# Patient Record
Sex: Female | Born: 1950
Health system: Southern US, Community
[De-identification: ages and names within clinical notes are randomized; demographics above are authoritative.]

## PROBLEM LIST (undated history)

## (undated) ENCOUNTER — Emergency Department (HOSPITAL_COMMUNITY): Discharge status: Absent without leave | Payer: Medicare HMO

## (undated) DIAGNOSIS — T8859XA Other complications of anesthesia, initial encounter: Secondary | ICD-10-CM

## (undated) DIAGNOSIS — J439 Emphysema, unspecified: Secondary | ICD-10-CM

## (undated) DIAGNOSIS — K219 Gastro-esophageal reflux disease without esophagitis: Secondary | ICD-10-CM

## (undated) DIAGNOSIS — C159 Malignant neoplasm of esophagus, unspecified: Secondary | ICD-10-CM

## (undated) DIAGNOSIS — R112 Nausea with vomiting, unspecified: Secondary | ICD-10-CM

## (undated) DIAGNOSIS — I2510 Atherosclerotic heart disease of native coronary artery without angina pectoris: Secondary | ICD-10-CM

## (undated) DIAGNOSIS — H269 Unspecified cataract: Secondary | ICD-10-CM

## (undated) DIAGNOSIS — K5790 Diverticulosis of intestine, part unspecified, without perforation or abscess without bleeding: Secondary | ICD-10-CM

## (undated) DIAGNOSIS — E785 Hyperlipidemia, unspecified: Secondary | ICD-10-CM

## (undated) DIAGNOSIS — J189 Pneumonia, unspecified organism: Secondary | ICD-10-CM

## (undated) DIAGNOSIS — G4730 Sleep apnea, unspecified: Secondary | ICD-10-CM

## (undated) DIAGNOSIS — K220 Achalasia of cardia: Secondary | ICD-10-CM

## (undated) DIAGNOSIS — J449 Chronic obstructive pulmonary disease, unspecified: Secondary | ICD-10-CM

## (undated) DIAGNOSIS — K648 Other hemorrhoids: Secondary | ICD-10-CM

## (undated) DIAGNOSIS — H409 Unspecified glaucoma: Secondary | ICD-10-CM

## (undated) DIAGNOSIS — T4145XA Adverse effect of unspecified anesthetic, initial encounter: Secondary | ICD-10-CM

## (undated) DIAGNOSIS — I10 Essential (primary) hypertension: Secondary | ICD-10-CM

## (undated) DIAGNOSIS — Z9889 Other specified postprocedural states: Secondary | ICD-10-CM

## (undated) DIAGNOSIS — E119 Type 2 diabetes mellitus without complications: Secondary | ICD-10-CM

## (undated) HISTORY — DX: Unspecified glaucoma: H40.9

## (undated) HISTORY — DX: Diverticulosis of intestine, part unspecified, without perforation or abscess without bleeding: K57.90

## (undated) HISTORY — DX: Malignant neoplasm of esophagus, unspecified: C15.9

## (undated) HISTORY — PX: BREAST BIOPSY: SHX20

## (undated) HISTORY — DX: Hyperlipidemia, unspecified: E78.5

## (undated) HISTORY — PX: ABDOMINAL HYSTERECTOMY: SHX81

## (undated) HISTORY — DX: Gastro-esophageal reflux disease without esophagitis: K21.9

## (undated) HISTORY — PX: BREAST LUMPECTOMY: SHX2

## (undated) HISTORY — DX: Achalasia of cardia: K22.0

## (undated) HISTORY — DX: Sleep apnea, unspecified: G47.30

## (undated) HISTORY — DX: Essential (primary) hypertension: I10

## (undated) HISTORY — DX: Other hemorrhoids: K64.8

## (undated) HISTORY — DX: Unspecified cataract: H26.9

## (undated) HISTORY — DX: Type 2 diabetes mellitus without complications: E11.9

## (undated) HISTORY — DX: Chronic obstructive pulmonary disease, unspecified: J44.9

## (undated) HISTORY — DX: Emphysema, unspecified: J43.9

---

## 2014-07-06 LAB — HM MAMMOGRAPHY: HM Mammogram: NORMAL (ref 0–4)

## 2014-12-28 LAB — FECAL OCCULT BLOOD, GUAIAC: FECAL OCCULT BLD: NEGATIVE

## 2015-03-02 LAB — PULMONARY FUNCTION TEST

## 2015-03-22 LAB — HM COLONOSCOPY

## 2016-08-19 LAB — HM DIABETES EYE EXAM

## 2016-11-21 ENCOUNTER — Encounter: Payer: Self-pay | Admitting: Family Medicine

## 2016-11-21 ENCOUNTER — Other Ambulatory Visit: Payer: Self-pay

## 2016-11-21 ENCOUNTER — Ambulatory Visit: Payer: Medicare HMO | Admitting: Family Medicine

## 2016-11-21 VITALS — BP 138/80 | HR 100 | Temp 97.4°F | Resp 16 | Ht 61.5 in | Wt 154.0 lb

## 2016-11-21 DIAGNOSIS — Z8719 Personal history of other diseases of the digestive system: Secondary | ICD-10-CM | POA: Insufficient documentation

## 2016-11-21 DIAGNOSIS — J449 Chronic obstructive pulmonary disease, unspecified: Secondary | ICD-10-CM | POA: Insufficient documentation

## 2016-11-21 DIAGNOSIS — E782 Mixed hyperlipidemia: Secondary | ICD-10-CM | POA: Insufficient documentation

## 2016-11-21 DIAGNOSIS — I10 Essential (primary) hypertension: Secondary | ICD-10-CM | POA: Diagnosis not present

## 2016-11-21 DIAGNOSIS — E1169 Type 2 diabetes mellitus with other specified complication: Secondary | ICD-10-CM | POA: Insufficient documentation

## 2016-11-21 DIAGNOSIS — E119 Type 2 diabetes mellitus without complications: Secondary | ICD-10-CM | POA: Diagnosis not present

## 2016-11-21 DIAGNOSIS — K2100 Gastro-esophageal reflux disease with esophagitis, without bleeding: Secondary | ICD-10-CM | POA: Insufficient documentation

## 2016-11-21 DIAGNOSIS — Z1239 Encounter for other screening for malignant neoplasm of breast: Secondary | ICD-10-CM

## 2016-11-21 DIAGNOSIS — E1159 Type 2 diabetes mellitus with other circulatory complications: Secondary | ICD-10-CM | POA: Insufficient documentation

## 2016-11-21 DIAGNOSIS — Z23 Encounter for immunization: Secondary | ICD-10-CM

## 2016-11-21 DIAGNOSIS — Z1231 Encounter for screening mammogram for malignant neoplasm of breast: Secondary | ICD-10-CM

## 2016-11-21 DIAGNOSIS — E785 Hyperlipidemia, unspecified: Secondary | ICD-10-CM | POA: Diagnosis not present

## 2016-11-21 DIAGNOSIS — H269 Unspecified cataract: Secondary | ICD-10-CM

## 2016-11-21 DIAGNOSIS — J431 Panlobular emphysema: Secondary | ICD-10-CM

## 2016-11-21 DIAGNOSIS — R69 Illness, unspecified: Secondary | ICD-10-CM | POA: Diagnosis not present

## 2016-11-21 DIAGNOSIS — J439 Emphysema, unspecified: Secondary | ICD-10-CM | POA: Insufficient documentation

## 2016-11-21 DIAGNOSIS — I152 Hypertension secondary to endocrine disorders: Secondary | ICD-10-CM | POA: Insufficient documentation

## 2016-11-21 HISTORY — DX: Unspecified cataract: H26.9

## 2016-11-21 MED ORDER — ALBUTEROL SULFATE (2.5 MG/3ML) 0.083% IN NEBU: 2.5000 mg | INHALATION_SOLUTION | Freq: Four times a day (QID) | RESPIRATORY_TRACT | 3 refills | Status: DC | PRN

## 2016-11-21 MED ORDER — METFORMIN HCL 1000 MG PO TABS: 1000.0000 mg | ORAL_TABLET | Freq: Two times a day (BID) | ORAL | 3 refills | Status: DC

## 2016-11-21 MED ORDER — LOSARTAN POTASSIUM 25 MG PO TABS: 25.0000 mg | ORAL_TABLET | Freq: Every day | ORAL | 3 refills | Status: DC

## 2016-11-21 MED ORDER — FLUTICASONE FUROATE-VILANTEROL 100-25 MCG/INH IN AEPB: 1.0000 | INHALATION_SPRAY | Freq: Every day | RESPIRATORY_TRACT | 3 refills | Status: DC

## 2016-11-21 MED ORDER — OMEPRAZOLE 20 MG PO CPDR: 20.0000 mg | DELAYED_RELEASE_CAPSULE | Freq: Two times a day (BID) | ORAL | 3 refills | Status: DC

## 2016-11-21 MED ORDER — DOXYCYCLINE HYCLATE 100 MG PO TABS: 100.0000 mg | ORAL_TABLET | Freq: Two times a day (BID) | ORAL | 1 refills | Status: DC

## 2016-11-21 MED ORDER — PREDNISONE 20 MG PO TABS: 20.0000 mg | ORAL_TABLET | Freq: Two times a day (BID) | ORAL | 0 refills | Status: DC

## 2016-11-21 MED ORDER — GLIMEPIRIDE 4 MG PO TABS: 4.0000 mg | ORAL_TABLET | Freq: Every day | ORAL | 3 refills | Status: DC

## 2016-11-21 NOTE — Patient Instructions (Signed)
Push fluids Take the 5 d of antibiotics and prednisone Medicines will be refilled to pharmacy Mammogram ordered Referred to Eye doctor and the GI doctor  Need old records  Need labs and follow up in a month Call sooner for problems

## 2016-11-21 NOTE — Progress Notes (Signed)
Chief Complaint  Patient presents with  . Hypertension   Erica Braun is a retired 66 year old woman here for her first visit.  She and her husband just moved to the area from [place].  She has multiple medical problems, and no medical records.  She is a fairly good historian. She is a type II diabetic.  On oral medications.  She states her diabetes control has been good.  She denies any diabetic eye disease, kidney disease, or neuropathy.  She is up-to-date with with eye exam.  She states her last blood work was in June. She has hyperlipidemia.  She is not on a statin.  She states her doctor tried one, but it made her have body aches.  She was in the process of getting a second prescription, but moved.  She is willing to try another statin medication.  We will get lab work and evaluate. She has hypertension.  She is on an ARB medication.  Her blood pressure is well controlled. She has COPD.  Notes from many years of smoking.  She quit smoking a while ago.  She is compliant with her inhalers.  She tries to walk every day.  She states her lung function has improved since quitting smoking. She currently has an upper respiratory infection.  Is been going on for about 2 weeks.  She has coughing and chest congestion.  She is coughing up some scant yellow mucus.  No runny or stuffy nose, sore throat, or head cold symptoms.  She has not had any fever.  She is feeling more tired. She does have a history of GERD.  She states that the "acid has scarred my esophagus".  She has had to have strictures dilated on more than one occasion.  She states she is not having any swallowing difficulties right now. Her mother died of breast cancer.  She has 1 sister with breast cancer.  She is usually compliant with yearly mammograms, this year her mammogram was due in October.  She missed it in the move.  I will order a mammogram for her today.  She had a DEXA scan last year that was normal.  She had a colonoscopy 2 years ago.  It  seems she is up-to-date with her health care.  We will update her flu shot today.  She states she has had more than one pneumonia vaccination.  Patient Active Problem List   Diagnosis Date Noted  . Type 2 diabetes mellitus without complication, without long-term current use of insulin ([place]) 11/21/2016  . GERD with esophagitis 11/21/2016  . History of esophageal stricture 11/21/2016  . Essential hypertension 11/21/2016  . Hyperlipidemia LDL goal <70 11/21/2016  . COPD with emphysema ([place]) 11/21/2016  . Early cataracts, bilateral 11/21/2016    Outpatient Encounter Medications as of 11/21/2016  Medication Sig  . albuterol (PROVENTIL) (2.5 MG/3ML) 0.083% nebulizer solution Take 2.5 mg every 6 (six) hours as needed by nebulization for wheezing or shortness of breath.  . fluticasone furoate-vilanterol (BREO ELLIPTA) 100-25 MCG/INH AEPB Inhale 1 puff daily into the lungs.  Marland Kitchen glimepiride (AMARYL) 4 MG tablet Take 4 mg daily with breakfast by mouth.  . losartan (COZAAR) 25 MG tablet Take 25 mg daily by mouth.  . metFORMIN (GLUCOPHAGE) 1000 MG tablet Take 1,000 mg 2 (two) times daily with a meal by mouth.  Marland Kitchen omeprazole (PRILOSEC) 20 MG capsule Take 20 mg 2 (two) times daily before a meal by mouth.  . doxycycline (VIBRA-TABS) 100 MG tablet Take 1  tablet (100 mg total) 2 (two) times daily by mouth.  . predniSONE (DELTASONE) 20 MG tablet Take 1 tablet (20 mg total) 2 (two) times daily with a meal by mouth.   No facility-administered encounter medications on file as of 11/21/2016.     Past Medical History:  Diagnosis Date  . COPD (chronic obstructive pulmonary disease) ([place])   . Diabetes mellitus without complication ([place])   . Early cataracts, bilateral 11/21/2016  . Emphysema of lung ([place])   . GERD (gastroesophageal reflux disease)   . Glaucoma   . Hyperlipidemia   . Hypertension   . Sleep apnea     Past Surgical History:  Procedure Laterality Date  . ABDOMINAL HYSTERECTOMY     cervical  dysplasia    Social History   Socioeconomic History  . Marital status: Married    Spouse name: Jenny Reichmann  . Number of children: 3  . Years of education: 16  . Highest education level: Not on file  Social Needs  . Financial resource strain: Not hard at all  . Food insecurity - worry: Never true  . Food insecurity - inability: Never true  . Transportation needs - medical: No  . Transportation needs - non-medical: No  Occupational History  . Occupation: retired    Comment: Advertising account planner  Tobacco Use  . Smoking status: Former Smoker    Packs/day: 1.50    Types: Cigarettes    Start date: 1969    Last attempt to quit: 2007    Years since quitting: 11.8  . Smokeless tobacco: Never Used  Substance and Sexual Activity  . Alcohol use: No    Frequency: Never  . Drug use: No  . Sexual activity: Yes    Birth control/protection: Post-menopausal  Other Topics Concern  . Not on file  Social History Narrative   Recent move from [place] to walk   Married to [employer] dog at home    Family History  Problem Relation Age of Onset  . Cancer Mother        breast  . Arthritis Mother   . COPD Father 64       emphysema  . Alcohol abuse Father   . Diabetes Father   . Cancer Sister        breast  . Early death Brother        overdose  . Drug abuse Brother   . Alcohol abuse Paternal Aunt   . COPD Paternal 34   . Cancer Maternal Grandmother   . Diabetes Maternal Grandmother   . Alcohol abuse Maternal Grandfather   . Early death Maternal Grandfather   . Stroke Paternal Grandmother   . Heart disease Paternal Grandfather     Review of Systems  Constitutional: Positive for malaise/fatigue. Negative for chills, fever and weight loss.  HENT: Negative for congestion and hearing loss.   Eyes: Negative for blurred vision and pain.  Respiratory: Positive for cough, sputum production and shortness of breath.   Cardiovascular: Negative for chest pain and leg swelling.    Gastrointestinal: Negative for abdominal pain, constipation, diarrhea and heartburn.  Genitourinary: Negative for dysuria and frequency.  Musculoskeletal: Negative for falls, joint pain and myalgias.  Neurological: Negative for dizziness, seizures and headaches.  Psychiatric/Behavioral: Negative for depression. The patient is not nervous/anxious and does not have insomnia.     BP 138/80 (BP Location: Right Arm, Patient Position: Sitting, Cuff Size: Normal)   Pulse 100   Temp Marland Kitchen)  97.4 F (36.3 C) (Temporal)   Resp 16   Ht 5' 1.5" (1.562 m)   Wt 154 lb 0.6 oz (69.9 kg)   SpO2 94%   BMI 28.63 kg/m   Physical Exam  Constitutional: She is oriented to person, place, and time. She appears well-developed and well-nourished.  HENT:  Head: Normocephalic and atraumatic.  Right Ear: External ear normal.  Left Ear: External ear normal.  Mouth/Throat: Oropharynx is clear and moist.  Eyes: Conjunctivae are normal. Pupils are equal, round, and reactive to light.  Neck: Normal range of motion. Neck supple. No thyromegaly present.  Cardiovascular: Normal rate, regular rhythm and normal heart sounds.  Pulmonary/Chest: Effort normal. No respiratory distress.  rhonchi  Abdominal: Soft. Bowel sounds are normal.  Musculoskeletal: Normal range of motion. She exhibits no edema.  Lymphadenopathy:    She has no cervical adenopathy.  Neurological: She is alert and oriented to person, place, and time.  Gait normal  Skin: Skin is warm and dry.  Psychiatric: She has a normal mood and affect. Her behavior is normal. Thought content normal.  Nursing note and vitals reviewed. ASSESSMENT/PLAN:   1. Type 2 diabetes mellitus without complication, without long-term current use of insulin (HCC) - CBC - COMPLETE METABOLIC PANEL WITH GFR - Hemoglobin A1c - Lipid panel - VITAMIN D 25 Hydroxy (Vit-D Deficiency, Fractures) - Microalbumin / creatinine urine ratio - Urinalysis, Routine w reflex microscopic -  Ambulatory referral to Ophthalmology  2. GERD with esophagitis  - Ambulatory referral to Gastroenterology  3. History of esophageal stricture  - Ambulatory referral to Gastroenterology  4. Essential hypertension   5. Hyperlipidemia LDL goal <70   6. Panlobular emphysema ([place]) With exacerbation  7. Screening for breast cancer  - MM Digital Screening; Future  8. Early cataracts, bilateral  - Ambulatory referral to Ophthalmology  9. Needs flu shot  - Flu Vaccine QUAD 36+ mos IM   Patient Instructions  Push fluids Take the 5 d of antibiotics and prednisone Medicines will be refilled to pharmacy Mammogram ordered Referred to Eye doctor and the GI doctor  Need old records  Need labs and follow up in a month Call sooner for problems   Raylene Everts, MD

## 2016-11-22 LAB — COMPLETE METABOLIC PANEL WITH GFR
AG Ratio: 1.8 (calc) (ref 1.0–2.5)
ALBUMIN MSPROF: 4.2 g/dL (ref 3.6–5.1)
ALKALINE PHOSPHATASE (APISO): 102 U/L (ref 33–130)
ALT: 12 U/L (ref 6–29)
AST: 11 U/L (ref 10–35)
BILIRUBIN TOTAL: 0.4 mg/dL (ref 0.2–1.2)
BUN: 12 mg/dL (ref 7–25)
CHLORIDE: 100 mmol/L (ref 98–110)
CO2: 31 mmol/L (ref 20–32)
CREATININE: 0.56 mg/dL (ref 0.50–0.99)
Calcium: 9.3 mg/dL (ref 8.6–10.4)
GFR, Est African American: 112 mL/min/{1.73_m2} (ref >=60)
GFR, Est Non African American: 96 mL/min/{1.73_m2} (ref >=60)
GLUCOSE: 129 mg/dL (ref 65–139)
Globulin: 2.4 g/dL (calc) (ref 1.9–3.7)
Potassium: 4.1 mmol/L (ref 3.5–5.3)
Sodium: 138 mmol/L (ref 135–146)
TOTAL PROTEIN: 6.6 g/dL (ref 6.1–8.1)

## 2016-11-22 LAB — MICROALBUMIN / CREATININE URINE RATIO
Creatinine, Urine: 34 mg/dL (ref 20–275)
MICROALB UR: 0.2 mg/dL
MICROALB/CREAT RATIO: 6 ug/mg{creat} (ref <30)

## 2016-11-22 LAB — LIPID PANEL
CHOL/HDL RATIO: 3.6 (calc) (ref <5.0)
CHOLESTEROL: 195 mg/dL (ref <200)
HDL: 54 mg/dL (ref >50)
LDL CHOLESTEROL (CALC): 112 mg/dL — AB
Non-HDL Cholesterol (Calc): 141 mg/dL (calc) — ABNORMAL HIGH (ref <130)
TRIGLYCERIDES: 163 mg/dL — AB (ref <150)

## 2016-11-22 LAB — URINALYSIS, ROUTINE W REFLEX MICROSCOPIC
BILIRUBIN URINE: NEGATIVE
GLUCOSE, UA: NEGATIVE
HGB URINE DIPSTICK: NEGATIVE
KETONES UR: NEGATIVE
Leukocytes, UA: NEGATIVE
Nitrite: NEGATIVE
PROTEIN: NEGATIVE
Specific Gravity, Urine: 1.007 (ref 1.001–1.03)
pH: 7 (ref 5.0–8.0)

## 2016-11-22 LAB — HEMOGLOBIN A1C
HEMOGLOBIN A1C: 6 %{Hb} — AB (ref <5.7)
MEAN PLASMA GLUCOSE: 126 (calc)
eAG (mmol/L): 7 (calc)

## 2016-11-22 LAB — CBC
HCT: 33.6 % — ABNORMAL LOW (ref 35.0–45.0)
HEMOGLOBIN: 10.9 g/dL — AB (ref 11.7–15.5)
MCH: 28.3 pg (ref 27.0–33.0)
MCHC: 32.4 g/dL (ref 32.0–36.0)
MCV: 87.3 fL (ref 80.0–100.0)
MPV: 11 fL (ref 7.5–12.5)
PLATELETS: 177 10*3/uL (ref 140–400)
RBC: 3.85 10*6/uL (ref 3.80–5.10)
RDW: 13.9 % (ref 11.0–15.0)
WBC: 4.5 10*3/uL (ref 3.8–10.8)

## 2016-11-22 LAB — VITAMIN D 25 HYDROXY (VIT D DEFICIENCY, FRACTURES): VIT D 25 HYDROXY: 16 ng/mL — AB (ref 30–100)

## 2016-11-24 ENCOUNTER — Other Ambulatory Visit: Payer: Self-pay | Admitting: Family Medicine

## 2016-11-24 ENCOUNTER — Encounter: Payer: Self-pay | Admitting: Family Medicine

## 2016-11-24 DIAGNOSIS — D649 Anemia, unspecified: Secondary | ICD-10-CM

## 2016-12-19 ENCOUNTER — Encounter: Payer: Self-pay | Admitting: Family Medicine

## 2016-12-19 ENCOUNTER — Ambulatory Visit: Payer: Medicare HMO | Admitting: Family Medicine

## 2016-12-19 ENCOUNTER — Other Ambulatory Visit: Payer: Self-pay

## 2016-12-19 VITALS — BP 144/80 | HR 96 | Temp 97.4°F | Resp 16 | Ht 62.0 in | Wt 153.1 lb

## 2016-12-19 DIAGNOSIS — D649 Anemia, unspecified: Secondary | ICD-10-CM

## 2016-12-19 DIAGNOSIS — E119 Type 2 diabetes mellitus without complications: Secondary | ICD-10-CM

## 2016-12-19 DIAGNOSIS — E785 Hyperlipidemia, unspecified: Secondary | ICD-10-CM | POA: Diagnosis not present

## 2016-12-19 DIAGNOSIS — Z9989 Dependence on other enabling machines and devices: Secondary | ICD-10-CM

## 2016-12-19 DIAGNOSIS — D509 Iron deficiency anemia, unspecified: Secondary | ICD-10-CM | POA: Diagnosis not present

## 2016-12-19 DIAGNOSIS — G4733 Obstructive sleep apnea (adult) (pediatric): Secondary | ICD-10-CM

## 2016-12-19 DIAGNOSIS — I10 Essential (primary) hypertension: Secondary | ICD-10-CM | POA: Diagnosis not present

## 2016-12-19 LAB — CBC WITH DIFFERENTIAL/PLATELET
BASOS ABS: 18 {cells}/uL (ref 0–200)
BASOS PCT: 0.4 %
EOS ABS: 69 {cells}/uL (ref 15–500)
EOS PCT: 1.5 %
HEMATOCRIT: 34 % — AB (ref 35.0–45.0)
HEMOGLOBIN: 11.3 g/dL — AB (ref 11.7–15.5)
LYMPHS ABS: 1049 {cells}/uL (ref 850–3900)
MCH: 28.6 pg (ref 27.0–33.0)
MCHC: 33.2 g/dL (ref 32.0–36.0)
MCV: 86.1 fL (ref 80.0–100.0)
MONOS PCT: 7.6 %
MPV: 11.6 fL (ref 7.5–12.5)
NEUTROS ABS: 3114 {cells}/uL (ref 1500–7800)
Neutrophils Relative %: 67.7 %
Platelets: 162 10*3/uL (ref 140–400)
RBC: 3.95 10*6/uL (ref 3.80–5.10)
RDW: 13.6 % (ref 11.0–15.0)
Total Lymphocyte: 22.8 %
WBC mixed population: 350 cells/uL (ref 200–950)
WBC: 4.6 10*3/uL (ref 3.8–10.8)

## 2016-12-19 MED ORDER — ATORVASTATIN CALCIUM 20 MG PO TABS: 20.0000 mg | ORAL_TABLET | Freq: Every day | ORAL | 3 refills | Status: DC

## 2016-12-19 NOTE — Progress Notes (Signed)
Chief Complaint  Patient presents with  . Follow-up    1 month  Patient is here for one-month follow-up. She tells me that she forgot to tell me at her last visit that she had obstructive sleep apnea and uses CPAP.  When she moved, her insurance changed, and her old insurance company took back her CPAP machine.  I need a copy of her sleep study so that I can reorder her CPAP. We discussed her recent laboratory test.  Her diabetes is well controlled.  Her blood pressure today is a little elevated.  She states that  she did not take her blood pressure medicine this morning.    She is not on a statin.  Her LDL is 112.  I let her know today that I believe she should be taking a cholesterol medication for risk reduction.  Liver and kidney functions were good. Her blood work did reveal anemia.  Her hemoglobin was just over 10.  She has not been anemic in the past.  She had a colonoscopy 2 or 3 years ago.  She had an endoscopy at the same time.  She does have a history of GERD and esophagitis.  Going to send her for additional studies to check her iron, check for blood in stool, recheck a CBC.  This needs additional workup.   Patient Active Problem List   Diagnosis Date Noted  . OSA on CPAP 12/19/2016  . Type 2 diabetes mellitus without complication, without long-term current use of insulin ([place]) 11/21/2016  . GERD with esophagitis 11/21/2016  . History of esophageal stricture 11/21/2016  . Essential hypertension 11/21/2016  . Hyperlipidemia LDL goal <70 11/21/2016  . COPD with emphysema ([place]) 11/21/2016  . Early cataracts, bilateral 11/21/2016    Outpatient Encounter Medications as of 12/19/2016  Medication Sig  . albuterol (PROVENTIL) (2.5 MG/3ML) 0.083% nebulizer solution Take 3 mLs (2.5 mg total) every 6 (six) hours as needed by nebulization for wheezing or shortness of breath.  . fluticasone furoate-vilanterol (BREO ELLIPTA) 100-25 MCG/INH AEPB Inhale 1 puff daily into the lungs.  Marland Kitchen  glimepiride (AMARYL) 4 MG tablet Take 1 tablet (4 mg total) daily with breakfast by mouth.  . losartan (COZAAR) 25 MG tablet Take 1 tablet (25 mg total) daily by mouth.  . metFORMIN (GLUCOPHAGE) 1000 MG tablet Take 1 tablet (1,000 mg total) 2 (two) times daily with a meal by mouth.  Marland Kitchen omeprazole (PRILOSEC) 20 MG capsule Take 1 capsule (20 mg total) 2 (two) times daily before a meal by mouth.   No facility-administered encounter medications on file as of 12/19/2016.     No Known Allergies  Review of Systems  Constitutional: Negative for activity change, appetite change and unexpected weight change.  HENT: Negative for congestion, dental problem, postnasal drip and rhinorrhea.   Eyes: Negative for redness and visual disturbance.  Respiratory: Negative for cough and shortness of breath.        Mild dyspnea is normal for her  Cardiovascular: Negative for chest pain, palpitations and leg swelling.  Gastrointestinal: Negative for abdominal pain, constipation and diarrhea.  Genitourinary: Negative for difficulty urinating and frequency.  Musculoskeletal: Negative for arthralgias and back pain.  Neurological: Negative for dizziness and headaches.  Psychiatric/Behavioral: Negative for dysphoric mood and sleep disturbance. The patient is not nervous/anxious.     BP (!) 144/80 (BP Location: Left Arm, Patient Position: Sitting, Cuff Size: Normal)   Pulse 96   Temp (!) 97.4 F (36.3 C) (Temporal)  Resp 16   Ht 5\' 2"  (1.575 m)   Wt 153 lb 1.9 oz (69.5 kg)   SpO2 95%   BMI 28.01 kg/m   Physical Exam  Constitutional: She is oriented to person, place, and time. She appears well-developed and well-nourished.  HENT:  Head: Normocephalic and atraumatic.  Mouth/Throat: Oropharynx is clear and moist.  Eyes: Conjunctivae are normal. Pupils are equal, round, and reactive to light.  Neck: Normal range of motion. Neck supple. No thyromegaly present.  Cardiovascular: Normal rate, regular rhythm and  normal heart sounds.  Pulmonary/Chest: Effort normal. No respiratory distress. She has no wheezes.  Abdominal: Soft. Bowel sounds are normal.  Musculoskeletal: Normal range of motion. She exhibits no edema.  Lymphadenopathy:    She has no cervical adenopathy.  Neurological: She is alert and oriented to person, place, and time.  Gait normal  Skin: Skin is warm and dry.  Psychiatric: She has a normal mood and affect. Her behavior is normal. Thought content normal.  Nursing note and vitals reviewed.   ASSESSMENT/PLAN:  1. Microcytic anemia Iron panel - CBC with Differential/Platelet - [place] Stool Card-TAKE HOME  2. Type 2 diabetes mellitus without complication, without long-term current use of insulin ([place]) Well-controlled  3. OSA on CPAP Need old records  4. Essential hypertension Patient did not take her blood pressure medicine today.  It is mildly elevated.  She is reminded to take it every morning. 5.  Hyperlipidemia. Prescription for Lipitor 20 sent to her pharmacy  Patient Instructions  Need copy of your old sleep test Still need old PCP records Mammogram ordered Continue the vitamin D Need additional labs today for the anemia follow up See me in three months I will let you know the lab tests sooner and any additional tests needed We will check for blood in the stool    Raylene Everts, MD

## 2016-12-19 NOTE — Patient Instructions (Addendum)
Need copy of your old sleep test Still need old PCP records Mammogram ordered Continue the vitamin D Need additional labs today for the anemia follow up See me in three months I will let you know the lab tests sooner and any additional tests needed We will check for blood in the stool

## 2016-12-23 ENCOUNTER — Telehealth: Payer: Self-pay

## 2016-12-23 NOTE — Progress Notes (Signed)
Called and left message for gage, she will need to return to the lab for additional lab work.

## 2016-12-23 NOTE — Telephone Encounter (Signed)
-----   Message from Raylene Everts, MD sent at 12/23/2016 11:11 AM EST ----- The lab did it again, only the tests ordered that visit and not the ones I signed a few days earlier.  I NEED both the iron studies, retic count AND the CBC.  Please call and see if lab can add.

## 2016-12-24 DIAGNOSIS — D649 Anemia, unspecified: Secondary | ICD-10-CM | POA: Diagnosis not present

## 2016-12-24 NOTE — Addendum Note (Signed)
Addended by: Ova Freshwater on: 12/24/2016 11:56 AM   Modules accepted: Orders

## 2016-12-24 NOTE — Telephone Encounter (Signed)
Pt is in the waiting room

## 2016-12-25 LAB — IRON,TIBC AND FERRITIN PANEL
%SAT: 27 % (ref 11–50)
Ferritin: 36 ng/mL (ref 20–288)
Iron: 85 ug/dL (ref 45–160)
TIBC: 313 mcg/dL (calc) (ref 250–450)

## 2016-12-25 LAB — RETICULOCYTES
ABS RETIC: 88660 {cells}/uL — AB (ref 20000–8000)
RETIC CT PCT: 2.2 %

## 2016-12-25 NOTE — Progress Notes (Signed)
Labs drawn 12 19 18

## 2016-12-29 DIAGNOSIS — R69 Illness, unspecified: Secondary | ICD-10-CM | POA: Diagnosis not present

## 2017-01-02 ENCOUNTER — Encounter: Payer: Self-pay | Admitting: Family Medicine

## 2017-01-07 ENCOUNTER — Other Ambulatory Visit: Payer: Self-pay

## 2017-03-06 ENCOUNTER — Encounter: Payer: Self-pay | Admitting: Family Medicine

## 2017-03-09 ENCOUNTER — Other Ambulatory Visit: Payer: Self-pay

## 2017-03-09 DIAGNOSIS — Z1239 Encounter for other screening for malignant neoplasm of breast: Secondary | ICD-10-CM

## 2017-03-17 DIAGNOSIS — H5203 Hypermetropia, bilateral: Secondary | ICD-10-CM | POA: Diagnosis not present

## 2017-03-17 DIAGNOSIS — H52223 Regular astigmatism, bilateral: Secondary | ICD-10-CM | POA: Diagnosis not present

## 2017-03-17 DIAGNOSIS — H2513 Age-related nuclear cataract, bilateral: Secondary | ICD-10-CM | POA: Diagnosis not present

## 2017-03-17 DIAGNOSIS — H524 Presbyopia: Secondary | ICD-10-CM | POA: Diagnosis not present

## 2017-03-17 DIAGNOSIS — E119 Type 2 diabetes mellitus without complications: Secondary | ICD-10-CM | POA: Diagnosis not present

## 2017-03-17 DIAGNOSIS — H40013 Open angle with borderline findings, low risk, bilateral: Secondary | ICD-10-CM | POA: Diagnosis not present

## 2017-03-17 DIAGNOSIS — H25013 Cortical age-related cataract, bilateral: Secondary | ICD-10-CM | POA: Diagnosis not present

## 2017-03-17 DIAGNOSIS — Z7984 Long term (current) use of oral hypoglycemic drugs: Secondary | ICD-10-CM | POA: Diagnosis not present

## 2017-03-18 ENCOUNTER — Encounter: Payer: Self-pay | Admitting: Family Medicine

## 2017-03-18 ENCOUNTER — Other Ambulatory Visit: Payer: Self-pay

## 2017-03-18 ENCOUNTER — Ambulatory Visit (INDEPENDENT_AMBULATORY_CARE_PROVIDER_SITE_OTHER): Payer: Medicare HMO | Admitting: Family Medicine

## 2017-03-18 VITALS — BP 132/82 | HR 106 | Temp 97.9°F | Ht 62.0 in | Wt 156.2 lb

## 2017-03-18 DIAGNOSIS — J431 Panlobular emphysema: Secondary | ICD-10-CM | POA: Diagnosis not present

## 2017-03-18 DIAGNOSIS — Z23 Encounter for immunization: Secondary | ICD-10-CM

## 2017-03-18 DIAGNOSIS — Z9989 Dependence on other enabling machines and devices: Secondary | ICD-10-CM

## 2017-03-18 DIAGNOSIS — I10 Essential (primary) hypertension: Secondary | ICD-10-CM

## 2017-03-18 DIAGNOSIS — E785 Hyperlipidemia, unspecified: Secondary | ICD-10-CM | POA: Diagnosis not present

## 2017-03-18 DIAGNOSIS — E119 Type 2 diabetes mellitus without complications: Secondary | ICD-10-CM | POA: Diagnosis not present

## 2017-03-18 DIAGNOSIS — K2100 Gastro-esophageal reflux disease with esophagitis, without bleeding: Secondary | ICD-10-CM

## 2017-03-18 DIAGNOSIS — G4733 Obstructive sleep apnea (adult) (pediatric): Secondary | ICD-10-CM

## 2017-03-18 MED ORDER — PNEUMOCOCCAL 13-VAL CONJ VACC IM SUSP: 0.5000 mL | Freq: Once | INTRAMUSCULAR | Status: DC

## 2017-03-18 NOTE — Progress Notes (Signed)
Chief Complaint  Patient presents with  . Follow-up    cpap questions, GI appointment next week, burning in chest  Patient questions the status of her CPAP supplies.  I told her I still do not have the necessary sleep study information in order to submit this to insurance.  She states she will call again to see if she can get a copy of this. She does have a GI appointment next week.  She has a history of esophagitis and esophageal stricture.  She was found on her recent blood work to have anemia.  I feel she needs another endoscopy.  Her colonoscopy is up-to-date.  She has not noticed any change in her appetite, GI distress, blood in her bowels, constipation or diarrhea.  She states she still has moderate difficulty swallowing at times.  She feels this is normal for her. Patient has COPD.  She has GERD.  She has some "burning" in her chest.  She states she feels this when she "runs up the stairs fast".  She does not think it is cardiac.  She does not have any associated symptoms, radiation, nausea, diaphoresis.  She states it feels "like I did when I used to run when I was younger.  She is going to have a GI workup soon.  I did an EKG today that is negative.  I did explain to her that if it persists we might need to do additional lung or GI testing. She is compliant with her Brio.  She does not have shortness of breath, cough or sputum that is worrisome. Her diabetes is well controlled. Blood pressures well controlled. She is compliant with her statin. She is overdue for her mammogram. She states she is certain her shots are up-to-date including Prevnar/Pneumovax She has not had shingles shot.  She is not interested in it.  Discussed the risks benefits pros and cons.  Patient Active Problem List   Diagnosis Date Noted  . OSA on CPAP 12/19/2016  . Type 2 diabetes mellitus without complication, without long-term current use of insulin ([place]) 11/21/2016  . GERD with esophagitis 11/21/2016  .  History of esophageal stricture 11/21/2016  . Essential hypertension 11/21/2016  . Hyperlipidemia LDL goal <70 11/21/2016  . COPD with emphysema ([place]) 11/21/2016  . Early cataracts, bilateral 11/21/2016    Outpatient Encounter Medications as of 03/18/2017  Medication Sig  . albuterol (PROVENTIL) (2.5 MG/3ML) 0.083% nebulizer solution Take 3 mLs (2.5 mg total) every 6 (six) hours as needed by nebulization for wheezing or shortness of breath.  Marland Kitchen atorvastatin (LIPITOR) 20 MG tablet Take 1 tablet (20 mg total) by mouth daily.  . fluticasone furoate-vilanterol (BREO ELLIPTA) 100-25 MCG/INH AEPB Inhale 1 puff daily into the lungs.  Marland Kitchen glimepiride (AMARYL) 4 MG tablet Take 1 tablet (4 mg total) daily with breakfast by mouth.  . metFORMIN (GLUCOPHAGE) 1000 MG tablet Take 1 tablet (1,000 mg total) 2 (two) times daily with a meal by mouth.  Marland Kitchen omeprazole (PRILOSEC) 20 MG capsule Take 1 capsule (20 mg total) 2 (two) times daily before a meal by mouth.  . losartan (COZAAR) 25 MG tablet Take 1 tablet (25 mg total) daily by mouth. (Patient not taking: Reported on 03/18/2017)   No facility-administered encounter medications on file as of 03/18/2017.     No Known Allergies  Review of Systems  Constitutional: Negative for activity change, appetite change and unexpected weight change.  HENT: Negative for congestion, dental problem, postnasal drip and rhinorrhea.  Eyes: Negative for redness and visual disturbance.  Respiratory: Negative for cough and shortness of breath.        Mild dyspnea is normal for her.  No change in symptoms.  Chest burning with increased exertion.  Cardiovascular: Negative for chest pain, palpitations and leg swelling.  Gastrointestinal: Negative for abdominal pain, constipation and diarrhea.  Genitourinary: Negative for difficulty urinating and frequency.  Musculoskeletal: Negative for arthralgias and back pain.  Neurological: Negative for dizziness and headaches.    Psychiatric/Behavioral: Positive for sleep disturbance. Negative for dysphoric mood. The patient is not nervous/anxious.     BP 132/82 (BP Location: Right Arm, Patient Position: Sitting, Cuff Size: Normal)   Pulse (!) 106   Temp 97.9 F (36.6 C) (Temporal)   Ht 5\' 2"  (1.575 m)   Wt 156 lb 4 oz (70.9 kg)   SpO2 96%   BMI 28.58 kg/m   Physical Exam  Constitutional: She is oriented to person, place, and time. She appears well-developed and well-nourished.  HENT:  Head: Normocephalic and atraumatic.  Mouth/Throat: Oropharynx is clear and moist.  Dentures  Eyes: Conjunctivae are normal. Pupils are equal, round, and reactive to light.  Neck: Normal range of motion. Neck supple. No thyromegaly present.  Cardiovascular: Normal rate, regular rhythm and normal heart sounds.  Pulmonary/Chest: Effort normal. No respiratory distress. She has no wheezes.  Clear  Abdominal: Soft. Bowel sounds are normal.  Musculoskeletal: Normal range of motion. She exhibits no edema.  Lymphadenopathy:    She has no cervical adenopathy.  Neurological: She is alert and oriented to person, place, and time.  Gait normal  Skin: Skin is warm and dry.  Psychiatric: She has a normal mood and affect. Her behavior is normal. Thought content normal.  Nursing note and vitals reviewed.   ASSESSMENT/PLAN:   1. OSA on CPAP Currently untreated.  Awaiting sleep study results.  If these do not arrive within a reasonable period of time we will get another sleep study.  2. Essential hypertension Controlled  3. Panlobular emphysema ([facility]) Stable.  Quit smoking 12 years ago  4. GERD with esophagitis Still with difficulty swallowing.  No anemia.  Due for GI workup.  5. Type 2 diabetes mellitus without complication, without long-term current use of insulin ([facility]) Controlled  6. Hyperlipidemia LDL goal <70 Compliant   Patient Instructions  Need the sleep study  from prior  I will follow the GI work up  Let me  know if the chest pressure worsens  See me in 4-6 weeks   Raylene Everts, MD

## 2017-03-18 NOTE — Patient Instructions (Addendum)
Need the sleep study  from prior  I will follow the GI work up  Let me know if the chest pressure worsens  See me in 4-6 weeks

## 2017-03-19 ENCOUNTER — Ambulatory Visit: Payer: Medicare HMO | Admitting: Family Medicine

## 2017-03-23 ENCOUNTER — Encounter (HOSPITAL_COMMUNITY): Payer: Self-pay | Admitting: Radiology

## 2017-03-23 ENCOUNTER — Ambulatory Visit (HOSPITAL_COMMUNITY)
Admission: RE | Admit: 2017-03-23 | Discharge: 2017-03-23 | Disposition: A | Discharge status: Home / Self Care | Payer: Medicare HMO | Source: Ambulatory Visit | Attending: Family Medicine | Admitting: Family Medicine

## 2017-03-23 DIAGNOSIS — Z1239 Encounter for other screening for malignant neoplasm of breast: Secondary | ICD-10-CM

## 2017-03-23 DIAGNOSIS — Z1231 Encounter for screening mammogram for malignant neoplasm of breast: Secondary | ICD-10-CM | POA: Insufficient documentation

## 2017-03-29 DIAGNOSIS — R69 Illness, unspecified: Secondary | ICD-10-CM | POA: Diagnosis not present

## 2017-03-31 ENCOUNTER — Ambulatory Visit (INDEPENDENT_AMBULATORY_CARE_PROVIDER_SITE_OTHER): Payer: Medicare HMO | Admitting: Internal Medicine

## 2017-03-31 ENCOUNTER — Encounter (INDEPENDENT_AMBULATORY_CARE_PROVIDER_SITE_OTHER): Payer: Self-pay | Admitting: *Deleted

## 2017-03-31 ENCOUNTER — Encounter (INDEPENDENT_AMBULATORY_CARE_PROVIDER_SITE_OTHER): Payer: Self-pay | Admitting: Internal Medicine

## 2017-03-31 VITALS — BP 136/76 | HR 88 | Ht 62.0 in | Wt 155.6 lb

## 2017-03-31 DIAGNOSIS — R1310 Dysphagia, unspecified: Secondary | ICD-10-CM | POA: Diagnosis not present

## 2017-03-31 DIAGNOSIS — D508 Other iron deficiency anemias: Secondary | ICD-10-CM | POA: Diagnosis not present

## 2017-03-31 DIAGNOSIS — R1319 Other dysphagia: Secondary | ICD-10-CM

## 2017-03-31 LAB — CBC WITH DIFFERENTIAL/PLATELET
BASOS PCT: 0.7 %
Basophils Absolute: 21 cells/uL (ref 0–200)
EOS ABS: 81 {cells}/uL (ref 15–500)
Eosinophils Relative: 2.7 %
HEMATOCRIT: 34.7 % — AB (ref 35.0–45.0)
HEMOGLOBIN: 11.4 g/dL — AB (ref 11.7–15.5)
LYMPHS ABS: 798 {cells}/uL — AB (ref 850–3900)
MCH: 28.6 pg (ref 27.0–33.0)
MCHC: 32.9 g/dL (ref 32.0–36.0)
MCV: 87 fL (ref 80.0–100.0)
MPV: 11.2 fL (ref 7.5–12.5)
Monocytes Relative: 7.7 %
NEUTROS ABS: 1869 {cells}/uL (ref 1500–7800)
Neutrophils Relative %: 62.3 %
PLATELETS: 132 10*3/uL — AB (ref 140–400)
RBC: 3.99 10*6/uL (ref 3.80–5.10)
RDW: 13.2 % (ref 11.0–15.0)
Total Lymphocyte: 26.6 %
WBC: 3 10*3/uL — AB (ref 3.8–10.8)
WBCMIX: 231 {cells}/uL (ref 200–950)

## 2017-03-31 NOTE — Patient Instructions (Signed)
DG esophagram.   

## 2017-03-31 NOTE — Progress Notes (Signed)
Subjective:    Patient ID: Erica Braun, female    DOB: [birth year], 67 y.o.   MRN: [id number]  HPI Referred by Dr. Meda Coffee for GERD/hx of esophageal stricture. She tells me she has been choking on foods. She says 3 yrs ago she underwent an EGD/ED for same. She says she had an esophageal stricture. She says liquids give her a problem. All foods can cause symptoms of dysphagia. Usually occurs when she is stressed more. Her appetite is good. No weight loss.  BMs are normal. No melena or BRRB.  No NSAIDs.  Takes one baby ASA at night.   Her last colonoscopy was in 2017 in [place] (screening) which revealed internal hemorrhoids, diverticulosis in sigmoid colon, rest of colon was normal. (scannned in epic).   CBC    Component Value Date/Time   WBC 4.6 12/19/2016 1307   RBC 3.95 12/19/2016 1307   HGB 11.3 (L) 12/19/2016 1307   HCT 34.0 (L) 12/19/2016 1307   PLT 162 12/19/2016 1307   MCV 86.1 12/19/2016 1307   MCH 28.6 12/19/2016 1307   MCHC 33.2 12/19/2016 1307   RDW 13.6 12/19/2016 1307   LYMPHSABS 1,049 12/19/2016 1307   EOSABS 69 12/19/2016 1307   BASOSABS 18 12/19/2016 1307   Iron/TIBC/Ferritin/ %Sat    Component Value Date/Time   IRON 85 12/24/2016 1152   TIBC 313 12/24/2016 1152   FERRITIN 36 12/24/2016 1152   IRONPCTSAT 27 12/24/2016 1152       Review of Systems Past Medical History:  Diagnosis Date  . COPD (chronic obstructive pulmonary disease) ([place])   . Diabetes mellitus without complication ([place])   . Early cataracts, bilateral 11/21/2016  . Emphysema of lung ([place])   . GERD (gastroesophageal reflux disease)   . Glaucoma   . Hyperlipidemia   . Hypertension   . Sleep apnea     Past Surgical History:  Procedure Laterality Date  . ABDOMINAL HYSTERECTOMY     cervical dysplasia  . BREAST BIOPSY Left     No Known Allergies  Current Outpatient Medications on File Prior to Visit  Medication Sig Dispense Refill  . albuterol (PROVENTIL) (2.5 MG/3ML)  0.083% nebulizer solution Take 3 mLs (2.5 mg total) every 6 (six) hours as needed by nebulization for wheezing or shortness of breath. 75 mL 3  . atorvastatin (LIPITOR) 20 MG tablet Take 1 tablet (20 mg total) by mouth daily. 90 tablet 3  . fluticasone furoate-vilanterol (BREO ELLIPTA) 100-25 MCG/INH AEPB Inhale 1 puff daily into the lungs. 3 each 3  . glimepiride (AMARYL) 4 MG tablet Take 1 tablet (4 mg total) daily with breakfast by mouth. 90 tablet 3  . losartan (COZAAR) 25 MG tablet Take 1 tablet (25 mg total) daily by mouth. 90 tablet 3  . metFORMIN (GLUCOPHAGE) 1000 MG tablet Take 1 tablet (1,000 mg total) 2 (two) times daily with a meal by mouth. 180 tablet 3  . omeprazole (PRILOSEC) 20 MG capsule Take 1 capsule (20 mg total) 2 (two) times daily before a meal by mouth. 180 capsule 3   No current facility-administered medications on file prior to visit.         Objective:   Physical Exam Blood pressure 136/76, pulse 88, height 5\' 2"  (1.575 m), weight 155 lb 9.6 oz (70.6 kg). Alert and oriented. Skin warm and dry. Oral mucosa is moist.   . Sclera anicteric, conjunctivae is pink. Thyroid not enlarged. No cervical lymphadenopathy. Lungs clear. Heart regular rate and rhythm.  Abdomen  is soft. Bowel sounds are positive. No hepatomegaly. No abdominal masses felt. No tenderness.  No edema to lower extremities.           Assessment & Plan:  Esophageal dysphagia. Am going to get a DG esophagram. Continue the Omeprazole BID

## 2017-03-31 NOTE — Progress Notes (Signed)
Past Medical History:  Diagnosis Date  . COPD (chronic obstructive pulmonary disease) ([place])   . Diabetes mellitus without complication ([place])   . Early cataracts, bilateral 11/21/2016  . Emphysema of lung ([place])   . GERD (gastroesophageal reflux disease)   . Glaucoma   . Hyperlipidemia   . Hypertension   . Sleep apnea     Past Surgical History:  Procedure Laterality Date  . ABDOMINAL HYSTERECTOMY     cervical dysplasia  . BREAST BIOPSY Left     No Known Allergies  Current Outpatient Medications on File Prior to Visit  Medication Sig Dispense Refill  . albuterol (PROVENTIL) (2.5 MG/3ML) 0.083% nebulizer solution Take 3 mLs (2.5 mg total) every 6 (six) hours as needed by nebulization for wheezing or shortness of breath. 75 mL 3  . aspirin EC 81 MG tablet Take 81 mg by mouth daily.    Marland Kitchen atorvastatin (LIPITOR) 20 MG tablet Take 1 tablet (20 mg total) by mouth daily. 90 tablet 3  . fluticasone furoate-vilanterol (BREO ELLIPTA) 100-25 MCG/INH AEPB Inhale 1 puff daily into the lungs. 3 each 3  . glimepiride (AMARYL) 4 MG tablet Take 1 tablet (4 mg total) daily with breakfast by mouth. 90 tablet 3  . losartan (COZAAR) 25 MG tablet Take 1 tablet (25 mg total) daily by mouth. 90 tablet 3  . metFORMIN (GLUCOPHAGE) 1000 MG tablet Take 1 tablet (1,000 mg total) 2 (two) times daily with a meal by mouth. 180 tablet 3  . omeprazole (PRILOSEC) 20 MG capsule Take 1 capsule (20 mg total) 2 (two) times daily before a meal by mouth. 180 capsule 3   No current facility-administered medications on file prior to visit.

## 2017-04-02 ENCOUNTER — Encounter (INDEPENDENT_AMBULATORY_CARE_PROVIDER_SITE_OTHER): Payer: Self-pay | Admitting: *Deleted

## 2017-04-02 ENCOUNTER — Other Ambulatory Visit (INDEPENDENT_AMBULATORY_CARE_PROVIDER_SITE_OTHER): Payer: Self-pay | Admitting: *Deleted

## 2017-04-02 DIAGNOSIS — D508 Other iron deficiency anemias: Secondary | ICD-10-CM

## 2017-04-03 ENCOUNTER — Ambulatory Visit (HOSPITAL_COMMUNITY)
Admission: RE | Admit: 2017-04-03 | Discharge: 2017-04-03 | Disposition: A | Discharge status: Home / Self Care | Payer: Medicare HMO | Source: Ambulatory Visit | Attending: Internal Medicine | Admitting: Internal Medicine

## 2017-04-03 DIAGNOSIS — R1319 Other dysphagia: Secondary | ICD-10-CM

## 2017-04-03 DIAGNOSIS — R1310 Dysphagia, unspecified: Secondary | ICD-10-CM | POA: Insufficient documentation

## 2017-04-06 ENCOUNTER — Other Ambulatory Visit: Payer: Self-pay

## 2017-04-06 ENCOUNTER — Ambulatory Visit (INDEPENDENT_AMBULATORY_CARE_PROVIDER_SITE_OTHER): Payer: Medicare HMO | Admitting: Family Medicine

## 2017-04-06 ENCOUNTER — Encounter: Payer: Self-pay | Admitting: Family Medicine

## 2017-04-06 VITALS — BP 124/64 | HR 79 | Temp 98.7°F | Resp 12 | Ht 62.0 in | Wt 158.1 lb

## 2017-04-06 DIAGNOSIS — J431 Panlobular emphysema: Secondary | ICD-10-CM

## 2017-04-06 DIAGNOSIS — Z9989 Dependence on other enabling machines and devices: Secondary | ICD-10-CM

## 2017-04-06 DIAGNOSIS — E119 Type 2 diabetes mellitus without complications: Secondary | ICD-10-CM | POA: Diagnosis not present

## 2017-04-06 DIAGNOSIS — R079 Chest pain, unspecified: Secondary | ICD-10-CM | POA: Diagnosis not present

## 2017-04-06 DIAGNOSIS — I10 Essential (primary) hypertension: Secondary | ICD-10-CM

## 2017-04-06 DIAGNOSIS — G4733 Obstructive sleep apnea (adult) (pediatric): Secondary | ICD-10-CM

## 2017-04-06 NOTE — Patient Instructions (Signed)
I am referring you to the sleep center Dr Isidoro Donning  I am referring you to cardiology for stress testing  No change in medicine  Follow up in a couple of months  You will see Dr Mannie Stabile next time

## 2017-04-06 NOTE — Progress Notes (Signed)
Chief Complaint  Patient presents with  . Follow-up    testing?   Patient is here for follow-up. She went to her gastroenterology appointment and had an esophagram.  These are normal. She continues to have substernal chest pain that is exertional.  Sometimes she will break into a sweat.  It is in the upper sternum, just below her sternal notch and not typically in the middle of her chest like cardiac.  In spite of this, with a negative GI workup and no other logical answer I think a cardiac workup is reasonable. Patient still does not have her CPAP.  She wakes up with a headache.  She knows she has obstructive sleep apnea.  Were having difficulty getting her old sleep study.  Her husband saw a sleep specialist on [address].  She would like a referral to the same doctor.  She identifies Dr. Elenore Rota, and a referral is placed. She states she is eating well and sleeping well.  She is trying to stay active. She is well controlled diabetes.  Her hemoglobin A1c in November was 6 She has well-controlled hypertension.  She is compliant with medication  Patient Active Problem List   Diagnosis Date Noted  . OSA on CPAP 12/19/2016  . Type 2 diabetes mellitus without complication, without long-term current use of insulin ([place]) 11/21/2016  . GERD with esophagitis 11/21/2016  . History of esophageal stricture 11/21/2016  . Essential hypertension 11/21/2016  . Hyperlipidemia LDL goal <70 11/21/2016  . COPD with emphysema ([place]) 11/21/2016  . Early cataracts, bilateral 11/21/2016    Outpatient Encounter Medications as of 04/06/2017  Medication Sig  . albuterol (PROVENTIL) (2.5 MG/3ML) 0.083% nebulizer solution Take 3 mLs (2.5 mg total) every 6 (six) hours as needed by nebulization for wheezing or shortness of breath.  Marland Kitchen aspirin EC 81 MG tablet Take 81 mg by mouth daily.  Marland Kitchen atorvastatin (LIPITOR) 20 MG tablet Take 1 tablet (20 mg total) by mouth daily.  . fluticasone furoate-vilanterol (BREO ELLIPTA)  100-25 MCG/INH AEPB Inhale 1 puff daily into the lungs.  Marland Kitchen glimepiride (AMARYL) 4 MG tablet Take 1 tablet (4 mg total) daily with breakfast by mouth.  . losartan (COZAAR) 25 MG tablet Take 1 tablet (25 mg total) daily by mouth.  . metFORMIN (GLUCOPHAGE) 1000 MG tablet Take 1 tablet (1,000 mg total) 2 (two) times daily with a meal by mouth.  Marland Kitchen omeprazole (PRILOSEC) 20 MG capsule Take 1 capsule (20 mg total) 2 (two) times daily before a meal by mouth.   No facility-administered encounter medications on file as of 04/06/2017.     No Known Allergies  Review of Systems  Constitutional: Negative for activity change, appetite change and unexpected weight change.  HENT: Negative for congestion, dental problem, postnasal drip and rhinorrhea.   Eyes: Negative for redness and visual disturbance.  Respiratory: Negative for cough and shortness of breath.        Mild dyspnea is normal for her.  No change in symptoms.  Chest burning with increased exertion.  Cardiovascular: Positive for chest pain. Negative for palpitations and leg swelling.       Burning sensation upper sternal with exercise  Gastrointestinal: Negative for abdominal pain, constipation and diarrhea.  Genitourinary: Negative for difficulty urinating and frequency.  Musculoskeletal: Negative for arthralgias and back pain.  Neurological: Negative for dizziness and headaches.  Psychiatric/Behavioral: Positive for sleep disturbance. Negative for dysphoric mood. The patient is not nervous/anxious.     BP 124/64   Pulse  79   Temp 98.7 F (37.1 C)   Resp 12   Ht 5\' 2"  (1.575 m)   Wt 158 lb 1.9 oz (71.7 kg)   SpO2 97%   BMI 28.92 kg/m   Physical Exam  Constitutional: She is oriented to person, place, and time. She appears well-developed and well-nourished.  HENT:  Head: Normocephalic and atraumatic.  Mouth/Throat: Oropharynx is clear and moist.  Dentures  Eyes: Pupils are equal, round, and reactive to light. Conjunctivae are normal.   Neck: Normal range of motion. Neck supple. No thyromegaly present.  Cardiovascular: Normal rate, regular rhythm and normal heart sounds.  No murmur heard. Pulmonary/Chest: Effort normal. No respiratory distress. She has no wheezes.  Clear  Abdominal: Soft. Bowel sounds are normal.  Musculoskeletal: Normal range of motion. She exhibits no edema.  Lymphadenopathy:    She has no cervical adenopathy.  Neurological: She is alert and oriented to person, place, and time.  Gait normal  Skin: Skin is warm and dry.  Psychiatric: She has a normal mood and affect. Her behavior is normal. Thought content normal.  Nursing note and vitals reviewed.   ASSESSMENT/PLAN:  1. Exertional chest pain Atypical - Ambulatory referral to Cardiology  2. OSA on CPAP Untreated - Ambulatory referral to Sleep Studies  3. Panlobular emphysema (HCC) Stable  4. Essential hypertension Well-controlled  5. Type 2 diabetes mellitus without complication, without long-term current use of insulin ([place]) Well-controlled   Patient Instructions  I am referring you to the sleep center Dr Isidoro Donning  I am referring you to cardiology for stress testing  No change in medicine  Follow up in a couple of months  You will see Dr Mannie Stabile next time     Raylene Everts, MD

## 2017-04-27 ENCOUNTER — Ambulatory Visit: Payer: Medicare HMO | Admitting: Cardiology

## 2017-04-27 NOTE — Progress Notes (Deleted)
Clinical Summary [name] is a 67 y.o.female seen as new consult  1. Chest pain   - negative GI evaluation Past Medical History:  Diagnosis Date  . COPD (chronic obstructive pulmonary disease) ([place])   . Diabetes mellitus without complication ([place])   . Early cataracts, bilateral 11/21/2016  . Emphysema of lung ([place])   . GERD (gastroesophageal reflux disease)   . Glaucoma   . Hyperlipidemia   . Hypertension   . Sleep apnea      No Known Allergies   Current Outpatient Medications  Medication Sig Dispense Refill  . albuterol (PROVENTIL) (2.5 MG/3ML) 0.083% nebulizer solution Take 3 mLs (2.5 mg total) every 6 (six) hours as needed by nebulization for wheezing or shortness of breath. 75 mL 3  . aspirin EC 81 MG tablet Take 81 mg by mouth daily.    Marland Kitchen atorvastatin (LIPITOR) 20 MG tablet Take 1 tablet (20 mg total) by mouth daily. 90 tablet 3  . fluticasone furoate-vilanterol (BREO ELLIPTA) 100-25 MCG/INH AEPB Inhale 1 puff daily into the lungs. 3 each 3  . glimepiride (AMARYL) 4 MG tablet Take 1 tablet (4 mg total) daily with breakfast by mouth. 90 tablet 3  . losartan (COZAAR) 25 MG tablet Take 1 tablet (25 mg total) daily by mouth. 90 tablet 3  . metFORMIN (GLUCOPHAGE) 1000 MG tablet Take 1 tablet (1,000 mg total) 2 (two) times daily with a meal by mouth. 180 tablet 3  . omeprazole (PRILOSEC) 20 MG capsule Take 1 capsule (20 mg total) 2 (two) times daily before a meal by mouth. 180 capsule 3   No current facility-administered medications for this visit.      Past Surgical History:  Procedure Laterality Date  . ABDOMINAL HYSTERECTOMY     cervical dysplasia  . BREAST BIOPSY Left      No Known Allergies    Family History  Problem Relation Age of Onset  . Cancer Mother        breast  . Arthritis Mother   . COPD Father 79       emphysema  . Alcohol abuse Father   . Diabetes Father   . Cancer Sister        breast  . Early death Brother        overdose    . Drug abuse Brother   . Alcohol abuse Paternal Aunt   . COPD Paternal 25   . Cancer Maternal Grandmother   . Diabetes Maternal Grandmother   . Alcohol abuse Maternal Grandfather   . Early death Maternal Grandfather   . Stroke Paternal Grandmother   . Heart disease Paternal Grandfather      Social History [name] reports that she quit smoking about 12 years ago. Her smoking use included cigarettes. She started smoking about 50 years ago. She smoked 1.50 packs per day. She has never used smokeless tobacco. [name] reports that she does not drink alcohol.   Review of Systems CONSTITUTIONAL: No weight loss, fever, chills, weakness or fatigue.  HEENT: Eyes: No visual loss, blurred vision, double vision or yellow sclerae.No hearing loss, sneezing, congestion, runny nose or sore throat.  SKIN: No rash or itching.  CARDIOVASCULAR:  RESPIRATORY: No shortness of breath, cough or sputum.  GASTROINTESTINAL: No anorexia, nausea, vomiting or diarrhea. No abdominal pain or blood.  GENITOURINARY: No burning on urination, no polyuria NEUROLOGICAL: No headache, dizziness, syncope, paralysis, ataxia, numbness or tingling in the extremities. No change in bowel or bladder  control.  MUSCULOSKELETAL: No muscle, back pain, joint pain or stiffness.  LYMPHATICS: No enlarged nodes. No history of splenectomy.  PSYCHIATRIC: No history of depression or anxiety.  ENDOCRINOLOGIC: No reports of sweating, cold or heat intolerance. No polyuria or polydipsia.  Marland Kitchen   Physical Examination There were no vitals filed for this visit. There were no vitals filed for this visit.  Gen: resting comfortably, no acute distress HEENT: no scleral icterus, pupils equal round and reactive, no palptable cervical adenopathy,  CV Resp: Clear to auscultation bilaterally GI: abdomen is soft, non-tender, non-distended, normal bowel sounds, no hepatosplenomegaly MSK: extremities are warm, no edema.  Skin: warm, no  rash Neuro:  no focal deficits Psych: appropriate affect   Diagnostic Studies     Assessment and Plan        Arnoldo Lenis, M.D., F.A.C.C.

## 2017-05-21 ENCOUNTER — Telehealth: Payer: Self-pay | Admitting: Cardiovascular Disease

## 2017-05-21 ENCOUNTER — Encounter: Payer: Self-pay | Admitting: Cardiovascular Disease

## 2017-05-21 ENCOUNTER — Ambulatory Visit: Payer: Medicare HMO | Admitting: Cardiovascular Disease

## 2017-05-21 VITALS — BP 118/78 | HR 87 | Ht 62.0 in | Wt 157.0 lb

## 2017-05-21 DIAGNOSIS — E119 Type 2 diabetes mellitus without complications: Secondary | ICD-10-CM

## 2017-05-21 DIAGNOSIS — I10 Essential (primary) hypertension: Secondary | ICD-10-CM | POA: Diagnosis not present

## 2017-05-21 DIAGNOSIS — R079 Chest pain, unspecified: Secondary | ICD-10-CM | POA: Diagnosis not present

## 2017-05-21 DIAGNOSIS — E785 Hyperlipidemia, unspecified: Secondary | ICD-10-CM

## 2017-05-21 NOTE — Telephone Encounter (Signed)
Pre-cert Verification for the following procedure   Exercise myoview scheduled for 05/29/2017 at [facility]

## 2017-05-21 NOTE — Patient Instructions (Signed)
Medication Instructions:  Your physician recommends that you continue on your current medications as directed. Please refer to the Current Medication list given to you today.  Labwork: none  Testing/Procedures: Your physician has requested that you have a lexiscan myoview. For further information please visit [email or web address]. Please follow instruction sheet, as given.  Follow-Up: Your physician recommends that you schedule a follow-up appointment in: 6-8 WEEKS WITH DR. Bronson Ing   Any Other Special Instructions Will Be Listed Below (If Applicable).  If you need a refill on your cardiac medications before your next appointment, please call your pharmacy.

## 2017-05-21 NOTE — Progress Notes (Signed)
CARDIOLOGY CONSULT NOTE  Patient ID: Erica Braun MRN: [id number] DOB/AGE: 68/14/1952 68 y.o.  Admit date: (Not on file) Primary Physician: Caren Macadam, MD Referring Physician: Dr. Meda Coffee  Reason for Consultation: Chest pain  HPI: Erica Braun is a 67 y.o. female who is being seen today for the evaluation of chest pain at the request of Raylene Everts, MD.   Past medical history includes type 2 diabetes mellitus, obstructive sleep apnea, hyperlipidemia, emphysema, hypertension, GERD, and a prior history of tobacco use.  I reviewed notes from her PCP.  She had been complaining of exertional chest pain on 04/06/2017.  It is associate with diaphoresis.  GI work-up was unremarkable.  I personally reviewed the ECG performed on 03/18/2017 which demonstrated normal sinus rhythm with no ischemic ST segment or T wave abnormalities, nor any arrhythmias.  She has noticed retrosternal chest discomfort with exertion such as activities around the house since about December or January.  She denies associated shortness of breath.  The pain is located in the retrosternal region and radiates beneath the left breast.  She has not been exercising.  She moved to their new house about 6 or 7 months ago.  She is originally from [place].  She denies orthopnea, leg swelling, palpitations, and paroxysmal nocturnal dyspnea.  Family history: Mother died of breast cancer at age 3.  She had scleroderma.  Her sister has breast cancer.  Father died of COPD and was a heavy smoker.    No Known Allergies  Current Outpatient Medications  Medication Sig Dispense Refill  . albuterol (PROVENTIL) (2.5 MG/3ML) 0.083% nebulizer solution Take 3 mLs (2.5 mg total) every 6 (six) hours as needed by nebulization for wheezing or shortness of breath. 75 mL 3  . aspirin EC 81 MG tablet Take 81 mg by mouth daily.    Marland Kitchen atorvastatin (LIPITOR) 20 MG tablet Take 1 tablet (20 mg total) by mouth daily. 90  tablet 3  . fluticasone furoate-vilanterol (BREO ELLIPTA) 100-25 MCG/INH AEPB Inhale 1 puff daily into the lungs. 3 each 3  . glimepiride (AMARYL) 4 MG tablet Take 1 tablet (4 mg total) daily with breakfast by mouth. 90 tablet 3  . losartan (COZAAR) 25 MG tablet Take 1 tablet (25 mg total) daily by mouth. 90 tablet 3  . metFORMIN (GLUCOPHAGE) 1000 MG tablet Take 1 tablet (1,000 mg total) 2 (two) times daily with a meal by mouth. 180 tablet 3  . omeprazole (PRILOSEC) 20 MG capsule Take 1 capsule (20 mg total) 2 (two) times daily before a meal by mouth. 180 capsule 3   No current facility-administered medications for this visit.     Past Medical History:  Diagnosis Date  . COPD (chronic obstructive pulmonary disease) ([place])   . Diabetes mellitus without complication ([place])   . Early cataracts, bilateral 11/21/2016  . Emphysema of lung ([place])   . GERD (gastroesophageal reflux disease)   . Glaucoma   . Hyperlipidemia   . Hypertension   . Sleep apnea     Past Surgical History:  Procedure Laterality Date  . ABDOMINAL HYSTERECTOMY     cervical dysplasia  . BREAST BIOPSY Left     Social History   Socioeconomic History  . Marital status: Married    Spouse name: Jenny Reichmann  . Number of children: 3  . Years of education: 16  . Highest education level: Not on file  Occupational History  . Occupation: retired    Comment: Advertising account planner  Social Needs  . Financial resource strain: Not hard at all  . Food insecurity:    Worry: Never true    Inability: Never true  . Transportation needs:    Medical: No    Non-medical: No  Tobacco Use  . Smoking status: Former Smoker    Packs/day: 1.50    Types: Cigarettes    Start date: 1969    Last attempt to quit: 2007    Years since quitting: 12.3  . Smokeless tobacco: Never Used  Substance and Sexual Activity  . Alcohol use: No    Frequency: Never  . Drug use: No  . Sexual activity: Yes    Birth control/protection: Post-menopausal  Lifestyle  .  Physical activity:    Days per week: 4 days    Minutes per session: 30 min  . Stress: Only a little  Relationships  . Social connections:    Talks on phone: Not on file    Gets together: Not on file    Attends religious service: Not on file    Active member of club or organization: Not on file    Attends meetings of clubs or organizations: Not on file    Relationship status: Not on file  . Intimate partner violence:    Fear of current or ex partner: No    Emotionally abused: No    Physically abused: No    Forced sexual activity: No  Other Topics Concern  . Not on file  Social History Narrative   Recent move from [place] to [place]   Married to [employer]  at home     No family history of premature CAD in 1st degree relatives.  Current Meds  Medication Sig  . albuterol (PROVENTIL) (2.5 MG/3ML) 0.083% nebulizer solution Take 3 mLs (2.5 mg total) every 6 (six) hours as needed by nebulization for wheezing or shortness of breath.  Marland Kitchen aspirin EC 81 MG tablet Take 81 mg by mouth daily.  Marland Kitchen atorvastatin (LIPITOR) 20 MG tablet Take 1 tablet (20 mg total) by mouth daily.  . fluticasone furoate-vilanterol (BREO ELLIPTA) 100-25 MCG/INH AEPB Inhale 1 puff daily into the lungs.  Marland Kitchen glimepiride (AMARYL) 4 MG tablet Take 1 tablet (4 mg total) daily with breakfast by mouth.  . losartan (COZAAR) 25 MG tablet Take 1 tablet (25 mg total) daily by mouth.  . metFORMIN (GLUCOPHAGE) 1000 MG tablet Take 1 tablet (1,000 mg total) 2 (two) times daily with a meal by mouth.  Marland Kitchen omeprazole (PRILOSEC) 20 MG capsule Take 1 capsule (20 mg total) 2 (two) times daily before a meal by mouth.      Review of systems complete and found to be negative unless listed above in HPI    Physical exam Blood pressure 118/78, pulse 87, height 5\' 2"  (1.575 m), weight 157 lb (71.2 kg), SpO2 98 %. General: NAD Neck: No JVD, no thyromegaly or thyroid nodule.  Lungs: Clear to auscultation bilaterally with normal  respiratory effort. CV: Nondisplaced PMI. Regular rate and rhythm, normal S1/S2, no S3/S4, no murmur.  No peripheral edema.  No carotid bruit.   Abdomen: Soft, nontender, no distention.  Skin: Intact without lesions or rashes.  Neurologic: Alert and oriented x 3.  Psych: Normal affect. Extremities: No clubbing or cyanosis.  HEENT: Normal.   ECG: Most recent ECG reviewed.   Labs: Lab Results  Component Value Date/Time   K 4.1 11/21/2016 10:12 AM   BUN 12 11/21/2016 10:12 AM  CREATININE 0.56 11/21/2016 10:12 AM   ALT 12 11/21/2016 10:12 AM   HGB 11.4 (L) 03/31/2017 08:58 AM     Lipids: Lab Results  Component Value Date/Time   LDLCALC 112 (H) 11/21/2016 10:12 AM   CHOL 195 11/21/2016 10:12 AM   TRIG 163 (H) 11/21/2016 10:12 AM   HDL 54 11/21/2016 10:12 AM        ASSESSMENT AND PLAN:  1.  Chest pain with exertion: Symptoms are suspicious for angina pectoris.  She has several cardiovascular risk factors as noted above.  She is already on aspirin and statin therapy. I will proceed with a nuclear myocardial perfusion imaging study to evaluate for ischemic heart disease (exercise Myoview).  2.  Hypertension: Controlled on losartan.  No changes.  3.  Type 2 diabetes mellitus: Currently on glimepiride and metformin.  4.  Hyperlipidemia: LDL 112 on 11/21/2016.  I would consider increasing Lipitor to 40 mg.    Disposition: Follow up in 6-8 weeks  Signed: Kate Sable, M.D., F.A.C.C.  05/21/2017, 1:22 PM

## 2017-05-29 ENCOUNTER — Encounter (HOSPITAL_COMMUNITY): Payer: Medicare HMO

## 2017-06-08 ENCOUNTER — Encounter (HOSPITAL_COMMUNITY): Payer: Self-pay

## 2017-06-08 ENCOUNTER — Telehealth: Payer: Self-pay

## 2017-06-08 ENCOUNTER — Encounter (HOSPITAL_COMMUNITY)
Admission: RE | Admit: 2017-06-08 | Discharge: 2017-06-08 | Disposition: A | Discharge status: Home / Self Care | Payer: Medicare HMO | Source: Ambulatory Visit | Attending: Cardiovascular Disease | Admitting: Cardiovascular Disease

## 2017-06-08 ENCOUNTER — Encounter (HOSPITAL_BASED_OUTPATIENT_CLINIC_OR_DEPARTMENT_OTHER)
Admission: RE | Admit: 2017-06-08 | Discharge: 2017-06-08 | Disposition: A | Discharge status: Home / Self Care | Payer: Medicare HMO | Source: Ambulatory Visit | Attending: Cardiovascular Disease | Admitting: Cardiovascular Disease

## 2017-06-08 DIAGNOSIS — R079 Chest pain, unspecified: Secondary | ICD-10-CM | POA: Insufficient documentation

## 2017-06-08 LAB — NM MYOCAR MULTI W/SPECT W/WALL MOTION / EF
CHL CUP MPHR: 153 {beats}/min
CHL CUP NUCLEAR SSS: 12
CHL RATE OF PERCEIVED EXERTION: 13
CSEPED: 3 min
Estimated workload: 6.2 METS
Exercise duration (sec): 42 s
LV sys vol: 29 mL
LVDIAVOL: 60 mL (ref 46–106)
NUC STRESS TID: 0.83
Peak HR: 151 {beats}/min
Percent HR: 98 %
RATE: 0.28
Rest HR: 78 {beats}/min
SDS: 4
SRS: 8

## 2017-06-08 MED ORDER — REGADENOSON 0.4 MG/5ML IV SOLN
INTRAVENOUS | Status: AC
  Filled 2017-06-08: qty 5

## 2017-06-08 MED ORDER — TECHNETIUM TC 99M TETROFOSMIN IV KIT
30.0000 | PACK | Freq: Once | INTRAVENOUS | Status: AC | PRN
  Administered 2017-06-08: 32 via INTRAVENOUS

## 2017-06-08 MED ORDER — SODIUM CHLORIDE 0.9% FLUSH
INTRAVENOUS | Status: AC
  Administered 2017-06-08: 10 mL via INTRAVENOUS
  Filled 2017-06-08: qty 10

## 2017-06-08 MED ORDER — METOPROLOL TARTRATE 25 MG PO TABS: 25.0000 mg | ORAL_TABLET | Freq: Two times a day (BID) | ORAL | 3 refills | Status: DC

## 2017-06-08 MED ORDER — TECHNETIUM TC 99M TETROFOSMIN IV KIT
10.0000 | PACK | Freq: Once | INTRAVENOUS | Status: AC | PRN
  Administered 2017-06-08: 11 via INTRAVENOUS

## 2017-06-08 NOTE — Telephone Encounter (Signed)
Pt notified, escribed lopressor, 30 day per pt, will take only after she sees md

## 2017-06-08 NOTE — Telephone Encounter (Signed)
-----   Message from Herminio Commons, MD sent at 06/08/2017 11:55 AM EDT ----- Suspicious for blockages.  Have her follow-up with me tomorrow at 2 PM to discuss next steps.  Start metoprolol tartrate 25 mg twice daily.

## 2017-06-09 ENCOUNTER — Other Ambulatory Visit (HOSPITAL_COMMUNITY)
Admission: RE | Admit: 2017-06-09 | Discharge: 2017-06-09 | Disposition: A | Discharge status: Home / Self Care | Payer: Medicare HMO | Source: Ambulatory Visit | Attending: Cardiovascular Disease | Admitting: Cardiovascular Disease

## 2017-06-09 ENCOUNTER — Other Ambulatory Visit: Payer: Self-pay | Admitting: Cardiovascular Disease

## 2017-06-09 ENCOUNTER — Encounter: Payer: Self-pay | Admitting: Cardiovascular Disease

## 2017-06-09 ENCOUNTER — Ambulatory Visit: Payer: Medicare HMO | Admitting: Cardiovascular Disease

## 2017-06-09 VITALS — BP 118/78 | HR 99 | Ht 62.0 in | Wt 155.0 lb

## 2017-06-09 DIAGNOSIS — I209 Angina pectoris, unspecified: Secondary | ICD-10-CM | POA: Diagnosis not present

## 2017-06-09 DIAGNOSIS — E119 Type 2 diabetes mellitus without complications: Secondary | ICD-10-CM | POA: Diagnosis not present

## 2017-06-09 DIAGNOSIS — E785 Hyperlipidemia, unspecified: Secondary | ICD-10-CM | POA: Diagnosis not present

## 2017-06-09 DIAGNOSIS — R9439 Abnormal result of other cardiovascular function study: Secondary | ICD-10-CM | POA: Insufficient documentation

## 2017-06-09 DIAGNOSIS — R079 Chest pain, unspecified: Secondary | ICD-10-CM

## 2017-06-09 DIAGNOSIS — Z01818 Encounter for other preprocedural examination: Secondary | ICD-10-CM

## 2017-06-09 LAB — BASIC METABOLIC PANEL
ANION GAP: 10 (ref 5–15)
BUN: 20 mg/dL (ref 6–20)
CHLORIDE: 99 mmol/L — AB (ref 101–111)
CO2: 27 mmol/L (ref 22–32)
CREATININE: 0.58 mg/dL (ref 0.44–1.00)
Calcium: 9.8 mg/dL (ref 8.9–10.3)
GFR calc non Af Amer: >60 mL/min (ref >60)
Glucose, Bld: 129 mg/dL — ABNORMAL HIGH (ref 65–99)
POTASSIUM: 4.1 mmol/L (ref 3.5–5.1)
SODIUM: 136 mmol/L (ref 135–145)

## 2017-06-09 LAB — CBC WITH DIFFERENTIAL/PLATELET
Basophils Absolute: 0 10*3/uL (ref 0.0–0.1)
Basophils Relative: 0 %
EOS ABS: 0.1 10*3/uL (ref 0.0–0.7)
Eosinophils Relative: 2 %
HCT: 36.5 % (ref 36.0–46.0)
HEMOGLOBIN: 11.5 g/dL — AB (ref 12.0–15.0)
LYMPHS ABS: 1.1 10*3/uL (ref 0.7–4.0)
Lymphocytes Relative: 23 %
MCH: 28 pg (ref 26.0–34.0)
MCHC: 31.5 g/dL (ref 30.0–36.0)
MCV: 89 fL (ref 78.0–100.0)
Monocytes Absolute: 0.4 10*3/uL (ref 0.1–1.0)
Monocytes Relative: 8 %
NEUTROS PCT: 67 %
Neutro Abs: 3.4 10*3/uL (ref 1.7–7.7)
Platelets: 143 10*3/uL — ABNORMAL LOW (ref 150–400)
RBC: 4.1 MIL/uL (ref 3.87–5.11)
RDW: 14.3 % (ref 11.5–15.5)
WBC: 5 10*3/uL (ref 4.0–10.5)

## 2017-06-09 MED ORDER — ATORVASTATIN CALCIUM 40 MG PO TABS: 40.0000 mg | ORAL_TABLET | Freq: Every day | ORAL | 3 refills | Status: DC

## 2017-06-09 NOTE — H&P (View-Only) (Signed)
SUBJECTIVE: The patient returns for follow-up after undergoing cardiovascular testing performed for the evaluation of chest pain. Nuclear stress test demonstrated an intermediate risk Duke treadmill score of 3.5.  There was a moderate sized, moderate intensity, apical to basal inferolateral defect, reversible at the apex, partially reversible in the mid level, and fixed at the base. It was suggestive of scar with moderate peri-infarct ischemia.  Upon review of the results yesterday, I prescribed metoprolol tartrate 25 mg twice daily.  She is already on aspirin and statin therapy.  She has had 1 or 2 more episodes of chest pain since her last visit with me.  It radiates from the retrosternal region underneath the left breast.  She has some right arm pain but thinks that is due to arthritis.  She has not picked up her metoprolol yet.  She is also being scheduled to see a sleep specialist.    Review of Systems: As per "subjective", otherwise negative.  No Known Allergies  Current Outpatient Medications  Medication Sig Dispense Refill  . albuterol (PROVENTIL) (2.5 MG/3ML) 0.083% nebulizer solution Take 3 mLs (2.5 mg total) every 6 (six) hours as needed by nebulization for wheezing or shortness of breath. 75 mL 3  . aspirin EC 81 MG tablet Take 81 mg by mouth daily.    Marland Kitchen atorvastatin (LIPITOR) 20 MG tablet Take 1 tablet (20 mg total) by mouth daily. 90 tablet 3  . fluticasone furoate-vilanterol (BREO ELLIPTA) 100-25 MCG/INH AEPB Inhale 1 puff daily into the lungs. 3 each 3  . glimepiride (AMARYL) 4 MG tablet Take 1 tablet (4 mg total) daily with breakfast by mouth. 90 tablet 3  . losartan (COZAAR) 25 MG tablet Take 1 tablet (25 mg total) daily by mouth. 90 tablet 3  . metFORMIN (GLUCOPHAGE) 1000 MG tablet Take 1 tablet (1,000 mg total) 2 (two) times daily with a meal by mouth. 180 tablet 3  . metoprolol tartrate (LOPRESSOR) 25 MG tablet Take 1 tablet (25 mg total) by mouth 2 (two) times  daily. 60 tablet 3  . omeprazole (PRILOSEC) 20 MG capsule Take 1 capsule (20 mg total) 2 (two) times daily before a meal by mouth. 180 capsule 3   No current facility-administered medications for this visit.     Past Medical History:  Diagnosis Date  . COPD (chronic obstructive pulmonary disease) ([place])   . Diabetes mellitus without complication ([place])   . Early cataracts, bilateral 11/21/2016  . Emphysema of lung ([place])   . GERD (gastroesophageal reflux disease)   . Glaucoma   . Hyperlipidemia   . Hypertension   . Sleep apnea     Past Surgical History:  Procedure Laterality Date  . ABDOMINAL HYSTERECTOMY     cervical dysplasia  . BREAST BIOPSY Left     Social History   Socioeconomic History  . Marital status: Married    Spouse name: Jenny Reichmann  . Number of children: 3  . Years of education: 66  . Highest education level: Not on file  Occupational History  . Occupation: retired    Comment: Advertising account planner  Social Needs  . Financial resource strain: Not hard at all  . Food insecurity:    Worry: Never true    Inability: Never true  . Transportation needs:    Medical: No    Non-medical: No  Tobacco Use  . Smoking status: Former Smoker    Packs/day: 1.50    Types: Cigarettes    Start date: 1969  Last attempt to quit: 2007    Years since quitting: 12.4  . Smokeless tobacco: Never Used  Substance and Sexual Activity  . Alcohol use: No    Frequency: Never  . Drug use: No  . Sexual activity: Yes    Birth control/protection: Post-menopausal  Lifestyle  . Physical activity:    Days per week: 4 days    Minutes per session: 30 min  . Stress: Only a little  Relationships  . Social connections:    Talks on phone: Not on file    Gets together: Not on file    Attends religious service: Not on file    Active member of club or organization: Not on file    Attends meetings of clubs or organizations: Not on file    Relationship status: Not on file  . Intimate partner  violence:    Fear of current or ex partner: No    Emotionally abused: No    Physically abused: No    Forced sexual activity: No  Other Topics Concern  . Not on file  Social History Narrative   Recent move from [place] to walk   Married to [employer] dog at home     Vitals:   06/09/17 1411  BP: 118/78  Pulse: 99  SpO2: 96%  Weight: 155 lb (70.3 kg)  Height: 5\' 2"  (1.575 m)    Wt Readings from Last 3 Encounters:  06/09/17 155 lb (70.3 kg)  05/21/17 157 lb (71.2 kg)  04/06/17 158 lb 1.9 oz (71.7 kg)     PHYSICAL EXAM General: NAD HEENT: Normal. Neck: No JVD, no thyromegaly. Lungs: Clear to auscultation bilaterally with normal respiratory effort. CV: Regular rate and rhythm, normal S1/S2, no S3/S4, no murmur. No pretibial or periankle edema.     Abdomen: Soft, nontender, no distention.  Neurologic: Alert and oriented.  Psych: Normal affect. Skin: Normal. Musculoskeletal: No gross deformities.    ECG: Most recent ECG reviewed.   Labs: Lab Results  Component Value Date/Time   K 4.1 11/21/2016 10:12 AM   BUN 12 11/21/2016 10:12 AM   CREATININE 0.56 11/21/2016 10:12 AM   ALT 12 11/21/2016 10:12 AM   HGB 11.4 (L) 03/31/2017 08:58 AM     Lipids: Lab Results  Component Value Date/Time   LDLCALC 112 (H) 11/21/2016 10:12 AM   CHOL 195 11/21/2016 10:12 AM   TRIG 163 (H) 11/21/2016 10:12 AM   HDL 54 11/21/2016 10:12 AM       ASSESSMENT AND PLAN:  1.  Chest pain with exertion (angina pectoris) and abnormal nuclear stress test: Symptoms and nuclear stress test findings detailed above are suggestive of ischemic heart disease.  She has several cardiovascular risk factors as noted above.  She is already on aspirin and statin therapy and I recently added metoprolol tartrate 25 mg twice daily.  I will increase Lipitor to 40 mg. I will arrange for coronary angiography. Risks and benefits of cardiac catheterization have been discussed with the patient.  These  include bleeding, infection, kidney damage, stroke, heart attack, death.  The patient understands these risks and is willing to proceed.  2.  Hypertension: Controlled on losartan.    I will monitor given addition of metoprolol tartrate.  3.  Type 2 diabetes mellitus: Currently on glimepiride and metformin.  She is also on Lipitor.  I will increase to 40 mg.  4.  Hyperlipidemia: LDL 112 on 11/21/2016.  I will increase Lipitor  to 40 mg.     Disposition: Follow up 6 weeks  Time spent: 40 minutes, of which greater than 50% was spent reviewing symptoms, relevant blood tests and studies, and discussing management plan with the patient.    Kate Sable, M.D., F.A.C.C.

## 2017-06-09 NOTE — Progress Notes (Signed)
SUBJECTIVE: The patient returns for follow-up after undergoing cardiovascular testing performed for the evaluation of chest pain. Nuclear stress test demonstrated an intermediate risk Duke treadmill score of 3.5.  There was a moderate sized, moderate intensity, apical to basal inferolateral defect, reversible at the apex, partially reversible in the mid level, and fixed at the base. It was suggestive of scar with moderate peri-infarct ischemia.  Upon review of the results yesterday, I prescribed metoprolol tartrate 25 mg twice daily.  She is already on aspirin and statin therapy.  She has had 1 or 2 more episodes of chest pain since her last visit with me.  It radiates from the retrosternal region underneath the left breast.  She has some right arm pain but thinks that is due to arthritis.  She has not picked up her metoprolol yet.  She is also being scheduled to see a sleep specialist.    Review of Systems: As per "subjective", otherwise negative.  No Known Allergies  Current Outpatient Medications  Medication Sig Dispense Refill  . albuterol (PROVENTIL) (2.5 MG/3ML) 0.083% nebulizer solution Take 3 mLs (2.5 mg total) every 6 (six) hours as needed by nebulization for wheezing or shortness of breath. 75 mL 3  . aspirin EC 81 MG tablet Take 81 mg by mouth daily.    Marland Kitchen atorvastatin (LIPITOR) 20 MG tablet Take 1 tablet (20 mg total) by mouth daily. 90 tablet 3  . fluticasone furoate-vilanterol (BREO ELLIPTA) 100-25 MCG/INH AEPB Inhale 1 puff daily into the lungs. 3 each 3  . glimepiride (AMARYL) 4 MG tablet Take 1 tablet (4 mg total) daily with breakfast by mouth. 90 tablet 3  . losartan (COZAAR) 25 MG tablet Take 1 tablet (25 mg total) daily by mouth. 90 tablet 3  . metFORMIN (GLUCOPHAGE) 1000 MG tablet Take 1 tablet (1,000 mg total) 2 (two) times daily with a meal by mouth. 180 tablet 3  . metoprolol tartrate (LOPRESSOR) 25 MG tablet Take 1 tablet (25 mg total) by mouth 2 (two) times  daily. 60 tablet 3  . omeprazole (PRILOSEC) 20 MG capsule Take 1 capsule (20 mg total) 2 (two) times daily before a meal by mouth. 180 capsule 3   No current facility-administered medications for this visit.     Past Medical History:  Diagnosis Date  . COPD (chronic obstructive pulmonary disease) ([place])   . Diabetes mellitus without complication ([place])   . Early cataracts, bilateral 11/21/2016  . Emphysema of lung ([place])   . GERD (gastroesophageal reflux disease)   . Glaucoma   . Hyperlipidemia   . Hypertension   . Sleep apnea     Past Surgical History:  Procedure Laterality Date  . ABDOMINAL HYSTERECTOMY     cervical dysplasia  . BREAST BIOPSY Left     Social History   Socioeconomic History  . Marital status: Married    Spouse name: Jenny Reichmann  . Number of children: 3  . Years of education: 98  . Highest education level: Not on file  Occupational History  . Occupation: retired    Comment: Advertising account planner  Social Needs  . Financial resource strain: Not hard at all  . Food insecurity:    Worry: Never true    Inability: Never true  . Transportation needs:    Medical: No    Non-medical: No  Tobacco Use  . Smoking status: Former Smoker    Packs/day: 1.50    Types: Cigarettes    Start date: 1969  Last attempt to quit: 2007    Years since quitting: 12.4  . Smokeless tobacco: Never Used  Substance and Sexual Activity  . Alcohol use: No    Frequency: Never  . Drug use: No  . Sexual activity: Yes    Birth control/protection: Post-menopausal  Lifestyle  . Physical activity:    Days per week: 4 days    Minutes per session: 30 min  . Stress: Only a little  Relationships  . Social connections:    Talks on phone: Not on file    Gets together: Not on file    Attends religious service: Not on file    Active member of club or organization: Not on file    Attends meetings of clubs or organizations: Not on file    Relationship status: Not on file  . Intimate partner  violence:    Fear of current or ex partner: No    Emotionally abused: No    Physically abused: No    Forced sexual activity: No  Other Topics Concern  . Not on file  Social History Narrative   Recent move from [place] to walk   Married to [employer] dog at home     Vitals:   06/09/17 1411  BP: 118/78  Pulse: 99  SpO2: 96%  Weight: 155 lb (70.3 kg)  Height: 5\' 2"  (1.575 m)    Wt Readings from Last 3 Encounters:  06/09/17 155 lb (70.3 kg)  05/21/17 157 lb (71.2 kg)  04/06/17 158 lb 1.9 oz (71.7 kg)     PHYSICAL EXAM General: NAD HEENT: Normal. Neck: No JVD, no thyromegaly. Lungs: Clear to auscultation bilaterally with normal respiratory effort. CV: Regular rate and rhythm, normal S1/S2, no S3/S4, no murmur. No pretibial or periankle edema.     Abdomen: Soft, nontender, no distention.  Neurologic: Alert and oriented.  Psych: Normal affect. Skin: Normal. Musculoskeletal: No gross deformities.    ECG: Most recent ECG reviewed.   Labs: Lab Results  Component Value Date/Time   K 4.1 11/21/2016 10:12 AM   BUN 12 11/21/2016 10:12 AM   CREATININE 0.56 11/21/2016 10:12 AM   ALT 12 11/21/2016 10:12 AM   HGB 11.4 (L) 03/31/2017 08:58 AM     Lipids: Lab Results  Component Value Date/Time   LDLCALC 112 (H) 11/21/2016 10:12 AM   CHOL 195 11/21/2016 10:12 AM   TRIG 163 (H) 11/21/2016 10:12 AM   HDL 54 11/21/2016 10:12 AM       ASSESSMENT AND PLAN:  1.  Chest pain with exertion (angina pectoris) and abnormal nuclear stress test: Symptoms and nuclear stress test findings detailed above are suggestive of ischemic heart disease.  She has several cardiovascular risk factors as noted above.  She is already on aspirin and statin therapy and I recently added metoprolol tartrate 25 mg twice daily.  I will increase Lipitor to 40 mg. I will arrange for coronary angiography. Risks and benefits of cardiac catheterization have been discussed with the patient.  These  include bleeding, infection, kidney damage, stroke, heart attack, death.  The patient understands these risks and is willing to proceed.  2.  Hypertension: Controlled on losartan.    I will monitor given addition of metoprolol tartrate.  3.  Type 2 diabetes mellitus: Currently on glimepiride and metformin.  She is also on Lipitor.  I will increase to 40 mg.  4.  Hyperlipidemia: LDL 112 on 11/21/2016.  I will increase Lipitor  to 40 mg.     Disposition: Follow up 6 weeks  Time spent: 40 minutes, of which greater than 50% was spent reviewing symptoms, relevant blood tests and studies, and discussing management plan with the patient.    Kate Sable, M.D., F.A.C.C.

## 2017-06-09 NOTE — Patient Instructions (Addendum)
Your physician recommends that you schedule a follow-up appointment in:  In 6 weeks with Dr.Koneswaran    INCREASE Lipitor to 40 mg daily at dinner      [place] [address] Dept: [phone] Loc: [place]  06/09/2017  You are scheduled for a Cardiac Catheterization on Thursday, June 6 with Dr. Daneen Schick.  1. Please arrive at the [facility] (Main Entrance A) at [facility]: [place], [place] [address] at 10:00 AM (two hours before your procedure to ensure your preparation). Free valet parking service is available.   Special note: Every effort is made to have your procedure done on time. Please understand that emergencies sometimes delay scheduled procedures.  2. Diet: Do not eat or drink anything after midnight prior to your procedure except sips of water to take medications.  3. Labs:get lab work now  4. Medication instructions in preparation for your procedure:   STOP taking Metformin the Day of test (Thursday 06/11/17) and for 2 days after  HOLD Glimepiride the Day Of test  (Thursday)  On the morning of your procedure, take your Aspirin and any morning medicines NOT listed above.  You may use sips of water.  5. Plan for one night stay--bring personal belongings. 6. Bring a current list of your medications and current insurance cards. 7. You MUST have a responsible person to drive you home. 8. Someone MUST be with you the first 24 hours after you arrive home or your discharge will be delayed. 9. Please wear clothes that are easy to get on and off and wear slip-on shoes.  Thank you for allowing [place] to care for you!   -- [facility]

## 2017-06-10 ENCOUNTER — Telehealth: Payer: Self-pay | Admitting: *Deleted

## 2017-06-10 NOTE — Telephone Encounter (Signed)
Pt contacted pre-catheterization scheduled at [facility] for: Thursday June 11, 2017 12 noon Verified arrival time and place: [place] Entrance A at: 10 AM  No solid food after midnight prior to cath, clear liquids until 5 AM day of procedure.  Hold: Metformin 06/10/17 and 48 hours post procedure Glimepiride AM of procedure  AM meds can be  taken pre-cath with sip of water including: ASA 81 mg   Confirm patient has responsible person to drive home post procedure and observe patient for 24 hours  Left detailed message at [phone] [facility]) with instructions.

## 2017-06-11 ENCOUNTER — Encounter (HOSPITAL_COMMUNITY)
Admission: RE | Disposition: A | Discharge status: Home / Self Care | Payer: Self-pay | Source: Ambulatory Visit | Attending: Interventional Cardiology

## 2017-06-11 ENCOUNTER — Ambulatory Visit (HOSPITAL_COMMUNITY)
Admission: RE | Admit: 2017-06-11 | Discharge: 2017-06-11 | Disposition: A | Discharge status: Home / Self Care | Payer: Medicare HMO | Source: Ambulatory Visit | Attending: Interventional Cardiology | Admitting: Interventional Cardiology

## 2017-06-11 ENCOUNTER — Other Ambulatory Visit: Payer: Self-pay

## 2017-06-11 ENCOUNTER — Encounter: Payer: Self-pay | Admitting: Family Medicine

## 2017-06-11 DIAGNOSIS — Z87891 Personal history of nicotine dependence: Secondary | ICD-10-CM | POA: Insufficient documentation

## 2017-06-11 DIAGNOSIS — I25758 Atherosclerosis of native coronary artery of transplanted heart with other forms of angina pectoris: Secondary | ICD-10-CM | POA: Diagnosis not present

## 2017-06-11 DIAGNOSIS — H269 Unspecified cataract: Secondary | ICD-10-CM | POA: Insufficient documentation

## 2017-06-11 DIAGNOSIS — E119 Type 2 diabetes mellitus without complications: Secondary | ICD-10-CM | POA: Diagnosis not present

## 2017-06-11 DIAGNOSIS — E785 Hyperlipidemia, unspecified: Secondary | ICD-10-CM | POA: Insufficient documentation

## 2017-06-11 DIAGNOSIS — J449 Chronic obstructive pulmonary disease, unspecified: Secondary | ICD-10-CM | POA: Insufficient documentation

## 2017-06-11 DIAGNOSIS — H409 Unspecified glaucoma: Secondary | ICD-10-CM | POA: Diagnosis not present

## 2017-06-11 DIAGNOSIS — E1159 Type 2 diabetes mellitus with other circulatory complications: Secondary | ICD-10-CM | POA: Diagnosis present

## 2017-06-11 DIAGNOSIS — Z9889 Other specified postprocedural states: Secondary | ICD-10-CM | POA: Diagnosis not present

## 2017-06-11 DIAGNOSIS — Z9989 Dependence on other enabling machines and devices: Secondary | ICD-10-CM

## 2017-06-11 DIAGNOSIS — I209 Angina pectoris, unspecified: Secondary | ICD-10-CM | POA: Diagnosis present

## 2017-06-11 DIAGNOSIS — Z7982 Long term (current) use of aspirin: Secondary | ICD-10-CM | POA: Insufficient documentation

## 2017-06-11 DIAGNOSIS — I2510 Atherosclerotic heart disease of native coronary artery without angina pectoris: Secondary | ICD-10-CM

## 2017-06-11 DIAGNOSIS — Z79899 Other long term (current) drug therapy: Secondary | ICD-10-CM | POA: Diagnosis not present

## 2017-06-11 DIAGNOSIS — Z90710 Acquired absence of both cervix and uterus: Secondary | ICD-10-CM | POA: Diagnosis not present

## 2017-06-11 DIAGNOSIS — G4730 Sleep apnea, unspecified: Secondary | ICD-10-CM | POA: Diagnosis not present

## 2017-06-11 DIAGNOSIS — I10 Essential (primary) hypertension: Secondary | ICD-10-CM | POA: Diagnosis not present

## 2017-06-11 DIAGNOSIS — I152 Hypertension secondary to endocrine disorders: Secondary | ICD-10-CM | POA: Diagnosis present

## 2017-06-11 DIAGNOSIS — I25119 Atherosclerotic heart disease of native coronary artery with unspecified angina pectoris: Secondary | ICD-10-CM | POA: Diagnosis not present

## 2017-06-11 DIAGNOSIS — I2582 Chronic total occlusion of coronary artery: Secondary | ICD-10-CM | POA: Insufficient documentation

## 2017-06-11 DIAGNOSIS — G4733 Obstructive sleep apnea (adult) (pediatric): Secondary | ICD-10-CM

## 2017-06-11 DIAGNOSIS — Z7984 Long term (current) use of oral hypoglycemic drugs: Secondary | ICD-10-CM | POA: Diagnosis not present

## 2017-06-11 DIAGNOSIS — R931 Abnormal findings on diagnostic imaging of heart and coronary circulation: Secondary | ICD-10-CM

## 2017-06-11 DIAGNOSIS — E782 Mixed hyperlipidemia: Secondary | ICD-10-CM | POA: Diagnosis present

## 2017-06-11 DIAGNOSIS — K219 Gastro-esophageal reflux disease without esophagitis: Secondary | ICD-10-CM | POA: Insufficient documentation

## 2017-06-11 DIAGNOSIS — E1169 Type 2 diabetes mellitus with other specified complication: Secondary | ICD-10-CM | POA: Diagnosis present

## 2017-06-11 HISTORY — PX: LEFT HEART CATH AND CORONARY ANGIOGRAPHY: CATH118249

## 2017-06-11 LAB — GLUCOSE, CAPILLARY: GLUCOSE-CAPILLARY: 161 mg/dL — AB (ref 65–99)

## 2017-06-11 SURGERY — LEFT HEART CATH AND CORONARY ANGIOGRAPHY
Anesthesia: LOCAL

## 2017-06-11 MED ORDER — IOPAMIDOL (ISOVUE-370) INJECTION 76%
INTRAVENOUS | Status: DC | PRN
  Administered 2017-06-11: 70 mL via INTRAVENOUS

## 2017-06-11 MED ORDER — SODIUM CHLORIDE 0.9% FLUSH: 3.0000 mL | INTRAVENOUS | Status: DC | PRN

## 2017-06-11 MED ORDER — ACETAMINOPHEN 325 MG PO TABS: 650.0000 mg | ORAL_TABLET | ORAL | Status: DC | PRN

## 2017-06-11 MED ORDER — ASPIRIN 81 MG PO CHEW: 81.0000 mg | CHEWABLE_TABLET | Freq: Every day | ORAL | Status: DC

## 2017-06-11 MED ORDER — ISOSORBIDE MONONITRATE ER 60 MG PO TB24: 60.0000 mg | ORAL_TABLET | Freq: Every day | ORAL | Status: DC

## 2017-06-11 MED ORDER — NITROGLYCERIN 0.4 MG SL SUBL
0.4000 mg | SUBLINGUAL_TABLET | SUBLINGUAL | 3 refills | Status: DC | PRN
Start: 2017-06-11 — End: 2019-01-14

## 2017-06-11 MED ORDER — ISOSORBIDE MONONITRATE ER 30 MG PO TB24: 30.0000 mg | ORAL_TABLET | Freq: Every day | ORAL | 11 refills | Status: DC

## 2017-06-11 MED ORDER — SODIUM CHLORIDE 0.9 % IV SOLN: INTRAVENOUS | Status: DC

## 2017-06-11 MED ORDER — SODIUM CHLORIDE 0.9 % WEIGHT BASED INFUSION: 1.0000 mL/kg/h | INTRAVENOUS | Status: DC

## 2017-06-11 MED ORDER — LIDOCAINE HCL (PF) 1 % IJ SOLN
INTRAMUSCULAR | Status: AC
  Filled 2017-06-11: qty 30

## 2017-06-11 MED ORDER — HEPARIN (PORCINE) IN NACL 1000-0.9 UT/500ML-% IV SOLN
INTRAVENOUS | Status: AC
  Filled 2017-06-11: qty 1000

## 2017-06-11 MED ORDER — HEPARIN (PORCINE) IN NACL 2-0.9 UNITS/ML
INTRAMUSCULAR | Status: DC | PRN
  Administered 2017-06-11: 10 mL via INTRA_ARTERIAL

## 2017-06-11 MED ORDER — SODIUM CHLORIDE 0.9% FLUSH: 3.0000 mL | Freq: Two times a day (BID) | INTRAVENOUS | Status: DC

## 2017-06-11 MED ORDER — SODIUM CHLORIDE 0.9 % IV SOLN: 250.0000 mL | INTRAVENOUS | Status: DC | PRN

## 2017-06-11 MED ORDER — HEPARIN (PORCINE) IN NACL 2-0.9 UNITS/ML
INTRAMUSCULAR | Status: AC | PRN
  Administered 2017-06-11 (×2): 500 mL via INTRA_ARTERIAL

## 2017-06-11 MED ORDER — LIDOCAINE HCL (PF) 1 % IJ SOLN
INTRAMUSCULAR | Status: DC | PRN
  Administered 2017-06-11: 2 mL

## 2017-06-11 MED ORDER — ONDANSETRON HCL 4 MG/2ML IJ SOLN: 4.0000 mg | Freq: Four times a day (QID) | INTRAMUSCULAR | Status: DC | PRN

## 2017-06-11 MED ORDER — HEPARIN SODIUM (PORCINE) 1000 UNIT/ML IJ SOLN
INTRAMUSCULAR | Status: AC
  Filled 2017-06-11: qty 1

## 2017-06-11 MED ORDER — ASPIRIN 81 MG PO CHEW: 81.0000 mg | CHEWABLE_TABLET | ORAL | Status: DC

## 2017-06-11 MED ORDER — SODIUM CHLORIDE 0.9 % WEIGHT BASED INFUSION
3.0000 mL/kg/h | INTRAVENOUS | Status: DC
  Administered 2017-06-11: 3 mL/kg/h via INTRAVENOUS

## 2017-06-11 MED ORDER — HEPARIN SODIUM (PORCINE) 1000 UNIT/ML IJ SOLN
INTRAMUSCULAR | Status: DC | PRN
  Administered 2017-06-11: 4000 [IU] via INTRAVENOUS

## 2017-06-11 MED ORDER — OXYCODONE HCL 5 MG PO TABS: 5.0000 mg | ORAL_TABLET | ORAL | Status: DC | PRN

## 2017-06-11 MED ORDER — MIDAZOLAM HCL 2 MG/2ML IJ SOLN
INTRAMUSCULAR | Status: DC | PRN
  Administered 2017-06-11: 1 mg via INTRAVENOUS

## 2017-06-11 MED ORDER — FENTANYL CITRATE (PF) 100 MCG/2ML IJ SOLN
INTRAMUSCULAR | Status: DC | PRN
  Administered 2017-06-11: 50 ug via INTRAVENOUS

## 2017-06-11 MED ORDER — VERAPAMIL HCL 2.5 MG/ML IV SOLN
INTRAVENOUS | Status: AC
  Filled 2017-06-11: qty 2

## 2017-06-11 MED ORDER — IOPAMIDOL (ISOVUE-370) INJECTION 76%
INTRAVENOUS | Status: AC
  Filled 2017-06-11: qty 100

## 2017-06-11 MED ORDER — FENTANYL CITRATE (PF) 100 MCG/2ML IJ SOLN
INTRAMUSCULAR | Status: AC
  Filled 2017-06-11: qty 2

## 2017-06-11 MED ORDER — MIDAZOLAM HCL 2 MG/2ML IJ SOLN
INTRAMUSCULAR | Status: AC
  Filled 2017-06-11: qty 2

## 2017-06-11 SURGICAL SUPPLY — 10 items
CATH INFINITI 5 FR JL3.5 (CATHETERS) ×2 IMPLANT
CATH INFINITI JR4 5F (CATHETERS) ×2 IMPLANT
DEVICE RAD COMP TR BAND LRG (VASCULAR PRODUCTS) ×2 IMPLANT
GLIDESHEATH SLEND A-KIT 6F 22G (SHEATH) ×2 IMPLANT
GUIDEWIRE INQWIRE 1.5J.035X260 (WIRE) ×1 IMPLANT
INQWIRE 1.5J .035X260CM (WIRE) ×2
KIT HEART LEFT (KITS) ×2 IMPLANT
PACK CARDIAC CATHETERIZATION (CUSTOM PROCEDURE TRAY) ×2 IMPLANT
TRANSDUCER W/STOPCOCK (MISCELLANEOUS) ×2 IMPLANT
TUBING CIL FLEX 10 FLL-RA (TUBING) ×2 IMPLANT

## 2017-06-11 NOTE — Interval H&P Note (Signed)
Cath Lab Visit (complete for each Cath Lab visit)  Clinical Evaluation Leading to the Procedure:   ACS: No.  Non-ACS:    Anginal Classification: CCS III  Anti-ischemic medical therapy: Minimal Therapy (1 class of medications)  Non-Invasive Test Results: Intermediate-risk stress test findings: cardiac mortality 1-3%/year  Prior CABG: No previous CABG      History and Physical Interval Note:  06/11/2017 12:25 PM  Erica Braun  has presented today for surgery, with the diagnosis of abnormal nuc  The various methods of treatment have been discussed with the patient and family. After consideration of risks, benefits and other options for treatment, the patient has consented to  Procedure(s): LEFT HEART CATH AND CORONARY ANGIOGRAPHY (N/A) as a surgical intervention .  The patient's history has been reviewed, patient examined, no change in status, stable for surgery.  I have reviewed the patient's chart and labs.  Questions were answered to the patient's satisfaction.     [name]

## 2017-06-11 NOTE — Research (Signed)
CADFEM Informed Consent   Subject Name: Erica Braun  Subject met inclusion and exclusion criteria.  The informed consent form, study requirements and expectations were reviewed with the subject and questions and concerns were addressed prior to the signing of the consent form.  The subject verbalized understanding of the trail requirements.  The subject agreed to participate in the CADFEM trial and signed the informed consent.  The informed consent was obtained prior to performance of any protocol-specific procedures for the subject.  A copy of the signed informed consent was given to the subject and a copy was placed in the subject's medical record.  [name] 06/11/2017, 11:00 AM

## 2017-06-11 NOTE — Discharge Instructions (Signed)
**Note Fianna Snowball-identified via Obfuscation** Radial Site Care °Refer to this sheet in the next few weeks. These instructions provide you with information about caring for yourself after your procedure. Your health care provider may also give you more specific instructions. Your treatment has been planned according to current medical practices, but problems sometimes occur. Call your health care provider if you have any problems or questions after your procedure. °What can I expect after the procedure? °After your procedure, it is typical to have the following: °· Bruising at the radial site that usually fades within 1-2 weeks. °· Blood collecting in the tissue (hematoma) that may be painful to the touch. It should usually decrease in size and tenderness within 1-2 weeks. ° °Follow these instructions at home: °· Take medicines only as directed by your health care provider. °· You may shower 24-48 hours after the procedure or as directed by your health care provider. Remove the bandage (dressing) and gently wash the site with plain soap and water. Pat the area dry with a clean towel. Do not rub the site, because this may cause bleeding. °· Do not take baths, swim, or use a hot tub until your health care provider approves. °· Check your insertion site every day for redness, swelling, or drainage. °· Do not apply powder or lotion to the site. °· Do not flex or bend the affected arm for 24 hours or as directed by your health care provider. °· Do not push or pull heavy objects with the affected arm for 24 hours or as directed by your health care provider. °· Do not lift over 10 lb (4.5 kg) for 5 days after your procedure or as directed by your health care provider. °· Ask your health care provider when it is okay to: °? Return to work or school. °? Resume usual physical activities or sports. °? Resume sexual activity. °· Do not drive home if you are discharged the same day as the procedure. Have someone else drive you. °· You may drive 24 hours after the procedure  unless otherwise instructed by your health care provider. °· Do not operate machinery or power tools for 24 hours after the procedure. °· If your procedure was done as an outpatient procedure, which means that you went home the same day as your procedure, a responsible adult should be with you for the first 24 hours after you arrive home. °· Keep all follow-up visits as directed by your health care provider. This is important. °Contact a health care provider if: °· You have a fever. °· You have chills. °· You have increased bleeding from the radial site. Hold pressure on the site. °Get help right away if: °· You have unusual pain at the radial site. °· You have redness, warmth, or swelling at the radial site. °· You have drainage (other than a small amount of blood on the dressing) from the radial site. °· The radial site is bleeding, and the bleeding does not stop after 30 minutes of holding steady pressure on the site. °· Your arm or hand becomes pale, cool, tingly, or numb. °This information is not intended to replace advice given to you by your health care provider. Make sure you discuss any questions you have with your health care provider. °Document Released: 01/25/2010 Document Revised: 05/31/2015 Document Reviewed: 07/11/2013 °Elsevier Interactive Patient Education © 2018 [employer] ° °

## 2017-06-12 ENCOUNTER — Encounter (HOSPITAL_COMMUNITY): Payer: Self-pay | Admitting: Interventional Cardiology

## 2017-06-12 ENCOUNTER — Encounter: Payer: Self-pay | Admitting: Family Medicine

## 2017-06-15 MED FILL — Heparin Sod (Porcine)-NaCl IV Soln 1000 Unit/500ML-0.9%: INTRAVENOUS | Qty: 1000 | Status: AC

## 2017-07-07 ENCOUNTER — Encounter (INDEPENDENT_AMBULATORY_CARE_PROVIDER_SITE_OTHER): Payer: Self-pay | Admitting: Internal Medicine

## 2017-07-07 ENCOUNTER — Ambulatory Visit (INDEPENDENT_AMBULATORY_CARE_PROVIDER_SITE_OTHER): Payer: Medicare HMO | Admitting: Internal Medicine

## 2017-07-07 VITALS — BP 148/80 | HR 64 | Temp 97.7°F | Ht 62.0 in | Wt 158.1 lb

## 2017-07-07 DIAGNOSIS — R1310 Dysphagia, unspecified: Secondary | ICD-10-CM | POA: Diagnosis not present

## 2017-07-07 DIAGNOSIS — K219 Gastro-esophageal reflux disease without esophagitis: Secondary | ICD-10-CM

## 2017-07-07 DIAGNOSIS — R1319 Other dysphagia: Secondary | ICD-10-CM

## 2017-07-07 NOTE — Progress Notes (Signed)
Subjective:    Patient ID: Erica Braun, female    DOB: 04-13-1950, 67 y.o.   MRN: [id number]  HPI Here today for f/u. Last seen in March of this for dysphagia. Underwent a DG Esophagram which revealed no significant abnormalities. Prominent cricopharyngeus muscle noted. She tells me she is doing okay. Sometimes she hurt in her mid-esophagus. If she eats anything acidic, she will have pain. Carbonated drinks and tomatoes burns her esophagus. Her appetite is okay. No weight loss.  She has a BM daily. No melena or BRRB.  Her last colonoscopy was in 2017 in [place] (screening) which revealed internal hemorrhoids, diverticulosis in sigmoid colon, rest of colon was normal. (scannned in epic).   Review of Systems Past Medical History:  Diagnosis Date  . COPD (chronic obstructive pulmonary disease) ([place])   . Diabetes mellitus without complication ([place])   . Early cataracts, bilateral 11/21/2016  . Emphysema of lung ([place])   . GERD (gastroesophageal reflux disease)   . Glaucoma   . Hyperlipidemia   . Hypertension   . Sleep apnea     Past Surgical History:  Procedure Laterality Date  . ABDOMINAL HYSTERECTOMY     cervical dysplasia  . BREAST BIOPSY Left   . LEFT HEART CATH AND CORONARY ANGIOGRAPHY N/A 06/11/2017   Procedure: LEFT HEART CATH AND CORONARY ANGIOGRAPHY;  Surgeon: Belva Crome, MD;  Location: [facility];  Service: Cardiovascular;  Laterality: N/A;    No Known Allergies  Current Outpatient Medications on File Prior to Visit  Medication Sig Dispense Refill  . albuterol (PROVENTIL) (2.5 MG/3ML) 0.083% nebulizer solution Take 3 mLs (2.5 mg total) every 6 (six) hours as needed by nebulization for wheezing or shortness of breath. 75 mL 3  . aspirin EC 81 MG tablet Take 81 mg by mouth daily.    Marland Kitchen atorvastatin (LIPITOR) 40 MG tablet Take 1 tablet (40 mg total) by mouth daily. (Patient taking differently: Take 40 mg by mouth every evening. ) 90 tablet 3  . CALCIUM PO  Take 1 tablet by mouth daily.    . Cholecalciferol (VITAMIN D3) 2000 units TABS Take 4,000 Units by mouth daily.    . fluticasone furoate-vilanterol (BREO ELLIPTA) 100-25 MCG/INH AEPB Inhale 1 puff daily into the lungs. 3 each 3  . glimepiride (AMARYL) 4 MG tablet Take 1 tablet (4 mg total) daily with breakfast by mouth. (Patient taking differently: Take 2 mg by mouth 2 (two) times daily. ) 90 tablet 3  . isosorbide mononitrate (IMDUR) 30 MG 24 hr tablet Take 1 tablet (30 mg total) by mouth daily. 30 tablet 11  . losartan (COZAAR) 25 MG tablet Take 1 tablet (25 mg total) daily by mouth. 90 tablet 3  . metFORMIN (GLUCOPHAGE) 1000 MG tablet Take 1 tablet (1,000 mg total) 2 (two) times daily with a meal by mouth. 180 tablet 3  . metoprolol tartrate (LOPRESSOR) 25 MG tablet Take 1 tablet (25 mg total) by mouth 2 (two) times daily. 60 tablet 3  . Multiple Vitamins-Minerals (MULTIVITAMIN PO) Take 1 tablet by mouth daily.    . nitroGLYCERIN (NITROSTAT) 0.4 MG SL tablet Place 1 tablet (0.4 mg total) under the tongue every 5 (five) minutes as needed for chest pain. 25 tablet 3  . omeprazole (PRILOSEC) 20 MG capsule Take 1 capsule (20 mg total) 2 (two) times daily before a meal by mouth. 180 capsule 3   No current facility-administered medications on file prior to visit.  Objective:   Physical Exam Blood pressure (!) 148/80, pulse 64, temperature 97.7 F (36.5 C), height 5\' 2"  (1.575 m), weight 158 lb 1.6 oz (71.7 kg). Alert and oriented. Skin warm and dry. Oral mucosa is moist.   . Sclera anicteric, conjunctivae is pink. Thyroid not enlarged. No cervical lymphadenopathy. Lungs clear. Heart regular rate and rhythm.  Abdomen is soft. Bowel sounds are positive. No hepatomegaly. No abdominal masses felt. No tenderness.  No edema to lower extremities.          Assessment & Plan:  Dysphagia.  She says she really is not having any trouble swallowing now. She needs to watch what she eat as far as  acidic foods. Samples of Nexium given to patient x 7 boxes.

## 2017-07-07 NOTE — Patient Instructions (Signed)
Continue the Omeprazole. May take a Zantac as needed for breakthru GERD

## 2017-07-14 ENCOUNTER — Ambulatory Visit: Payer: Medicare HMO | Admitting: Student

## 2017-07-22 ENCOUNTER — Ambulatory Visit: Payer: Medicare HMO | Admitting: Cardiovascular Disease

## 2017-07-22 ENCOUNTER — Encounter: Payer: Self-pay | Admitting: Cardiovascular Disease

## 2017-07-22 VITALS — BP 117/75 | HR 110 | Ht 62.0 in | Wt 159.8 lb

## 2017-07-22 DIAGNOSIS — I25118 Atherosclerotic heart disease of native coronary artery with other forms of angina pectoris: Secondary | ICD-10-CM | POA: Diagnosis not present

## 2017-07-22 DIAGNOSIS — R079 Chest pain, unspecified: Secondary | ICD-10-CM | POA: Diagnosis not present

## 2017-07-22 DIAGNOSIS — R0609 Other forms of dyspnea: Secondary | ICD-10-CM

## 2017-07-22 DIAGNOSIS — I10 Essential (primary) hypertension: Secondary | ICD-10-CM

## 2017-07-22 DIAGNOSIS — E119 Type 2 diabetes mellitus without complications: Secondary | ICD-10-CM

## 2017-07-22 DIAGNOSIS — R0600 Dyspnea, unspecified: Secondary | ICD-10-CM

## 2017-07-22 DIAGNOSIS — E785 Hyperlipidemia, unspecified: Secondary | ICD-10-CM

## 2017-07-22 MED ORDER — ISOSORBIDE MONONITRATE ER 30 MG PO TB24: 30.0000 mg | ORAL_TABLET | Freq: Two times a day (BID) | ORAL | 1 refills | Status: DC

## 2017-07-22 MED ORDER — METOPROLOL TARTRATE 50 MG PO TABS: 50.0000 mg | ORAL_TABLET | Freq: Two times a day (BID) | ORAL | 1 refills | Status: DC

## 2017-07-22 NOTE — Patient Instructions (Addendum)
Your physician recommends that you schedule a follow-up appointment in: [place] physician has recommended you make the following change in your medication:   INCREASE METOPROLOL 50 MG TWICE DAILY  .INCREASE ISOSORBIDE 30 MG TWICE DAILY  You have been referred to [facility] physician recommends that you return for lab work [place] FAST 6-8 HOURS PRIOR TO LAB WORK   .

## 2017-07-22 NOTE — Progress Notes (Signed)
SUBJECTIVE: The patient presents for follow-up after undergoing cardiac catheterization on 06/11/2017.  This demonstrated chronic total occlusion of the mid RCA collateralized from the LAD and circumflex.  The left main was widely patent.  There is a 30% irregularity in the proximal to mid LAD.  There was a large first diagonal branch with ostial 60% narrowing and mid vessel diffuse 70% stenosis.  Left ventriculography demonstrated inferobasal moderate hypokinesis, LVEF 55%.  LVEDP was normal.  Imdur 30 mg was prescribed after coronary angiography.  It was mentioned that if medical therapy could not control symptoms, she should be referred to our CTO team.  Symptoms have not improved since starting Imdur.  She is able to do simple activities such as walking but she tried dancing last night and had exertional chest pain and dyspnea.  She is also been experiencing chest pain and shortness of breath after eating.  She avoids greasy foods and tries to eat light.  She has also been experiencing palpitations recently.  She would like to walk more and do some exercise but is uncertain about what she is able to do.  She is tried nitroglycerin sublingually on 3 separate occasions and it alleviated her symptoms.      Review of Systems: As per "subjective", otherwise negative.  No Known Allergies  Current Outpatient Medications  Medication Sig Dispense Refill  . albuterol (PROVENTIL) (2.5 MG/3ML) 0.083% nebulizer solution Take 3 mLs (2.5 mg total) every 6 (six) hours as needed by nebulization for wheezing or shortness of breath. 75 mL 3  . aspirin EC 81 MG tablet Take 81 mg by mouth daily.    Marland Kitchen atorvastatin (LIPITOR) 40 MG tablet Take 1 tablet (40 mg total) by mouth daily. (Patient taking differently: Take 40 mg by mouth every evening. ) 90 tablet 3  . CALCIUM PO Take 1 tablet by mouth daily.    . Cholecalciferol (VITAMIN D3) 2000 units TABS Take 4,000 Units by mouth daily.    . fluticasone  furoate-vilanterol (BREO ELLIPTA) 100-25 MCG/INH AEPB Inhale 1 puff daily into the lungs. 3 each 3  . glimepiride (AMARYL) 4 MG tablet Take 1 tablet (4 mg total) daily with breakfast by mouth. (Patient taking differently: Take 2 mg by mouth 2 (two) times daily. ) 90 tablet 3  . isosorbide mononitrate (IMDUR) 30 MG 24 hr tablet Take 1 tablet (30 mg total) by mouth daily. 30 tablet 11  . losartan (COZAAR) 25 MG tablet Take 1 tablet (25 mg total) daily by mouth. 90 tablet 3  . metFORMIN (GLUCOPHAGE) 1000 MG tablet Take 1 tablet (1,000 mg total) 2 (two) times daily with a meal by mouth. 180 tablet 3  . metoprolol tartrate (LOPRESSOR) 25 MG tablet Take 1 tablet (25 mg total) by mouth 2 (two) times daily. 60 tablet 3  . Multiple Vitamins-Minerals (MULTIVITAMIN PO) Take 1 tablet by mouth daily.    . nitroGLYCERIN (NITROSTAT) 0.4 MG SL tablet Place 1 tablet (0.4 mg total) under the tongue every 5 (five) minutes as needed for chest pain. 25 tablet 3  . omeprazole (PRILOSEC) 20 MG capsule Take 1 capsule (20 mg total) 2 (two) times daily before a meal by mouth. 180 capsule 3   No current facility-administered medications for this visit.     Past Medical History:  Diagnosis Date  . COPD (chronic obstructive pulmonary disease) ([place])   . Diabetes mellitus without complication ([place] [facility])   . Early cataracts, bilateral 11/21/2016  . Emphysema of lung (  [facility])   . GERD (gastroesophageal reflux disease)   . Glaucoma   . Hyperlipidemia   . Hypertension   . Sleep apnea     Past Surgical History:  Procedure Laterality Date  . ABDOMINAL HYSTERECTOMY     cervical dysplasia  . BREAST BIOPSY Left   . LEFT HEART CATH AND CORONARY ANGIOGRAPHY N/A 06/11/2017   Procedure: LEFT HEART CATH AND CORONARY ANGIOGRAPHY;  Surgeon: Belva Crome, MD;  Location: [facility];  Service: Cardiovascular;  Laterality: N/A;    Social History   Socioeconomic History  . Marital status: Married    Spouse name: Jenny Reichmann  . Number  of children: 3  . Years of education: 30  . Highest education level: Not on file  Occupational History  . Occupation: retired    Comment: Advertising account planner  Social Needs  . Financial resource strain: Not hard at all  . Food insecurity:    Worry: Never true    Inability: Never true  . Transportation needs:    Medical: No    Non-medical: No  Tobacco Use  . Smoking status: Former Smoker    Packs/day: 1.50    Types: Cigarettes    Start date: 1969    Last attempt to quit: 2007    Years since quitting: 12.5  . Smokeless tobacco: Never Used  Substance and Sexual Activity  . Alcohol use: No    Frequency: Never  . Drug use: No  . Sexual activity: Yes    Birth control/protection: Post-menopausal  Lifestyle  . Physical activity:    Days per week: 4 days    Minutes per session: 30 min  . Stress: Only a little  Relationships  . Social connections:    Talks on phone: Not on file    Gets together: Not on file    Attends religious service: Not on file    Active member of club or organization: Not on file    Attends meetings of clubs or organizations: Not on file    Relationship status: Not on file  . Intimate partner violence:    Fear of current or ex partner: No    Emotionally abused: No    Physically abused: No    Forced sexual activity: No  Other Topics Concern  . Not on file  Social History Narrative   Recent move from [place] to walk   Married to [employer] dog at home     Vitals:   07/22/17 1534  BP: 117/75  Pulse: (!) 110  SpO2: 96%  Weight: 159 lb 12.8 oz (72.5 kg)  Height: 5\' 2"  (1.575 m)    Wt Readings from Last 3 Encounters:  07/22/17 159 lb 12.8 oz (72.5 kg)  07/07/17 158 lb 1.6 oz (71.7 kg)  06/11/17 152 lb (68.9 kg)     PHYSICAL EXAM General: NAD HEENT: Normal. Neck: No JVD, no thyromegaly. Lungs: Clear to auscultation bilaterally with normal respiratory effort. CV: Mildly tachycardic, regular rhythm, normal S1/S2, no S3/S4, no murmur. No  pretibial or periankle edema.  Abdomen: Soft, nontender, no distention.  Neurologic: Alert and oriented.  Psych: Normal affect. Skin: Normal. Musculoskeletal: No gross deformities.    ECG: Reviewed above under Subjective   Labs: Lab Results  Component Value Date/Time   K 4.1 06/09/2017 03:02 PM   BUN 20 06/09/2017 03:02 PM   CREATININE 0.58 06/09/2017 03:02 PM   CREATININE 0.56 11/21/2016 10:12 AM   ALT 12 11/21/2016  10:12 AM   HGB 11.5 (L) 06/09/2017 03:02 PM     Lipids: Lab Results  Component Value Date/Time   LDLCALC 112 (H) 11/21/2016 10:12 AM   CHOL 195 11/21/2016 10:12 AM   TRIG 163 (H) 11/21/2016 10:12 AM   HDL 54 11/21/2016 10:12 AM       ASSESSMENT AND PLAN: 1.  Coronary artery disease with angina: Coronary angiography results detailed above.  Currently on aspirin 81 mg, Lipitor 40 mg, Imdur 30 mg, losartan 25 mg, metoprolol 25 mg twice daily.  I will increase Imdur to 30 mg twice daily and increase metoprolol to 50 mg twice daily.  I will also make a referral to cardiac rehabilitation.  2.  Hypertension: Blood pressure is normal.  Will monitor given medication dose adjustments as noted above.  3.  Type 2 diabetes mellitus: Maintained on metformin and Amaryl.  She is also on Lipitor 40 mg.  4.  Hyperlipidemia: LDL 112 on 11/21/2016.  She is on Lipitor 40 mg.  I will repeat lipids.    Disposition: Follow up 3 months   Kate Sable, M.D., F.A.C.C.

## 2017-08-18 DIAGNOSIS — R69 Illness, unspecified: Secondary | ICD-10-CM | POA: Diagnosis not present

## 2017-08-31 ENCOUNTER — Encounter (HOSPITAL_COMMUNITY): Payer: Self-pay | Admitting: Emergency Medicine

## 2017-08-31 ENCOUNTER — Ambulatory Visit (HOSPITAL_COMMUNITY)
Admission: EM | Admit: 2017-08-31 | Discharge: 2017-08-31 | Disposition: A | Discharge status: Home / Self Care | Payer: Medicare HMO | Attending: Family Medicine | Admitting: Family Medicine

## 2017-08-31 DIAGNOSIS — Z79899 Other long term (current) drug therapy: Secondary | ICD-10-CM | POA: Diagnosis not present

## 2017-08-31 DIAGNOSIS — J449 Chronic obstructive pulmonary disease, unspecified: Secondary | ICD-10-CM | POA: Insufficient documentation

## 2017-08-31 DIAGNOSIS — I25119 Atherosclerotic heart disease of native coronary artery with unspecified angina pectoris: Secondary | ICD-10-CM | POA: Diagnosis not present

## 2017-08-31 DIAGNOSIS — N390 Urinary tract infection, site not specified: Secondary | ICD-10-CM | POA: Diagnosis not present

## 2017-08-31 DIAGNOSIS — G4733 Obstructive sleep apnea (adult) (pediatric): Secondary | ICD-10-CM | POA: Diagnosis not present

## 2017-08-31 DIAGNOSIS — Z7984 Long term (current) use of oral hypoglycemic drugs: Secondary | ICD-10-CM | POA: Diagnosis not present

## 2017-08-31 DIAGNOSIS — Z87891 Personal history of nicotine dependence: Secondary | ICD-10-CM | POA: Insufficient documentation

## 2017-08-31 DIAGNOSIS — I10 Essential (primary) hypertension: Secondary | ICD-10-CM | POA: Diagnosis not present

## 2017-08-31 DIAGNOSIS — E119 Type 2 diabetes mellitus without complications: Secondary | ICD-10-CM | POA: Diagnosis not present

## 2017-08-31 DIAGNOSIS — E785 Hyperlipidemia, unspecified: Secondary | ICD-10-CM | POA: Diagnosis not present

## 2017-08-31 DIAGNOSIS — K210 Gastro-esophageal reflux disease with esophagitis: Secondary | ICD-10-CM | POA: Insufficient documentation

## 2017-08-31 DIAGNOSIS — Z7982 Long term (current) use of aspirin: Secondary | ICD-10-CM | POA: Diagnosis not present

## 2017-08-31 LAB — POCT URINALYSIS DIP (DEVICE)
Bilirubin Urine: NEGATIVE
Glucose, UA: NEGATIVE mg/dL
HGB URINE DIPSTICK: NEGATIVE
Ketones, ur: NEGATIVE mg/dL
NITRITE: NEGATIVE
PH: 7 (ref 5.0–8.0)
PROTEIN: NEGATIVE mg/dL
Specific Gravity, Urine: 1.015 (ref 1.005–1.030)
UROBILINOGEN UA: 1 mg/dL (ref 0.0–1.0)

## 2017-08-31 MED ORDER — NITROFURANTOIN MONOHYD MACRO 100 MG PO CAPS: 100.0000 mg | ORAL_CAPSULE | Freq: Two times a day (BID) | ORAL | 0 refills | Status: DC

## 2017-08-31 NOTE — Discharge Instructions (Signed)
Drink plenty of water Take the antibiotic as directed We did lab testing during this visit.  If there are any abnormal findings that require change in medicine or indicate a positive result, you will be notified.  If all of your tests are normal, you will not be called.   (culture) Follow up if you fail to see improvement in a few days

## 2017-08-31 NOTE — ED Provider Notes (Signed)
[facility]    CSN: [id number] Arrival date & time: 08/31/17  1041     History   Chief Complaint Chief Complaint  Patient presents with  . Urinary Tract Infection    HPI Erica Braun is a 67 y.o. female.   HPI   Patient has had some dysuria and frequency for a couple of days.  On Friday she asked a couple of small blood clots with urination.  She states she did have some minor flank pain on the left.  She thinks she may have had kidney stones in the past but this is uncertain.  No sweats or chills.  No nausea or vomiting.  She has had a history of some urinary tract infections but not for many years.  Past Medical History:  Diagnosis Date  . COPD (chronic obstructive pulmonary disease) ([facility])   . Diabetes mellitus without complication ([place])   . Early cataracts, bilateral 11/21/2016  . Emphysema of lung ([place])   . GERD (gastroesophageal reflux disease)   . Glaucoma   . Hyperlipidemia   . Hypertension   . Sleep apnea     Patient Active Problem List   Diagnosis Date Noted  . CAD in native artery 06/11/2017  . Abnormal nuclear cardiac imaging test 06/11/2017  . Angina pectoris ([facility])   . OSA on CPAP 12/19/2016  . Type 2 diabetes mellitus without complication, without long-term current use of insulin ([place]) 11/21/2016  . GERD with esophagitis 11/21/2016  . History of esophageal stricture 11/21/2016  . Essential hypertension 11/21/2016  . Hyperlipidemia LDL goal <70 11/21/2016  . COPD with emphysema ([place]) 11/21/2016  . Early cataracts, bilateral 11/21/2016    Past Surgical History:  Procedure Laterality Date  . ABDOMINAL HYSTERECTOMY     cervical dysplasia  . BREAST BIOPSY Left   . LEFT HEART CATH AND CORONARY ANGIOGRAPHY N/A 06/11/2017   Procedure: LEFT HEART CATH AND CORONARY ANGIOGRAPHY;  Surgeon: Belva Crome, MD;  Location: [facility];  Service: Cardiovascular;  Laterality: N/A;    OB History   None      Home Medications    Prior  to Admission medications   Medication Sig Start Date End Date Taking? Authorizing Provider  albuterol (PROVENTIL) (2.5 MG/3ML) 0.083% nebulizer solution Take 3 mLs (2.5 mg total) every 6 (six) hours as needed by nebulization for wheezing or shortness of breath. 11/21/16   Raylene Everts, MD  aspirin EC 81 MG tablet Take 81 mg by mouth daily.    [provider]  atorvastatin (LIPITOR) 40 MG tablet Take 1 tablet (40 mg total) by mouth daily. Patient taking differently: Take 40 mg by mouth every evening.  06/09/17   Herminio Commons, MD  CALCIUM PO Take 1 tablet by mouth daily.    [provider]  Cholecalciferol (VITAMIN D3) 2000 units TABS Take 4,000 Units by mouth daily.    [provider]  fluticasone furoate-vilanterol (BREO ELLIPTA) 100-25 MCG/INH AEPB Inhale 1 puff daily into the lungs. 11/21/16   Raylene Everts, MD  glimepiride (AMARYL) 4 MG tablet Take 1 tablet (4 mg total) daily with breakfast by mouth. Patient taking differently: Take 2 mg by mouth 2 (two) times daily.  11/21/16   Raylene Everts, MD  isosorbide mononitrate (IMDUR) 30 MG 24 hr tablet Take 1 tablet (30 mg total) by mouth 2 (two) times daily. 07/22/17 07/22/18  Herminio Commons, MD  losartan (COZAAR) 25 MG tablet Take 1 tablet (25 mg  total) daily by mouth. 11/21/16   Raylene Everts, MD  metFORMIN (GLUCOPHAGE) 1000 MG tablet Take 1 tablet (1,000 mg total) 2 (two) times daily with a meal by mouth. 11/21/16   Raylene Everts, MD  metoprolol tartrate (LOPRESSOR) 50 MG tablet Take 1 tablet (50 mg total) by mouth 2 (two) times daily. 07/22/17 10/20/17  Herminio Commons, MD  Multiple Vitamins-Minerals (MULTIVITAMIN PO) Take 1 tablet by mouth daily.    [provider]  nitrofurantoin, macrocrystal-monohydrate, (MACROBID) 100 MG capsule Take 1 capsule (100 mg total) by mouth 2 (two) times daily. 08/31/17   Raylene Everts, MD  nitroGLYCERIN (NITROSTAT) 0.4 MG SL tablet  Place 1 tablet (0.4 mg total) under the tongue every 5 (five) minutes as needed for chest pain. 06/11/17 06/11/18  Belva Crome, MD  omeprazole (PRILOSEC) 20 MG capsule Take 1 capsule (20 mg total) 2 (two) times daily before a meal by mouth. 11/21/16   Raylene Everts, MD    Family History Family History  Problem Relation Age of Onset  . Cancer Mother        breast  . Arthritis Mother   . COPD Father 71       emphysema  . Alcohol abuse Father   . Diabetes Father   . Cancer Sister        breast  . Early death Brother        overdose  . Drug abuse Brother   . Alcohol abuse Paternal Aunt   . COPD Paternal 53   . Cancer Maternal Grandmother   . Diabetes Maternal Grandmother   . Alcohol abuse Maternal Grandfather   . Early death Maternal Grandfather   . Stroke Paternal Grandmother   . Heart disease Paternal Grandfather     Social History Social History   Tobacco Use  . Smoking status: Former Smoker    Packs/day: 1.50    Types: Cigarettes    Start date: 1969    Last attempt to quit: 2007    Years since quitting: 12.6  . Smokeless tobacco: Never Used  Substance Use Topics  . Alcohol use: No    Frequency: Never  . Drug use: No     Allergies   Patient has no known allergies.   Review of Systems Review of Systems  Constitutional: Negative for chills and fever.  HENT: Negative for ear pain and sore throat.   Eyes: Negative for pain and visual disturbance.  Respiratory: Negative for cough and shortness of breath.   Cardiovascular: Negative for chest pain and palpitations.  Gastrointestinal: Negative for abdominal pain and vomiting.  Genitourinary: Positive for dysuria, flank pain, frequency and hematuria. Negative for pelvic pain and vaginal bleeding.  Musculoskeletal: Negative for arthralgias and back pain.  Skin: Negative for color change and rash.  Neurological: Negative for seizures and syncope.  Psychiatric/Behavioral: Negative for dysphoric mood. The  patient is not nervous/anxious.   All other systems reviewed and are negative.    Physical Exam Triage Vital Signs ED Triage Vitals [08/31/17 1101]  Enc Vitals Group     BP 138/88     Pulse Rate 78     Resp 18     Temp 98.6 F (37 C)     Temp Source Oral     SpO2 100 %   No data found.  Updated Vital Signs BP 138/88 (BP Location: Left Arm)   Pulse 78   Temp 98.6 F (37 C) (Oral)   Resp 18  SpO2 100%      Physical Exam  Constitutional: She appears well-developed and well-nourished. No distress.  HENT:  Head: Normocephalic and atraumatic.  Mouth/Throat: Oropharynx is clear and moist.  Eyes: Pupils are equal, round, and reactive to light. Conjunctivae are normal.  Neck: Normal range of motion.  Cardiovascular: Normal rate, regular rhythm and normal heart sounds.  Pulmonary/Chest: Effort normal and breath sounds normal. No respiratory distress. She has no rales.  Abdominal: Soft. Bowel sounds are normal. She exhibits no distension. There is no tenderness.  No CVA tenderness  Musculoskeletal: Normal range of motion. She exhibits no edema.  Neurological: She is alert.  Skin: Skin is warm and dry.  Psychiatric: She has a normal mood and affect. Her behavior is normal.     UC Treatments / Results  Labs (all labs ordered are listed, but only abnormal results are displayed) Labs Reviewed  POCT URINALYSIS DIP (DEVICE) - Abnormal; Notable for the following components:      Result Value   Leukocytes, UA TRACE (*)    All other components within normal limits  URINE CULTURE    EKG None  Radiology No results found.  Procedures Procedures (including critical care time)  Medications Ordered in UC Medications - No data to display  Initial Impression / Assessment and Plan / UC Course  I have reviewed the triage vital signs and the nursing notes.  Pertinent labs & imaging results that were available during my care of the patient were reviewed by me and considered  in my medical decision making (see chart for details).    Discussed urinary tract infection, treatment.  Reasons to return Final Clinical Impressions(s) / UC Diagnoses   Final diagnoses:  Lower urinary tract infectious disease     Discharge Instructions     Drink plenty of water Take the antibiotic as directed We did lab testing during this visit.  If there are any abnormal findings that require change in medicine or indicate a positive result, you will be notified.  If all of your tests are normal, you will not be called.   (culture) Follow up if you fail to see improvement in a few days   ED Prescriptions    Medication Sig Dispense Auth. Provider   nitrofurantoin, macrocrystal-monohydrate, (MACROBID) 100 MG capsule Take 1 capsule (100 mg total) by mouth 2 (two) times daily. 14 capsule Raylene Everts, MD     Controlled Substance Prescriptions  Controlled Substance Registry consulted? Not Applicable   Raylene Everts, MD 08/31/17 [phone]

## 2017-08-31 NOTE — ED Triage Notes (Signed)
Pt here for UTI sx x 3 days.

## 2017-09-02 LAB — URINE CULTURE: Culture: >=100000 — AB

## 2017-09-03 ENCOUNTER — Telehealth: Payer: Self-pay

## 2017-09-03 NOTE — Telephone Encounter (Signed)
Urine culture positive for Klebsiella Pneumoniae. This is resistant to Macrobid given at [facility] visit. Rx for Bactrim BID x 5 days sent to pharmacy of record. Attempted to reach patient. No answer. Voicemail left. Message sent to mychart.

## 2017-09-07 ENCOUNTER — Telehealth (HOSPITAL_COMMUNITY): Payer: Self-pay

## 2017-09-07 MED ORDER — SULFAMETHOXAZOLE-TRIMETHOPRIM 800-160 MG PO TABS: 1.0000 | ORAL_TABLET | Freq: Two times a day (BID) | ORAL | 0 refills | Status: AC

## 2017-09-29 ENCOUNTER — Telehealth: Payer: Self-pay | Admitting: Family Medicine

## 2017-09-29 NOTE — Telephone Encounter (Signed)
Left message requesting call back. This patient is a former patient of Dr.Nelson and has never established care with Dr.Hagler.

## 2017-09-29 NOTE — Telephone Encounter (Signed)
Mcneil Sober from Medication compliance dept called about patient not filling rx's properly. She has not filled her metformin nor her glimepiride.

## 2017-09-30 NOTE — Telephone Encounter (Signed)
Spoke with patient and she said she is still taking Metformin and Glimperide and had those filled at [facility] in [place] and last had filled for a 90 day supply and has about 30 days left.

## 2017-10-22 ENCOUNTER — Encounter: Payer: Self-pay | Admitting: Cardiovascular Disease

## 2017-10-22 ENCOUNTER — Ambulatory Visit: Payer: Medicare HMO | Admitting: Cardiovascular Disease

## 2017-10-22 VITALS — BP 142/88 | HR 79 | Ht 62.0 in | Wt 163.0 lb

## 2017-10-22 DIAGNOSIS — E119 Type 2 diabetes mellitus without complications: Secondary | ICD-10-CM

## 2017-10-22 DIAGNOSIS — I25118 Atherosclerotic heart disease of native coronary artery with other forms of angina pectoris: Secondary | ICD-10-CM

## 2017-10-22 DIAGNOSIS — I10 Essential (primary) hypertension: Secondary | ICD-10-CM | POA: Diagnosis not present

## 2017-10-22 DIAGNOSIS — J449 Chronic obstructive pulmonary disease, unspecified: Secondary | ICD-10-CM | POA: Diagnosis not present

## 2017-10-22 DIAGNOSIS — E785 Hyperlipidemia, unspecified: Secondary | ICD-10-CM

## 2017-10-22 DIAGNOSIS — R5383 Other fatigue: Secondary | ICD-10-CM | POA: Diagnosis not present

## 2017-10-22 DIAGNOSIS — R0609 Other forms of dyspnea: Secondary | ICD-10-CM

## 2017-10-22 DIAGNOSIS — R0600 Dyspnea, unspecified: Secondary | ICD-10-CM

## 2017-10-22 NOTE — Progress Notes (Signed)
SUBJECTIVE: The patient presents for follow-up of coronary artery disease.  Cardiac catheterization on 06/11/2017 demonstrated chronic total occlusion of the mid RCA collateralized from the LAD and circumflex.  The left main was widely patent.  There is a 30% irregularity in the proximal to mid LAD.  There was a large first diagonal branch with ostial 60% narrowing and mid vessel diffuse 70% stenosis.  Left ventriculography demonstrated inferobasal moderate hypokinesis, LVEF 55%.  LVEDP was normal.  Imdur 30 mg was prescribed after coronary angiography.  It was mentioned that if medical therapy could not control symptoms, she should be referred to our CTO team.  She denies chest pain but does have exertional dyspnea and fatigue.  She said her energy levels have declined but not markedly so since her last visit with me.  She has a history of emphysema and has not seen a pulmonologist in quite some time.  She has been bothered by ragweed and seasonal allergies.  She has been expensing some neuropathic leg pain.  Other complaints relate to dysphasia for hot liquids and carbonated beverages.  She underwent a normal barium swallow on 04/03/2017 and follows with gastroenterology.  They gave her samples of Nexium but she did not use them as she takes omeprazole.  She is in the process of switching PCPs.     Review of Systems: As per "subjective", otherwise negative.  No Known Allergies  Current Outpatient Medications  Medication Sig Dispense Refill  . albuterol (PROVENTIL) (2.5 MG/3ML) 0.083% nebulizer solution Take 3 mLs (2.5 mg total) every 6 (six) hours as needed by nebulization for wheezing or shortness of breath. 75 mL 3  . aspirin EC 81 MG tablet Take 81 mg by mouth daily.    Marland Kitchen atorvastatin (LIPITOR) 40 MG tablet Take 1 tablet (40 mg total) by mouth daily. (Patient taking differently: Take 40 mg by mouth every evening. ) 90 tablet 3  . CALCIUM PO Take 1 tablet by mouth daily.    .  Cholecalciferol (VITAMIN D3) 2000 units TABS Take 4,000 Units by mouth daily.    . fluticasone furoate-vilanterol (BREO ELLIPTA) 100-25 MCG/INH AEPB Inhale 1 puff daily into the lungs. 3 each 3  . glimepiride (AMARYL) 4 MG tablet Take 1 tablet (4 mg total) daily with breakfast by mouth. (Patient taking differently: Take 2 mg by mouth 2 (two) times daily. ) 90 tablet 3  . isosorbide mononitrate (IMDUR) 30 MG 24 hr tablet Take 1 tablet (30 mg total) by mouth 2 (two) times daily. 180 tablet 1  . losartan (COZAAR) 25 MG tablet Take 1 tablet (25 mg total) daily by mouth. 90 tablet 3  . metFORMIN (GLUCOPHAGE) 1000 MG tablet Take 1 tablet (1,000 mg total) 2 (two) times daily with a meal by mouth. 180 tablet 3  . Multiple Vitamins-Minerals (MULTIVITAMIN PO) Take 1 tablet by mouth daily.    . nitrofurantoin, macrocrystal-monohydrate, (MACROBID) 100 MG capsule Take 1 capsule (100 mg total) by mouth 2 (two) times daily. 14 capsule 0  . nitroGLYCERIN (NITROSTAT) 0.4 MG SL tablet Place 1 tablet (0.4 mg total) under the tongue every 5 (five) minutes as needed for chest pain. 25 tablet 3  . omeprazole (PRILOSEC) 20 MG capsule Take 1 capsule (20 mg total) 2 (two) times daily before a meal by mouth. 180 capsule 3  . metoprolol tartrate (LOPRESSOR) 50 MG tablet Take 1 tablet (50 mg total) by mouth 2 (two) times daily. 180 tablet 1   No current  facility-administered medications for this visit.     Past Medical History:  Diagnosis Date  . COPD (chronic obstructive pulmonary disease) ([place])   . Diabetes mellitus without complication ([place])   . Early cataracts, bilateral 11/21/2016  . Emphysema of lung ([place])   . GERD (gastroesophageal reflux disease)   . Glaucoma   . Hyperlipidemia   . Hypertension   . Sleep apnea     Past Surgical History:  Procedure Laterality Date  . ABDOMINAL HYSTERECTOMY     cervical dysplasia  . BREAST BIOPSY Left   . LEFT HEART CATH AND CORONARY ANGIOGRAPHY N/A 06/11/2017   Procedure:  LEFT HEART CATH AND CORONARY ANGIOGRAPHY;  Surgeon: Belva Crome, MD;  Location: [facility];  Service: Cardiovascular;  Laterality: N/A;    Social History   Socioeconomic History  . Marital status: Married    Spouse name: Jenny Reichmann  . Number of children: 3  . Years of education: 110  . Highest education level: Not on file  Occupational History  . Occupation: retired    Comment: Advertising account planner  Social Needs  . Financial resource strain: Not hard at all  . Food insecurity:    Worry: Never true    Inability: Never true  . Transportation needs:    Medical: No    Non-medical: No  Tobacco Use  . Smoking status: Former Smoker    Packs/day: 1.50    Types: Cigarettes    Start date: 1969    Last attempt to quit: 2007    Years since quitting: 12.8  . Smokeless tobacco: Never Used  Substance and Sexual Activity  . Alcohol use: No    Frequency: Never  . Drug use: No  . Sexual activity: Yes    Birth control/protection: Post-menopausal  Lifestyle  . Physical activity:    Days per week: 4 days    Minutes per session: 30 min  . Stress: Only a little  Relationships  . Social connections:    Talks on phone: Not on file    Gets together: Not on file    Attends religious service: Not on file    Active member of club or organization: Not on file    Attends meetings of clubs or organizations: Not on file    Relationship status: Not on file  . Intimate partner violence:    Fear of current or ex partner: No    Emotionally abused: No    Physically abused: No    Forced sexual activity: No  Other Topics Concern  . Not on file  Social History Narrative   Recent move from [place] to walk   Married to [employer] dog at home     Vitals:   10/22/17 1406  BP: (!) 142/88  Pulse: 79  SpO2: 98%  Weight: 163 lb (73.9 kg)  Height: 5\' 2"  (1.575 m)    Wt Readings from Last 3 Encounters:  10/22/17 163 lb (73.9 kg)  07/22/17 159 lb 12.8 oz (72.5 kg)  07/07/17 158 lb 1.6 oz  (71.7 kg)     PHYSICAL EXAM General: NAD HEENT: Normal. Neck: No JVD, no thyromegaly. Lungs: Diminished sounds throughout without crackles or wheezes. CV: Regular rate and rhythm, normal S1/S2, no S3/S4, no murmur. No pretibial or periankle edema.     Abdomen: Soft, nontender, no distention.  Neurologic: Alert and oriented.  Psych: Normal affect. Skin: Normal. Musculoskeletal: No gross deformities.    ECG: Reviewed above under  Subjective   Labs: Lab Results  Component Value Date/Time   K 4.1 06/09/2017 03:02 PM   BUN 20 06/09/2017 03:02 PM   CREATININE 0.58 06/09/2017 03:02 PM   CREATININE 0.56 11/21/2016 10:12 AM   ALT 12 11/21/2016 10:12 AM   HGB 11.5 (L) 06/09/2017 03:02 PM     Lipids: Lab Results  Component Value Date/Time   LDLCALC 112 (H) 11/21/2016 10:12 AM   CHOL 195 11/21/2016 10:12 AM   TRIG 163 (H) 11/21/2016 10:12 AM   HDL 54 11/21/2016 10:12 AM       ASSESSMENT AND PLAN:  1.  Coronary artery disease with exertional dyspnea and fatigue: Coronary angiography results detailed above.  Currently on aspirin 81 mg, Lipitor 40 mg, Imdur 30 mg twice daily, losartan 25 mg, metoprolol 50 mg twice daily.  Increasing Imdur and metoprolol at her last visit did not diminish her symptoms.  It is unclear to me if her symptoms could be due to her COPD.  For this reason I will make a pulmonary referral and obtain pulmonary function testing.  If her COPD is deemed fairly stable overall, I would then have her see one of my interventional colleagues for consideration of chronic total occlusion intervention.  2.  Hypertension: Blood pressure is normal.    No changes.  3.  Type 2 diabetes mellitus: Maintained on metformin and Amaryl.  She is also on Lipitor 40 mg.  4.  Hyperlipidemia: LDL 112 on 11/21/2016.  She is on Lipitor 40 mg.  I will repeat lipids.  5.  Shortness of breath/COPD: Please see discussion and #1.  I will make a pulmonary referral and obtain pulmonary  function testing.  6.  Dysphagia: Normal barium swallow in March 2019 as detailed above.  Follows with GI.   Disposition: Follow up 3 months  Time spent: 40 minutes, of which greater than 50% was spent reviewing symptoms, relevant blood tests and studies, and discussing management plan with the patient.    Kate Sable, M.D., F.A.C.C.

## 2017-10-22 NOTE — Patient Instructions (Addendum)
Medication Instructions:  Continue all current medications.  Labwork: none  Testing/Procedures: none  Follow-Up: 3 months  Any Other Special Instructions Will Be Listed Below (If Applicable). You have been referred to:  Pulmonology - Dr. Simonne Maffucci  If you need a refill on your cardiac medications before your next appointment, please call your pharmacy.

## 2017-10-26 ENCOUNTER — Other Ambulatory Visit: Payer: Self-pay | Admitting: *Deleted

## 2017-10-26 DIAGNOSIS — E785 Hyperlipidemia, unspecified: Secondary | ICD-10-CM

## 2017-10-29 ENCOUNTER — Other Ambulatory Visit (HOSPITAL_COMMUNITY)
Admission: RE | Admit: 2017-10-29 | Discharge: 2017-10-29 | Disposition: A | Discharge status: Home / Self Care | Payer: Medicare HMO | Source: Ambulatory Visit | Attending: Cardiovascular Disease | Admitting: Cardiovascular Disease

## 2017-10-29 DIAGNOSIS — E785 Hyperlipidemia, unspecified: Secondary | ICD-10-CM | POA: Insufficient documentation

## 2017-10-30 ENCOUNTER — Other Ambulatory Visit (HOSPITAL_COMMUNITY)
Admission: RE | Admit: 2017-10-30 | Discharge: 2017-10-30 | Disposition: A | Discharge status: Home / Self Care | Payer: Medicare HMO | Source: Ambulatory Visit | Attending: Cardiovascular Disease | Admitting: Cardiovascular Disease

## 2017-10-30 DIAGNOSIS — E785 Hyperlipidemia, unspecified: Secondary | ICD-10-CM | POA: Insufficient documentation

## 2017-10-30 LAB — LIPID PANEL
CHOL/HDL RATIO: 3.6 ratio
Cholesterol: 142 mg/dL (ref 0–200)
HDL: 40 mg/dL — AB (ref >40)
LDL Cholesterol: 64 mg/dL (ref 0–99)
TRIGLYCERIDES: 188 mg/dL — AB (ref <150)
VLDL: 38 mg/dL (ref 0–40)

## 2017-11-01 DIAGNOSIS — R69 Illness, unspecified: Secondary | ICD-10-CM | POA: Diagnosis not present

## 2017-11-03 ENCOUNTER — Encounter: Payer: Self-pay | Admitting: Pulmonary Disease

## 2017-11-03 ENCOUNTER — Telehealth: Payer: Self-pay | Admitting: Pulmonary Disease

## 2017-11-03 ENCOUNTER — Ambulatory Visit: Payer: Medicare HMO | Admitting: Pulmonary Disease

## 2017-11-03 VITALS — BP 144/80 | HR 84 | Ht 62.0 in | Wt 163.0 lb

## 2017-11-03 DIAGNOSIS — J449 Chronic obstructive pulmonary disease, unspecified: Secondary | ICD-10-CM

## 2017-11-03 DIAGNOSIS — Z87891 Personal history of nicotine dependence: Secondary | ICD-10-CM

## 2017-11-03 DIAGNOSIS — G4733 Obstructive sleep apnea (adult) (pediatric): Secondary | ICD-10-CM

## 2017-11-03 NOTE — Patient Instructions (Addendum)
Continue Breo We will evaluate you with a chest x-ray, pulmonary function test and a split-night sleep study We will try to get some records from your primary care doctor in [place] about your lung function test and immunizations Return to clinic in 2 to 4 weeks.

## 2017-11-03 NOTE — Telephone Encounter (Signed)
Medical records request has been faxed to Dr. Newman Pies for lung function test and immunizations Will await records

## 2017-11-03 NOTE — Progress Notes (Signed)
Erica Braun    [id number]    [birth year]  Primary Care Physician:Patient, No Pcp Per  Referring Physician: No referring provider defined for this encounter.  Chief complaint: Consult for COPD, sleep apnea  HPI: 67 year old with history of COPD, coronary artery disease, diabetes, sleep apnea Cardiac catheterization in June 2019 showing coronary artery disease.  She is on medical management for this but continues to have significant dyspnea on exertion.  She is being considered for coronary intervention and has been referred to pulmonary for evaluation of COPD before proceeding with that.  Diagnosed with COPD around 2005 in [place]. Maintained on Breo CT scans in [place] are significant for granulomas and no interstitial lung disease. She also has been diagnosed with sleep apnea in [place] and was on CPAP which she had to stop while moving here. Complains of dyspnea on exertion, mild dyspnea at rest, chronic cough with white mucus.  Pets: No pets Occupation: Retired Advertising account planner Exposures: No known exposures, no mold, hot tub, Jacuzzi Smoking history: 45-pack-year smoker.  Quit in 2010 Travel history: Grew up in the [place] in [place] for over 30 years.  Moved to [place] in 2018. Relevant family history: Father had emphysema, he was a smoker.  Outpatient Encounter Medications as of 11/03/2017  Medication Sig  . albuterol (PROVENTIL) (2.5 MG/3ML) 0.083% nebulizer solution Take 3 mLs (2.5 mg total) every 6 (six) hours as needed by nebulization for wheezing or shortness of breath.  Marland Kitchen aspirin EC 81 MG tablet Take 81 mg by mouth daily.  Marland Kitchen atorvastatin (LIPITOR) 40 MG tablet Take 1 tablet (40 mg total) by mouth daily. (Patient taking differently: Take 40 mg by mouth every evening. )  . CALCIUM PO Take 1 tablet by mouth daily.  . Cholecalciferol (VITAMIN D3) 2000 units TABS Take 4,000 Units by mouth daily.  . fluticasone furoate-vilanterol (BREO ELLIPTA) 100-25  MCG/INH AEPB Inhale 1 puff daily into the lungs.  Marland Kitchen glimepiride (AMARYL) 4 MG tablet Take 1 tablet (4 mg total) daily with breakfast by mouth. (Patient taking differently: Take 2 mg by mouth 2 (two) times daily. )  . isosorbide mononitrate (IMDUR) 30 MG 24 hr tablet Take 1 tablet (30 mg total) by mouth 2 (two) times daily.  Marland Kitchen losartan (COZAAR) 25 MG tablet Take 1 tablet (25 mg total) daily by mouth.  . metFORMIN (GLUCOPHAGE) 1000 MG tablet Take 1 tablet (1,000 mg total) 2 (two) times daily with a meal by mouth.  . Multiple Vitamins-Minerals (MULTIVITAMIN PO) Take 1 tablet by mouth daily.  . nitrofurantoin, macrocrystal-monohydrate, (MACROBID) 100 MG capsule Take 1 capsule (100 mg total) by mouth 2 (two) times daily.  . nitroGLYCERIN (NITROSTAT) 0.4 MG SL tablet Place 1 tablet (0.4 mg total) under the tongue every 5 (five) minutes as needed for chest pain.  Marland Kitchen omeprazole (PRILOSEC) 20 MG capsule Take 1 capsule (20 mg total) 2 (two) times daily before a meal by mouth.  . metoprolol tartrate (LOPRESSOR) 50 MG tablet Take 1 tablet (50 mg total) by mouth 2 (two) times daily.   No facility-administered encounter medications on file as of 11/03/2017.     Allergies as of 11/03/2017  . (No Known Allergies)    Past Medical History:  Diagnosis Date  . COPD (chronic obstructive pulmonary disease) ([place])   . Diabetes mellitus without complication ([place])   . Early cataracts, bilateral 11/21/2016  . Emphysema of lung ([place])   . GERD (gastroesophageal reflux disease)   .  Glaucoma   . Hyperlipidemia   . Hypertension   . Sleep apnea     Past Surgical History:  Procedure Laterality Date  . ABDOMINAL HYSTERECTOMY     cervical dysplasia  . BREAST BIOPSY Left   . LEFT HEART CATH AND CORONARY ANGIOGRAPHY N/A 06/11/2017   Procedure: LEFT HEART CATH AND CORONARY ANGIOGRAPHY;  Surgeon: Belva Crome, MD;  Location: [facility];  Service: Cardiovascular;  Laterality: N/A;    Family History  Problem  Relation Age of Onset  . Cancer Mother        breast  . Arthritis Mother   . COPD Father 32       emphysema  . Alcohol abuse Father   . Diabetes Father   . Cancer Sister        breast  . Early death Brother        overdose  . Drug abuse Brother   . Alcohol abuse Paternal Aunt   . COPD Paternal 21   . Cancer Maternal Grandmother   . Diabetes Maternal Grandmother   . Alcohol abuse Maternal Grandfather   . Early death Maternal Grandfather   . Stroke Paternal Grandmother   . Heart disease Paternal Grandfather     Social History   Socioeconomic History  . Marital status: Married    Spouse name: Jenny Reichmann  . Number of children: 3  . Years of education: 72  . Highest education level: Not on file  Occupational History  . Occupation: retired    Comment: Advertising account planner  Social Needs  . Financial resource strain: Not hard at all  . Food insecurity:    Worry: Never true    Inability: Never true  . Transportation needs:    Medical: No    Non-medical: No  Tobacco Use  . Smoking status: Former Smoker    Packs/day: 1.50    Years: 30.00    Pack years: 45.00    Types: Cigarettes    Start date: 1969    Last attempt to quit: 2010    Years since quitting: 9.8  . Smokeless tobacco: Never Used  Substance and Sexual Activity  . Alcohol use: No    Frequency: Never  . Drug use: No  . Sexual activity: Yes    Birth control/protection: Post-menopausal  Lifestyle  . Physical activity:    Days per week: 4 days    Minutes per session: 30 min  . Stress: Only a little  Relationships  . Social connections:    Talks on phone: Not on file    Gets together: Not on file    Attends religious service: Not on file    Active member of club or organization: Not on file    Attends meetings of clubs or organizations: Not on file    Relationship status: Not on file  . Intimate partner violence:    Fear of current or ex partner: No    Emotionally abused: No    Physically abused: No    Forced  sexual activity: No  Other Topics Concern  . Not on file  Social History Narrative   Recent move from [place] to walk   Married to [employer]  at home   Review of systems: Review of Systems  Constitutional: Negative for fever and chills.  HENT: Negative.   Eyes: Negative for blurred vision.  Respiratory: as per HPI  Cardiovascular: Negative for chest pain and palpitations.  Gastrointestinal: Negative  for vomiting, diarrhea, blood per rectum. Genitourinary: Negative for dysuria, urgency, frequency and hematuria.  Musculoskeletal: Negative for myalgias, back pain and joint pain.  Skin: Negative for itching and rash.  Neurological: Negative for dizziness, tremors, focal weakness, seizures and loss of consciousness.  Endo/Heme/Allergies: Negative for environmental allergies.  Psychiatric/Behavioral: Negative for depression, suicidal ideas and hallucinations.  All other systems reviewed and are negative.  Physical Exam: Blood pressure (!) 144/80, pulse 84, height 5\' 2"  (1.575 m), weight 163 lb (73.9 kg), SpO2 95 %. Gen:      No acute distress HEENT:  EOMI, sclera anicteric Neck:     No masses; no thyromegaly Lungs:    Clear to auscultation bilaterally; normal respiratory effort CV:         Regular rate and rhythm; no murmurs Abd:      + bowel sounds; soft, non-tender; no palpable masses, no distension Ext:    No edema; adequate peripheral perfusion Skin:      Warm and dry; no rash Neuro: alert and oriented x 3 Psych: normal mood and affect  Data Reviewed: Imaging: CT report 10/11/2015- nonspecific subcentimeter mediastinal and hilar lymph nodes, 4 mm calcified granuloma in the right upper lobe, tree-in-bud nodularity in the right middle lobe, coronary atherosclerosis  CT report 03/14/2016- interval resolution of right middle lobe tree-in-bud opacity, stable 6 mm right upper lobe and 2 mm right middle lobe granuloma.  PFTs Pending  Labs: CBC 06/09/17-WBC 5, eos 2%,  absolute eosinophil count 100  Assessment:  COPD Continue Breo for now Evaluate with chest x-ray, pulmonary function test  Ex-smoker Refer for low-dose screening CT  Sleep apnea Currently not on treatment.  Order split-night sleep study.  Plan/Recommendations: - Chest x-ray, PFTs - Low-dose screening CT - Split-night sleep study - Continue Arneta Cliche MD [facility] 11/03/2017, 2:53 PM  CC: No ref. provider found

## 2017-11-05 ENCOUNTER — Other Ambulatory Visit: Payer: Self-pay | Admitting: Acute Care

## 2017-11-05 DIAGNOSIS — Z122 Encounter for screening for malignant neoplasm of respiratory organs: Secondary | ICD-10-CM

## 2017-11-05 DIAGNOSIS — Z87891 Personal history of nicotine dependence: Secondary | ICD-10-CM

## 2017-11-13 NOTE — Telephone Encounter (Signed)
Called Dr. Nelida Gores office and requested that records be faxed to our office, as records have not been received as of yet.

## 2017-11-15 ENCOUNTER — Ambulatory Visit: Payer: Medicare HMO | Attending: Pulmonary Disease | Admitting: Pulmonary Disease

## 2017-11-15 DIAGNOSIS — G4733 Obstructive sleep apnea (adult) (pediatric): Secondary | ICD-10-CM | POA: Diagnosis not present

## 2017-11-19 NOTE — Telephone Encounter (Signed)
Contacted Dr. Doristine Johns x2 and requested that record be re faxed, as our office has not received records.

## 2017-11-23 ENCOUNTER — Ambulatory Visit (INDEPENDENT_AMBULATORY_CARE_PROVIDER_SITE_OTHER)
Admission: RE | Admit: 2017-11-23 | Discharge: 2017-11-23 | Disposition: A | Discharge status: Home / Self Care | Payer: Medicare HMO | Source: Ambulatory Visit | Attending: Acute Care | Admitting: Acute Care

## 2017-11-23 ENCOUNTER — Ambulatory Visit (INDEPENDENT_AMBULATORY_CARE_PROVIDER_SITE_OTHER): Payer: Medicare HMO | Admitting: Acute Care

## 2017-11-23 ENCOUNTER — Encounter: Payer: Self-pay | Admitting: Acute Care

## 2017-11-23 VITALS — BP 140/74 | HR 83 | Ht 62.0 in | Wt 161.4 lb

## 2017-11-23 DIAGNOSIS — Z87891 Personal history of nicotine dependence: Secondary | ICD-10-CM

## 2017-11-23 DIAGNOSIS — Z122 Encounter for screening for malignant neoplasm of respiratory organs: Secondary | ICD-10-CM

## 2017-11-23 NOTE — Progress Notes (Signed)
Shared Decision Making Visit Lung Cancer Screening Program [phone])   Eligibility:  Age 67 y.o.  Pack Years Smoking History Calculation 61.5 pack year smoking history (# packs/per year x # years smoked)   Recent History of coughing up blood  no  Unexplained weight loss? no ( >Than 15 pounds within the last 6 months )  Prior History Lung / other cancer no (Diagnosis within the last 5 years already requiring surveillance chest CT Scans).  Smoking Status Former Smoker  Former Smokers: Years since quit: 9 years  Quit Date: 2010  Visit Components:  Discussion included one or more decision making aids. yes  Discussion included risk/benefits of screening. yes  Discussion included potential follow up diagnostic testing for abnormal scans. yes  Discussion included meaning and risk of over diagnosis. yes  Discussion included meaning and risk of False Positives. yes  Discussion included meaning of total radiation exposure. yes  Counseling Included:  Importance of adherence to annual lung cancer LDCT screening. yes  Impact of comorbidities on ability to participate in the program. yes  Ability and willingness to under diagnostic treatment. yes  Smoking Cessation Counseling:  Current Smokers:   Discussed importance of smoking cessation. NA former smoker  Information about tobacco cessation classes and interventions provided to patient. yes  Patient provided with "ticket" for LDCT Scan. yes  Symptomatic Patient. no  Counseling  Diagnosis Code: Tobacco Use Z72.0  Asymptomatic Patient yes  Counseling (Intermediate counseling: > three minutes counseling) N0272  Former Smokers:   Discussed the importance of maintaining cigarette abstinence. yes  Diagnosis Code: Personal History of Nicotine Dependence. Z36.644  Information about tobacco cessation classes and interventions provided to patient. Yes  Patient provided with "ticket" for LDCT Scan. yes  Written Order  for Lung Cancer Screening with LDCT placed in Epic. Yes (CT Chest Lung Cancer Screening Low Dose W/O CM) [id number] Z12.2-Screening of respiratory organs Z87.891-Personal history of nicotine dependence  I spent 25 minutes of face to face time with Erica Braun discussing the risks and benefits of lung cancer screening. We viewed a power point together that explained in detail the above noted topics. We took the time to pause the power point at intervals to allow for questions to be asked and answered to ensure understanding. We discussed that she  had taken the single most powerful action possible to decrease her risk of developing lung cancer when she quit smoking. I counseled her to remain smoke free, and to contact me if she ever had the desire to smoke again so that I can provide resources and tools to help support the effort to remain smoke free. We discussed the time and location of the scan, and that either  Doroteo Glassman RN or I will call with the results within  24-48 hours of receiving them. She has my card and contact information in the event she needs to speak with me, in addition to a copy of the power point we reviewed as a resource. She verbalized understanding of all of the above and had no further questions upon leaving the office.     I explained to the patient that there has been a high incidence of coronary artery disease noted on these exams. I explained that this is a non-gated exam therefore degree or severity cannot be determined. This patient is on statin therapy. I have asked the patient to follow-up with their PCP regarding any incidental finding of coronary artery disease and management with diet or medication as they  feel is clinically indicated. The patient verbalized understanding of the above and had no further questions.      Erica Spatz, NP  11/23/2017 3:56 PM

## 2017-11-24 NOTE — Procedures (Signed)
    Patient Name: Erica Braun, Amun[place]on Date: 11/15/2017 Gender: Female D.O.B: 12/12/1950 Age (years): 89 Referring Provider: Marshell Garfinkel Height (inches): 62 Interpreting Physician: Chesley Mires MD, ABSM Weight (lbs): 163 RPSGT: Rosebud Poles BMI: 30 MRN: [id number] Neck Size: 16.00  CLINICAL INFORMATION  Sleep Study Type: NPSG  Indication for sleep study: OSA  Epworth Sleepiness Score: 13  SLEEP STUDY TECHNIQUE  As per the AASM Manual for the Scoring of Sleep and Associated Events v2.3 (April 2016) with a hypopnea requiring 4% desaturations. The channels recorded and monitored were frontal, central and occipital EEG, electrooculogram (EOG), submentalis EMG (chin), nasal and oral airflow, thoracic and abdominal wall motion, anterior tibialis EMG, snore microphone, electrocardiogram, and pulse oximetry. MEDICATIONS  Medications self-administered by patient taken the night of the study : N/A SLEEP ARCHITECTURE  The study was initiated at 9:43:27 PM and ended at 4:39:17 AM. Sleep onset time was 11.7 minutes and the sleep efficiency was 86.8%%. The total sleep time was 361 minutes. Stage REM latency was 72.5 minutes. The patient spent 2.4%% of the night in stage N1 sleep, 57.8%% in stage N2 sleep, 15.2%% in stage N3 and 24.7% in REM. Alpha intrusion was absent. Supine sleep was 0.00%. RESPIRATORY PARAMETERS  The overall apnea/hypopnea index (AHI) was 11.1 per hour. There were 6 total apneas, including 6 obstructive, 0 central and 0 mixed apneas. There were 61 hypopneas and 0 RERAs. The AHI during Stage REM sleep was 35.1 per hour. AHI while supine was N/A per hour. The mean oxygen saturation was 89.8%. The minimum SpO2 during sleep was 74.0%. moderate snoring was noted during this study. CARDIAC DATA  The 2 lead EKG demonstrated sinus rhythm. The mean heart rate was N/A beats per minute. Other EKG findings include: PVCs. LEG MOVEMENT DATA  The total PLMS were 7 with  a resulting PLMS index of 1.2. Associated arousal with leg movement index was 0.7 . IMPRESSIONS  - Mild obstructive sleep apnea with an AHI of 11.1 and SpO2 low of 74%. DIAGNOSIS  - Obstructive Sleep Apnea (327.23 [G47.33 ICD-10]) RECOMMENDATIONS  - Additional therapies include weight loss, CPAP, oral appliance, or surgical assessment.  [Electronically signed] 11/24/2017 02:59 PM  Chesley Mires MD, [place], American Board of Sleep Medicine  NPI: [id number]

## 2017-11-26 ENCOUNTER — Ambulatory Visit: Payer: Medicare HMO | Admitting: Pulmonary Disease

## 2017-11-27 ENCOUNTER — Telehealth: Payer: Self-pay | Admitting: Acute Care

## 2017-11-27 DIAGNOSIS — Z87891 Personal history of nicotine dependence: Secondary | ICD-10-CM

## 2017-11-27 DIAGNOSIS — Z122 Encounter for screening for malignant neoplasm of respiratory organs: Secondary | ICD-10-CM

## 2017-11-27 NOTE — Telephone Encounter (Signed)
Pt informed of CT results per Sarah Groce, NP.  PT verbalized understanding.  Copy sent to PCP.  Order placed for 1 yr f/u CT.  

## 2017-12-02 NOTE — Telephone Encounter (Signed)
Contacted Dr. Modesta Messing office x3 and requested records. Records have not been received as of yet.

## 2017-12-09 NOTE — Telephone Encounter (Signed)
Records have not been received as of yet.  Contacted Dr. Modesta Messing and spoke to [facility] and requested that records be faxed to our office.

## 2017-12-10 NOTE — Telephone Encounter (Signed)
Records have been received and placed in Dr. Vaughan Browner look at folder.  Nothing further is needed.

## 2017-12-11 ENCOUNTER — Ambulatory Visit: Payer: Medicare HMO | Admitting: Pulmonary Disease

## 2017-12-11 ENCOUNTER — Encounter: Payer: Self-pay | Admitting: Pulmonary Disease

## 2017-12-11 ENCOUNTER — Ambulatory Visit (INDEPENDENT_AMBULATORY_CARE_PROVIDER_SITE_OTHER): Payer: Medicare HMO | Admitting: Pulmonary Disease

## 2017-12-11 VITALS — BP 118/80 | HR 77 | Ht 61.0 in | Wt 159.0 lb

## 2017-12-11 DIAGNOSIS — J449 Chronic obstructive pulmonary disease, unspecified: Secondary | ICD-10-CM | POA: Diagnosis not present

## 2017-12-11 DIAGNOSIS — G4733 Obstructive sleep apnea (adult) (pediatric): Secondary | ICD-10-CM | POA: Diagnosis not present

## 2017-12-11 DIAGNOSIS — Z23 Encounter for immunization: Secondary | ICD-10-CM

## 2017-12-11 LAB — PULMONARY FUNCTION TEST
DL/VA % PRED: 75 %
DL/VA: 3.31 ml/min/mmHg/L
DLCO UNC % PRED: 59 %
DLCO unc: 11.95 ml/min/mmHg
FEF 25-75 POST: 0.54 L/s
FEF 25-75 Pre: 0.37 L/sec
FEF2575-%CHANGE-POST: 47 %
FEF2575-%Pred-Post: 29 %
FEF2575-%Pred-Pre: 20 %
FEV1-%CHANGE-POST: 12 %
FEV1-%PRED-PRE: 42 %
FEV1-%Pred-Post: 47 %
FEV1-POST: 0.98 L
FEV1-Pre: 0.88 L
FEV1FVC-%CHANGE-POST: 2 %
FEV1FVC-%PRED-PRE: 76 %
FEV6-%Change-Post: 8 %
FEV6-%Pred-Post: 62 %
FEV6-%Pred-Pre: 58 %
FEV6-PRE: 1.5 L
FEV6-Post: 1.63 L
FEV6FVC-%Change-Post: 0 %
FEV6FVC-%PRED-PRE: 104 %
FEV6FVC-%Pred-Post: 103 %
FVC-%CHANGE-POST: 9 %
FVC-%PRED-POST: 60 %
FVC-%Pred-Pre: 55 %
FVC-PRE: 1.5 L
FVC-Post: 1.64 L
PRE FEV6/FVC RATIO: 100 %
Post FEV1/FVC ratio: 60 %
Post FEV6/FVC ratio: 99 %
Pre FEV1/FVC ratio: 58 %
RV % PRED: 171 %
RV: 3.42 L
TLC % pred: 108 %
TLC: 5.01 L

## 2017-12-11 LAB — CBC WITH DIFFERENTIAL/PLATELET
BASOS ABS: 0 10*3/uL (ref 0.0–0.1)
Basophils Relative: 0.7 % (ref 0.0–3.0)
Eosinophils Absolute: 0.1 10*3/uL (ref 0.0–0.7)
Eosinophils Relative: 2.2 % (ref 0.0–5.0)
HCT: 34 % — ABNORMAL LOW (ref 36.0–46.0)
Hemoglobin: 11.5 g/dL — ABNORMAL LOW (ref 12.0–15.0)
Lymphocytes Relative: 24.6 % (ref 12.0–46.0)
Lymphs Abs: 1 10*3/uL (ref 0.7–4.0)
MCHC: 33.7 g/dL (ref 30.0–36.0)
MCV: 87.5 fl (ref 78.0–100.0)
Monocytes Absolute: 0.5 10*3/uL (ref 0.1–1.0)
Monocytes Relative: 10.9 % (ref 3.0–12.0)
Neutro Abs: 2.6 10*3/uL (ref 1.4–7.7)
Neutrophils Relative %: 61.6 % (ref 43.0–77.0)
PLATELETS: 161 10*3/uL (ref 150.0–400.0)
RBC: 3.89 Mil/uL (ref 3.87–5.11)
RDW: 15.1 % (ref 11.5–15.5)
WBC: 4.2 10*3/uL (ref 4.0–10.5)

## 2017-12-11 MED ORDER — FLUTICASONE-UMECLIDIN-VILANT 100-62.5-25 MCG/INH IN AEPB: 1.0000 | INHALATION_SPRAY | Freq: Every day | RESPIRATORY_TRACT | 0 refills | Status: AC

## 2017-12-11 MED ORDER — FLUTICASONE-UMECLIDIN-VILANT 100-62.5-25 MCG/INH IN AEPB: 1.0000 | INHALATION_SPRAY | Freq: Every day | RESPIRATORY_TRACT | 5 refills | Status: AC

## 2017-12-11 NOTE — Progress Notes (Signed)
Erica Braun    [id number]    01-28-1950  Primary Care Physician:Vincent, Berlin Hun, MD  Referring Physician: Marshell Garfinkel, [address] [place], [place] [address]  Chief complaint: Follow up for COPD, sleep apnea  HPI: 67 year old with history of COPD, coronary artery disease, diabetes, sleep apnea Cardiac catheterization in June 2019 showing coronary artery disease.  She is on medical management for this but continues to have significant dyspnea on exertion.  She is being considered for coronary intervention and has been referred to pulmonary for evaluation of COPD before proceeding with that.  Diagnosed with COPD around 2005 in [place]. Maintained on Breo CT scans in [place] are significant for granulomas and no interstitial lung disease. She also has been diagnosed with sleep apnea in [place] and was on CPAP which she had to stop while moving here. Complains of dyspnea on exertion, mild dyspnea at rest, chronic cough with white mucus.  Pets: No pets Occupation: Retired Advertising account planner Exposures: No known exposures, no mold, hot tub, Jacuzzi Smoking history: 45-pack-year smoker.  Quit in 2010 Travel history: Grew up in the [place] in [place] for over 30 years.  Moved to [place] in 2018. Relevant family history: Father had emphysema, he was a smoker.  Interim history: Continues on Breo.  She feels that is not working as well for her anymore.  Has chronic dyspnea on exertion, cough with mucus production. She had a sleep study, PFTs and screening CT and is here for review of results.  Outpatient Encounter Medications as of 12/11/2017  Medication Sig  . albuterol (PROVENTIL) (2.5 MG/3ML) 0.083% nebulizer solution Take 3 mLs (2.5 mg total) every 6 (six) hours as needed by nebulization for wheezing or shortness of breath.  Marland Kitchen aspirin EC 81 MG tablet Take 81 mg by mouth daily.  Marland Kitchen atorvastatin (LIPITOR) 40 MG tablet Take 1 tablet (40 mg total) by mouth  daily. (Patient taking differently: Take 40 mg by mouth every evening. )  . CALCIUM PO Take 1 tablet by mouth daily.  . Cholecalciferol (VITAMIN D3) 2000 units TABS Take 4,000 Units by mouth daily.  . fluticasone furoate-vilanterol (BREO ELLIPTA) 100-25 MCG/INH AEPB Inhale 1 puff daily into the lungs.  Marland Kitchen glimepiride (AMARYL) 4 MG tablet Take 1 tablet (4 mg total) daily with breakfast by mouth. (Patient taking differently: Take 2 mg by mouth 2 (two) times daily. )  . isosorbide mononitrate (IMDUR) 30 MG 24 hr tablet Take 1 tablet (30 mg total) by mouth 2 (two) times daily.  Marland Kitchen losartan (COZAAR) 25 MG tablet Take 1 tablet (25 mg total) daily by mouth.  . metFORMIN (GLUCOPHAGE) 1000 MG tablet Take 1 tablet (1,000 mg total) 2 (two) times daily with a meal by mouth.  . Multiple Vitamins-Minerals (MULTIVITAMIN PO) Take 1 tablet by mouth daily.  . nitroGLYCERIN (NITROSTAT) 0.4 MG SL tablet Place 1 tablet (0.4 mg total) under the tongue every 5 (five) minutes as needed for chest pain.  Marland Kitchen omeprazole (PRILOSEC) 20 MG capsule Take 1 capsule (20 mg total) 2 (two) times daily before a meal by mouth.  . metoprolol tartrate (LOPRESSOR) 50 MG tablet Take 1 tablet (50 mg total) by mouth 2 (two) times daily.  . [DISCONTINUED] nitrofurantoin, macrocrystal-monohydrate, (MACROBID) 100 MG capsule Take 1 capsule (100 mg total) by mouth 2 (two) times daily. (Patient not taking: Reported on 12/11/2017)   No facility-administered encounter medications on file as of 12/11/2017.  Physical Exam: Blood pressure 118/80, pulse 77, height 5\' 1"  (1.549 m), weight 159 lb (72.1 kg), SpO2 96 %. Gen:      No acute distress HEENT:  EOMI, sclera anicteric Neck:     No masses; no thyromegaly Lungs:    Clear to auscultation bilaterally; normal respiratory effort CV:         Regular rate and rhythm; no murmurs Abd:      + bowel sounds; soft, non-tender; no palpable masses, no distension Ext:    No edema; adequate peripheral  perfusion Skin:      Warm and dry; no rash Neuro: alert and oriented x 3 Psych: normal mood and affect  Data Reviewed: Imaging: CT report 10/11/2015- nonspecific subcentimeter mediastinal and hilar lymph nodes, 4 mm calcified granuloma in the right upper lobe, tree-in-bud nodularity in the right middle lobe, coronary atherosclerosis  CT report 03/14/2016- interval resolution of right middle lobe tree-in-bud opacity, stable 6 mm right upper lobe and 2 mm right middle lobe granuloma.  Screening CT chest 11/23/2017- mild emphysema with bronchial wall thickening.  Right upper lobe calcified granuloma.  Granulomatous splenic calcifications.  Three-vessel coronary artery atherosclerosis  PFTs 03/02/2015 [from [place]] FVC 2.46 [88%), FEV1 1.23 [57%], F/F 50 Moderate obstruction  12/11/2017 FVC 1.64 [60%], FEV1 0.98 [47%), F/F 60, TLC 108%, DLCO 59% Severe obstruction with moderate diffusion defect.  Labs: CBC 06/09/17-WBC 5, eos 2%, absolute eosinophil count 100  Sleep PSG 11/15/2017-mild sleep apnea, AHI 11.1, low O2 sats of 74%.  Assessment:  COPD PFTs reviewed with severe obstruction, air trapping. She still symptomatic on Breo.  Will switch her to Trelegy inhaler. Check oxygen levels on exertion.  Ex-smoker Continue annual low-dose screening CTs.  Sleep apnea Has mild sleep apnea with desaturations on sleep study Refer for CPAP and oxygen titration  Health maintenance 11/01/2017-influenza Give Pneumovax today.  Plan/Recommendations: - Stop Breo, start Trelegy - CBC differential, alpha-1 antitrypsin levels, IgE - Continue annual CT - CPAP titration - Pneumovax  Marshell Garfinkel MD [facility] 12/11/2017, 1:40 PM  CC: Marshell Garfinkel, MD

## 2017-12-11 NOTE — Progress Notes (Signed)
PFT done today. 

## 2017-12-11 NOTE — Addendum Note (Signed)
Addended by: Suzzanne Cloud E on: 12/11/2017 02:34 PM   Modules accepted: Orders

## 2017-12-11 NOTE — Patient Instructions (Addendum)
We will start you on a new inhaler called Trelegy.  This will replace the Breo inhaler Check oxygen levels on exertion today Refer you for CPAP, oxygen titration given diagnosis of sleep apnea, desaturations Will check baseline blood test today including CBC differential, alpha-1 antitrypsin levels and phenotype, IgE levels.  We will give Pneumovax today Follow-up in 3 months.

## 2017-12-17 ENCOUNTER — Encounter: Payer: Self-pay | Admitting: Pediatrics

## 2017-12-17 ENCOUNTER — Ambulatory Visit (INDEPENDENT_AMBULATORY_CARE_PROVIDER_SITE_OTHER): Payer: Medicare HMO | Admitting: Pediatrics

## 2017-12-17 VITALS — BP 132/73 | HR 69 | Temp 97.1°F | Ht 61.0 in | Wt 163.2 lb

## 2017-12-17 DIAGNOSIS — G4733 Obstructive sleep apnea (adult) (pediatric): Secondary | ICD-10-CM | POA: Diagnosis not present

## 2017-12-17 DIAGNOSIS — I2510 Atherosclerotic heart disease of native coronary artery without angina pectoris: Secondary | ICD-10-CM

## 2017-12-17 DIAGNOSIS — K2100 Gastro-esophageal reflux disease with esophagitis, without bleeding: Secondary | ICD-10-CM

## 2017-12-17 DIAGNOSIS — I10 Essential (primary) hypertension: Secondary | ICD-10-CM

## 2017-12-17 DIAGNOSIS — Z9989 Dependence on other enabling machines and devices: Secondary | ICD-10-CM

## 2017-12-17 DIAGNOSIS — E785 Hyperlipidemia, unspecified: Secondary | ICD-10-CM

## 2017-12-17 DIAGNOSIS — J431 Panlobular emphysema: Secondary | ICD-10-CM | POA: Diagnosis not present

## 2017-12-17 DIAGNOSIS — E119 Type 2 diabetes mellitus without complications: Secondary | ICD-10-CM | POA: Diagnosis not present

## 2017-12-17 LAB — ALPHA-1 ANTITRYPSIN PHENOTYPE: A-1 Antitrypsin, Ser: 179 mg/dL (ref 83–199)

## 2017-12-17 LAB — IGE: IgE (Immunoglobulin E), Serum: 91 kU/L (ref <=114)

## 2017-12-17 MED ORDER — OMEPRAZOLE 20 MG PO CPDR: 20.0000 mg | DELAYED_RELEASE_CAPSULE | Freq: Two times a day (BID) | ORAL | 3 refills | Status: DC

## 2017-12-17 MED ORDER — METFORMIN HCL 1000 MG PO TABS: 1000.0000 mg | ORAL_TABLET | Freq: Two times a day (BID) | ORAL | 3 refills | Status: DC

## 2017-12-17 MED ORDER — GLIMEPIRIDE 2 MG PO TABS: 2.0000 mg | ORAL_TABLET | Freq: Every day | ORAL | 1 refills | Status: DC

## 2017-12-17 MED ORDER — LOSARTAN POTASSIUM 25 MG PO TABS: 25.0000 mg | ORAL_TABLET | Freq: Every day | ORAL | 3 refills | Status: DC

## 2017-12-17 NOTE — Progress Notes (Signed)
Subjective:   Patient ID: Erica Braun, female    DOB: 1950-10-17, 67 y.o.   MRN: [id number] CC: New Patient (Initial Visit) Follow-up multiple medical problems HPI: Erica Braun is a 67 y.o. female   COPD: recently switched to trelegy by pulm. Has not needed rescue inhaler for about a week. Needs it when she is more active than usual.  Coronary artery disease: no chest pain in the past few weeks. Following with cardiology. Known obstructive lesion that could be causing symptoms versus COPD.  Obstructive sleep apnea: has sleep test coming up this week. Not using CPAP at home now. Waking up not being able to breath.  Hyperlipidemia: tolerating statin  Diabetes: taking medicine regularly, last A1c 6 about a year ago  Hypertension: Stable, taking medicines regularly  GERD: Anytime she eats or drinks anything she has pain when he passes mid chest.  Cool or hot foods tend to make it worse.  She has to stop what she is doing until the pain passes. Has had esophageal dilation in the past. Has had scarring on EGD in the past in [place] per pt.   H/o tobacco use: getting yearly CT screens. Was on oxygen in past, not using now  Past Medical History:  Diagnosis Date  . COPD (chronic obstructive pulmonary disease) ([place])   . Diabetes mellitus without complication ([place])   . Early cataracts, bilateral 11/21/2016  . Emphysema of lung ([place])   . GERD (gastroesophageal reflux disease)   . Glaucoma   . Hyperlipidemia   . Hypertension   . Sleep apnea    Family History  Problem Relation Age of Onset  . Cancer Mother        breast  . Arthritis Mother   . COPD Father 64       emphysema  . Alcohol abuse Father   . Diabetes Father   . Cancer Sister        breast  . Early death Brother        overdose  . Drug abuse Brother   . Alcohol abuse Paternal Aunt   . COPD Paternal 54   . Cancer Maternal Grandmother   . Diabetes Maternal Grandmother   . Alcohol abuse Maternal Grandfather     . Early death Maternal Grandfather   . Stroke Paternal Grandmother   . Heart disease Paternal Grandfather    Social History   Socioeconomic History  . Marital status: Married    Spouse name: Erica Braun  . Number of children: 3  . Years of education: 74  . Highest education level: Not on file  Occupational History  . Occupation: retired    Comment: Advertising account planner  Social Needs  . Financial resource strain: Not hard at all  . Food insecurity:    Worry: Never true    Inability: Never true  . Transportation needs:    Medical: No    Non-medical: No  Tobacco Use  . Smoking status: Former Smoker    Packs/day: 1.50    Years: 41.00    Pack years: 61.50    Types: Cigarettes    Start date: 1969    Last attempt to quit: 2010    Years since quitting: 9.9  . Smokeless tobacco: Never Used  Substance and Sexual Activity  . Alcohol use: No    Frequency: Never  . Drug use: No  . Sexual activity: Yes    Birth control/protection: Post-menopausal  Lifestyle  . Physical activity:    Days per week: 4  days    Minutes per session: 30 min  . Stress: Only a little  Relationships  . Social connections:    Talks on phone: Not on file    Gets together: Not on file    Attends religious service: Not on file    Active member of club or organization: Not on file    Attends meetings of clubs or organizations: Not on file    Relationship status: Not on file  Other Topics Concern  . Not on file  Social History Narrative   Recent move from [place] to walk   Married to [employer]  at home   ROS: All systems negative other than what is in HPI  Objective:    BP 132/73   Pulse 69   Temp (!) 97.1 F (36.2 C) (Oral)   Ht 5' 1"  (1.549 m)   Wt 163 lb 3.2 oz (74 kg)   BMI 30.84 kg/m   Wt Readings from Last 3 Encounters:  12/17/17 163 lb 3.2 oz (74 kg)  12/11/17 159 lb (72.1 kg)  11/23/17 161 lb 6.4 oz (73.2 kg)    Gen: NAD, alert, cooperative with exam, NCAT EYES: EOMI, no  conjunctival injection, or no icterus CV: NRRR, normal S1/S2, no murmur, distal pulses 2+ b/l Resp: CTABL, no wheezes, normal WOB Abd:  soft, NTND Ext: No edema, warm Neuro: Alert and oriented, strength equal b/l UE and LE, coordination grossly normal MSK: normal muscle bulk  Assessment & Plan:  Carsyn was seen today for new patient (initial visit) and follow-up multiple medical problems.  Diagnoses and all orders for this visit:  GERD with esophagitis Symptomatic on Prilosec.  Will refer for further evaluation -     Ambulatory referral to Gastroenterology -     omeprazole (PRILOSEC) 20 MG capsule; Take 1 capsule (20 mg total) by mouth 2 (two) times daily before a meal.  Essential hypertension Stable, continue -     BMP8+EGFR -     losartan (COZAAR) 25 MG tablet; Take 1 tablet (25 mg total) by mouth daily.  Panlobular emphysema ([place]) Following with pulmonology.  Recently started on Trelegy.  Type 2 diabetes mellitus without complication, without long-term current use of insulin (HCC) Overdue for A1c, last was 6.0. -     Bayer DCA Hb A1c Waived -     metFORMIN (GLUCOPHAGE) 1000 MG tablet; Take 1 tablet (1,000 mg total) by mouth 2 (two) times daily with a meal. -     glimepiride (AMARYL) 2 MG tablet; Take 1 tablet (2 mg total) by mouth daily with breakfast.  CAD in native artery With cardiology.  Would like to be more active than she is.  Has an appointment coming up next month.  OSA on CPAP Sleep study later this week  Hyperlipidemia LDL goal <70 Stable, tolerating statin, continue  Follow up plan: Return in about 3 months (around 03/18/2018). Assunta Found, MD [place]

## 2017-12-18 LAB — BMP8+EGFR
BUN/Creatinine Ratio: 22 (ref 12–28)
BUN: 12 mg/dL (ref 8–27)
CO2: 24 mmol/L (ref 20–29)
Calcium: 9.1 mg/dL (ref 8.7–10.3)
Chloride: 103 mmol/L (ref 96–106)
Creatinine, Ser: 0.55 mg/dL — ABNORMAL LOW (ref 0.57–1.00)
GFR calc Af Amer: 111 mL/min/{1.73_m2} (ref >59)
GFR calc non Af Amer: 97 mL/min/{1.73_m2} (ref >59)
Glucose: 121 mg/dL — ABNORMAL HIGH (ref 65–99)
Potassium: 4.3 mmol/L (ref 3.5–5.2)
Sodium: 143 mmol/L (ref 134–144)

## 2017-12-18 LAB — BAYER DCA HB A1C WAIVED: HB A1C (BAYER DCA - WAIVED): 5.7 % (ref <7.0)

## 2017-12-20 ENCOUNTER — Ambulatory Visit: Payer: Medicare HMO | Attending: Pulmonary Disease | Admitting: Pulmonary Disease

## 2017-12-20 DIAGNOSIS — G4733 Obstructive sleep apnea (adult) (pediatric): Secondary | ICD-10-CM | POA: Insufficient documentation

## 2017-12-20 DIAGNOSIS — I493 Ventricular premature depolarization: Secondary | ICD-10-CM | POA: Diagnosis not present

## 2017-12-20 DIAGNOSIS — J449 Chronic obstructive pulmonary disease, unspecified: Secondary | ICD-10-CM | POA: Insufficient documentation

## 2017-12-22 NOTE — Procedures (Signed)
    Patient Name: Erica Braun, Geoffroy Date: 12/20/2017 Gender: Female D.O.B: May 18, 1950 Age (years): 53 Referring Provider: Marshell Garfinkel Height (inches): 36 Interpreting Physician: Chesley Mires MD, ABSM Weight (lbs): 163 RPSGT: Rosebud Poles BMI: 30 MRN: [id number] Neck Size: 16.00 <br> <br>  CLINICAL INFORMATION  67 year old with history of COPD and obstructive sleep apnea.  She had PSG 11/15/2017 with an AHI 11.1, low O2 sats of 74%.  She presents for a CPAP titration study. SLEEP STUDY TECHNIQUE  As per the AASM Manual for the Scoring of Sleep and Associated Events v2.3 (April 2016) with a hypopnea requiring 4% desaturations. The channels recorded and monitored were frontal, central and occipital EEG, electrooculogram (EOG), submentalis EMG (chin), nasal and oral airflow, thoracic and abdominal wall motion, anterior tibialis EMG, snore microphone, electrocardiogram, and pulse oximetry. Continuous positive airway pressure (CPAP) was initiated at the beginning of the study and titrated to treat sleep-disordered breathing. MEDICATIONS  Medications self-administered by patient taken the night of the study : N/A TECHNICIAN COMMENTS  Comments added by technician: Patient tolerated CPAP therapy very well. CPAP therapy started at 4 cm of H20 and only titrated to 11 cm of H20 due to an increase in events during REM-SUPINE stage Comments added by scorer: N/A RESPIRATORY PARAMETERS  Optimal PAP Pressure (cm): 11 AHI at Optimal Pressure (/hr): 0.0 Overall Minimal O2 (%): 81.0 Supine % at Optimal Pressure (%): 2 Minimal O2 at Optimal Pressure (%): 87.0   SLEEP ARCHITECTURE  The study was initiated at 10:28:12 PM and ended at 5:11:37 AM. Sleep onset time was 7.8 minutes and the sleep efficiency was 94.5%%. The total sleep time was 381.1 minutes. The patient spent 2.5%% of the night in stage N1 sleep, 60.4%% in stage N2 sleep, 24.0%% in stage N3 and 13.1% in REM.Stage REM  latency was 64.5 minutes Wake after sleep onset was 14.5. Alpha intrusion was absent. Supine sleep was 21.91%. CARDIAC DATA  The 2 lead EKG demonstrated sinus rhythm. The mean heart rate was 76.7 beats per minute. Other EKG findings include: PVCs. LEG MOVEMENT DATA  The total Periodic Limb Movements of Sleep (PLMS) were 0. The PLMS index was 0.0. A PLMS index of <15 is considered normal in adults. IMPRESSIONS  - She did well with CPAP 11 cm H2O.  She was observed in REM sleep with this pressure setting. - She did not require the use of supplemental oxygen during this study. DIAGNOSIS  - Obstructive Sleep Apnea (327.23 [G47.33 ICD-10]) RECOMMENDATIONS  - Trial of CPAP therapy on 11 cm H2O with a Small size Resmed Nasal Mask Airfit N20 mask and heated humidification.  [Electronically signed] 12/22/2017 08:14 AM  Chesley Mires MD, ABSM Diplomate, American Board of Sleep Medicine  NPI: [id number]

## 2017-12-23 ENCOUNTER — Telehealth: Payer: Self-pay

## 2017-12-23 DIAGNOSIS — G4733 Obstructive sleep apnea (adult) (pediatric): Secondary | ICD-10-CM

## 2017-12-23 NOTE — Telephone Encounter (Signed)
-----   Message from Marshell Garfinkel, MD sent at 12/22/2017 11:50 AM EST ----- CPAP titration study reviewed  Order CPAP therapy on 11 cm H2O with a Small size Resmed Nasal Mask Airfit N20 mask and heated humidification. She did not need supplemental O2 ----- Message ----- From: Chesley Mires, MD Sent: 12/22/2017   8:15 AM EST To: Eustaquio Maize, MD, Marshell Garfinkel, MD

## 2017-12-23 NOTE — Telephone Encounter (Signed)
Pt is aware of results and voiced her understanding. Order has been placed for cpap therapy. Pt has pending OV for 03-15-18. Nothing further is needed.

## 2017-12-24 ENCOUNTER — Telehealth: Payer: Self-pay | Admitting: Pulmonary Disease

## 2017-12-24 NOTE — Telephone Encounter (Signed)
Called [facility] and spoke with Danae Chen who stated that pt's insurance is out of network with them. Pt will need to be switched to a different DME.  PCCs, can you please help [place] out with this for pt? Thank you!

## 2017-12-24 NOTE — Telephone Encounter (Signed)
Attempted to call patient today regarding DME will now be [facility]. I did not receive an answer at time of call. I have left a voicemail message for pt to return call. X1

## 2017-12-24 NOTE — Telephone Encounter (Signed)
Sent to [facility]

## 2017-12-28 NOTE — Telephone Encounter (Signed)
Called and spoke with Patient.  Explained that she will now be using [facility] instead of [facility] for CPAP.  Understanding stated.  Nothing further at this time.

## 2018-01-08 DIAGNOSIS — G4733 Obstructive sleep apnea (adult) (pediatric): Secondary | ICD-10-CM | POA: Diagnosis not present

## 2018-01-14 ENCOUNTER — Telehealth: Payer: Self-pay | Admitting: Internal Medicine

## 2018-01-14 ENCOUNTER — Encounter: Payer: Self-pay | Admitting: Internal Medicine

## 2018-01-14 NOTE — Telephone Encounter (Signed)
[facility] for consult appt with me

## 2018-01-14 NOTE — Telephone Encounter (Signed)
Pt scheduled for consult on 03/03/18 at 1:30pm.

## 2018-01-14 NOTE — Telephone Encounter (Signed)
Hi Dr. Hilarie Fredrickson , we have received a referral from pt's PCP for GERD, pt was seen at [facility] last year. Patient would like to transfer her GI care over to [place] because she has not been pleased with the care that she received. Her Gi records are in [place]. Could you please review them and let me know if it is ok to schedule patient for a consult with you? Thank you.

## 2018-01-26 ENCOUNTER — Telehealth: Payer: Self-pay | Admitting: Cardiovascular Disease

## 2018-01-26 NOTE — Telephone Encounter (Signed)
Called patient and LVM to call and confirm appt scheduled 01-27-2018.

## 2018-01-27 ENCOUNTER — Ambulatory Visit: Payer: Medicare HMO | Admitting: Cardiovascular Disease

## 2018-01-27 ENCOUNTER — Encounter: Payer: Self-pay | Admitting: Cardiovascular Disease

## 2018-01-27 VITALS — BP 110/68 | HR 82 | Ht 62.0 in | Wt 162.0 lb

## 2018-01-27 DIAGNOSIS — R1310 Dysphagia, unspecified: Secondary | ICD-10-CM | POA: Diagnosis not present

## 2018-01-27 DIAGNOSIS — I10 Essential (primary) hypertension: Secondary | ICD-10-CM | POA: Diagnosis not present

## 2018-01-27 DIAGNOSIS — E785 Hyperlipidemia, unspecified: Secondary | ICD-10-CM | POA: Diagnosis not present

## 2018-01-27 DIAGNOSIS — E119 Type 2 diabetes mellitus without complications: Secondary | ICD-10-CM

## 2018-01-27 DIAGNOSIS — I25118 Atherosclerotic heart disease of native coronary artery with other forms of angina pectoris: Secondary | ICD-10-CM | POA: Diagnosis not present

## 2018-01-27 DIAGNOSIS — J449 Chronic obstructive pulmonary disease, unspecified: Secondary | ICD-10-CM | POA: Diagnosis not present

## 2018-01-27 NOTE — Patient Instructions (Addendum)

## 2018-01-27 NOTE — Progress Notes (Signed)
SUBJECTIVE: The patient presents for routine follow-up.  She was evaluated by pulmonary in December 2019.  PFT showed severe obstruction with air trapping and she was switched from [name] to Trelegy inhaler.  She underwent CPAP titration.  In summary, cardiac catheterization on 06/11/2017 demonstrated chronic total occlusion of the mid RCA collateralized from the LAD and circumflex. The left main was widely patent. There is a 30% irregularity in the proximal to mid LAD. There was a large first diagonal branch with ostial 60% narrowing and mid vessel diffuse 70% stenosis. Left ventriculography demonstrated inferobasal moderate hypokinesis, LVEF 55%. LVEDP was normal.  Imdur 30 mg was prescribed after coronary angiography. It was mentioned that if medical therapy could not control symptoms, she should be referred to our CTO team.  She feels much better and said both Trelegy and CPAP have helped a lot.  She walks in the [employer] program and can walk 1.5 and sometimes up to 1.9 miles without difficulty.  She is planning to do water aerobics as well.  She does some light aerobics as part of the class.  She has not had to use nitroglycerin.  She has pain in her chest after swallowing solids or liquids and is scheduled to see GI in the near future.   Review of Systems: As per "subjective", otherwise negative.  No Known Allergies  Current Outpatient Medications  Medication Sig Dispense Refill  . albuterol (PROVENTIL) (2.5 MG/3ML) 0.083% nebulizer solution Take 3 mLs (2.5 mg total) every 6 (six) hours as needed by nebulization for wheezing or shortness of breath. 75 mL 3  . aspirin EC 81 MG tablet Take 81 mg by mouth daily.    Marland Kitchen atorvastatin (LIPITOR) 40 MG tablet Take 1 tablet (40 mg total) by mouth daily. (Patient taking differently: Take 40 mg by mouth every evening. ) 90 tablet 3  . CALCIUM PO Take 1 tablet by mouth daily.    . Cholecalciferol (VITAMIN D3) 2000 units TABS Take  4,000 Units by mouth daily.    Marland Kitchen glimepiride (AMARYL) 2 MG tablet Take 1 tablet (2 mg total) by mouth daily with breakfast. 90 tablet 1  . isosorbide mononitrate (IMDUR) 30 MG 24 hr tablet Take 1 tablet (30 mg total) by mouth 2 (two) times daily. 180 tablet 1  . losartan (COZAAR) 25 MG tablet Take 1 tablet (25 mg total) by mouth daily. 90 tablet 3  . metFORMIN (GLUCOPHAGE) 1000 MG tablet Take 1 tablet (1,000 mg total) by mouth 2 (two) times daily with a meal. 180 tablet 3  . Multiple Vitamins-Minerals (MULTIVITAMIN PO) Take 1 tablet by mouth daily.    . nitroGLYCERIN (NITROSTAT) 0.4 MG SL tablet Place 1 tablet (0.4 mg total) under the tongue every 5 (five) minutes as needed for chest pain. 25 tablet 3  . omeprazole (PRILOSEC) 20 MG capsule Take 1 capsule (20 mg total) by mouth 2 (two) times daily before a meal. 180 capsule 3  . metoprolol tartrate (LOPRESSOR) 50 MG tablet Take 1 tablet (50 mg total) by mouth 2 (two) times daily. 180 tablet 1   No current facility-administered medications for this visit.     Past Medical History:  Diagnosis Date  . COPD (chronic obstructive pulmonary disease) ([place])   . Diabetes mellitus without complication ([place])   . Early cataracts, bilateral 11/21/2016  . Emphysema of lung ([place])   . GERD (gastroesophageal reflux disease)   . Glaucoma   . Hyperlipidemia   . Hypertension   .  Sleep apnea     Past Surgical History:  Procedure Laterality Date  . ABDOMINAL HYSTERECTOMY     cervical dysplasia  . BREAST BIOPSY Left   . LEFT HEART CATH AND CORONARY ANGIOGRAPHY N/A 06/11/2017   Procedure: LEFT HEART CATH AND CORONARY ANGIOGRAPHY;  Surgeon: Belva Crome, MD;  Location: [facility];  Service: Cardiovascular;  Laterality: N/A;    Social History   Socioeconomic History  . Marital status: Married    Spouse name: Jenny Reichmann  . Number of children: 3  . Years of education: 61  . Highest education level: Not on file  Occupational History  . Occupation:  retired    Comment: Advertising account planner  Social Needs  . Financial resource strain: Not hard at all  . Food insecurity:    Worry: Never true    Inability: Never true  . Transportation needs:    Medical: No    Non-medical: No  Tobacco Use  . Smoking status: Former Smoker    Packs/day: 1.50    Years: 41.00    Pack years: 61.50    Types: Cigarettes    Start date: 1969    Last attempt to quit: 2010    Years since quitting: 10.0  . Smokeless tobacco: Never Used  Substance and Sexual Activity  . Alcohol use: No    Frequency: Never  . Drug use: No  . Sexual activity: Yes    Birth control/protection: Post-menopausal  Lifestyle  . Physical activity:    Days per week: 4 days    Minutes per session: 30 min  . Stress: Only a little  Relationships  . Social connections:    Talks on phone: Not on file    Gets together: Not on file    Attends religious service: Not on file    Active member of club or organization: Not on file    Attends meetings of clubs or organizations: Not on file    Relationship status: Not on file  . Intimate partner violence:    Fear of current or ex partner: No    Emotionally abused: No    Physically abused: No    Forced sexual activity: No  Other Topics Concern  . Not on file  Social History Narrative   Recent move from [place] to walk   Married to [employer] dog at home     Vitals:   01/27/18 1354  BP: 110/68  Pulse: 82  SpO2: 98%  Weight: 162 lb (73.5 kg)  Height: 5\' 2"  (1.575 m)    Wt Readings from Last 3 Encounters:  01/27/18 162 lb (73.5 kg)  12/17/17 163 lb 3.2 oz (74 kg)  12/11/17 159 lb (72.1 kg)     PHYSICAL EXAM General: NAD HEENT: Normal. Neck: No JVD, no thyromegaly. Lungs: Clear bilaterally. CV: Regular rate and rhythm, normal S1/S2, no S3/S4, no murmur. No pretibial or periankle edema.  No carotid bruit.   Abdomen: Soft, nontender, no distention.  Neurologic: Alert and oriented.  Psych: Normal affect. Skin:  Normal. Musculoskeletal: No gross deformities.    ECG: Reviewed above under Subjective   Labs: Lab Results  Component Value Date/Time   K 4.3 12/17/2017 09:43 AM   BUN 12 12/17/2017 09:43 AM   CREATININE 0.55 (L) 12/17/2017 09:43 AM   CREATININE 0.56 11/21/2016 10:12 AM   ALT 12 11/21/2016 10:12 AM   HGB 11.5 (L) 12/11/2017 02:34 PM     Lipids: Lab Results  Component Value Date/Time   LDLCALC 64 10/30/2017 10:00 AM   LDLCALC 112 (H) 11/21/2016 10:12 AM   CHOL 142 10/30/2017 10:00 AM   TRIG 188 (H) 10/30/2017 10:00 AM   HDL 40 (L) 10/30/2017 10:00 AM       ASSESSMENT AND PLAN: 1. Coronary artery disease: Symptomatically stable.  Coronary angiography results detailed above. Currently on aspirin 81 mg, Lipitor 40 mg, Imdur 30 mg twice daily, losartan 25 mg, metoprolol 50 mg twice daily.  Increasing Imdur and metoprolol at a prior visit did not diminish her symptoms.  I referred her to pulmonary as reviewed above and she has severe COPD.  She is doing very well now.  No changes to therapy.  2. Hypertension: Blood pressure is normal.   No changes.  3. Type 2 diabetes mellitus: Maintained on metformin and Amaryl. She is also on Lipitor 40 mg.  4. Hyperlipidemia: Lipids reviewed above and LDL at goal, 64 on 10/30/2017.  Continue atorvastatin 40 mg.  5.    COPD: Symptoms have significantly improved.  Now on Trelegy inhaler.  Managed by pulmonary.  7.  Dysphagia for solids and liquids: Soon to see GI.    Disposition: Follow up 6 months   Kate Sable, M.D., F.A.C.C.

## 2018-02-08 ENCOUNTER — Encounter: Payer: Self-pay | Admitting: *Deleted

## 2018-02-08 DIAGNOSIS — G4733 Obstructive sleep apnea (adult) (pediatric): Secondary | ICD-10-CM | POA: Diagnosis not present

## 2018-03-03 ENCOUNTER — Encounter: Payer: Self-pay | Admitting: Internal Medicine

## 2018-03-03 ENCOUNTER — Ambulatory Visit: Payer: Medicare HMO | Admitting: Internal Medicine

## 2018-03-03 VITALS — BP 126/72 | HR 84 | Ht 61.0 in | Wt 160.1 lb

## 2018-03-03 DIAGNOSIS — R1310 Dysphagia, unspecified: Secondary | ICD-10-CM

## 2018-03-03 MED ORDER — FLUCONAZOLE 100 MG PO TABS: ORAL_TABLET | ORAL | 0 refills | Status: DC

## 2018-03-03 NOTE — Progress Notes (Signed)
Patient ID: Erica Braun, female   DOB: 1950/06/17, 68 y.o.   MRN: [id number] HPI: Erica Braun is a 68 year old female with a past medical history of COPD, GERD, colonic diverticulosis, hypertension, hyperlipidemia sleep apnea who is seen in consult by Dr. Evette Doffing to evaluate odynophagia.  She is here alone today.  She reports that she has had pain with swallowing in her upper chest and substernal area over the last 18 months which is getting worse.  This does not seem to matter whether is liquid or solid.  Liquids can be hot or cold and the pain is definitively associating with eating/swallowing.  Can happen throughout a meal or be more predominant for the first 3 or 4 bites.  Happens just about every day.  Will often wait after swallowing until pain improves before eating again.  Occasionally feels like food sticks in her esophagus.  Rare nausea no vomiting.  No abdominal pain.  No change in bowel habit.  No blood in stool or melena.  Chest discomfort is not exertional and does not feel like her prior angina.  She has not tried nitroglycerin for this pain.  She takes an inhaler for her COPD and was recently changed to Trelegy.  Past Medical History:  Diagnosis Date  . COPD (chronic obstructive pulmonary disease) ([place])   . Diabetes mellitus without complication ([place])   . Diverticulosis   . Early cataracts, bilateral 11/21/2016  . Emphysema of lung ([place])   . GERD (gastroesophageal reflux disease)   . Glaucoma   . Hyperlipidemia   . Hypertension   . Internal hemorrhoids   . Sleep apnea   . Spasm of the cricopharyngeus muscle     Past Surgical History:  Procedure Laterality Date  . ABDOMINAL HYSTERECTOMY     cervical dysplasia  . BREAST BIOPSY Left   . LEFT HEART CATH AND CORONARY ANGIOGRAPHY N/A 06/11/2017   Procedure: LEFT HEART CATH AND CORONARY ANGIOGRAPHY;  Surgeon: Belva Crome, MD;  Location: [facility];  Service: Cardiovascular;  Laterality: N/A;    Outpatient  Medications Prior to Visit  Medication Sig Dispense Refill  . albuterol (PROVENTIL) (2.5 MG/3ML) 0.083% nebulizer solution Take 3 mLs (2.5 mg total) every 6 (six) hours as needed by nebulization for wheezing or shortness of breath. 75 mL 3  . aspirin EC 81 MG tablet Take 81 mg by mouth daily.    Marland Kitchen atorvastatin (LIPITOR) 40 MG tablet Take 1 tablet (40 mg total) by mouth daily. (Patient taking differently: Take 40 mg by mouth every evening. ) 90 tablet 3  . CALCIUM PO Take 1 tablet by mouth daily.    . Cholecalciferol (VITAMIN D3) 2000 units TABS Take 4,000 Units by mouth daily.    Marland Kitchen glimepiride (AMARYL) 2 MG tablet Take 1 tablet (2 mg total) by mouth daily with breakfast. 90 tablet 1  . isosorbide mononitrate (IMDUR) 30 MG 24 hr tablet Take 1 tablet (30 mg total) by mouth 2 (two) times daily. 180 tablet 1  . losartan (COZAAR) 25 MG tablet Take 1 tablet (25 mg total) by mouth daily. 90 tablet 3  . metFORMIN (GLUCOPHAGE) 1000 MG tablet Take 1 tablet (1,000 mg total) by mouth 2 (two) times daily with a meal. 180 tablet 3  . Multiple Vitamins-Minerals (MULTIVITAMIN PO) Take 1 tablet by mouth daily.    . nitroGLYCERIN (NITROSTAT) 0.4 MG SL tablet Place 1 tablet (0.4 mg total) under the tongue every 5 (five) minutes as needed for chest pain. 25  tablet 3  . omeprazole (PRILOSEC) 20 MG capsule Take 1 capsule (20 mg total) by mouth 2 (two) times daily before a meal. 180 capsule 3  . metoprolol tartrate (LOPRESSOR) 50 MG tablet Take 1 tablet (50 mg total) by mouth 2 (two) times daily. 180 tablet 1   No facility-administered medications prior to visit.     No Known Allergies  Family History  Problem Relation Age of Onset  . Cancer Mother        breast  . Arthritis Mother   . COPD Father 64       emphysema  . Alcohol abuse Father   . Diabetes Father   . Cancer Sister        breast  . Early death Brother        overdose  . Drug abuse Brother   . Alcohol abuse Paternal Aunt   . COPD Paternal  64   . Cancer Maternal Grandmother   . Diabetes Maternal Grandmother   . Alcohol abuse Maternal Grandfather   . Early death Maternal Grandfather   . Stroke Paternal Grandmother   . Heart disease Paternal Grandfather     Social History   Tobacco Use  . Smoking status: Former Smoker    Packs/day: 1.50    Years: 41.00    Pack years: 61.50    Types: Cigarettes    Start date: 1969    Last attempt to quit: 2010    Years since quitting: 10.1  . Smokeless tobacco: Never Used  Substance Use Topics  . Alcohol use: No    Frequency: Never  . Drug use: No    ROS: As per history of present illness, otherwise negative  BP 126/72   Pulse 84   Ht 5\' 1"  (1.549 m)   Wt 160 lb 2 oz (72.6 kg)   BMI 30.26 kg/m  Gen: awake, alert, NAD HEENT: anicteric, op clear CV: RRR, no mrg Pulm: CTA b/l Abd: soft, NT/ND, +BS throughout Ext: no c/c/e Neuro: nonfocal   RELEVANT LABS AND IMAGING: CBC    Component Value Date/Time   WBC 4.2 12/11/2017 1434   RBC 3.89 12/11/2017 1434   HGB 11.5 (L) 12/11/2017 1434   HCT 34.0 (L) 12/11/2017 1434   PLT 161.0 12/11/2017 1434   MCV 87.5 12/11/2017 1434   MCH 28.0 06/09/2017 1502   MCHC 33.7 12/11/2017 1434   RDW 15.1 12/11/2017 1434   LYMPHSABS 1.0 12/11/2017 1434   MONOABS 0.5 12/11/2017 1434   EOSABS 0.1 12/11/2017 1434   BASOSABS 0.0 12/11/2017 1434    CMP     Component Value Date/Time   NA 143 12/17/2017 0943   K 4.3 12/17/2017 0943   CL 103 12/17/2017 0943   CO2 24 12/17/2017 0943   GLUCOSE 121 (H) 12/17/2017 0943   GLUCOSE 129 (H) 06/09/2017 1502   BUN 12 12/17/2017 0943   CREATININE 0.55 (L) 12/17/2017 0943   CREATININE 0.56 11/21/2016 1012   CALCIUM 9.1 12/17/2017 0943   PROT 6.6 11/21/2016 1012   AST 11 11/21/2016 1012   ALT 12 11/21/2016 1012   BILITOT 0.4 11/21/2016 1012   GFRNONAA 97 12/17/2017 0943   GFRNONAA 96 11/21/2016 1012   GFRAA 111 12/17/2017 0943   GFRAA 112 11/21/2016 1012   ESOPHOGRAM / BARIUM SWALLOW  / BARIUM TABLET STUDY   TECHNIQUE: Combined double contrast and single contrast examination performed using effervescent crystals, thick barium liquid, and thin barium liquid. The patient was observed with fluoroscopy swallowing  a 13 mm barium sulphate tablet.   FLUOROSCOPY TIME:  Fluoroscopy Time:  1 minutes 24 seconds   Radiation Exposure Index (if provided by the fluoroscopic device): 17.8 mGy   Number of Acquired Spot Images: multiple fluoroscopic screen captures   COMPARISON:  None   FINDINGS: Esophageal distention: Normal without mass or stricture   Filling defects:  None   12.5 mm barium tablet: Rapidly passed from oral cavity to stomach without delay   Motility:  Normal   Mucosa:  Smooth without irregularity or ulceration   Hypopharynx/cervical esophagus: Prominent cricopharyngeus muscle. No laryngeal penetration or aspiration. No residuals.   Hiatal hernia:  Absent   GE reflux:  Not visualized during exam   Other:  N/A   IMPRESSION: No significant esophageal abnormalities.   Prominent cricopharyngeus muscle noted.     Electronically Signed   By: Lavonia Dana M.D.   On: 04/03/2017 09:40    ASSESSMENT/PLAN: 68 year old female with a past medical history of COPD, GERD, colonic diverticulosis, hypertension, hyperlipidemia sleep apnea who is seen in consult by Dr. Evette Doffing to evaluate odynophagia.  1. Odynophagia --symptoms are really odynophagia rather than dysphagia.  Her barium esophagram was unremarkable which is reassuring.  I have a strong suspicion for Candida esophagitis in the setting of inhaled corticosteroid therapy.  We discussed endoscopy versus empiric therapy.  After our discussion we will try empiric therapy with fluconazole 200 mg x 1 day followed by 100 mg x 20 days.  I have asked that she notify me towards the end of therapy.  If any odynophagia symptoms persist my recommendation is for upper endoscopy.  She voices understanding.  I did  review an upper endoscopy which she had performed in [place] in 2017.  This did not show any evidence of stricture but a 8 French Savary dilator was passed and showed mild disruption in the upper esophagus.  There was a small 2 cm hiatal hernia.  There was mild antral gastritis which was biopsied to check for H. pylori.  The remainder of the examination was normal.  2.  CRC screening --no history of polyps with last colonoscopy March 2017.  Repeat March 2027    HQ:PRFFMBW, Carol L, [place], [place] [id number]

## 2018-03-03 NOTE — Patient Instructions (Signed)
You will be due for a recall colonoscopy in 03/2025. We will send you a reminder in the mail when it gets closer to that time.  We have sent the following medications to your pharmacy for you to pick up at your convenience: Fluconazole 200 mg x 1 day, then 100 mg daily x 20 days thereafter.  Please call our office if you are no better after taking fluconazole.  If you are age 69 or older, your body mass index should be between 23-30. Your Body mass index is 30.26 kg/m. If this is out of the aforementioned range listed, please consider follow up with your Primary Care Provider.  If you are age 66 or younger, your body mass index should be between 19-25. Your Body mass index is 30.26 kg/m. If this is out of the aformentioned range listed, please consider follow up with your Primary Care Provider.

## 2018-03-09 DIAGNOSIS — G4733 Obstructive sleep apnea (adult) (pediatric): Secondary | ICD-10-CM | POA: Diagnosis not present

## 2018-03-15 ENCOUNTER — Encounter: Payer: Self-pay | Admitting: Pulmonary Disease

## 2018-03-15 ENCOUNTER — Ambulatory Visit: Payer: Medicare HMO | Admitting: Pulmonary Disease

## 2018-03-15 VITALS — BP 126/68 | HR 84 | Ht 61.0 in | Wt 156.6 lb

## 2018-03-15 DIAGNOSIS — G4733 Obstructive sleep apnea (adult) (pediatric): Secondary | ICD-10-CM | POA: Diagnosis not present

## 2018-03-15 DIAGNOSIS — J449 Chronic obstructive pulmonary disease, unspecified: Secondary | ICD-10-CM

## 2018-03-15 MED ORDER — UMECLIDINIUM-VILANTEROL 62.5-25 MCG/INH IN AEPB: 1.0000 | INHALATION_SPRAY | Freq: Every day | RESPIRATORY_TRACT | 5 refills | Status: AC

## 2018-03-15 NOTE — Progress Notes (Signed)
Erica Braun    [id number]    1950/08/28  Primary Care Physician:Vincent, Berlin Hun, MD  Referring Physician: Eustaquio Maize, MD [place], [place] [address]  Chief complaint: Follow up for COPD, sleep apnea  HPI: 68 year old with history of COPD, coronary artery disease, diabetes, sleep apnea Cardiac catheterization in June 2019 showing coronary artery disease.  She is on medical management for this but continues to have significant dyspnea on exertion.  She is being considered for coronary intervention and has been referred to pulmonary for evaluation of COPD before proceeding with that.  Diagnosed with COPD around 2005 in [place]. Maintained on Breo CT scans in [place] are significant for granulomas and no interstitial lung disease. She also has been diagnosed with sleep apnea in [place] and was on CPAP which she had to stop while moving here. Complains of dyspnea on exertion, mild dyspnea at rest, chronic cough with white mucus.  Pets: No pets Occupation: Retired Advertising account planner Exposures: No known exposures, no mold, hot tub, Jacuzzi Smoking history: 45-pack-year smoker.  Quit in 2010 Travel history: Grew up in the [place] in [place] for over 30 years.  Moved to [place] in 2018. Relevant family history: Father had emphysema, he was a smoker.  Interim history: Breo changed to Trelegy at last visit.  She feels that this works better for her. Symptoms of dyspnea stable  Started on CPAP sleep study that shows moderate sleep apnea.  She is tolerating it well with good response. She feels that her nose mask is not fitting her well and is requesting a mask change to full mask.  Evaluated by Dr. Hilarie Fredrickson, GI for odynophagia.  She has suspected candidal esophagitis and got her treatment with Diflucan.  Outpatient Encounter Medications as of 03/15/2018  Medication Sig  . albuterol (PROVENTIL) (2.5 MG/3ML) 0.083% nebulizer solution Take 3 mLs (2.5 mg total)  every 6 (six) hours as needed by nebulization for wheezing or shortness of breath.  Marland Kitchen aspirin EC 81 MG tablet Take 81 mg by mouth daily.  Marland Kitchen atorvastatin (LIPITOR) 40 MG tablet Take 1 tablet (40 mg total) by mouth daily. (Patient taking differently: Take 40 mg by mouth every evening. )  . CALCIUM PO Take 1 tablet by mouth daily.  . Cholecalciferol (VITAMIN D3) 2000 units TABS Take 4,000 Units by mouth daily.  . fluconazole (DIFLUCAN) 100 MG tablet Take 2 tablet by mouth on day 1, then take 1 tablet by mouth daily on days 2- 21.  . glimepiride (AMARYL) 2 MG tablet Take 1 tablet (2 mg total) by mouth daily with breakfast.  . isosorbide mononitrate (IMDUR) 30 MG 24 hr tablet Take 1 tablet (30 mg total) by mouth 2 (two) times daily.  Marland Kitchen losartan (COZAAR) 25 MG tablet Take 1 tablet (25 mg total) by mouth daily.  . metFORMIN (GLUCOPHAGE) 1000 MG tablet Take 1 tablet (1,000 mg total) by mouth 2 (two) times daily with a meal.  . Multiple Vitamins-Minerals (MULTIVITAMIN PO) Take 1 tablet by mouth daily.  . nitroGLYCERIN (NITROSTAT) 0.4 MG SL tablet Place 1 tablet (0.4 mg total) under the tongue every 5 (five) minutes as needed for chest pain.  Marland Kitchen omeprazole (PRILOSEC) 20 MG capsule Take 1 capsule (20 mg total) by mouth 2 (two) times daily before a meal.  . metoprolol tartrate (LOPRESSOR) 50 MG tablet Take 1 tablet (50 mg total) by mouth 2 (two) times daily.   No facility-administered encounter medications  on file as of 03/15/2018.    Physical Exam: Blood pressure 126/68, pulse 84, height 5\' 1"  (1.549 m), weight 156 lb 9.6 oz (71 kg), SpO2 91 %. Gen:      No acute distress HEENT:  EOMI, sclera anicteric Neck:     No masses; no thyromegaly Lungs:    Clear to auscultation bilaterally; normal respiratory effort CV:         Regular rate and rhythm; no murmurs Abd:      + bowel sounds; soft, non-tender; no palpable masses, no distension Ext:    No edema; adequate peripheral perfusion Skin:      Warm and dry;  no rash Neuro: alert and oriented x 3 Psych: normal mood and affect  Data Reviewed: Imaging: CT report 10/11/2015- nonspecific subcentimeter mediastinal and hilar lymph nodes, 4 mm calcified granuloma in the right upper lobe, tree-in-bud nodularity in the right middle lobe, coronary atherosclerosis  CT report 03/14/2016- interval resolution of right middle lobe tree-in-bud opacity, stable 6 mm right upper lobe and 2 mm right middle lobe granuloma.  Screening CT chest 11/23/2017- mild emphysema with bronchial wall thickening.  Right upper lobe calcified granuloma.  Granulomatous splenic calcifications.  Three-vessel coronary artery atherosclerosis  PFTs 03/02/2015 [from [place]] FVC 2.46 [88%), FEV1 1.23 [57%], F/F 50 Moderate obstruction  12/11/2017 FVC 1.64 [60%], FEV1 0.98 [47%), F/F 60, TLC 108%, DLCO 59% Severe obstruction with moderate diffusion defect.  Labs: CBC 06/09/17-WBC 5, eos 2%, absolute eosinophil count 100 CBC 12/11/17-WBC 4.2, eos 2.2%, absolute eosinophil count 92.4 IgE 12/11/2017-91 Alpha-1 antitrypsin 12/13/2017- 179 PI MM  Sleep PSG 11/15/2017-mild sleep apnea, AHI 11.1, low O2 sats of 74%. CPAP titration 12/20/2017- Titrated to CPAP 11 cm water.  Assessment:  COPD PFTs reviewed with severe obstruction, air trapping. Currently on Trelegy inhaler.  As she has recent incidence of Candida esophagitis we will switch this to Anoro which is the LABA/LAMA without the inhaled corticosteroid.  I do not believe she will require the inhaled corticosteroid as peripheral eosinophils are low.  Ex-smoker Continue annual low-dose screening CTs.  Sleep apnea Currently on CPAP Download reviewed with good compliance and response We will order a full facemask due to discomfort with the nasal mask.  Health maintenance 11/01/17-influenza 12/11/17-Pneumovax.  Plan/Recommendations: - Stop Trelegy, start Anoro - Continue CPAP, mask fitting and full facemask.  Marshell Garfinkel  MD [facility] 03/15/2018, 2:33 PM  CC: Eustaquio Maize, MD

## 2018-03-15 NOTE — Patient Instructions (Signed)
We will stop the Trelegy and start you on a different inhaler called Anoro which does not have the inhaled steroid in it Continue using the CPAP.  We will put in an order to the DME for mask fitting and mask change  Follow-up in 6 months.

## 2018-03-23 ENCOUNTER — Encounter: Payer: Self-pay | Admitting: Family Medicine

## 2018-03-23 ENCOUNTER — Ambulatory Visit (INDEPENDENT_AMBULATORY_CARE_PROVIDER_SITE_OTHER): Payer: Medicare HMO | Admitting: Family Medicine

## 2018-03-23 ENCOUNTER — Other Ambulatory Visit: Payer: Self-pay

## 2018-03-23 VITALS — BP 126/72 | HR 80 | Temp 97.2°F | Ht 61.0 in | Wt 159.0 lb

## 2018-03-23 DIAGNOSIS — E785 Hyperlipidemia, unspecified: Secondary | ICD-10-CM

## 2018-03-23 DIAGNOSIS — E1122 Type 2 diabetes mellitus with diabetic chronic kidney disease: Secondary | ICD-10-CM

## 2018-03-23 DIAGNOSIS — I129 Hypertensive chronic kidney disease with stage 1 through stage 4 chronic kidney disease, or unspecified chronic kidney disease: Secondary | ICD-10-CM

## 2018-03-23 DIAGNOSIS — K2100 Gastro-esophageal reflux disease with esophagitis, without bleeding: Secondary | ICD-10-CM

## 2018-03-23 DIAGNOSIS — E1169 Type 2 diabetes mellitus with other specified complication: Secondary | ICD-10-CM

## 2018-03-23 DIAGNOSIS — Z1239 Encounter for other screening for malignant neoplasm of breast: Secondary | ICD-10-CM | POA: Diagnosis not present

## 2018-03-23 DIAGNOSIS — E119 Type 2 diabetes mellitus without complications: Secondary | ICD-10-CM | POA: Diagnosis not present

## 2018-03-23 LAB — BAYER DCA HB A1C WAIVED: HB A1C (BAYER DCA - WAIVED): 6.7 % (ref <7.0)

## 2018-03-23 MED ORDER — OMEPRAZOLE 20 MG PO CPDR: 20.0000 mg | DELAYED_RELEASE_CAPSULE | Freq: Two times a day (BID) | ORAL | 3 refills | Status: DC

## 2018-03-23 MED ORDER — LOSARTAN POTASSIUM 25 MG PO TABS: 25.0000 mg | ORAL_TABLET | Freq: Every day | ORAL | 3 refills | Status: DC

## 2018-03-23 MED ORDER — GLIMEPIRIDE 2 MG PO TABS: 2.0000 mg | ORAL_TABLET | Freq: Every day | ORAL | 1 refills | Status: DC

## 2018-03-23 MED ORDER — METFORMIN HCL 1000 MG PO TABS: 1000.0000 mg | ORAL_TABLET | Freq: Two times a day (BID) | ORAL | 3 refills | Status: DC

## 2018-03-23 NOTE — Patient Instructions (Signed)
Diabetes Mellitus and Nutrition, Adult  When you have diabetes (diabetes mellitus), it is very important to have healthy eating habits because your blood sugar (glucose) levels are greatly affected by what you eat and drink. Eating healthy foods in the appropriate amounts, at about the same times every day, can help you:  · Control your blood glucose.  · Lower your risk of heart disease.  · Improve your blood pressure.  · Reach or maintain a healthy weight.  Every person with diabetes is different, and each person has different needs for a meal plan. Your health care provider may recommend that you work with a diet and nutrition specialist (dietitian) to make a meal plan that is best for you. Your meal plan may vary depending on factors such as:  · The calories you need.  · The medicines you take.  · Your weight.  · Your blood glucose, blood pressure, and cholesterol levels.  · Your activity level.  · Other health conditions you have, such as heart or kidney disease.  How do carbohydrates affect me?  Carbohydrates, also called carbs, affect your blood glucose level more than any other type of food. Eating carbs naturally raises the amount of glucose in your blood. Carb counting is a method for keeping track of how many carbs you eat. Counting carbs is important to keep your blood glucose at a healthy level, especially if you use insulin or take certain oral diabetes medicines.  It is important to know how many carbs you can safely have in each meal. This is different for every person. Your dietitian can help you calculate how many carbs you should have at each meal and for each snack.  Foods that contain carbs include:  · Bread, cereal, rice, pasta, and crackers.  · Potatoes and corn.  · Peas, beans, and lentils.  · Milk and yogurt.  · Fruit and juice.  · Desserts, such as cakes, cookies, ice cream, and candy.  How does alcohol affect me?  Alcohol can cause a sudden decrease in blood glucose (hypoglycemia),  especially if you use insulin or take certain oral diabetes medicines. Hypoglycemia can be a life-threatening condition. Symptoms of hypoglycemia (sleepiness, dizziness, and confusion) are similar to symptoms of having too much alcohol.  If your health care provider says that alcohol is safe for you, follow these guidelines:  · Limit alcohol intake to no more than 1 drink per day for nonpregnant women and 2 drinks per day for men. One drink equals 12 oz of beer, 5 oz of wine, or 1½ oz of hard liquor.  · Do not drink on an empty stomach.  · Keep yourself hydrated with water, diet soda, or unsweetened iced tea.  · Keep in mind that regular soda, juice, and other mixers may contain a lot of sugar and must be counted as carbs.  What are tips for following this plan?    Reading food labels  · Start by checking the serving size on the "Nutrition Facts" label of packaged foods and drinks. The amount of calories, carbs, fats, and other nutrients listed on the label is based on one serving of the item. Many items contain more than one serving per package.  · Check the total grams (g) of carbs in one serving. You can calculate the number of servings of carbs in one serving by dividing the total carbs by 15. For example, if a food has 30 g of total carbs, it would be equal to 2   servings of carbs.  · Check the number of grams (g) of saturated and trans fats in one serving. Choose foods that have low or no amount of these fats.  · Check the number of milligrams (mg) of salt (sodium) in one serving. Most people should limit total sodium intake to less than 2,300 mg per day.  · Always check the nutrition information of foods labeled as "low-fat" or "nonfat". These foods may be higher in added sugar or refined carbs and should be avoided.  · Talk to your dietitian to identify your daily goals for nutrients listed on the label.  Shopping  · Avoid buying canned, premade, or processed foods. These foods tend to be high in fat, sodium,  and added sugar.  · Shop around the outside edge of the grocery store. This includes fresh fruits and vegetables, bulk grains, fresh meats, and fresh dairy.  Cooking  · Use low-heat cooking methods, such as baking, instead of high-heat cooking methods like deep frying.  · Cook using healthy oils, such as olive, canola, or sunflower oil.  · Avoid cooking with butter, cream, or high-fat meats.  Meal planning  · Eat meals and snacks regularly, preferably at the same times every day. Avoid going long periods of time without eating.  · Eat foods high in fiber, such as fresh fruits, vegetables, beans, and whole grains. Talk to your dietitian about how many servings of carbs you can eat at each meal.  · Eat 4-6 ounces (oz) of lean protein each day, such as lean meat, chicken, fish, eggs, or tofu. One oz of lean protein is equal to:  ? 1 oz of meat, chicken, or fish.  ? 1 egg.  ? ¼ cup of tofu.  · Eat some foods each day that contain healthy fats, such as avocado, nuts, seeds, and fish.  Lifestyle  · Check your blood glucose regularly.  · Exercise regularly as told by your health care provider. This may include:  ? 150 minutes of moderate-intensity or vigorous-intensity exercise each week. This could be brisk walking, biking, or water aerobics.  ? Stretching and doing strength exercises, such as yoga or weightlifting, at least 2 times a week.  · Take medicines as told by your health care provider.  · Do not use any products that contain nicotine or tobacco, such as cigarettes and e-cigarettes. If you need help quitting, ask your health care provider.  · Work with a counselor or diabetes educator to identify strategies to manage stress and any emotional and social challenges.  Questions to ask a health care provider  · Do I need to meet with a diabetes educator?  · Do I need to meet with a dietitian?  · What number can I call if I have questions?  · When are the best times to check my blood glucose?  Where to find more  information:  · American Diabetes Association: diabetes.org  · Academy of Nutrition and Dietetics: www.eatright.org  · National Institute of Diabetes and Digestive and Kidney Diseases (NIH): www.niddk.nih.gov  Summary  · A healthy meal plan will help you control your blood glucose and maintain a healthy lifestyle.  · Working with a diet and nutrition specialist (dietitian) can help you make a meal plan that is best for you.  · Keep in mind that carbohydrates (carbs) and alcohol have immediate effects on your blood glucose levels. It is important to count carbs and to use alcohol carefully.  This information is not intended to   replace advice given to you by your health care provider. Make sure you discuss any questions you have with your health care provider.  Document Released: 09/19/2004 Document Revised: 07/23/2016 Document Reviewed: 01/28/2016  Elsevier Interactive Patient Education © 2019 Elsevier Inc.

## 2018-03-23 NOTE — Progress Notes (Signed)
Subjective:  Patient ID: Erica Braun, female    DOB: [birth year], 67 y.o.   MRN: [id number]  Chief Complaint:  Medical Management of Chronic Issues   HPI: Erica Braun is a 68 y.o. female presenting on 03/23/2018 for Medical Management of Chronic Issues   1. Hypertension associated with chronic kidney disease due to type 2 diabetes mellitus ([place])  Complaint with meds - Yes Checking BP at home ranging 120/60 Exercising Regularly - No Watching Salt intake - Yes Pertinent ROS:  Headache - No Chest pain - No Dyspnea - No Palpitations - No LE edema - No They report good compliance with medications and can restate their regimen by memory. No medication side effects.  BP Readings from Last 3 Encounters:  03/23/18 126/72  03/15/18 126/68  03/03/18 126/72     2. Type 2 diabetes mellitus without complication, without long-term current use of insulin (HCC)  Diabetes mellitus 2 Compliant with meds - Yes Checking CBGs? Yes  Fasting avg - 118  Postprandial average - 140 Exercising regularly? - No Watching carbohydrate intake? - Yes Neuropathy ? - No Hypoglycemic events - No Pertinent ROS:  Polyuria - No Polydipsia - No Vision problems - No   3. Hyperlipidemia associated with type 2 diabetes mellitus ([place])  Does try to watch her diet. Does not exercise on a regular basis.    4. GERD with esophagitis  Recently evaluated and treated by GI. Has been on Diflucan for possible candida infection. Pt states this has not improved her symptoms. States she is to follow up with GI for an endoscopy.    5. Screening for malignant neoplasm of breast  Pt is due for a mammogram and would like an order placed today. Denies breast changes.      Relevant past medical, surgical, family, and social history reviewed and updated as indicated.  Allergies and medications reviewed and updated.   Past Medical History:  Diagnosis Date  . COPD (chronic obstructive pulmonary disease)  ([place])   . Diabetes mellitus without complication ([place])   . Diverticulosis   . Early cataracts, bilateral 11/21/2016  . Emphysema of lung ([place])   . GERD (gastroesophageal reflux disease)   . Glaucoma   . Hyperlipidemia   . Hypertension   . Internal hemorrhoids   . Sleep apnea   . Spasm of the cricopharyngeus muscle     Past Surgical History:  Procedure Laterality Date  . ABDOMINAL HYSTERECTOMY     cervical dysplasia  . BREAST BIOPSY Left   . LEFT HEART CATH AND CORONARY ANGIOGRAPHY N/A 06/11/2017   Procedure: LEFT HEART CATH AND CORONARY ANGIOGRAPHY;  Surgeon: Belva Crome, MD;  Location: [facility];  Service: Cardiovascular;  Laterality: N/A;    Social History   Socioeconomic History  . Marital status: Married    Spouse name: Erica Braun  . Number of children: 3  . Years of education: 66  . Highest education level: Not on file  Occupational History  . Occupation: retired    Comment: Advertising account planner  Social Needs  . Financial resource strain: Not hard at all  . Food insecurity:    Worry: Never true    Inability: Never true  . Transportation needs:    Medical: No    Non-medical: No  Tobacco Use  . Smoking status: Former Smoker    Packs/day: 1.50    Years: 41.00    Pack years: 61.50    Types: Cigarettes    Start date:  1969    Last attempt to quit: 2010    Years since quitting: 10.2  . Smokeless tobacco: Never Used  Substance and Sexual Activity  . Alcohol use: No    Frequency: Never  . Drug use: No  . Sexual activity: Yes    Birth control/protection: Post-menopausal  Lifestyle  . Physical activity:    Days per week: 4 days    Minutes per session: 30 min  . Stress: Only a little  Relationships  . Social connections:    Talks on phone: Not on file    Gets together: Not on file    Attends religious service: Not on file    Active member of club or organization: Not on file    Attends meetings of clubs or organizations: Not on file    Relationship status:  Not on file  . Intimate partner violence:    Fear of current or ex partner: No    Emotionally abused: No    Physically abused: No    Forced sexual activity: No  Other Topics Concern  . Not on file  Social History Narrative   Recent move from [place] to walk   Married to [employer] dog at home    Outpatient Encounter Medications as of 03/23/2018  Medication Sig  . albuterol (PROVENTIL) (2.5 MG/3ML) 0.083% nebulizer solution Take 3 mLs (2.5 mg total) every 6 (six) hours as needed by nebulization for wheezing or shortness of breath.  Jearl Klinefelter ELLIPTA 62.5-25 MCG/INH AEPB INHALE 1 PUFF BY MOUTH ONCE DAILY FOR 1 DAY  . aspirin EC 81 MG tablet Take 81 mg by mouth daily.  Marland Kitchen atorvastatin (LIPITOR) 40 MG tablet Take 1 tablet (40 mg total) by mouth daily. (Patient taking differently: Take 40 mg by mouth every evening. )  . CALCIUM PO Take 1 tablet by mouth daily.  . Cholecalciferol (VITAMIN D3) 2000 units TABS Take 4,000 Units by mouth daily.  . fluconazole (DIFLUCAN) 100 MG tablet Take 2 tablet by mouth on day 1, then take 1 tablet by mouth daily on days 2- 21.  . glimepiride (AMARYL) 2 MG tablet Take 1 tablet (2 mg total) by mouth daily with breakfast.  . isosorbide mononitrate (IMDUR) 30 MG 24 hr tablet Take 1 tablet (30 mg total) by mouth 2 (two) times daily.  Marland Kitchen losartan (COZAAR) 25 MG tablet Take 1 tablet (25 mg total) by mouth daily.  . metFORMIN (GLUCOPHAGE) 1000 MG tablet Take 1 tablet (1,000 mg total) by mouth 2 (two) times daily with a meal.  . Multiple Vitamins-Minerals (MULTIVITAMIN PO) Take 1 tablet by mouth daily.  . nitroGLYCERIN (NITROSTAT) 0.4 MG SL tablet Place 1 tablet (0.4 mg total) under the tongue every 5 (five) minutes as needed for chest pain.  Marland Kitchen omeprazole (PRILOSEC) 20 MG capsule Take 1 capsule (20 mg total) by mouth 2 (two) times daily before a meal.  . [DISCONTINUED] glimepiride (AMARYL) 2 MG tablet Take 1 tablet (2 mg total) by mouth daily with breakfast.  .  [DISCONTINUED] losartan (COZAAR) 25 MG tablet Take 1 tablet (25 mg total) by mouth daily.  . [DISCONTINUED] metFORMIN (GLUCOPHAGE) 1000 MG tablet Take 1 tablet (1,000 mg total) by mouth 2 (two) times daily with a meal.  . [DISCONTINUED] omeprazole (PRILOSEC) 20 MG capsule Take 1 capsule (20 mg total) by mouth 2 (two) times daily before a meal.  . metoprolol tartrate (LOPRESSOR) 50 MG tablet Take 1 tablet (50 mg total) by  mouth 2 (two) times daily.   No facility-administered encounter medications on file as of 03/23/2018.     No Known Allergies  Review of Systems  Constitutional: Negative for activity change, appetite change, chills, fatigue, fever and unexpected weight change.  Respiratory: Negative for cough, chest tightness and shortness of breath.   Cardiovascular: Negative for chest pain, palpitations and leg swelling.  Gastrointestinal: Negative for abdominal pain, anal bleeding, blood in stool, constipation, diarrhea, nausea and vomiting.  Endocrine: Negative for polydipsia, polyphagia and polyuria.  Genitourinary: Negative for decreased urine volume and difficulty urinating.  Musculoskeletal: Negative for arthralgias and myalgias.  Neurological: Negative for dizziness, tremors, seizures, syncope, facial asymmetry, speech difficulty, weakness, light-headedness, numbness and headaches.  Psychiatric/Behavioral: Negative for confusion, decreased concentration and sleep disturbance.  All other systems reviewed and are negative.       Objective:  BP 126/72   Pulse 80   Temp (!) 97.2 F (36.2 C) (Oral)   Ht 5' 1"  (1.549 m)   Wt 159 lb (72.1 kg)   BMI 30.04 kg/m    Wt Readings from Last 3 Encounters:  03/23/18 159 lb (72.1 kg)  03/15/18 156 lb 9.6 oz (71 kg)  03/03/18 160 lb 2 oz (72.6 kg)    Physical Exam Vitals signs and nursing note reviewed.  Constitutional:      General: She is not in acute distress.    Appearance: Normal appearance. She is well-developed and  well-groomed. She is not ill-appearing, toxic-appearing or diaphoretic.  HENT:     Head: Normocephalic and atraumatic.     Right Ear: Hearing, tympanic membrane, ear canal and external ear normal.     Left Ear: Hearing, tympanic membrane, ear canal and external ear normal.     Nose: Nose normal.     Mouth/Throat:     Lips: Pink.     Mouth: Mucous membranes are moist.     Pharynx: Oropharynx is clear. Uvula midline. No oropharyngeal exudate or posterior oropharyngeal erythema.  Eyes:     General: Lids are normal.     Extraocular Movements: Extraocular movements intact.     Conjunctiva/sclera: Conjunctivae normal.     Pupils: Pupils are equal, round, and reactive to light.  Neck:     Musculoskeletal: Normal range of motion and neck supple.     Thyroid: No thyromegaly.     Vascular: No carotid bruit or JVD.     Trachea: Trachea and phonation normal. No tracheal deviation.  Cardiovascular:     Rate and Rhythm: Normal rate and regular rhythm.     Chest Wall: PMI is not displaced.     Pulses: Normal pulses.     Heart sounds: Normal heart sounds. No murmur. No friction rub. No gallop.   Pulmonary:     Effort: Pulmonary effort is normal. No respiratory distress.     Breath sounds: Normal breath sounds. No wheezing.  Abdominal:     General: Bowel sounds are normal.     Palpations: Abdomen is soft.     Tenderness: There is no abdominal tenderness.  Musculoskeletal: Normal range of motion.  Skin:    General: Skin is warm and dry.     Capillary Refill: Capillary refill takes less than 2 seconds.  Neurological:     General: No focal deficit present.     Mental Status: She is alert and oriented to person, place, and time.     Cranial Nerves: No cranial nerve deficit.     Sensory: No sensory deficit.  Motor: No weakness.     Coordination: Coordination normal.     Gait: Gait normal.     Deep Tendon Reflexes: Reflexes normal.  Psychiatric:        Mood and Affect: Mood normal.         Behavior: Behavior normal. Behavior is cooperative.        Thought Content: Thought content normal.        Judgment: Judgment normal.     Results for orders placed or performed in visit on 12/17/17  [facility]  Result Value Ref Range   Glucose 121 (H) 65 - 99 mg/dL   BUN 12 8 - 27 mg/dL   Creatinine, Ser 0.55 (L) 0.57 - 1.00 mg/dL   GFR calc non Af Amer 97 >59 mL/min/1.73   GFR calc Af Amer 111 >59 mL/min/1.73   BUN/Creatinine Ratio 22 12 - 28   Sodium 143 134 - 144 mmol/L   Potassium 4.3 3.5 - 5.2 mmol/L   Chloride 103 96 - 106 mmol/L   CO2 24 20 - 29 mmol/L   Calcium 9.1 8.7 - 10.3 mg/dL  Bayer DCA Hb A1c Waived  Result Value Ref Range   HB A1C (BAYER DCA - WAIVED) 5.7 <7.0 %       Pertinent labs & imaging results that were available during my care of the patient were reviewed by me and considered in my medical decision making.  Assessment & Plan:  Dominigue was seen today for medical management of chronic issues.  Diagnoses and all orders for this visit:  Hypertension associated with chronic kidney disease due to type 2 diabetes mellitus ([place]) Stable, continue below.  -     CBC with Differential/Platelet -     CMP14+EGFR -     losartan (COZAAR) 25 MG tablet; Take 1 tablet (25 mg total) by mouth daily.  Type 2 diabetes mellitus without complication, without long-term current use of insulin (HCC) A1C 6.7 today. Diet and exercise encouraged. Continue medications as prescribed. Report any persistent highs or lows.  -     CBC with Differential/Platelet -     CMP14+EGFR -     Bayer DCA Hb A1c Waived -     Microalbumin / creatinine urine ratio -     glimepiride (AMARYL) 2 MG tablet; Take 1 tablet (2 mg total) by mouth daily with breakfast. -     metFORMIN (GLUCOPHAGE) 1000 MG tablet; Take 1 tablet (1,000 mg total) by mouth 2 (two) times daily with a meal.  Hyperlipidemia associated with type 2 diabetes mellitus ([place]) Diet and exercise encouraged. Labs pending.  -     CBC  with Differential/Platelet -     CMP14+EGFR -     Lipid panel  GERD with esophagitis Will follow up with GI for endoscopy. Continue medications as prescribed.  -     omeprazole (PRILOSEC) 20 MG capsule; Take 1 capsule (20 mg total) by mouth 2 (two) times daily before a meal.  Screening for malignant neoplasm of breast -     MM 3D SCREEN BREAST BILATERAL; Future     Continue all other maintenance medications.  Follow up plan: Return in about 3 months (around 06/23/2018), or if symptoms worsen or fail to improve, for DM, HTN.  Educational handout given for DM and nutrition  The above assessment and management plan was discussed with the patient. The patient verbalized understanding of and has agreed to the management plan. Patient is aware to call the clinic if symptoms persist  or worsen. Patient is aware when to return to the clinic for a follow-up visit. Patient educated on when it is appropriate to go to the emergency department.   Monia Pouch, [name]-C [facility] [phone]

## 2018-03-24 ENCOUNTER — Other Ambulatory Visit: Payer: Self-pay

## 2018-03-24 ENCOUNTER — Telehealth: Payer: Self-pay | Admitting: Family Medicine

## 2018-03-24 DIAGNOSIS — R7989 Other specified abnormal findings of blood chemistry: Secondary | ICD-10-CM

## 2018-03-24 DIAGNOSIS — R7309 Other abnormal glucose: Secondary | ICD-10-CM

## 2018-03-24 LAB — CBC WITH DIFFERENTIAL/PLATELET
BASOS ABS: 0 10*3/uL (ref 0.0–0.2)
Basos: 0 %
EOS (ABSOLUTE): 0.1 10*3/uL (ref 0.0–0.4)
Eos: 2 %
HEMOGLOBIN: 10.3 g/dL — AB (ref 11.1–15.9)
Hematocrit: 33.8 % — ABNORMAL LOW (ref 34.0–46.6)
Immature Grans (Abs): 0 10*3/uL (ref 0.0–0.1)
Immature Granulocytes: 0 %
Lymphocytes Absolute: 0.5 10*3/uL — ABNORMAL LOW (ref 0.7–3.1)
Lymphs: 18 %
MCH: 27.7 pg (ref 26.6–33.0)
MCHC: 30.5 g/dL — ABNORMAL LOW (ref 31.5–35.7)
MCV: 91 fL (ref 79–97)
Monocytes Absolute: 0.3 10*3/uL (ref 0.1–0.9)
Monocytes: 12 %
Neutrophils Absolute: 1.8 10*3/uL (ref 1.4–7.0)
Neutrophils: 68 %
Platelets: 127 10*3/uL — ABNORMAL LOW (ref 150–450)
RBC: 3.72 x10E6/uL — ABNORMAL LOW (ref 3.77–5.28)
RDW: 14.7 % (ref 11.7–15.4)
WBC: 2.6 10*3/uL — ABNORMAL LOW (ref 3.4–10.8)

## 2018-03-24 LAB — CMP14+EGFR
ALBUMIN: 4.2 g/dL (ref 3.8–4.8)
ALT: 13 IU/L (ref 0–32)
AST: 10 IU/L (ref 0–40)
Albumin/Globulin Ratio: 1.8 (ref 1.2–2.2)
Alkaline Phosphatase: 106 IU/L (ref 39–117)
BUN/Creatinine Ratio: 20 (ref 12–28)
BUN: 14 mg/dL (ref 8–27)
Bilirubin Total: 0.3 mg/dL (ref 0.0–1.2)
CO2: 21 mmol/L (ref 20–29)
Calcium: 9.7 mg/dL (ref 8.7–10.3)
Chloride: 96 mmol/L (ref 96–106)
Creatinine, Ser: 0.71 mg/dL (ref 0.57–1.00)
GFR calc Af Amer: 101 mL/min/{1.73_m2} (ref >59)
GFR calc non Af Amer: 88 mL/min/{1.73_m2} (ref >59)
GLUCOSE: 414 mg/dL — AB (ref 65–99)
Globulin, Total: 2.4 g/dL (ref 1.5–4.5)
Potassium: 4.1 mmol/L (ref 3.5–5.2)
Sodium: 136 mmol/L (ref 134–144)
Total Protein: 6.6 g/dL (ref 6.0–8.5)

## 2018-03-24 LAB — MICROALBUMIN / CREATININE URINE RATIO
Creatinine, Urine: 26.9 mg/dL
Microalb/Creat Ratio: 12 mg/g creat (ref 0–29)
Microalbumin, Urine: 3.2 ug/mL

## 2018-03-24 LAB — LIPID PANEL
Chol/HDL Ratio: 3.9 ratio (ref 0.0–4.4)
Cholesterol, Total: 159 mg/dL (ref 100–199)
HDL: 41 mg/dL (ref >39)
LDL Calculated: 77 mg/dL (ref 0–99)
Triglycerides: 206 mg/dL — ABNORMAL HIGH (ref 0–149)
VLDL CHOLESTEROL CAL: 41 mg/dL — AB (ref 5–40)

## 2018-03-24 NOTE — Telephone Encounter (Signed)
Left message for patient to call .  We need to know what brand you are using, name of meter?

## 2018-03-26 MED ORDER — ONETOUCH ULTRASOFT LANCETS MISC: 11 refills | Status: DC

## 2018-03-26 MED ORDER — GLUCOSE BLOOD VI STRP: ORAL_STRIP | 11 refills | Status: DC

## 2018-03-26 NOTE — Telephone Encounter (Signed)
One touch strips and lancets sent in

## 2018-03-29 ENCOUNTER — Telehealth: Payer: Self-pay | Admitting: Internal Medicine

## 2018-03-29 NOTE — Telephone Encounter (Signed)
Dr Pyrtle, please advise.... 

## 2018-03-29 NOTE — Telephone Encounter (Signed)
Left voicemail for patient to call back. 

## 2018-03-29 NOTE — Telephone Encounter (Signed)
I received message that empiric fluconazole for odynophagia has not resulted in benefit Going straight to endoscopy right now is discouraged in the setting of COVID-19 We will schedule virtual visit if patient agreeable

## 2018-03-29 NOTE — Telephone Encounter (Signed)
Pt called and stated that she was advised that if the medication did not work to call back and let him know. She is calling in to state it did not work

## 2018-03-30 ENCOUNTER — Other Ambulatory Visit: Payer: Self-pay | Admitting: *Deleted

## 2018-03-30 DIAGNOSIS — R69 Illness, unspecified: Secondary | ICD-10-CM | POA: Diagnosis not present

## 2018-03-30 MED ORDER — GLUCOSE BLOOD VI STRP: ORAL_STRIP | 3 refills | Status: DC

## 2018-03-30 MED ORDER — ONETOUCH ULTRASOFT LANCETS MISC: 3 refills | Status: DC

## 2018-03-30 NOTE — Telephone Encounter (Signed)
I have spoken to patient to advise of Dr Vena Rua recommendations. She has requested telephone visit with Dr Hilarie Fredrickson on 04/01/18 around 130. Patient has been scheduled.

## 2018-03-31 ENCOUNTER — Telehealth: Payer: Self-pay | Admitting: Family Medicine

## 2018-03-31 MED ORDER — BLOOD GLUCOSE MONITOR SOFTWARE DEVI: 1 refills | Status: DC

## 2018-03-31 NOTE — Telephone Encounter (Signed)
Pt aware.

## 2018-03-31 NOTE — Telephone Encounter (Signed)
Sent [facility]

## 2018-04-01 ENCOUNTER — Telehealth (INDEPENDENT_AMBULATORY_CARE_PROVIDER_SITE_OTHER): Payer: Medicare HMO | Admitting: Internal Medicine

## 2018-04-01 ENCOUNTER — Other Ambulatory Visit: Payer: Self-pay

## 2018-04-01 DIAGNOSIS — R1310 Dysphagia, unspecified: Secondary | ICD-10-CM | POA: Diagnosis not present

## 2018-04-01 NOTE — Progress Notes (Signed)
This service was provided via telemedicine.   The patient was located at  home The provider was located in office The patient did consent to this telephone visit and is aware of possible charges through their insurance for this visit.   The other persons participating in this telemedicine service were me, patient, Quintin Alto, CMA   Time spent on call: 11 min  HPI: Erica Braun is a 68 year old female with a history of COPD, GERD, diverticulosis, hypertension, hyperlipidemia, sleep apnea recently seen to evaluate odynophagia and treated with empiric fluconazole therapy x21 days.  Follows up by phone today in the setting of COVID-19 pandemic.  Saw PCP last week, WBC, Hgb and Plts were low compared to baseline.  Glucose was over 400.  Plan is for recheck next Monday.  Pain with swallowing did not really improve.  Sometimes when she eats she knows it is going to hurt so food will feel like it gets stuck.  She worries about eating.  Tightness in chest with swallowing, solids and liquids.  Less with sipping water, but yes if iced tea, warm coffee.    Has been off fluconazole for at least 1 week, just a bit longer.  Mild nausea, no vomiting.  No diarrhea or fevers.    Past Medical History:  Diagnosis Date  . COPD (chronic obstructive pulmonary disease) ([place])   . Diabetes mellitus without complication ([place])   . Diverticulosis   . Early cataracts, bilateral 11/21/2016  . Emphysema of lung ([place])   . GERD (gastroesophageal reflux disease)   . Glaucoma   . Hyperlipidemia   . Hypertension   . Internal hemorrhoids   . Sleep apnea   . Spasm of the cricopharyngeus muscle     Past Surgical History:  Procedure Laterality Date  . ABDOMINAL HYSTERECTOMY     cervical dysplasia  . BREAST BIOPSY Left   . LEFT HEART CATH AND CORONARY ANGIOGRAPHY N/A 06/11/2017   Procedure: LEFT HEART CATH AND CORONARY ANGIOGRAPHY;  Surgeon: Belva Crome, MD;  Location: [facility];  Service:  Cardiovascular;  Laterality: N/A;    Outpatient Medications Prior to Visit  Medication Sig Dispense Refill  . albuterol (PROVENTIL) (2.5 MG/3ML) 0.083% nebulizer solution Take 3 mLs (2.5 mg total) every 6 (six) hours as needed by nebulization for wheezing or shortness of breath. 75 mL 3  . ANORO ELLIPTA 62.5-25 MCG/INH AEPB INHALE 1 PUFF BY MOUTH ONCE DAILY FOR 1 DAY    . aspirin EC 81 MG tablet Take 81 mg by mouth daily.    Marland Kitchen atorvastatin (LIPITOR) 40 MG tablet Take 1 tablet (40 mg total) by mouth daily. (Patient taking differently: Take 40 mg by mouth every evening. ) 90 tablet 3  . Blood Glucose Monitor Software DEVI Test BS BID and PRN Dx E11.9 1 Device 1  . CALCIUM PO Take 1 tablet by mouth daily.    . Cholecalciferol (VITAMIN D3) 2000 units TABS Take 4,000 Units by mouth daily.    . fluconazole (DIFLUCAN) 100 MG tablet Take 2 tablet by mouth on day 1, then take 1 tablet by mouth daily on days 2- 21. 22 tablet 0  . glimepiride (AMARYL) 2 MG tablet Take 1 tablet (2 mg total) by mouth daily with breakfast. 90 tablet 1  . glucose blood test strip test BS BID Dx E11.9 200 each 3  . isosorbide mononitrate (IMDUR) 30 MG 24 hr tablet Take 1 tablet (30 mg total) by mouth 2 (two) times daily. [place]  tablet 1  . Lancets (ONETOUCH ULTRASOFT) lancets Test BS BID Dx E11.9 200 each 3  . losartan (COZAAR) 25 MG tablet Take 1 tablet (25 mg total) by mouth daily. 90 tablet 3  . metFORMIN (GLUCOPHAGE) 1000 MG tablet Take 1 tablet (1,000 mg total) by mouth 2 (two) times daily with a meal. 180 tablet 3  . metoprolol tartrate (LOPRESSOR) 50 MG tablet Take 1 tablet (50 mg total) by mouth 2 (two) times daily. 180 tablet 1  . Multiple Vitamins-Minerals (MULTIVITAMIN PO) Take 1 tablet by mouth daily.    . nitroGLYCERIN (NITROSTAT) 0.4 MG SL tablet Place 1 tablet (0.4 mg total) under the tongue every 5 (five) minutes as needed for chest pain. 25 tablet 3  . omeprazole (PRILOSEC) 20 MG capsule Take 1 capsule (20 mg  total) by mouth 2 (two) times daily before a meal. 180 capsule 3   No facility-administered medications prior to visit.     No Known Allergies  Family History  Problem Relation Age of Onset  . Cancer Mother        breast  . Arthritis Mother   . COPD Father 55       emphysema  . Alcohol abuse Father   . Diabetes Father   . Cancer Sister        breast  . Early death Brother        overdose  . Drug abuse Brother   . Alcohol abuse Paternal Aunt   . COPD Paternal 37   . Cancer Maternal Grandmother   . Diabetes Maternal Grandmother   . Alcohol abuse Maternal Grandfather   . Early death Maternal Grandfather   . Stroke Paternal Grandmother   . Heart disease Paternal Grandfather     Social History   Tobacco Use  . Smoking status: Former Smoker    Packs/day: 1.50    Years: 41.00    Pack years: 61.50    Types: Cigarettes    Start date: 1969    Last attempt to quit: 2010    Years since quitting: 10.2  . Smokeless tobacco: Never Used  Substance Use Topics  . Alcohol use: No    Frequency: Never  . Drug use: No    ROS: As per history of present illness, otherwise negative  There were no vitals taken for this visit. NO PE, virtual visit  RELEVANT LABS AND IMAGING: CBC    Component Value Date/Time   WBC 2.6 (L) 03/23/2018 0954   WBC 4.2 12/11/2017 1434   RBC 3.72 (L) 03/23/2018 0954   RBC 3.89 12/11/2017 1434   HGB 10.3 (L) 03/23/2018 0954   HCT 33.8 (L) 03/23/2018 0954   PLT 127 (L) 03/23/2018 0954   MCV 91 03/23/2018 0954   MCH 27.7 03/23/2018 0954   MCH 28.0 06/09/2017 1502   MCHC 30.5 (L) 03/23/2018 0954   MCHC 33.7 12/11/2017 1434   RDW 14.7 03/23/2018 0954   LYMPHSABS 0.5 (L) 03/23/2018 0954   MONOABS 0.5 12/11/2017 1434   EOSABS 0.1 03/23/2018 0954   BASOSABS 0.0 03/23/2018 0954    CMP     Component Value Date/Time   NA 136 03/23/2018 0954   K 4.1 03/23/2018 0954   CL 96 03/23/2018 0954   CO2 21 03/23/2018 0954   GLUCOSE 414 (HH) 03/23/2018  0954   GLUCOSE 129 (H) 06/09/2017 1502   BUN 14 03/23/2018 0954   CREATININE 0.71 03/23/2018 0954   CREATININE 0.56 11/21/2016 1012   CALCIUM 9.7 03/23/2018  0954   PROT 6.6 03/23/2018 0954   ALBUMIN 4.2 03/23/2018 0954   AST 10 03/23/2018 0954   ALT 13 03/23/2018 0954   ALKPHOS 106 03/23/2018 0954   BILITOT 0.3 03/23/2018 0954   GFRNONAA 88 03/23/2018 0954   GFRNONAA 96 11/21/2016 1012   GFRAA 101 03/23/2018 0954   GFRAA 112 11/21/2016 1012    ASSESSMENT/PLAN: 68 year old female with a history of COPD, GERD, diverticulosis, hypertension, hyperlipidemia, sleep apnea recently seen to evaluate odynophagia and treated with empiric fluconazole therapy x21 days.  1.  Persistent odynophagia --no response despite empiric antifungal therapy x21 days.  This warrants upper endoscopy.  We discussed the risk, benefits and alternatives and she is agreeable and wishes to proceed.  We will tentatively schedule for April 08, 2018 in the morning during my procedure block for urgent patients during the COVID-19 pandemic  2.  Mild pancytopenia --relative decline in all of her blood counts seen on primary visit on 03/23/2018.  Perhaps this was secondary to fluconazole therapy.  She has follow-up labs ordered for Monday, 04/05/2018.  I will review these early next week to ensure it remains appropriate to continue with endoscopy as discussed in #1   LD:JTTSV, Connye Burkitt, [address] [place], [place] [id number]

## 2018-04-01 NOTE — Addendum Note (Signed)
Addended by: Larina Bras on: 04/01/2018 02:20 PM   Modules accepted: Orders

## 2018-04-01 NOTE — Patient Instructions (Signed)
You have been scheduled for an endoscopy. Please follow written instructions given to you at your visit today. If you use inhalers (even only as needed), please bring them with you on the day of your procedure. Your physician has requested that you go to www.startemmi.com and enter the access code given to you at your visit today. This web site gives a general overview about your procedure. However, you should still follow specific instructions given to you by our office regarding your preparation for the procedure.  It was a pleasure to speak to you today!  Dr. Hilarie Fredrickson

## 2018-04-04 ENCOUNTER — Other Ambulatory Visit: Payer: Self-pay | Admitting: Cardiovascular Disease

## 2018-04-05 ENCOUNTER — Other Ambulatory Visit: Payer: Self-pay

## 2018-04-05 ENCOUNTER — Other Ambulatory Visit: Payer: Medicare HMO

## 2018-04-05 DIAGNOSIS — R69 Illness, unspecified: Secondary | ICD-10-CM | POA: Diagnosis not present

## 2018-04-05 DIAGNOSIS — R7309 Other abnormal glucose: Secondary | ICD-10-CM

## 2018-04-05 DIAGNOSIS — R7989 Other specified abnormal findings of blood chemistry: Secondary | ICD-10-CM

## 2018-04-05 LAB — CMP14+EGFR
ALK PHOS: 90 IU/L (ref 39–117)
ALT: 16 IU/L (ref 0–32)
AST: 13 IU/L (ref 0–40)
Albumin/Globulin Ratio: 2.1 (ref 1.2–2.2)
Albumin: 4.5 g/dL (ref 3.8–4.8)
BILIRUBIN TOTAL: 0.4 mg/dL (ref 0.0–1.2)
BUN/Creatinine Ratio: 17 (ref 12–28)
BUN: 11 mg/dL (ref 8–27)
CO2: 23 mmol/L (ref 20–29)
CREATININE: 0.64 mg/dL (ref 0.57–1.00)
Calcium: 10 mg/dL (ref 8.7–10.3)
Chloride: 99 mmol/L (ref 96–106)
GFR calc Af Amer: 106 mL/min/{1.73_m2} (ref >59)
GFR calc non Af Amer: 92 mL/min/{1.73_m2} (ref >59)
GLUCOSE: 140 mg/dL — AB (ref 65–99)
Globulin, Total: 2.1 g/dL (ref 1.5–4.5)
Potassium: 4.8 mmol/L (ref 3.5–5.2)
Sodium: 139 mmol/L (ref 134–144)
Total Protein: 6.6 g/dL (ref 6.0–8.5)

## 2018-04-05 LAB — CBC WITH DIFFERENTIAL/PLATELET
Basophils Absolute: 0 10*3/uL (ref 0.0–0.2)
Basos: 0 %
EOS (ABSOLUTE): 0.1 10*3/uL (ref 0.0–0.4)
Eos: 2 %
HEMOGLOBIN: 10.8 g/dL — AB (ref 11.1–15.9)
Hematocrit: 32.5 % — ABNORMAL LOW (ref 34.0–46.6)
Immature Grans (Abs): 0 10*3/uL (ref 0.0–0.1)
Immature Granulocytes: 0 %
LYMPHS ABS: 1.1 10*3/uL (ref 0.7–3.1)
Lymphs: 24 %
MCH: 28.2 pg (ref 26.6–33.0)
MCHC: 33.2 g/dL (ref 31.5–35.7)
MCV: 85 fL (ref 79–97)
Monocytes Absolute: 0.5 10*3/uL (ref 0.1–0.9)
Monocytes: 11 %
Neutrophils Absolute: 2.9 10*3/uL (ref 1.4–7.0)
Neutrophils: 63 %
Platelets: 177 10*3/uL (ref 150–450)
RBC: 3.83 x10E6/uL (ref 3.77–5.28)
RDW: 14.8 % (ref 11.7–15.4)
WBC: 4.6 10*3/uL (ref 3.4–10.8)

## 2018-04-07 ENCOUNTER — Encounter: Payer: Self-pay | Admitting: Internal Medicine

## 2018-04-07 ENCOUNTER — Telehealth: Payer: Self-pay

## 2018-04-07 NOTE — Telephone Encounter (Addendum)
Covid-19 travel screening questions  Have you traveled in the last 14 days? No.  If yes where?  Do you now or have you had a fever in the last 14 days? No.  Do you have any respiratory symptoms of shortness of breath or cough now or in the last 14 days? No.   Do you have a medical history of Congestive Heart Failure?   Do you have a medical history of lung disease?  Do you have any family members or close contacts with diagnosed or suspected Covid-19? No.      Left message at 11:00 for patient to call me back to go over COVID-19 screening questions. Also explained about our new care partner policy in which the care partner will stay in their car in the parking lot.   Patient called back and answered all screening questions and then we reviewed the "No care partner" in the lobby policy. She understood and no further questions we asked.

## 2018-04-07 NOTE — Telephone Encounter (Signed)
Left patient a message to come in 30 minutes later, per Dr. Hilarie Fredrickson he has a meeting first thing in the morning and will not be available to start until 8:30.

## 2018-04-08 ENCOUNTER — Other Ambulatory Visit: Payer: Self-pay

## 2018-04-08 ENCOUNTER — Ambulatory Visit (AMBULATORY_SURGERY_CENTER): Payer: Medicare HMO | Admitting: Internal Medicine

## 2018-04-08 ENCOUNTER — Encounter: Payer: Self-pay | Admitting: Internal Medicine

## 2018-04-08 VITALS — BP 145/74 | HR 74 | Temp 97.6°F | Resp 22 | Ht 61.0 in | Wt 159.0 lb

## 2018-04-08 DIAGNOSIS — C154 Malignant neoplasm of middle third of esophagus: Secondary | ICD-10-CM

## 2018-04-08 DIAGNOSIS — R1319 Other dysphagia: Secondary | ICD-10-CM | POA: Diagnosis not present

## 2018-04-08 DIAGNOSIS — R1310 Dysphagia, unspecified: Secondary | ICD-10-CM | POA: Diagnosis not present

## 2018-04-08 MED ORDER — SODIUM CHLORIDE 0.9 % IV SOLN: 500.0000 mL | Freq: Once | INTRAVENOUS | Status: DC

## 2018-04-08 NOTE — Op Note (Signed)
[facility] Patient Name: Erica Braun Procedure Date: 04/08/2018 7:29 AM MRN: [id number] Endoscopist: Jerene Bears , MD Age: 68 Referring MD:  Date of Birth: [birth year] Gender: Female Account #: [id number] Procedure:                Upper GI endoscopy Indications:              Odynophagia (persistent after empiric antifungal                            and in the setting of BID PPI) Medicines:                Monitored Anesthesia Care Procedure:                Pre-Anesthesia Assessment:                           - Prior to the procedure, a History and Physical                            was performed, and patient medications and                            allergies were reviewed. The patient's tolerance of                            previous anesthesia was also reviewed. The risks                            and benefits of the procedure and the sedation                            options and risks were discussed with the patient.                            All questions were answered, and informed consent                            was obtained. Prior Anticoagulants: The patient has                            taken no previous anticoagulant or antiplatelet                            agents. ASA Grade Assessment: II - A patient with                            mild systemic disease. After reviewing the risks                            and benefits, the patient was deemed in                            satisfactory condition to undergo the procedure.  After obtaining informed consent, the endoscope was                            passed under direct vision. Throughout the                            procedure, the patient's blood pressure, pulse, and                            oxygen saturations were monitored continuously. The                            Endoscope was introduced through the mouth, and                            advanced to the second  part of duodenum. The upper                            GI endoscopy was accomplished without difficulty.                            The patient tolerated the procedure well. Scope In: Scope Out: Findings:                 Moderately severe esophagitis with no bleeding was                            found 24 to 28 cm from the incisors. This is                            characterized by nodularity, granularity, erosions,                            shallow ulcerations and is circumferential in this                            segment. Biopsies (multiple and targeted) were                            taken with a cold forceps for histology.                           The Z-line was regular and was found 35 cm from the                            incisors.                           The entire examined stomach was normal.                           The examined duodenum was normal. Complications:            No immediate complications. Estimated Blood Loss:     Estimated blood loss was minimal. Impression:               -  Moderately severe erosive esophagitis. Biopsied.                           - Z-line regular, 35 cm from the incisors.                           - Normal stomach.                           - Normal examined duodenum. Recommendation:           - Patient has a contact number available for                            emergencies. The signs and symptoms of potential                            delayed complications were discussed with the                            patient. Return to normal activities tomorrow.                            Written discharge instructions were provided to the                            patient.                           - Soft diet.                           - Continue present medications.                           - Await pathology results. Jerene Bears, MD 04/08/2018 8:58:27 AM This report has been signed electronically.

## 2018-04-08 NOTE — Progress Notes (Signed)
PT taken to PACU. Monitors in place. VSS. Report given to RN. 

## 2018-04-08 NOTE — Patient Instructions (Signed)
YOU HAD AN ENDOSCOPIC PROCEDURE TODAY AT [facility]:   Refer to the procedure report that was given to you for any specific questions about what was found during the examination.  If the procedure report does not answer your questions, please call your gastroenterologist to clarify.  If you requested that your care partner not be given the details of your procedure findings, then the procedure report has been included in a sealed envelope for you to review at your convenience later.  YOU SHOULD EXPECT: Some feelings of bloating in the abdomen. Passage of more gas than usual.  Walking can help get rid of the air that was put into your GI tract during the procedure and reduce the bloating.   Please Note:  You might notice some irritation and congestion in your nose or some drainage.  This is from the oxygen used during your procedure.  There is no need for concern and it should clear up in a day or so.  SYMPTOMS TO REPORT IMMEDIATELY:    Following upper endoscopy (EGD)  Vomiting of blood or coffee ground material  New chest pain or pain under the shoulder blades  Painful or persistently difficult swallowing  New shortness of breath  Fever of 100F or higher  Black, tarry-looking stools  For urgent or emergent issues, a gastroenterologist can be reached at any hour by calling [phone].   DIET:  We do recommend a small meal at first, but then you may proceed to a soft diet until further notice.  Drink plenty of fluids but you should avoid alcoholic beverages for 24 hours.  ACTIVITY:  You should plan to take it easy for the rest of today and you should NOT DRIVE or use heavy machinery until tomorrow (because of the sedation medicines used during the test).    FOLLOW UP: Our staff will call the number listed on your records the next business day following your procedure to check on you and address any questions or concerns that you may have regarding the information given  to you following your procedure. If we do not reach you, we will leave a message.  However, if you are feeling well and you are not experiencing any problems, there is no need to return our call.  We will assume that you have returned to your regular daily activities without incident.  If any biopsies were taken you will be contacted by phone or by letter within the next 1-3 weeks.  Please call us at [phone] if you have not heard about the biopsies in 3 weeks.    SIGNATURES/CONFIDENTIALITY: You and/or your care partner have signed paperwork which will be entered into your electronic medical record.  These signatures attest to the fact that that the information above on your After Visit Summary has been reviewed and is understood.  Full responsibility of the confidentiality of this discharge information lies with you and/or your care-partner.  Read all handouts given to you by your recovery room nurse.

## 2018-04-09 DIAGNOSIS — G4733 Obstructive sleep apnea (adult) (pediatric): Secondary | ICD-10-CM | POA: Diagnosis not present

## 2018-04-12 ENCOUNTER — Telehealth: Payer: Self-pay

## 2018-04-12 NOTE — Telephone Encounter (Signed)
  Follow up Call-  Call back number 04/08/2018  Post procedure Call Back phone  # [phone]  Permission to leave phone message Yes  Some recent data might be hidden     Patient questions:  Do you have a fever, pain , or abdominal swelling? No. Pain Score  0 *  Have you tolerated food without any problems? Yes.    Have you been able to return to your normal activities? Yes.    Do you have any questions about your discharge instructions: Diet   No. Medications  No. Follow up visit  No.  Do you have questions or concerns about your Care? No.  Actions: * If pain score is 4 or above: No action needed, pain <4.

## 2018-04-20 ENCOUNTER — Other Ambulatory Visit: Payer: Self-pay

## 2018-04-20 DIAGNOSIS — C159 Malignant neoplasm of esophagus, unspecified: Secondary | ICD-10-CM

## 2018-04-22 ENCOUNTER — Telehealth: Payer: Self-pay | Admitting: *Deleted

## 2018-04-22 NOTE — Telephone Encounter (Signed)
Covid-19 travel screening questions  Have you traveled in the last 14 days? If yes where? No Do you now or have you had a fever in the last 14 days? No Do you have any respiratory symptoms of shortness of breath or cough now or in the last 14 days? No Do you have any family members or close contacts with diagnosed or suspected Covid-19? No      

## 2018-04-23 ENCOUNTER — Other Ambulatory Visit: Payer: Self-pay

## 2018-04-23 ENCOUNTER — Ambulatory Visit (INDEPENDENT_AMBULATORY_CARE_PROVIDER_SITE_OTHER)
Admission: RE | Admit: 2018-04-23 | Discharge: 2018-04-23 | Disposition: A | Discharge status: Home / Self Care | Payer: Medicare HMO | Source: Ambulatory Visit | Attending: Internal Medicine | Admitting: Internal Medicine

## 2018-04-23 ENCOUNTER — Other Ambulatory Visit: Payer: Self-pay | Admitting: Internal Medicine

## 2018-04-23 DIAGNOSIS — C159 Malignant neoplasm of esophagus, unspecified: Secondary | ICD-10-CM

## 2018-04-23 MED ORDER — IOHEXOL 300 MG/ML  SOLN
100.0000 mL | Freq: Once | INTRAMUSCULAR | Status: AC | PRN
  Administered 2018-04-23: 10:00:00 100 mL via INTRAVENOUS

## 2018-04-26 ENCOUNTER — Other Ambulatory Visit: Payer: Self-pay | Admitting: *Deleted

## 2018-04-26 DIAGNOSIS — C159 Malignant neoplasm of esophagus, unspecified: Secondary | ICD-10-CM

## 2018-04-27 ENCOUNTER — Other Ambulatory Visit: Payer: Self-pay | Admitting: *Deleted

## 2018-04-27 DIAGNOSIS — C159 Malignant neoplasm of esophagus, unspecified: Secondary | ICD-10-CM

## 2018-04-30 ENCOUNTER — Other Ambulatory Visit: Payer: Self-pay

## 2018-04-30 ENCOUNTER — Encounter (HOSPITAL_COMMUNITY): Payer: Self-pay | Admitting: *Deleted

## 2018-04-30 NOTE — Progress Notes (Signed)
Spoke with pt for pre-op call. Pt has hx of CAD, cath done in June 2019. Pt sees Dr. Jacinta Shoe. Pt denies any recent chest pain. States she is always SOB with her emphysema. Pt is a type 2 Diabetic. Last A1C was 6.4 a month ago. She states her fasting blood sugar is usually around 130. Pt instructed not to take her Glimepiride Sunday PM and Monday AM. And no Metformin Monday AM. She voiced understanding. Instructed pt to check her blood sugar Monday AM when she gets ups. If blood sugar is 70 or below, treat with 1/2 cup of clear juice (apple or cranberry) and recheck blood sugar 15 minutes after drinking juice. Pt. Voiced understanding.   Coronavirus Screening  Have you experienced the following symptoms:  Cough no Fever (>100.96F) no Sore throat no Difficulty breathing/shortness of breath  no  Have you or a family member traveled in the last 14 days and where? no

## 2018-05-03 ENCOUNTER — Encounter (HOSPITAL_COMMUNITY)
Admission: RE | Disposition: A | Discharge status: Home / Self Care | Payer: Self-pay | Source: Home / Self Care | Attending: Gastroenterology

## 2018-05-03 ENCOUNTER — Ambulatory Visit (HOSPITAL_COMMUNITY): Payer: Medicare HMO | Admitting: Anesthesiology

## 2018-05-03 ENCOUNTER — Ambulatory Visit (HOSPITAL_COMMUNITY)
Admission: RE | Admit: 2018-05-03 | Discharge: 2018-05-03 | Disposition: A | Discharge status: Home / Self Care | Payer: Medicare HMO | Attending: Gastroenterology | Admitting: Gastroenterology

## 2018-05-03 ENCOUNTER — Encounter (HOSPITAL_COMMUNITY): Payer: Self-pay | Admitting: Anesthesiology

## 2018-05-03 ENCOUNTER — Other Ambulatory Visit: Payer: Self-pay

## 2018-05-03 DIAGNOSIS — Z811 Family history of alcohol abuse and dependence: Secondary | ICD-10-CM | POA: Diagnosis not present

## 2018-05-03 DIAGNOSIS — K259 Gastric ulcer, unspecified as acute or chronic, without hemorrhage or perforation: Secondary | ICD-10-CM | POA: Diagnosis not present

## 2018-05-03 DIAGNOSIS — Z825 Family history of asthma and other chronic lower respiratory diseases: Secondary | ICD-10-CM | POA: Diagnosis not present

## 2018-05-03 DIAGNOSIS — K228 Other specified diseases of esophagus: Secondary | ICD-10-CM

## 2018-05-03 DIAGNOSIS — I10 Essential (primary) hypertension: Secondary | ICD-10-CM | POA: Diagnosis not present

## 2018-05-03 DIAGNOSIS — G4730 Sleep apnea, unspecified: Secondary | ICD-10-CM | POA: Insufficient documentation

## 2018-05-03 DIAGNOSIS — K5790 Diverticulosis of intestine, part unspecified, without perforation or abscess without bleeding: Secondary | ICD-10-CM | POA: Diagnosis not present

## 2018-05-03 DIAGNOSIS — K2950 Unspecified chronic gastritis without bleeding: Secondary | ICD-10-CM | POA: Diagnosis not present

## 2018-05-03 DIAGNOSIS — Z90710 Acquired absence of both cervix and uterus: Secondary | ICD-10-CM | POA: Diagnosis not present

## 2018-05-03 DIAGNOSIS — C159 Malignant neoplasm of esophagus, unspecified: Secondary | ICD-10-CM

## 2018-05-03 DIAGNOSIS — Z823 Family history of stroke: Secondary | ICD-10-CM | POA: Diagnosis not present

## 2018-05-03 DIAGNOSIS — K2210 Ulcer of esophagus without bleeding: Secondary | ICD-10-CM | POA: Diagnosis not present

## 2018-05-03 DIAGNOSIS — H409 Unspecified glaucoma: Secondary | ICD-10-CM | POA: Insufficient documentation

## 2018-05-03 DIAGNOSIS — K3189 Other diseases of stomach and duodenum: Secondary | ICD-10-CM

## 2018-05-03 DIAGNOSIS — Z8261 Family history of arthritis: Secondary | ICD-10-CM | POA: Diagnosis not present

## 2018-05-03 DIAGNOSIS — Z87891 Personal history of nicotine dependence: Secondary | ICD-10-CM | POA: Diagnosis not present

## 2018-05-03 DIAGNOSIS — K219 Gastro-esophageal reflux disease without esophagitis: Secondary | ICD-10-CM | POA: Diagnosis not present

## 2018-05-03 DIAGNOSIS — Z833 Family history of diabetes mellitus: Secondary | ICD-10-CM | POA: Insufficient documentation

## 2018-05-03 DIAGNOSIS — E785 Hyperlipidemia, unspecified: Secondary | ICD-10-CM | POA: Insufficient documentation

## 2018-05-03 DIAGNOSIS — E119 Type 2 diabetes mellitus without complications: Secondary | ICD-10-CM | POA: Diagnosis not present

## 2018-05-03 DIAGNOSIS — K2100 Gastro-esophageal reflux disease with esophagitis, without bleeding: Secondary | ICD-10-CM

## 2018-05-03 DIAGNOSIS — Z803 Family history of malignant neoplasm of breast: Secondary | ICD-10-CM | POA: Diagnosis not present

## 2018-05-03 DIAGNOSIS — I2510 Atherosclerotic heart disease of native coronary artery without angina pectoris: Secondary | ICD-10-CM | POA: Insufficient documentation

## 2018-05-03 DIAGNOSIS — Z8249 Family history of ischemic heart disease and other diseases of the circulatory system: Secondary | ICD-10-CM | POA: Insufficient documentation

## 2018-05-03 DIAGNOSIS — K2970 Gastritis, unspecified, without bleeding: Secondary | ICD-10-CM

## 2018-05-03 DIAGNOSIS — I899 Noninfective disorder of lymphatic vessels and lymph nodes, unspecified: Secondary | ICD-10-CM | POA: Diagnosis not present

## 2018-05-03 DIAGNOSIS — J439 Emphysema, unspecified: Secondary | ICD-10-CM | POA: Diagnosis not present

## 2018-05-03 HISTORY — PX: BIOPSY: SHX5522

## 2018-05-03 HISTORY — DX: Other complications of anesthesia, initial encounter: T88.59XA

## 2018-05-03 HISTORY — DX: Other specified postprocedural states: Z98.890

## 2018-05-03 HISTORY — DX: Atherosclerotic heart disease of native coronary artery without angina pectoris: I25.10

## 2018-05-03 HISTORY — DX: Adverse effect of unspecified anesthetic, initial encounter: T41.45XA

## 2018-05-03 HISTORY — DX: Pneumonia, unspecified organism: J18.9

## 2018-05-03 HISTORY — PX: EUS: SHX5427

## 2018-05-03 HISTORY — PX: ESOPHAGOGASTRODUODENOSCOPY (EGD) WITH PROPOFOL: SHX5813

## 2018-05-03 HISTORY — DX: Other specified postprocedural states: R11.2

## 2018-05-03 LAB — GLUCOSE, CAPILLARY: Glucose-Capillary: 191 mg/dL — ABNORMAL HIGH (ref 70–99)

## 2018-05-03 SURGERY — ESOPHAGOGASTRODUODENOSCOPY (EGD) WITH PROPOFOL
Anesthesia: General

## 2018-05-03 MED ORDER — ALBUTEROL SULFATE HFA 108 (90 BASE) MCG/ACT IN AERS
INHALATION_SPRAY | RESPIRATORY_TRACT | Status: DC | PRN
  Administered 2018-05-03: 09:00:00
  Administered 2018-05-03: 2 via RESPIRATORY_TRACT

## 2018-05-03 MED ORDER — OMEPRAZOLE 40 MG PO CPDR: 40.0000 mg | DELAYED_RELEASE_CAPSULE | Freq: Two times a day (BID) | ORAL | 3 refills | Status: DC

## 2018-05-03 MED ORDER — ONDANSETRON HCL 4 MG/2ML IJ SOLN
INTRAMUSCULAR | Status: DC | PRN
  Administered 2018-05-03: 4 mg via INTRAVENOUS

## 2018-05-03 MED ORDER — FENTANYL CITRATE (PF) 100 MCG/2ML IJ SOLN
INTRAMUSCULAR | Status: DC | PRN
  Administered 2018-05-03: 50 ug via INTRAVENOUS

## 2018-05-03 MED ORDER — LACTATED RINGERS IV SOLN
INTRAVENOUS | Status: DC | PRN
  Administered 2018-05-03: 07:00:00 via INTRAVENOUS

## 2018-05-03 MED ORDER — SODIUM CHLORIDE 0.9 % IV SOLN: INTRAVENOUS | Status: DC

## 2018-05-03 MED ORDER — PROPOFOL 10 MG/ML IV BOLUS
INTRAVENOUS | Status: DC | PRN
  Administered 2018-05-03: 100 mg via INTRAVENOUS

## 2018-05-03 MED ORDER — EPHEDRINE SULFATE-NACL 50-0.9 MG/10ML-% IV SOSY
PREFILLED_SYRINGE | INTRAVENOUS | Status: DC | PRN
  Administered 2018-05-03: 10 mg via INTRAVENOUS
  Administered 2018-05-03: 15 mg via INTRAVENOUS

## 2018-05-03 MED ORDER — DEXAMETHASONE SODIUM PHOSPHATE 10 MG/ML IJ SOLN
INTRAMUSCULAR | Status: DC | PRN
  Administered 2018-05-03: 5 mg via INTRAVENOUS

## 2018-05-03 MED ORDER — LIDOCAINE 2% (20 MG/ML) 5 ML SYRINGE
INTRAMUSCULAR | Status: DC | PRN
  Administered 2018-05-03: 40 mg via INTRAVENOUS

## 2018-05-03 MED ORDER — SUCCINYLCHOLINE CHLORIDE 200 MG/10ML IV SOSY
PREFILLED_SYRINGE | INTRAVENOUS | Status: DC | PRN
  Administered 2018-05-03: 100 mg via INTRAVENOUS

## 2018-05-03 SURGICAL SUPPLY — 14 items

## 2018-05-03 NOTE — Progress Notes (Addendum)
Pt arrived to recovery area with O2 sat of 86%, placed on 4L [place]. Pt given incentive spirometer and educated on use. Weaned O2 down to room air. However, pt O2 sat 85%-92% on room air. Spoke with MD Mansouraty and MD Smith Robert with anesthesia.

## 2018-05-03 NOTE — Transfer of Care (Signed)
Immediate Anesthesia Transfer of Care Note  Patient: Erica Braun  Procedure(s) Performed: ESOPHAGOGASTRODUODENOSCOPY (EGD) WITH PROPOFOL (N/A ) UPPER ENDOSCOPIC ULTRASOUND (EUS) LINEAR (N/A ) BIOPSY  Patient Location: Endoscopy Unit  Anesthesia Type:General  Level of Consciousness: awake, alert  and oriented  Airway & Oxygen Therapy: Patient Spontanous Breathing and Patient connected to nasal cannula oxygen  Post-op Assessment: Report given to RN  Post vital signs: Reviewed and stable  Last Vitals:  Vitals Value Taken Time  BP 171/85 05/03/2018  8:38 AM  Temp    Pulse 114 05/03/2018  8:39 AM  Resp 13 05/03/2018  8:39 AM  SpO2 93 % 05/03/2018  8:39 AM  Vitals shown include unvalidated device data.  Last Pain:  Vitals:   05/03/18 0648  TempSrc: Oral  PainSc: 0-No pain         Complications: No apparent anesthesia complications

## 2018-05-03 NOTE — Progress Notes (Signed)
Pt meets discharge criteria at this time. Pt has maintained O2 sat >92% on room air for 15-20 minutes. No c/o shortness of breath or dizziness. Updated MD Smith Robert and MD Mansouraty.

## 2018-05-03 NOTE — Op Note (Addendum)
[facility] Patient Name: Erica Braun Procedure Date : 05/03/2018 MRN: [id number] Attending MD: Justice Britain , MD Date of Birth: 1950/04/12 CSN: [id number] Age: 68 Admit Type: Outpatient Procedure:                Upper EUS Indications:              For evaluation of squamous cell esophageal,                            Pre-treatment staging of squamous cell esophageal                            neoplasm Providers:                Justice Britain, MD, Glori Bickers, RN, Carlyn Reichert, RN, Marguerita Merles, Technician, Charolette Child, Technician Referring MD:             Lajuan Lines. Hilarie Fredrickson, MD, Lucilla Edin Branch, Darin                            [place] Medicines:                General Anesthesia Complications:            No immediate complications. Estimated Blood Loss:     Estimated blood loss: none. Procedure:                Pre-Anesthesia Assessment:                           - Prior to the procedure, a History and Physical                            was performed, and patient medications and                            allergies were reviewed. The patient's tolerance of                            previous anesthesia was also reviewed. The risks                            and benefits of the procedure and the sedation                            options and risks were discussed with the patient.                            All questions were answered, and informed consent                            was obtained. Prior Anticoagulants: The patient  has                            taken no previous anticoagulant or antiplatelet                            agents except for aspirin. ASA Grade Assessment:                            III - A patient with severe systemic disease. After                            reviewing the risks and benefits, the patient was                            deemed in satisfactory  condition to undergo the                            procedure.                           After obtaining informed consent, the endoscope was                            passed under direct vision. Throughout the                            procedure, the patient's blood pressure, pulse, and                            oxygen saturations were monitored continuously. The                            [id number]-[id number] ([id number]) Olympus gastroscope was                            introduced through the mouth, and advanced to the                            second part of duodenum. The [id number]-[id number] ([id number])                            Olympus Radial EUS scope was introduced through the                            mouth, and advanced to the stomach for ultrasound                            examination from the esophagus and stomach. The                            upper EUS was accomplished without difficulty. The  patient tolerated the procedure. Findings:      ENDOSCOPIC FINDING: :      No gross lesions were noted in the proximal esophagus.      Diffuse severe mucosal changes characterized by discoloration,       granularity, nodularity, altered texture, an increased vascular pattern       and ulceration were found in the mid esophagus. This has been previously       biopsied as likely SCC with HGD. The lesion itself as noted on NBI and       white light is circumferential from 25 cm to 30 cm and has       contiguoations with areas of ulceration that continue to the 33 cm       demarcation. Biopsies were not reperformed since this could lead to       further scarring and make a potential endoscopic therapy more difficult       to complete.      The Z-line was irregular and was found 39 cm from the incisors.      Scattered mild inflammation characterized by erythema and granularity       was found in the gastric body and in the gastric antrum.      Multiple dispersed, small  non-bleeding erosions were found in the       gastric antrum and in the prepyloric region of the stomach. There were       no stigmata of recent bleeding.      A small amount of food (residue) was found in the entire examined       stomach.      Biopsies were taken with a cold forceps in the cardia, on the greater       curvature of the gastric body, on the lesser curvature of the gastric       body, at the incisura and in the gastric antrum for histology and       Helicobacter pylori testing.      No gross lesions were noted in the duodenal bulb, in the first portion       of the duodenum and in the second portion of the duodenum.      ENDOSONOGRAPHIC FINDING: :      Wall thickening was visualized endosonographically in the thoracic       esophagus. This was encountered at 25 cm from the incisors and extended       to 30 cm. From 30 cm to 33 cm, no gross wall abnormalities were noted.       This appeared to be primarily due to thickening of the luminal       interface/superficial mucosa (Layer 1) and deep mucosa (Layer 2). The       esophageal wall measured up to 3.4->3.9 mm in total thickness. There was       a location in the region of about 28-29 cm where the submucosa could not       be followed completly. Throughout the evaluation on multiple views, I       could not visualize any encroachment into the muscularis propria.      There was no sign of significant endosonographic abnormality in the       cervical esophagus and in the gastroesophageal junction. No masses were       identified.      Two benign-appearing lymph nodes were visualized in the middle       paraesophageal  mediastinum (level 61M) with the ultrasound probe located       26 cm from the incisors. The largest measured 3.0 mm by 3.7 mm in       maximal cross-sectional diameter located at 27-28 cm. Another node       measured 1.7 mm by 1.7 mm. The nodes were round, isoechoic and had well       defined margins.      No  malignant-appearing lymph nodes were visualized in the paracardial       region (level 16), left gastric region (level 17), gastrohepatic       ligament (level 18) and celiac region (level 20).      Endosonographic imaging in the visualized portion of the liver showed no       mass.      The celiac region was visualized. Impression:               EGD Impression:                           - No gross lesions in esophagus.                           - Discolored, granular, nodular, texture changed,                            increased vascular pattern, ulcerated mucosa in the                            esophagus. Previously biopsied with HGD and most                            likely SCC. Did not rebiopsy as this could make ESD                            more difficult if it were to be considered. The                            lesion is partially circumferential but has at lest                            another 3 cm of lesion that can be visualized with                            white-light and NBI. Lugol's stain was not                            available for use today.                           - Z-line irregular, 39 cm from the incisors.                           - A small amount of food (residue) in the stomach.                           -  Gastritis. Non-bleeding erosive gastropathy.                            Biopsied for HP.                           - No gross lesions in the duodenal bulb, in the                            first portion of the duodenum and in the second                            portion of the duodenum.                           EUS Impression:                           - Wall thickening was seen in the thoracic                            esophagus from 25-30 cm was noted. The lesion                            however extends on EGD to at least 33 cm. The                            thickening appeared to primarily be within the                            luminal  interface/superficial mucosa (Layer 1) and                            deep mucosa (Layer 2). However at approximately                            27-28 cm, there was some loss of the submucosa. At                            no point during the EUS was visualization of the                            muscularis propria felt to be involved. This would                            be staged at least T1a but more likely T1b due to                            my inability to visualize one area of the submucosa                            in the middle of the circumferential lesion. From a  nodal perspective, the nodes were small as noted                            below and also adjacent to the lesion.                           - There was no sign of significant pathology in the                            cervical esophagus and in the gastroesophageal                            junction.                           - Two benign lymph nodes were visualized in the                            middle paraesophageal mediastinum (level 23M).                            Tissue has not been obtained. However, the                            endosonographic appearance is suggestive of benign                            nature. They were not sampled.                           - No malignant-appearing lymph nodes were                            visualized in the paracardial region (level 16),                            left gastric region (level 17), gastrohepatic                            ligament (level 18) and celiac region (level 20). Recommendation:           - The patient will be observed post-procedure,                            until all discharge criteria are met.                           - Discharge patient to home.                           - Patient has a contact number available for                            emergencies. The signs and symptoms of potential  delayed complications were discussed with the                            patient. Return to normal activities tomorrow.                            Written discharge instructions were provided to the                            patient.                           - Will discuss with referring provider to consider                            role of PET-CT with likely Oncology referral.                           - Based on the EUS at this point, there does not                            seem to be any evidence of invasion of the lesion                            into the muscularis propria. Staging T1a v T1b can                            be difficult in setting of these superficial                            cancers. But I favor T1b as there was one area that                            felt the submucosa could not be visualized in its                            entirety. The nodes were not sampled due to them                            not meeting EUS criteria and also their size and to                            attempt to reach the largest (which was less than 4                            mm in size) would have involved going through the                            lesion itself. T1bN0Mx would be the current staging                            based on EUS today.                           -  After above, will discuss with [facility]                            [facility] service to consider ESD of lesion (they                            may want PET-CT as well as Oncology referral), so                            we will work on trying to discuss case further                            before next steps in therapeutic management.                           - The findings and recommendations were discussed                            with the patient.                           - The findings and recommendations were discussed                            with the patient's family. Procedure  Code(s):        --- Professional ---                           [phone], Esophagogastroduodenoscopy, flexible,                            transoral; with endoscopic ultrasound examination                            limited to the esophagus, stomach or duodenum, and                            adjacent structures                           43239, Esophagogastroduodenoscopy, flexible,                            transoral; with biopsy, single or multiple Diagnosis Code(s):        --- Professional ---                           K22.8, Other specified diseases of esophagus                           K22.10, Ulcer of esophagus without bleeding                           K29.70, Gastritis, unspecified, without bleeding  K31.89, Other diseases of stomach and duodenum                           I89.9, Noninfective disorder of lymphatic vessels                            and lymph nodes, unspecified                           C15.9, Malignant neoplasm of esophagus, unspecified                           D49.0, Neoplasm of unspecified behavior of                            digestive system CPT copyright 2019 American Medical Association. All rights reserved. The codes documented in this report are preliminary and upon coder review may  be revised to meet current compliance requirements. Justice Britain, MD 05/03/2018 9:25:24 AM Number of Addenda: 0

## 2018-05-03 NOTE — Anesthesia Procedure Notes (Signed)

## 2018-05-03 NOTE — H&P (Signed)
GASTROENTEROLOGY OUTPATIENT PROCEDURE H&P NOTE   Primary Care Physician: Baruch Gouty, [name]  HPI: Erica Braun is a 68 y.o. female who presents for EUS.  Past Medical History:  Diagnosis Date  . Complication of anesthesia   . COPD (chronic obstructive pulmonary disease) ([place])   . Coronary artery disease   . Diabetes mellitus without complication ([place])   . Diverticulosis   . Early cataracts, bilateral 11/21/2016  . Emphysema of lung ([place])   . GERD (gastroesophageal reflux disease)   . Glaucoma   . Hyperlipidemia   . Hypertension   . Internal hemorrhoids   . Pneumonia   . PONV (postoperative nausea and vomiting)   . Sleep apnea    uses Cpap  . Spasm of the cricopharyngeus muscle    Past Surgical History:  Procedure Laterality Date  . ABDOMINAL HYSTERECTOMY     cervical dysplasia  . BREAST BIOPSY Left   . BREAST LUMPECTOMY Left   . LEFT HEART CATH AND CORONARY ANGIOGRAPHY N/A 06/11/2017   Procedure: LEFT HEART CATH AND CORONARY ANGIOGRAPHY;  Surgeon: Belva Crome, MD;  Location: [facility];  Service: Cardiovascular;  Laterality: N/A;   Current Facility-Administered Medications  Medication Dose Route Frequency Provider Last Rate Last Dose  . 0.9 %  sodium chloride infusion   Intravenous Continuous Mansouraty, Telford Nab., MD       No Known Allergies Family History  Problem Relation Age of Onset  . Cancer Mother        breast  . Arthritis Mother   . COPD Father 40       emphysema  . Alcohol abuse Father   . Diabetes Father   . Cancer Sister        breast  . Early death Brother        overdose  . Drug abuse Brother   . Alcohol abuse Paternal Aunt   . COPD Paternal 54   . Cancer Maternal Grandmother   . Diabetes Maternal Grandmother   . Alcohol abuse Maternal Grandfather   . Early death Maternal Grandfather   . Stroke Paternal Grandmother   . Heart disease Paternal Grandfather   . Esophageal cancer Neg Hx   . Stomach cancer Neg Hx     Social History   Socioeconomic History  . Marital status: Married    Spouse name: Jenny Reichmann  . Number of children: 3  . Years of education: 59  . Highest education level: Not on file  Occupational History  . Occupation: retired    Comment: Advertising account planner  Social Needs  . Financial resource strain: Not hard at all  . Food insecurity:    Worry: Never true    Inability: Never true  . Transportation needs:    Medical: No    Non-medical: No  Tobacco Use  . Smoking status: Former Smoker    Packs/day: 1.50    Years: 41.00    Pack years: 61.50    Types: Cigarettes    Start date: 1969    Last attempt to quit: 2010    Years since quitting: 10.3  . Smokeless tobacco: Never Used  Substance and Sexual Activity  . Alcohol use: No    Frequency: Never  . Drug use: No  . Sexual activity: Yes    Birth control/protection: Post-menopausal  Lifestyle  . Physical activity:    Days per week: 4 days    Minutes per session: 30 min  . Stress: Only a little  Relationships  .  Social connections:    Talks on phone: Not on file    Gets together: Not on file    Attends religious service: Not on file    Active member of club or organization: Not on file    Attends meetings of clubs or organizations: Not on file    Relationship status: Not on file  . Intimate partner violence:    Fear of current or ex partner: No    Emotionally abused: No    Physically abused: No    Forced sexual activity: No  Other Topics Concern  . Not on file  Social History Narrative   Recent move from [place] to walk   Married to [employer] dog at home    Physical Exam: Vital signs in last 24 hours: Temp:  [97.9 F (36.6 C)] 97.9 F (36.6 C) (04/27 0648) Resp:  [18] 18 (04/27 0648) BP: (162)/(59) 162/59 (04/27 0648) SpO2:  [93 %] 93 % (04/27 0648) Weight:  [72.6 kg] 72.6 kg (04/27 0648)   GEN: NAD EYE: Sclerae anicteric ENT: MMM CV: RR without R/Gs  RESP: CTAB posteriorly GI: Soft, NT/ND NEURO:   Alert & Oriented x 3  Lab Results: No results for input(s): WBC, HGB, HCT, PLT in the last 72 hours. BMET No results for input(s): NA, K, CL, CO2, GLUCOSE, BUN, CREATININE, CALCIUM in the last 72 hours. LFT No results for input(s): PROT, ALBUMIN, AST, ALT, ALKPHOS, BILITOT, BILIDIR, IBILI in the last 72 hours. PT/INR No results for input(s): LABPROT, INR in the last 72 hours.   Impression / Plan: This is a 68 y.o.female who presents for EUS.  The risks of EUS including bleeding, infection, aspiration pneumonia and intestinal perforation were discussed as was the possibility it may not give a definitive diagnosis.  If a biopsy of the pancreas is done as part of the EUS, there is an additional risk of pancreatitis at the rate of about 1%.  It was explained that procedure related pancreatitis is typically mild, although can be severe and even life threatening, which is why we do not perform random pancreatic biopsies and only biopsy a lesion we feel is concerning enough to warrant the risk.  The risks and benefits of endoscopic evaluation were discussed with the patient; these include but are not limited to the risk of perforation, infection, bleeding, missed lesions, lack of diagnosis, severe illness requiring hospitalization, as well as anesthesia and sedation related illnesses.  The patient is agreeable to proceed.    Justice Britain, MD [facility] Office # [phone]

## 2018-05-03 NOTE — Anesthesia Preprocedure Evaluation (Addendum)
Anesthesia Evaluation  Patient identified by MRN, date of birth, ID band Patient awake    Reviewed: Allergy & Precautions, NPO status , Patient's Chart, lab work & pertinent test results  History of Anesthesia Complications (+) PONV  Airway Mallampati: I  TM Distance: >3 FB Neck ROM: Full    Dental  (+) Edentulous Upper, Edentulous Lower   Pulmonary sleep apnea , COPD,  COPD inhaler, former smoker,    breath sounds clear to auscultation       Cardiovascular hypertension, Pt. on medications and Pt. on home beta blockers + CAD   Rhythm:Regular Rate:Normal     Neuro/Psych negative neurological ROS  negative psych ROS   GI/Hepatic GERD  Medicated,  Endo/Other  diabetes, Type 2, Oral Hypoglycemic Agents  Renal/GU      Musculoskeletal negative musculoskeletal ROS (+)   Abdominal Normal abdominal exam  (+)   Peds  Hematology   Anesthesia Other Findings   Reproductive/Obstetrics                            Lab Results  Component Value Date   WBC 4.6 04/05/2018   HGB 10.8 (L) 04/05/2018   HCT 32.5 (L) 04/05/2018   MCV 85 04/05/2018   PLT 177 04/05/2018   Lab Results  Component Value Date   CREATININE 0.64 04/05/2018   BUN 11 04/05/2018   NA 139 04/05/2018   K 4.8 04/05/2018   CL 99 04/05/2018   CO2 23 04/05/2018   No results found for: INR, PROTIME   Anesthesia Physical Anesthesia Plan  ASA: III  Anesthesia Plan: General   Post-op Pain Management:    Induction: Intravenous  PONV Risk Score and Plan: 4 or greater and Dexamethasone, Midazolam, Ondansetron and Scopolamine patch - Pre-op  Airway Management Planned: Oral ETT  Additional Equipment: None  Intra-op Plan:   Post-operative Plan: Extubation in OR  Informed Consent: I have reviewed the patients History and Physical, chart, labs and discussed the procedure including the risks, benefits and alternatives for the  proposed anesthesia with the patient or authorized representative who has indicated his/her understanding and acceptance.     Dental advisory given  Plan Discussed with: CRNA  Anesthesia Plan Comments:        Anesthesia Quick Evaluation

## 2018-05-03 NOTE — Discharge Instructions (Signed)
YOU HAD AN ENDOSCOPIC PROCEDURE TODAY: Refer to the procedure report and other information in the discharge instructions given to you for any specific questions about what was found during the examination. If this information does not answer your questions, please call [place] office at [phone] to clarify.   YOU SHOULD EXPECT: Some feelings of bloating in the abdomen. Passage of more gas than usual. Walking can help get rid of the air that was put into your GI tract during the procedure and reduce the bloating. If you had a lower endoscopy (such as a colonoscopy or flexible sigmoidoscopy) you may notice spotting of blood in your stool or on the toilet paper. Some abdominal soreness may be present for a day or two, also.  DIET: Your first meal following the procedure should be a light meal and then it is ok to progress to your normal diet. A half-sandwich or bowl of soup is an example of a good first meal. Heavy or fried foods are harder to digest and may make you feel nauseous or bloated. Drink plenty of fluids but you should avoid alcoholic beverages for 24 hours. If you had a esophageal dilation, please see attached instructions for diet.    ACTIVITY: Your care partner should take you home directly after the procedure. You should plan to take it easy, moving slowly for the rest of the day. You can resume normal activity the day after the procedure however YOU SHOULD NOT DRIVE, use power tools, machinery or perform tasks that involve climbing or major physical exertion for 24 hours (because of the sedation medicines used during the test).   SYMPTOMS TO REPORT IMMEDIATELY: A gastroenterologist can be reached at any hour. Please call [phone]  for any of the following symptoms:   Following upper endoscopy (EGD, EUS, ERCP, esophageal dilation) Vomiting of blood or coffee ground material  New, significant abdominal pain  New, significant chest pain or pain under the shoulder blades  Painful or  persistently difficult swallowing  New shortness of breath  Black, tarry-looking or red, bloody stools  FOLLOW UP:  If any biopsies were taken you will be contacted by phone or by letter within the next 1-3 weeks. Call [phone]  if you have not heard about the biopsies in 3 weeks.  Please also call with any specific questions about appointments or follow up tests.  

## 2018-05-03 NOTE — Progress Notes (Signed)
Patient requiring some supplemental O2 to maintain her O2 saturations currently. We will monitor, let her wake up further and see how she does. She has an IS to see if that will aid her. Will decide in the next couple of hours our ability to discharge safely vs need for observation. Had ETT and procedure itself did not have high risk of aspiration though she did have some food stuffs, not felt to be likely, but will need to keep in mind with her underlying lung status.  Justice Britain, MD [facility] Office # [phone]

## 2018-05-03 NOTE — Progress Notes (Addendum)
Walked with pt a short distance around unit with pulse ox. Pt O2 sat down to 80%, pt c/o lightheadedness and dizziness. Assisted pt to wheelchair and back to bed. Placed back on 2L [place] with improvement to O2 sat. Informed MD Mansouraty. Called Pt's husband to give him an update.

## 2018-05-03 NOTE — Anesthesia Postprocedure Evaluation (Signed)
Anesthesia Post Note  Patient: Jenisse Wile  Procedure(s) Performed: ESOPHAGOGASTRODUODENOSCOPY (EGD) WITH PROPOFOL (N/A ) UPPER ENDOSCOPIC ULTRASOUND (EUS) LINEAR (N/A ) BIOPSY     Patient location during evaluation: PACU Anesthesia Type: General Level of consciousness: awake and alert Pain management: pain level controlled Vital Signs Assessment: post-procedure vital signs reviewed and stable Respiratory status: spontaneous breathing, nonlabored ventilation, respiratory function stable and patient connected to nasal cannula oxygen Cardiovascular status: blood pressure returned to baseline and stable Postop Assessment: no apparent nausea or vomiting Anesthetic complications: no Comments: Slow to wean off O2 but saturation > 92% on RA.    Last Vitals:  Vitals:   05/03/18 1000 05/03/18 1010  BP:    Pulse: 89 88  Resp: 10 12  Temp:    SpO2: 97% 95%    Last Pain:  Vitals:   05/03/18 0930  TempSrc:   PainSc: 0-No pain                 Effie Berkshire

## 2018-05-04 ENCOUNTER — Encounter (HOSPITAL_COMMUNITY): Payer: Self-pay | Admitting: Gastroenterology

## 2018-05-05 ENCOUNTER — Telehealth: Payer: Self-pay | Admitting: Gastroenterology

## 2018-05-05 ENCOUNTER — Encounter: Payer: Self-pay | Admitting: Gastroenterology

## 2018-05-05 DIAGNOSIS — C159 Malignant neoplasm of esophagus, unspecified: Secondary | ICD-10-CM

## 2018-05-05 NOTE — Telephone Encounter (Signed)
Called and discussed the results of the patient's biopsies as well as neck steps and evaluation and plan. I am in the process of relaying the results to the advanced endoscopic service at [facility] to consider role of endoscopic submucosal dissection. I think in the interim that in the setting of what is at least T1 a but more likely T1b disease because the submucosa was lost that a discussion with oncology should be had in regards to next steps in evaluation.  A PET CT also is reasonable to consider. I have relayed this information to Dr. Hilarie Fredrickson who will proceed with referrals. I will send the documentation to the advanced endoscopy service. Patient thankful for the call back. She described having some issues with getting the double dose PPI covered through insurance so I have asked her to reach out to [place] if she is still having issues on Thursday so that we can find out through Dr. Hilarie Fredrickson or myself as to what is the issue as she has an indication to be on high-dose therapy for at least 1 to 2 months based on her findings. Patient thankful for her care that she has received.   Justice Britain, MD [facility] Advanced Endoscopy Office # [phone]

## 2018-05-06 NOTE — Addendum Note (Signed)
Addended by: Larina Bras on: 05/06/2018 11:44 AM   Modules accepted: Orders

## 2018-05-06 NOTE — Telephone Encounter (Addendum)
I have spoken to patient to advise that I have contacted her pharmacist who indicates that she has done an override on omeprazole 40 mg twice daily medication. Originally, patient was unable to get omeprazole 40 mg twice daily dosing because she had already picked up 20 mg dosing on 04/26/18. Pharmacy was getting a notice that there was "duplicate therapy." I advised that patient has new diagnosis of esophageal cancer and needs higher PPI dosing. Pharmacist overrode duplicate therapy code and patient is now able to pick up omeprazole 40 mg twice daily dosing. I have spoken to patient to advise her that she may now pick up omeprazole 40 mg twice daily dosing and that she may take her omeprazole 20 mg tablets that she already has as 2 tablets twice daily until those run out before taking her new prescription in order for her to get her money out of the old prescription.  In addition, I have advised patient that a referral has been made on her behalf to oncology to Dr Benay Spice or Dr Burr Medico for esophageal squamous cell carcinoma. She should hear something from the staff there within the next several days.

## 2018-05-06 NOTE — Telephone Encounter (Signed)
Gabe, thanks for your help and input! Dottie, make sure she can get BID PPI

## 2018-05-09 DIAGNOSIS — G4733 Obstructive sleep apnea (adult) (pediatric): Secondary | ICD-10-CM | POA: Diagnosis not present

## 2018-05-10 ENCOUNTER — Telehealth: Payer: Self-pay | Admitting: Hematology

## 2018-05-10 ENCOUNTER — Encounter: Payer: Self-pay | Admitting: Hematology

## 2018-05-10 DIAGNOSIS — C159 Malignant neoplasm of esophagus, unspecified: Secondary | ICD-10-CM

## 2018-05-10 HISTORY — DX: Malignant neoplasm of esophagus, unspecified: C15.9

## 2018-05-10 NOTE — Telephone Encounter (Signed)
A new patient appt has been scheduled for the pt to see Dr. Burr Medico on 5/5 at 1045am.Pt aware to arrive 15-20 minutes early.

## 2018-05-10 NOTE — Progress Notes (Signed)
[facility]   [phone]) [phone] Fax:(336) [phone]   Clinic New Consult Note   Patient Care Team: Rakes, Connye Burkitt, [name] as PCP - General (Family Medicine) Herminio Commons, MD as PCP - Cardiology (Cardiology)  Date of Service:  05/11/2018   CHIEF COMPLAINTS/PURPOSE OF CONSULTATION:  Newly diagnosed Esophogeal Cancer  REFERRING PHYSICIAN:  Dr. Irene Limbo  Oncology History   Cancer Staging Squamous cell esophageal cancer [facility]) Staging form: Esophagus - Squamous Cell Carcinoma, AJCC 8th Edition - Clinical stage from 04/08/2018: Stage I (cT1b, cN0, cM0) - Signed by Truitt Merle, MD on 05/10/2018       Squamous cell esophageal cancer ([place])   04/08/2018 Cancer Staging    Staging form: Esophagus - Squamous Cell Carcinoma, AJCC 8th Edition - Clinical stage from 04/08/2018: Stage I (cT1b, cN0, cM0) - Signed by Truitt Merle, MD on 05/10/2018    04/08/2018 Procedure    Upper Endoscopy Dr. Hilarie Fredrickson 04/08/18  IMPRESSION - Moderately severe erosive esophagitis. Biopsied. - Z-line regular, 35 cm from the incisors. - Normal stomach. - Normal examined duodenum.      04/08/2018 Initial Biopsy    Diagnosis 04/08/18 Surgical [P], mid esophagus - HIGH GRADE SQUAMOUS DYSPLASIA/SQUAMOUS CELL CARCINOMA. - SEE COMMENT.    04/23/2018 Imaging    CT CAP 04/23/18 IMPRESSION: 1. No evidence of metastatic disease within the chest, abdomen, or pelvis. 2. Colonic diverticulosis, without radiographic evidence of diverticulitis. 3. Tiny nonobstructing right renal calculus.    05/03/2018 Procedure    EGD and EUS by Dr. Rush Landmark 05/03/18 IMPRESSION EGD Impression: - No gross lesions in esophagus. - Discolored, granular, nodular, texture changed, increased vascular pattern, ulcerated mucosa in the esophagus. Previously biopsied with HGD and most likely SCC. Did not rebiopsy as this could make ESD more difficult if it were to be considered. The lesion is partially circumferential but has at lest  another 3 cm of lesion that can be visualized with white-light and NBI. Lugol's stain was not available for use today. - Z-line irregular, 39 cm from the incisors. - A small amount of food (residue) in the stomach. - Gastritis. Non-bleeding erosive gastropathy. Biopsied for HP. - No gross lesions in the duodenal bulb, in the first portion of the duodenum and in the second portion of the duodenum.  EUS Impression: - Wall thickening was seen in the thoracic esophagus from 25-30 cm was noted. The lesion however extends on EGD to at least 33 cm. The thickening appeared to primarily be within the luminal interface/superficial mucosa (Layer 1) and deep mucosa (Layer 2). However at approximately 27-28 cm, there was some loss of the submucosa. At no point during the EUS was visualization of the muscularis propria felt to be involved. This would be staged at least T1a but more likely T1b due to my inability to visualize one area of the submucosa in the middle of the circumferential lesion. From a nodal perspective, the nodes were small as noted below and also adjacent to the lesion. - There was no sign of significant pathology in the cervical esophagus and in the gastroesophageal junction. - Two benign lymph nodes were visualized in the middle paraesophageal mediastinum (level 63M). Tissue has not been obtained. However, the endosonographic appearance is suggestive of benign nature. They were not sampled. - No malignant-appearing lymph nodes were visualized in the paracardial region (level 16), left gastric region (level 17), gastrohepatic ligament (level 18) and celiac region (level 20).  Diagnosis Stomach, biopsy - GASTRIC ANTRAL MUCOSA WITH MILD REACTIVE  GASTROPATHY. - GASTRIC OXYNTIC MUCOSA WITH MILD CHRONIC GASTRITIS. - NEGATIVE FOR HELICOBACTER PYLORI.     05/10/2018 Initial Diagnosis    Squamous cell esophageal cancer (HCC)      HISTORY OF PRESENTING ILLNESS:  Erica Braun 68  y.o. female is a here because of newly diagnosed esophogeal cancer. The patient was referred by Dr. Irene Limbo. The patient presents to the clinic today by herself.   She notes having mild odynophagia, mostly discomfort, since 12/2016. She had barium swallow test and passed, so she was not followed up with afterward. During that time she was transitioning from [place] to [place]. She was being treated for gastritis in [place]. She did have her esophagus stretched by GI in [place] but nothing more was noted about her exam. She also has dysphagia due to food getting stuck in her esophagus. She would also have nausea and vomiting from this. With drinking room temperature water this helps food go down.  Today the patient notes she can take Liquids with no issues, but solids and soft foods still get caught. She does not eat spicy or acidic foods or drinks anymore but otherwise eats as she likes. She notes she has not lost any significant weight. She denies change in her bowel habit, she denies black stool. She notes she is fatigued but as long as she is active she can continue being activities.    Socially she is retired, married with 3 children. She did smoke for 35-40 years and quit smoking in 2010. She only drinks wine occasionally.  She has a PMHx of DM and acid reflux. She notes being borderline HTN and was put on Losartan. She also has COPD and uses albuterol. She was hospitalized before for respiratory failure before she quit smoking where she was on oxygen for 6 months. She had a heart cath on 06/11/17 and was showed to have 100% blockage. She was not treated with stent but with medication. This has improved her condition. She had benign lumps removed from her breasts in the past. She continues yearly mammograms. She had a colonoscopy in 2017 which showed diverticulosis.  Her mother had breast cancer, her sister has breast cancer with recurrence. Her maternal grandmother her liver cancer.    REVIEW OF  SYSTEMS:    Constitutional: Denies fevers, chills or abnormal night sweats (+) fatigue  Eyes: Denies blurriness of vision, double vision or watery eyes Ears, nose, mouth, throat, and face: Denies mucositis or sore throat (+) Mild odynophagia and dysphagia  Respiratory: Denies cough, dyspnea or wheezes Cardiovascular: Denies palpitation, chest discomfort or lower extremity swelling Gastrointestinal:  Denies nausea, heartburn or change in bowel habits (+) Acid reflux  Skin: Denies abnormal skin rashes Lymphatics: Denies new lymphadenopathy or easy bruising Neurological:Denies numbness, tingling or new weaknesses Behavioral/Psych: Mood is stable, no new changes  All other systems were reviewed with the patient and are negative.   MEDICAL HISTORY:  Past Medical History:  Diagnosis Date   Complication of anesthesia    COPD (chronic obstructive pulmonary disease) ([place])    Coronary artery disease    Diabetes mellitus without complication ([place])    Diverticulosis    Early cataracts, bilateral 11/21/2016   Emphysema of lung (HCC)    GERD (gastroesophageal reflux disease)    Glaucoma    Hyperlipidemia    Hypertension    Internal hemorrhoids    Pneumonia    PONV (postoperative nausea and vomiting)    Sleep apnea    uses Cpap   Spasm  of the cricopharyngeus muscle    Squamous cell esophageal cancer ([place]) 05/10/2018    SURGICAL HISTORY: Past Surgical History:  Procedure Laterality Date   ABDOMINAL HYSTERECTOMY     cervical dysplasia   BIOPSY  05/03/2018   Procedure: BIOPSY;  Surgeon: Irving Copas., MD;  Location: [facility];  Service: Gastroenterology;;   BREAST BIOPSY Left    BREAST LUMPECTOMY Left    ESOPHAGOGASTRODUODENOSCOPY (EGD) WITH PROPOFOL N/A 05/03/2018   Procedure: ESOPHAGOGASTRODUODENOSCOPY (EGD) WITH PROPOFOL;  Surgeon: Irving Copas., MD;  Location: [facility];  Service: Gastroenterology;  Laterality: N/A;   EUS  05/03/2018    Procedure: UPPER ENDOSCOPIC ULTRASOUND (EUS) RADIAL;  Surgeon: Rush Landmark Telford Nab., MD;  Location: [facility];  Service: Gastroenterology;;   LEFT HEART CATH AND CORONARY ANGIOGRAPHY N/A 06/11/2017   Procedure: LEFT HEART CATH AND CORONARY ANGIOGRAPHY;  Surgeon: Belva Crome, MD;  Location: [facility];  Service: Cardiovascular;  Laterality: N/A;    SOCIAL HISTORY: Social History   Socioeconomic History   Marital status: Married    Spouse name: [name]   Number of children: 3   Years of education: 14   Highest education level: Not on file  Occupational History   Occupation: retired    Comment: court Education officer, museum strain: Not hard at all   Food insecurity:    Worry: Never true    Inability: Never true   Transportation needs:    Medical: No    Non-medical: No  Tobacco Use   Smoking status: Former Smoker    Packs/day: 1.50    Years: 41.00    Pack years: 61.50    Types: Cigarettes    Start date: 1969    Last attempt to quit: 2010    Years since quitting: 10.3   Smokeless tobacco: Never Used  Substance and Sexual Activity   Alcohol use: No    Frequency: Never   Drug use: No   Sexual activity: Yes    Birth control/protection: Post-menopausal  Lifestyle   Physical activity:    Days per week: 4 days    Minutes per session: 30 min   Stress: Only a little  Relationships   Social connections:    Talks on phone: Not on file    Gets together: Not on file    Attends religious service: Not on file    Active member of club or organization: Not on file    Attends meetings of clubs or organizations: Not on file    Relationship status: Not on file   Intimate partner violence:    Fear of current or ex partner: No    Emotionally abused: No    Physically abused: No    Forced sexual activity: No  Other Topics Concern   Not on file  Social History Narrative   Recent move from [place] to walk   Married to [employer] dog at home    FAMILY HISTORY: Family History  Problem Relation Age of Onset   Cancer Mother        breast   Arthritis Mother    COPD Father 78       emphysema   Alcohol abuse Father    Diabetes Father    Cancer Sister        breast   Early death Brother        overdose   Drug abuse Brother    Alcohol abuse  Paternal Aunt    COPD Paternal Aunt    Cancer Maternal Grandmother        liver cancer   Diabetes Maternal Grandmother    Alcohol abuse Maternal Grandfather    Early death Maternal Grandfather    Stroke Paternal Grandmother    Heart disease Paternal Grandfather    Esophageal cancer Neg Hx    Stomach cancer Neg Hx     ALLERGIES:  has No Known Allergies.  MEDICATIONS:  Current Outpatient Medications  Medication Sig Dispense Refill   albuterol (PROVENTIL) (2.5 MG/3ML) 0.083% nebulizer solution Take 3 mLs (2.5 mg total) every 6 (six) hours as needed by nebulization for wheezing or shortness of breath. 75 mL 3   ANORO ELLIPTA 62.5-25 MCG/INH AEPB Inhale 1 puff into the lungs daily.      aspirin EC 81 MG tablet Take 81 mg by mouth daily.     atorvastatin (LIPITOR) 40 MG tablet Take 1 tablet (40 mg total) by mouth daily. (Patient taking differently: Take 40 mg by mouth every evening. ) 90 tablet 3   Blood Glucose Monitor Software DEVI Test BS BID and PRN Dx E11.9 1 Device 1   Cholecalciferol (VITAMIN D3) 2000 units TABS Take 4,000 Units by mouth daily.     glimepiride (AMARYL) 2 MG tablet Take 1 tablet (2 mg total) by mouth daily with breakfast. 90 tablet 1   glucose blood test strip test BS BID Dx E11.9 200 each 3   isosorbide mononitrate (IMDUR) 30 MG 24 hr tablet Take 1 tablet (30 mg total) by mouth 2 (two) times daily. (Patient taking differently: Take 30 mg by mouth daily. ) 180 tablet 1   Lancets (ONETOUCH ULTRASOFT) lancets Test BS BID Dx E11.9 200 each 3   losartan (COZAAR) 25 MG tablet Take 1 tablet (25 mg total) by mouth daily. 90  tablet 3   metFORMIN (GLUCOPHAGE) 1000 MG tablet Take 1 tablet (1,000 mg total) by mouth 2 (two) times daily with a meal. 180 tablet 3   metoprolol tartrate (LOPRESSOR) 50 MG tablet Take 1 tablet by mouth twice daily (Patient taking differently: Take 50 mg by mouth 2 (two) times daily. ) 180 tablet 0   Multiple Vitamins-Minerals (MULTIVITAMIN PO) Take 1 tablet by mouth daily.     omeprazole (PRILOSEC) 40 MG capsule Take 1 capsule (40 mg total) by mouth 2 (two) times daily before a meal. 60 capsule 3   CALCIUM PO Take 1 tablet by mouth daily.     nitroGLYCERIN (NITROSTAT) 0.4 MG SL tablet Place 1 tablet (0.4 mg total) under the tongue every 5 (five) minutes as needed for chest pain. 25 tablet 3   No current facility-administered medications for this visit.     PHYSICAL EXAMINATION: ECOG PERFORMANCE STATUS: 1 - Symptomatic but completely ambulatory  Vitals:   05/11/18 1021  BP: 137/66  Pulse: 90  Resp: 19  Temp: (!) 97.4 F (36.3 C)  SpO2: 95%   Filed Weights   05/11/18 1021  Weight: 158 lb 12.8 oz (72 kg)    GENERAL:alert, no distress and comfortable SKIN: skin color, texture, turgor are normal, no rashes or significant lesions EYES: normal, conjunctiva are pink and non-injected, sclera clear OROPHARYNX:no exudate, no erythema and lips, buccal mucosa, and tongue normal  NECK: supple, thyroid normal size, non-tender, without nodularity LYMPH:  no palpable lymphadenopathy in the cervical, axillary or inguinal LUNGS: clear to auscultation and percussion with normal breathing effort HEART: regular rate & rhythm and no murmurs  and no lower extremity edema ABDOMEN:abdomen soft, non-tender and normal bowel sounds Musculoskeletal:no cyanosis of digits and no clubbing  PSYCH: alert & oriented x 3 with fluent speech NEURO: no focal motor/sensory deficits  LABORATORY DATA:  I have reviewed the data as listed CBC Latest Ref Rng & Units 05/11/2018 04/05/2018 03/23/2018  WBC 4.0 - 10.5  K/uL 3.9(L) 4.6 2.6(L)  Hemoglobin 12.0 - 15.0 g/dL 10.7(L) 10.8(L) 10.3(L)  Hematocrit 36.0 - 46.0 % 33.9(L) 32.5(L) 33.8(L)  Platelets 150 - 400 K/uL 136(L) 177 127(L)    CMP Latest Ref Rng & Units 04/05/2018 03/23/2018 12/17/2017  Glucose 65 - 99 mg/dL 140(H) 414(HH) 121(H)  BUN 8 - 27 mg/dL 11 14 12   Creatinine 0.57 - 1.00 mg/dL 0.64 0.71 0.55(L)  Sodium 134 - 144 mmol/L 139 136 143  Potassium 3.5 - 5.2 mmol/L 4.8 4.1 4.3  Chloride 96 - 106 mmol/L 99 96 103  CO2 20 - 29 mmol/L 23 21 24   Calcium 8.7 - 10.3 mg/dL 10.0 9.7 9.1  Total Protein 6.0 - 8.5 g/dL 6.6 6.6 -  Total Bilirubin 0.0 - 1.2 mg/dL 0.4 0.3 -  Alkaline Phos 39 - 117 IU/L 90 106 -  AST 0 - 40 IU/L 13 10 -  ALT 0 - 32 IU/L 16 13 -    RADIOGRAPHIC STUDIES: I have personally reviewed the radiological images as listed and agreed with the findings in the report. Ct Chest W Contrast  Result Date: 04/23/2018 CLINICAL DATA:  Newly diagnosed esophageal carcinoma. Staging. EXAM: CT CHEST, ABDOMEN, AND PELVIS WITH CONTRAST TECHNIQUE: Multidetector CT imaging of the chest, abdomen and pelvis was performed following the standard protocol during bolus administration of intravenous contrast. CONTRAST:  178mL OMNIPAQUE IOHEXOL 300 MG/ML  SOLN COMPARISON:  Noncontrast chest CT on 11/23/2017; no prior [facility] CT FINDINGS: CT CHEST FINDINGS Cardiovascular: No acute findings. Aortic and coronary artery atherosclerosis. Mediastinum/Lymph Nodes: Prior left thyroidectomy again noted. No masses or pathologically enlarged lymph nodes identified. No abnormal esophageal wall thickening or dilatation seen. No evidence of enlarged paraesophageal lymph nodes. Lungs/Pleura: Stable mild scarring in lingula and right middle lobe. Mild centrilobular emphysema again noted. Surgical clips or coil again seen in the right midlung. No suspicious pulmonary nodules or masses are identified. No evidence of pulmonary infiltrate or pleural effusion. Musculoskeletal:  No  suspicious bone lesions identified. CT ABDOMEN AND PELVIS FINDINGS Hepatobiliary: No masses identified. Gallbladder is unremarkable. Pancreas:  No mass or inflammatory changes. Spleen:  Within normal limits in size and appearance. Adrenals/Urinary tract: No masses or hydronephrosis. Tiny approximately 5 mm nonobstructing calculus in midpole of right kidney. Unremarkable unopacified urinary bladder. Stomach/Bowel: No evidence of obstruction, inflammatory process, or abnormal fluid collections. Diverticulosis is seen mainly involving the descending and sigmoid colon, however there is no evidence of diverticulitis. Vascular/Lymphatic: No pathologically enlarged lymph nodes identified. No abdominal aortic aneurysm. Aortic atherosclerosis. Reproductive: Prior hysterectomy noted. Adnexal regions are unremarkable in appearance. Other:  None. Musculoskeletal:  No suspicious bone lesions identified. IMPRESSION: 1. No evidence of metastatic disease within the chest, abdomen, or pelvis. 2. Colonic diverticulosis, without radiographic evidence of diverticulitis. 3. Tiny nonobstructing right renal calculus. Electronically Signed   By: Earle Gell M.D.   On: 04/23/2018 13:07   Ct Abdomen Pelvis W Contrast  Result Date: 04/23/2018 CLINICAL DATA:  Newly diagnosed esophageal carcinoma. Staging. EXAM: CT CHEST, ABDOMEN, AND PELVIS WITH CONTRAST TECHNIQUE: Multidetector CT imaging of the chest, abdomen and pelvis was performed following the standard protocol during bolus administration  of intravenous contrast. CONTRAST:  138mL OMNIPAQUE IOHEXOL 300 MG/ML  SOLN COMPARISON:  Noncontrast chest CT on 11/23/2017; no prior [facility] CT FINDINGS: CT CHEST FINDINGS Cardiovascular: No acute findings. Aortic and coronary artery atherosclerosis. Mediastinum/Lymph Nodes: Prior left thyroidectomy again noted. No masses or pathologically enlarged lymph nodes identified. No abnormal esophageal wall thickening or dilatation seen. No evidence of enlarged  paraesophageal lymph nodes. Lungs/Pleura: Stable mild scarring in lingula and right middle lobe. Mild centrilobular emphysema again noted. Surgical clips or coil again seen in the right midlung. No suspicious pulmonary nodules or masses are identified. No evidence of pulmonary infiltrate or pleural effusion. Musculoskeletal:  No suspicious bone lesions identified. CT ABDOMEN AND PELVIS FINDINGS Hepatobiliary: No masses identified. Gallbladder is unremarkable. Pancreas:  No mass or inflammatory changes. Spleen:  Within normal limits in size and appearance. Adrenals/Urinary tract: No masses or hydronephrosis. Tiny approximately 5 mm nonobstructing calculus in midpole of right kidney. Unremarkable unopacified urinary bladder. Stomach/Bowel: No evidence of obstruction, inflammatory process, or abnormal fluid collections. Diverticulosis is seen mainly involving the descending and sigmoid colon, however there is no evidence of diverticulitis. Vascular/Lymphatic: No pathologically enlarged lymph nodes identified. No abdominal aortic aneurysm. Aortic atherosclerosis. Reproductive: Prior hysterectomy noted. Adnexal regions are unremarkable in appearance. Other:  None. Musculoskeletal:  No suspicious bone lesions identified. IMPRESSION: 1. No evidence of metastatic disease within the chest, abdomen, or pelvis. 2. Colonic diverticulosis, without radiographic evidence of diverticulitis. 3. Tiny nonobstructing right renal calculus. Electronically Signed   By: Earle Gell M.D.   On: 04/23/2018 13:07    ASSESSMENT & PLAN:  Erica Braun is a 68 y.o. caucasian female with a history of COPD, GERD, diverticulosis, hypertension, hyperlipidemia, presented with odynophagia for 18 months   1. Esophogeal Cancer, squamous cell carcinoma, cT1bN0M0 stage I -I discussed her EGD, EUS, and CT image findings and pathology reports with patient in great detail. Her biopsy showed squamous cell carcinoma. This is the second most common  histology type which is related to her history of smoking.  -Based on EUS this is very early stage with no evidence of node or distant metastasis on CT scan. Based on EUS her tumor is most contained in mucosa with some area in submucosa, maybe amenable for endoscopy ablation. If not feasible, she would need surgery, esophagectomy, for complete resection.  -CT scan shows no Lymph node involvement, but I discussed a PET scan is better to rule out LN involvement. I recommend a PET scan, she is agreeable.  -She will be seen at [facility] for consultation to determine if she is a candidate for endoscopically ablation. If she is not a candidate she will be seen by Dr. Servando Snare for Esophagectomy. If LNs are positive on PET, which main she has locally advanced disease then the standard therapy is neoadjuvant chemotherapy and radiation before surgery. She understands.  -Her physical exam today was unremarkable.  -Will do baseline labs today  -F/u open, I will call her after her PET scan    2. Mild Odynophagia and Dysphagia  -Has improved lately. She still has mild odynophagia but no pain. She still has dysphagia with soft and solid foods, but with room temperature water she gets relief.   -She is currently able to eat most foods except spicy or acidic foods/liquids. She has been able to maintain her weight overall   3. Acid reflux -She has a h/o gastritis and has had her esophagus stretched.  -She is on Prilosec for her acid reflux. Continue  4. Anemia  -Present since early 2018 per patient and has been stable overtime   -She had colonoscopy in 03/2015 which showed diverticulosis. I dicussed this can cause bleeding leading to iron deficiencies. Her esophogeal cancer can also cause bleeding. I also discussed her comorbidities can cause this anemia.  -I will do baseline labs and iron panel to further evaluate cause. She is agreeable.  -I encouraged her to start oral iron daily.    5. COPD  -secondary to h/o  of smoking fro 35-40 years, she quit around 2010.  -Has had h/o respiratory failure and required 6 months oxygen.  -She uses albuterol as needed -stable   6. CAD  -She had 100% blockage of coronary artery, s/p cardiac cath  -treated with oral Lipitor and Imdur. On losartan and lopressor for borderline HTN -Continue to f/u with cardiologist.   7. DM  -On glimepiride, metformin -Continue to monitor at home and with PCP    PLAN:  -Lab today for anemia workup  -PET scan at [facility] ASAP  -F/U open, I will call her after PET    Orders Placed This Encounter  Procedures   NM PET Image Initial (PI) Skull Base To Thigh    Standing Status:   Future    Standing Expiration Date:   05/11/2019    Order Specific Question:   If indicated for the ordered procedure, I authorize the administration of a radiopharmaceutical per Radiology protocol    Answer:   Yes    Order Specific Question:   Preferred imaging location?    Answer:   [facility]    Order Specific Question:   Radiology Contrast Protocol - do NOT remove file path    Answer:   [email or web address]   CBC with Differential ([facility] Only)    Standing Status:   Future    Number of Occurrences:   1    Standing Expiration Date:   05/11/2019   Retic Panel    Standing Status:   Future    Number of Occurrences:   1    Standing Expiration Date:   05/11/2019   Ferritin    Standing Status:   Future    Number of Occurrences:   1    Standing Expiration Date:   05/11/2019   Iron and TIBC    Standing Status:   Future    Number of Occurrences:   1    Standing Expiration Date:   05/11/2019   Folate RBC    Standing Status:   Future    Number of Occurrences:   1    Standing Expiration Date:   05/11/2019   Vitamin B12    Standing Status:   Future    Number of Occurrences:   1    Standing Expiration Date:   05/11/2019    All questions were answered. The patient knows to call the clinic with any problems, questions  or concerns. I spent 50 minutes counseling the patient face to face. The total time spent in the appointment was 60 minutes and more than 50% was on counseling.     Truitt Merle, MD 05/11/2018 2:14 PM  I, Joslyn Devon, am acting as scribe for Truitt Merle, MD.   I have reviewed the above documentation for accuracy and completeness, and I agree with the above.

## 2018-05-11 ENCOUNTER — Encounter: Payer: Self-pay | Admitting: Hematology

## 2018-05-11 ENCOUNTER — Inpatient Hospital Stay: Payer: Medicare HMO | Attending: Hematology | Admitting: Hematology

## 2018-05-11 ENCOUNTER — Inpatient Hospital Stay: Payer: Medicare HMO

## 2018-05-11 ENCOUNTER — Other Ambulatory Visit: Payer: Self-pay

## 2018-05-11 VITALS — BP 137/66 | HR 90 | Temp 97.4°F | Resp 19 | Ht 62.0 in | Wt 158.8 lb

## 2018-05-11 DIAGNOSIS — K219 Gastro-esophageal reflux disease without esophagitis: Secondary | ICD-10-CM | POA: Diagnosis not present

## 2018-05-11 DIAGNOSIS — I10 Essential (primary) hypertension: Secondary | ICD-10-CM | POA: Diagnosis not present

## 2018-05-11 DIAGNOSIS — E119 Type 2 diabetes mellitus without complications: Secondary | ICD-10-CM | POA: Insufficient documentation

## 2018-05-11 DIAGNOSIS — D649 Anemia, unspecified: Secondary | ICD-10-CM

## 2018-05-11 DIAGNOSIS — I700 Atherosclerosis of aorta: Secondary | ICD-10-CM | POA: Diagnosis not present

## 2018-05-11 DIAGNOSIS — I2510 Atherosclerotic heart disease of native coronary artery without angina pectoris: Secondary | ICD-10-CM | POA: Diagnosis not present

## 2018-05-11 DIAGNOSIS — J449 Chronic obstructive pulmonary disease, unspecified: Secondary | ICD-10-CM | POA: Diagnosis not present

## 2018-05-11 DIAGNOSIS — K2210 Ulcer of esophagus without bleeding: Secondary | ICD-10-CM | POA: Diagnosis not present

## 2018-05-11 DIAGNOSIS — H409 Unspecified glaucoma: Secondary | ICD-10-CM | POA: Diagnosis not present

## 2018-05-11 DIAGNOSIS — C159 Malignant neoplasm of esophagus, unspecified: Secondary | ICD-10-CM | POA: Diagnosis not present

## 2018-05-11 DIAGNOSIS — Z87891 Personal history of nicotine dependence: Secondary | ICD-10-CM | POA: Insufficient documentation

## 2018-05-11 DIAGNOSIS — Z7982 Long term (current) use of aspirin: Secondary | ICD-10-CM | POA: Insufficient documentation

## 2018-05-11 DIAGNOSIS — E785 Hyperlipidemia, unspecified: Secondary | ICD-10-CM | POA: Diagnosis not present

## 2018-05-11 DIAGNOSIS — Z79899 Other long term (current) drug therapy: Secondary | ICD-10-CM

## 2018-05-11 DIAGNOSIS — G4730 Sleep apnea, unspecified: Secondary | ICD-10-CM | POA: Diagnosis not present

## 2018-05-11 LAB — CBC WITH DIFFERENTIAL (CANCER CENTER ONLY)
Abs Immature Granulocytes: 0.01 10*3/uL (ref 0.00–0.07)
Basophils Absolute: 0 10*3/uL (ref 0.0–0.1)
Basophils Relative: 1 %
Eosinophils Absolute: 0.1 10*3/uL (ref 0.0–0.5)
Eosinophils Relative: 2 %
HCT: 33.9 % — ABNORMAL LOW (ref 36.0–46.0)
Hemoglobin: 10.7 g/dL — ABNORMAL LOW (ref 12.0–15.0)
Immature Granulocytes: 0 %
Lymphocytes Relative: 22 %
Lymphs Abs: 0.9 10*3/uL (ref 0.7–4.0)
MCH: 28.2 pg (ref 26.0–34.0)
MCHC: 31.6 g/dL (ref 30.0–36.0)
MCV: 89.4 fL (ref 80.0–100.0)
Monocytes Absolute: 0.5 10*3/uL (ref 0.1–1.0)
Monocytes Relative: 13 %
Neutro Abs: 2.4 10*3/uL (ref 1.7–7.7)
Neutrophils Relative %: 62 %
Platelet Count: 136 10*3/uL — ABNORMAL LOW (ref 150–400)
RBC: 3.79 MIL/uL — ABNORMAL LOW (ref 3.87–5.11)
RDW: 15.2 % (ref 11.5–15.5)
WBC Count: 3.9 10*3/uL — ABNORMAL LOW (ref 4.0–10.5)
nRBC: 0 % (ref 0.0–0.2)

## 2018-05-11 LAB — RETIC PANEL
Immature Retic Fract: 15.7 % (ref 2.3–15.9)
RBC.: 3.79 MIL/uL — ABNORMAL LOW (ref 3.87–5.11)
Retic Count, Absolute: 108.8 10*3/uL (ref 19.0–186.0)
Retic Ct Pct: 2.9 % (ref 0.4–3.1)
Reticulocyte Hemoglobin: 32.3 pg (ref >27.9)

## 2018-05-11 LAB — IRON AND TIBC
Iron: 54 ug/dL (ref 41–142)
Saturation Ratios: 16 % — ABNORMAL LOW (ref 21–57)
TIBC: 346 ug/dL (ref 236–444)
UIBC: 292 ug/dL (ref 120–384)

## 2018-05-11 LAB — VITAMIN B12: Vitamin B-12: 141 pg/mL — ABNORMAL LOW (ref 180–914)

## 2018-05-11 LAB — FERRITIN: Ferritin: 14 ng/mL (ref 11–307)

## 2018-05-12 ENCOUNTER — Telehealth: Payer: Self-pay

## 2018-05-12 ENCOUNTER — Telehealth: Payer: Self-pay | Admitting: Hematology

## 2018-05-12 LAB — FOLATE RBC
Folate, Hemolysate: 437 ng/mL
Folate, RBC: 1324 ng/mL (ref >498)
Hematocrit: 33 % — ABNORMAL LOW (ref 34.0–46.6)

## 2018-05-12 NOTE — Telephone Encounter (Signed)
Per 5/5 los PET scan at [facility] ASAP  F/U open .

## 2018-05-12 NOTE — Telephone Encounter (Signed)
Called patient and made her aware of Dr. Ernestina Penna instructions below. Patient verbalized understanding.

## 2018-05-12 NOTE — Telephone Encounter (Signed)
-----   Message from Jonnie Finner, RN sent at 05/12/2018 11:49 AM EDT -----  ----- Message ----- From: Truitt Merle, MD Sent: 05/11/2018   9:23 PM EDT To: Jonnie Finner, RN  Please let pt know the lab results, she has mild anemia, low iron level and very low B12 level, which are likely the cause of her anemia. I recommend her to start oral iron, ferrous sulfate 65mg  twice daily if she can tolerate, and oral B12 1070mcg daily. Plan to repeat lab in 2-3 months, if iron and B12 still low, will consider iv iron and B12 injection. She does not have f/u with [place], depends on her esophageal cancer treatment plan, I will call her to set up future appointments after her PET scan. Thanks   Truitt Merle  05/11/2018

## 2018-05-12 NOTE — Telephone Encounter (Signed)
Left message for [facility] at [facility] with information to get this patient scheduled for PET scan asap.  My direct call back number was provided.

## 2018-05-17 ENCOUNTER — Encounter (HOSPITAL_COMMUNITY)
Admission: RE | Admit: 2018-05-17 | Discharge: 2018-05-17 | Disposition: A | Discharge status: Home / Self Care | Payer: Medicare HMO | Source: Ambulatory Visit | Attending: Hematology | Admitting: Hematology

## 2018-05-17 ENCOUNTER — Other Ambulatory Visit: Payer: Self-pay

## 2018-05-17 DIAGNOSIS — C159 Malignant neoplasm of esophagus, unspecified: Secondary | ICD-10-CM | POA: Insufficient documentation

## 2018-05-17 MED ORDER — FLUDEOXYGLUCOSE F - 18 (FDG) INJECTION
8.8000 | Freq: Once | INTRAVENOUS | Status: AC | PRN
  Administered 2018-05-17: 8.8 via INTRAVENOUS

## 2018-05-18 ENCOUNTER — Telehealth: Payer: Self-pay | Admitting: *Deleted

## 2018-05-18 NOTE — Telephone Encounter (Signed)
-----   Message from Truitt Merle, MD sent at 05/18/2018 11:06 AM EDT ----- Meredeth Ide, please call pt and let her know the PET scan results, no evidence of node or distant metastasis from her esophageal cancer, please check if she has appointment with GI at [facility]. She does not have f/u with [place], let her call us if she has any issues about her cancer treatment. Thanks.  Truitt Merle  05/18/2018

## 2018-05-18 NOTE — Telephone Encounter (Signed)
Spoke with pt and informed her of PET scan results as per Dr. Burr Medico.  Per pt, Dr. Rush Landmark is waiting for PET results.  Instructed pt to call GI office to obtain appt since PET scan results are available now.  Pt voiced understanding.

## 2018-05-19 NOTE — Telephone Encounter (Signed)
Please let patient know that I received her email I reviewed the PET scan and it shows only local hypermetabolism in the esophagus where we know the squamous neoplasia is located [place] news that there is no distal spread We need to decide, with Dr. Donneta Romberg help, whether this lesion can be treated with ESD at [facility]. I will ask for his input and we can let the patient know  Please let her know we will be back in touch soon

## 2018-05-21 ENCOUNTER — Telehealth: Payer: Self-pay

## 2018-05-21 NOTE — Telephone Encounter (Signed)
Referral/records faxed to Dr. Leroy Sea for ESD. Records faxed to [phone].

## 2018-05-25 NOTE — Telephone Encounter (Signed)
06/09/2018 should be okay for her appointment, I know she wishes it could be sooner I do not think she is at higher risk waiting until this appt date and time

## 2018-05-26 DIAGNOSIS — G4733 Obstructive sleep apnea (adult) (pediatric): Secondary | ICD-10-CM | POA: Diagnosis not present

## 2018-06-09 DIAGNOSIS — C159 Malignant neoplasm of esophagus, unspecified: Secondary | ICD-10-CM | POA: Diagnosis not present

## 2018-06-09 DIAGNOSIS — G4733 Obstructive sleep apnea (adult) (pediatric): Secondary | ICD-10-CM | POA: Diagnosis not present

## 2018-06-14 ENCOUNTER — Other Ambulatory Visit (HOSPITAL_COMMUNITY): Payer: Medicare HMO

## 2018-07-07 DIAGNOSIS — Z01812 Encounter for preprocedural laboratory examination: Secondary | ICD-10-CM | POA: Diagnosis not present

## 2018-07-07 DIAGNOSIS — Z1159 Encounter for screening for other viral diseases: Secondary | ICD-10-CM | POA: Diagnosis not present

## 2018-07-08 DIAGNOSIS — Z87891 Personal history of nicotine dependence: Secondary | ICD-10-CM | POA: Diagnosis not present

## 2018-07-08 DIAGNOSIS — Z7984 Long term (current) use of oral hypoglycemic drugs: Secondary | ICD-10-CM | POA: Diagnosis not present

## 2018-07-08 DIAGNOSIS — C154 Malignant neoplasm of middle third of esophagus: Secondary | ICD-10-CM | POA: Diagnosis not present

## 2018-07-08 DIAGNOSIS — I10 Essential (primary) hypertension: Secondary | ICD-10-CM | POA: Diagnosis not present

## 2018-07-08 DIAGNOSIS — K219 Gastro-esophageal reflux disease without esophagitis: Secondary | ICD-10-CM | POA: Diagnosis not present

## 2018-07-08 DIAGNOSIS — Z85828 Personal history of other malignant neoplasm of skin: Secondary | ICD-10-CM | POA: Diagnosis not present

## 2018-07-08 DIAGNOSIS — G4730 Sleep apnea, unspecified: Secondary | ICD-10-CM | POA: Diagnosis not present

## 2018-07-08 DIAGNOSIS — E119 Type 2 diabetes mellitus without complications: Secondary | ICD-10-CM | POA: Diagnosis not present

## 2018-07-08 DIAGNOSIS — I2510 Atherosclerotic heart disease of native coronary artery without angina pectoris: Secondary | ICD-10-CM | POA: Diagnosis not present

## 2018-07-08 DIAGNOSIS — Z9989 Dependence on other enabling machines and devices: Secondary | ICD-10-CM | POA: Diagnosis not present

## 2018-07-08 DIAGNOSIS — C159 Malignant neoplasm of esophagus, unspecified: Secondary | ICD-10-CM | POA: Diagnosis not present

## 2018-07-08 DIAGNOSIS — J449 Chronic obstructive pulmonary disease, unspecified: Secondary | ICD-10-CM | POA: Diagnosis not present

## 2018-07-09 DIAGNOSIS — G4733 Obstructive sleep apnea (adult) (pediatric): Secondary | ICD-10-CM | POA: Diagnosis not present

## 2018-07-10 DIAGNOSIS — R69 Illness, unspecified: Secondary | ICD-10-CM | POA: Diagnosis not present

## 2018-07-14 DIAGNOSIS — C159 Malignant neoplasm of esophagus, unspecified: Secondary | ICD-10-CM | POA: Diagnosis not present

## 2018-07-20 ENCOUNTER — Telehealth: Payer: Self-pay | Admitting: Internal Medicine

## 2018-07-20 NOTE — Telephone Encounter (Signed)
Thanks Ulice Dash for letting her know. Erica Braun, could you get this patient on for 7/22 discussion if Dr. Burr Medico is also going to be present. Thanks. GM

## 2018-07-20 NOTE — Telephone Encounter (Signed)
I spoke to the patient by phone this evening.  She had spoken with Dr. Leroy Sea at [facility] regarding her pathology results.  His thoughts are summarized below:  "In short, while I have an R0 resection here, because it is moderately differentiated and invades the muscularis mucosa (M3), I cannot say she has a cure.  Her risk of lymph node metastasis according to ESGE guidelines on ESD are anywhere from 8-25%.  Bear in mind that is largely based on one study of 30 patients where the risk of LNM in m3 disease (invading MM) was 2 of 8 patients (25%).  So numbers are not great to define this.  Positive signs are the lack of concerning nodes on imaging and EUS and lack of LVI on path, but also concerning would be the overall size of her tumor. "  He has recommended that we have her see Dr. Burr Medico again, whom she has seen before.  I also think it best for her to be discussed at the multidisciplinary cancer conference and then be seen thereafter.  We will plan for discussion in this conference on 07/28/2018.  The patient knows that she will be contacted thereafter.  She thanked me for the call

## 2018-07-21 ENCOUNTER — Other Ambulatory Visit: Payer: Self-pay | Admitting: Hematology

## 2018-07-21 NOTE — Telephone Encounter (Signed)
I will be at the GI conference next week, will try to get an opinion from Dr. Servando Snare also.   Truitt Merle MD

## 2018-07-22 ENCOUNTER — Telehealth: Payer: Self-pay | Admitting: Hematology

## 2018-07-22 NOTE — Telephone Encounter (Signed)
Called patient and scheduled appt per 7/15 sch message.  Spoke with patient spouse and he will inform the patient of the appt date and time and that it is a virtual visit only (doximity).

## 2018-07-23 ENCOUNTER — Telehealth: Payer: Self-pay | Admitting: Family Medicine

## 2018-07-23 NOTE — Telephone Encounter (Signed)
She will check with pharmacist to see if she can crush pill.  She has taken the 500 mg pill and it is not much smaller.

## 2018-07-23 NOTE — Progress Notes (Signed)
[place]   Telephone:([phone]) [phone] [phone]) [phone]   Clinic Follow up Note   Patient Care Team: Baruch Gouty, [name] as PCP - General (Family Medicine) Herminio Commons, MD as PCP - Cardiology (Cardiology)   I connected with [name] on 07/28/2018 at  4:00 PM EDT by video enabled telemedicine visit and verified that I am speaking with the correct person using two identifiers.  I discussed the limitations, risks, security and privacy concerns of performing an evaluation and management service by telephone and the availability of in person appointments. I also discussed with the patient that there may be a patient responsible charge related to this service. The patient expressed understanding and agreed to proceed.   Patient's location:  Her home  Provider's location:  My Office   CHIEF COMPLAINT: F/u of Esophogeal Cancer  SUMMARY OF ONCOLOGIC HISTORY: Oncology History Overview Note  Cancer Staging Squamous cell esophageal cancer ([place]) Staging form: Esophagus - Squamous Cell Carcinoma, AJCC 8th Edition - Clinical stage from 04/08/2018: Stage I (cT1b, cN0, cM0) - Signed by Truitt Merle, MD on 05/10/2018     Squamous cell esophageal cancer ([place])  04/08/2018 Cancer Staging   Staging form: Esophagus - Squamous Cell Carcinoma, AJCC 8th Edition - Clinical stage from 04/08/2018: Stage I (cT1b, cN0, cM0) - Signed by Truitt Merle, MD on 05/10/2018   04/08/2018 Procedure   Upper Endoscopy Dr. Hilarie Fredrickson 04/08/18  IMPRESSION - Moderately severe erosive esophagitis. Biopsied. - Z-line regular, 35 cm from the incisors. - Normal stomach. - Normal examined duodenum.     04/08/2018 Initial Biopsy   Diagnosis 04/08/18 Surgical [P], mid esophagus - HIGH GRADE SQUAMOUS DYSPLASIA/SQUAMOUS CELL CARCINOMA. - SEE COMMENT.   04/23/2018 Imaging   CT CAP 04/23/18 IMPRESSION: 1. No evidence of metastatic disease within the chest, abdomen, or pelvis. 2. Colonic diverticulosis, without  radiographic evidence of diverticulitis. 3. Tiny nonobstructing right renal calculus.   05/03/2018 Procedure   EGD and EUS by Dr. Rush Landmark 05/03/18 IMPRESSION EGD Impression: - No gross lesions in esophagus. - Discolored, granular, nodular, texture changed, increased vascular pattern, ulcerated mucosa in the esophagus. Previously biopsied with HGD and most likely SCC. Did not rebiopsy as this could make ESD more difficult if it were to be considered. The lesion is partially circumferential but has at lest another 3 cm of lesion that can be visualized with white-light and NBI. Lugol's stain was not available for use today. - Z-line irregular, 39 cm from the incisors. - A small amount of food (residue) in the stomach. - Gastritis. Non-bleeding erosive gastropathy. Biopsied for HP. - No gross lesions in the duodenal bulb, in the first portion of the duodenum and in the second portion of the duodenum.  EUS Impression: - Wall thickening was seen in the thoracic esophagus from 25-30 cm was noted. The lesion however extends on EGD to at least 33 cm. The thickening appeared to primarily be within the luminal interface/superficial mucosa (Layer 1) and deep mucosa (Layer 2). However at approximately 27-28 cm, there was some loss of the submucosa. At no point during the EUS was visualization of the muscularis propria felt to be involved. This would be staged at least T1a but more likely T1b due to my inability to visualize one area of the submucosa in the middle of the circumferential lesion. From a nodal perspective, the nodes were small as noted below and also adjacent to the lesion. - There was no sign of significant pathology in the cervical esophagus  and in the gastroesophageal junction. - Two benign lymph nodes were visualized in the middle paraesophageal mediastinum (level 95M). Tissue has not been obtained. However, the endosonographic appearance is suggestive of benign nature. They were  not sampled. - No malignant-appearing lymph nodes were visualized in the paracardial region (level 16), left gastric region (level 17), gastrohepatic ligament (level 18) and celiac region (level 20).  Diagnosis Stomach, biopsy - GASTRIC ANTRAL MUCOSA WITH MILD REACTIVE GASTROPATHY. - GASTRIC OXYNTIC MUCOSA WITH MILD CHRONIC GASTRITIS. - NEGATIVE FOR HELICOBACTER PYLORI.    05/10/2018 Initial Diagnosis   Squamous cell esophageal cancer ([place])   05/17/2018 PET scan   PET 05/17/18 IMPRESSION: 1. Mid esophageal hypermetabolism, presumably representing the primary lesion. 2. No findings of hypermetabolic metastatic disease. 3. Aortic atherosclerosis (ICD10-I70.0), coronary artery atherosclerosis and emphysema (ICD10-J43.9). 4. Right nephrolithiasis    07/08/2018 Procedure   EGD at [facility] 07/08/18  IMPRESSION -known squamous cell carcioma was ofund in the middle of third of the esophogus. Complete removal was accomplished with ESD. Single clip palced.  -Nomral stomach  -Nomral examined duoenum -Endocopic submucosal dissection was performed, Resectino and retreval were complete    07/08/2018 Pathology Results   ENDOSCOPIC SUBMUCOSAL DISSECTION OF ESOPHAGUS Pathology 07/08/18 A.  Esophagus, endoscopic submucosal dissection:  Multiple microscopic foci of moderately differentiated squamous cell carcinoma invading the muscularis mucosae in a background of extensive squamous cell carcinoma in situ.   Comment:  The largest focus of invasion measures approximately 3 mm in greatest dimension, and very closely approaches the deep margin at a distance of 0.074 mm.  The invasive tumor ends approximately at the junction between muscularis mucosae and submucosa; given that the margin is so close here, it is difficult to exclude if un-sampled submucosal invasion is present.      CURRENT THERAPY:  S/p endoscopic resection 07/08/2018  INTERVAL HISTORY:  Krysta Bloomfield is here for a follow up. They  identified themselves by face to face video. She notes her procedure to remove her tumor was completed. She was frenchmen to return for follow up care. She notes dysphagia. She did have blood with emesis once. She will see them again tomorrow.    REVIEW OF SYSTEMS:   Constitutional: Denies fevers, chills or abnormal weight loss Eyes: Denies blurriness of vision Ears, nose, mouth, throat, and face: Denies mucositis or sore throat (+) Dysphagia  Respiratory: Denies cough, dyspnea or wheezes Cardiovascular: Denies palpitation, chest discomfort or lower extremity swelling Gastrointestinal:  Denies nausea, heartburn or change in bowel habits Skin: Denies abnormal skin rashes Lymphatics: Denies new lymphadenopathy or easy bruising Neurological:Denies numbness, tingling or new weaknesses Behavioral/Psych: Mood is stable, no new changes  All other systems were reviewed with the patient and are negative.  MEDICAL HISTORY:  Past Medical History:  Diagnosis Date  . Complication of anesthesia   . COPD (chronic obstructive pulmonary disease) ([place])   . Coronary artery disease   . Diabetes mellitus without complication ([place])   . Diverticulosis   . Early cataracts, bilateral 11/21/2016  . Emphysema of lung ([place])   . GERD (gastroesophageal reflux disease)   . Glaucoma   . Hyperlipidemia   . Hypertension   . Internal hemorrhoids   . Pneumonia   . PONV (postoperative nausea and vomiting)   . Sleep apnea    uses Cpap  . Spasm of the cricopharyngeus muscle   . Squamous cell esophageal cancer ([place]) 05/10/2018    SURGICAL HISTORY: Past Surgical History:  Procedure Laterality Date  . ABDOMINAL HYSTERECTOMY  cervical dysplasia  . BIOPSY  05/03/2018   Procedure: BIOPSY;  Surgeon: Rush Landmark Telford Nab., MD;  Location: [place];  Service: Gastroenterology;;  . BREAST BIOPSY Left   . BREAST LUMPECTOMY Left   . ESOPHAGOGASTRODUODENOSCOPY (EGD) WITH PROPOFOL N/A 05/03/2018   Procedure:  ESOPHAGOGASTRODUODENOSCOPY (EGD) WITH PROPOFOL;  Surgeon: Rush Landmark Telford Nab., MD;  Location: [place];  Service: Gastroenterology;  Laterality: N/A;  . EUS  05/03/2018   Procedure: UPPER ENDOSCOPIC ULTRASOUND (EUS) RADIAL;  Surgeon: Rush Landmark Telford Nab., MD;  Location: [place];  Service: Gastroenterology;;  . LEFT HEART CATH AND CORONARY ANGIOGRAPHY N/A 06/11/2017   Procedure: LEFT HEART CATH AND CORONARY ANGIOGRAPHY;  Surgeon: Belva Crome, MD;  Location: [place] [facility];  Service: Cardiovascular;  Laterality: N/A;    I have reviewed the social history and family history with the patient and they are unchanged from previous note.  ALLERGIES:  has No Known Allergies.  MEDICATIONS:  Current Outpatient Medications  Medication Sig Dispense Refill  . albuterol (PROVENTIL) (2.5 MG/3ML) 0.083% nebulizer solution Take 3 mLs (2.5 mg total) every 6 (six) hours as needed by nebulization for wheezing or shortness of breath. 75 mL 3  . ANORO ELLIPTA 62.5-25 MCG/INH AEPB Inhale 1 puff into the lungs daily.     Marland Kitchen aspirin EC 81 MG tablet Take 81 mg by mouth daily.    Marland Kitchen atorvastatin (LIPITOR) 40 MG tablet Take 1 tablet (40 mg total) by mouth daily. (Patient taking differently: Take 40 mg by mouth every evening. ) 90 tablet 3  . Blood Glucose Monitor Software DEVI Test BS BID and PRN Dx E11.9 1 Device 1  . CALCIUM PO Take 1 tablet by mouth daily.    . Cholecalciferol (VITAMIN D3) 2000 units TABS Take 4,000 Units by mouth daily.    Marland Kitchen glimepiride (AMARYL) 2 MG tablet Take 1 tablet (2 mg total) by mouth daily with breakfast. 90 tablet 1  . glucose blood test strip test BS BID Dx E11.9 200 each 3  . isosorbide mononitrate (IMDUR) 30 MG 24 hr tablet Take 1 tablet (30 mg total) by mouth 2 (two) times daily. (Patient taking differently: Take 30 mg by mouth daily. ) 180 tablet 1  . Lancets (ONETOUCH ULTRASOFT) lancets Test BS BID Dx E11.9 200 each 3  . losartan (COZAAR) 25 MG tablet Take 1 tablet  (25 mg total) by mouth daily. 90 tablet 3  . metFORMIN (GLUCOPHAGE) 1000 MG tablet Take 1 tablet (1,000 mg total) by mouth 2 (two) times daily with a meal. 180 tablet 3  . metoprolol tartrate (LOPRESSOR) 50 MG tablet Take 1 tablet by mouth twice daily (Patient taking differently: Take 50 mg by mouth 2 (two) times daily. ) 180 tablet 0  . Multiple Vitamins-Minerals (MULTIVITAMIN PO) Take 1 tablet by mouth daily.    . nitroGLYCERIN (NITROSTAT) 0.4 MG SL tablet Place 1 tablet (0.4 mg total) under the tongue every 5 (five) minutes as needed for chest pain. 25 tablet 3  . omeprazole (PRILOSEC) 40 MG capsule Take 1 capsule (40 mg total) by mouth 2 (two) times daily before a meal. 60 capsule 3   No current facility-administered medications for this visit.     PHYSICAL EXAMINATION: ECOG PERFORMANCE STATUS: 1 - Symptomatic but completely ambulatory  No vitals taken today, Exam not performed today   LABORATORY DATA:  I have reviewed the data as listed CBC Latest Ref Rng & Units 05/11/2018 05/11/2018 04/05/2018  WBC 4.0 - 10.5 K/uL 3.9(L) -  4.6  Hemoglobin 12.0 - 15.0 g/dL 10.7(L) - 10.8(L)  Hematocrit 34.0 - 46.6 % 33.9(L) 33.0(L) 32.5(L)  Platelets 150 - 400 K/uL 136(L) - 177     CMP Latest Ref Rng & Units 04/05/2018 03/23/2018 12/17/2017  Glucose 65 - 99 mg/dL 140(H) 414(HH) 121(H)  BUN 8 - 27 mg/dL 11 14 12   Creatinine 0.57 - 1.00 mg/dL 0.64 0.71 0.55(L)  Sodium 134 - 144 mmol/L 139 136 143  Potassium 3.5 - 5.2 mmol/L 4.8 4.1 4.3  Chloride 96 - 106 mmol/L 99 96 103  CO2 20 - 29 mmol/L 23 21 24   Calcium 8.7 - 10.3 mg/dL 10.0 9.7 9.1  Total Protein 6.0 - 8.5 g/dL 6.6 6.6 -  Total Bilirubin 0.0 - 1.2 mg/dL 0.4 0.3 -  Alkaline Phos 39 - 117 IU/L 90 106 -  AST 0 - 40 IU/L 13 10 -  ALT 0 - 32 IU/L 16 13 -      RADIOGRAPHIC STUDIES: I have personally reviewed the radiological images as listed and agreed with the findings in the report. No results found.   ASSESSMENT & PLAN:  Erica Braun is a 68 y.o. female with    1. Esophogeal Cancer, squamous cell carcinoma, pT1acN0M0 stage I -She was diagnosed in 04/2018. Her biopsy showed squamous cell carcinoma in mid esophagus -Based on EUS this is very early stage with no evidence of node or distant metastasis on staging CT scan or PET.  -She underwent endoscopic resection on 07/08/18 at [facility] by Dr. Leroy Sea. We discussed her pathology report mutifocal moderately differentiated SCC, with very close margin. Her tumor did invade into mucularis mucosa (M3), so she has 8-25% risk of lymph node metastasis. Although she is likely cured, there is certainly small possibility of residual unresected tumor cells or even node metastasis.   -We discussed her case at tumor board, and we recommend surgical resection, or very close endoscopy surveillance with surveillance CT scans.  -There is no role for chemotherapy or radiation for her early stage esophageal cancer  -I discussed her about the above options, she is reluctant to consider surgery concern of her quality of life after ectomy.  After lengthy discussion, she agrees to see vascular surgeon Dr. Servando Snare.  Referral was made today. -If she decides not to have to me, she agrees to continue endoscopy and CT surveillance -I discussed if she is found to have disease progression in future, she may no longer be eligible for surgery as a treatment option. She voiced good understanding.  -she will call me if she decides to do surveillance   2. Mild Odynophagia and Dysphagia  -worse after her endoscopic resection of her cancer. -Has improved lately. She still has mild odynophagia but no pain. She still has dysphagia with soft and solid foods, but with room temperature water she gets relief.   -She is currently able to eat most foods except spicy or acidic foods/liquids. She has been able to maintain her weight overall  -Stable post tumor resection. I discussed this is likely due to inflammation from  procedure and should improve with healing.   3. Acid reflux -She has a h/o gastritis and has had her esophagus stretched.  -She is on Prilosec for her acid reflux. Continue    4. Anemia  -Present since early 2018 per patient and has been stable overtime   -She had colonoscopy in 03/2015 which showed diverticulosis. I dicussed this can cause bleeding leading to iron deficiencies. Her esophogeal cancer can  also cause bleeding. I also discussed her comorbidities can cause this anemia.  -Baseline labs show she has mild anemia, low iron level and very low B12 level, which are likely the cause of her anemia.  -continue oral iron, ferrous sulfate and oral B12 1052mcg daily. if iron and B12 still low in future, will consider iv iron and B12 injection.   5. COPD  -secondary to h/o of smoking for 35-40 years, she quit around 2010.  -Has had h/o respiratory failure and required 6 months oxygen.  -She uses albuterol as needed -stable   6. CAD  -She had 100% blockage of coronary artery, s/p cardiac cath  -treated with oral Lipitor and Imdur. On losartan and lopressor for borderline HTN -Continue to f/u with cardiologist.   7. DM  -On glimepiride, metformin -Continue to monitor at home and with PCP    PLAN:  -I reviewed pathology report with patient  -I will refer her to Dr. Servando Snare -F/u open, she will call me if she declines surgery and decides to have surveillance    No problem-specific Assessment & Plan notes found for this encounter.   Orders Placed This Encounter  Procedures  . Ambulatory referral to Cardiothoracic Surgery    Referral Priority:   Routine    Referral Type:   Surgical    Referral Reason:   Specialty Services Required    Requested Specialty:   Cardiothoracic Surgery    Number of Visits Requested:   1   I discussed the assessment and treatment plan with the patient. The patient was provided an opportunity to ask questions and all were answered. The patient  agreed with the plan and demonstrated an understanding of the instructions.  The patient was advised to call back or seek an in-person evaluation if the symptoms worsen or if the condition fails to improve as anticipated.   I provided 15 minutes of face-to-face video visit time during this encounter, and > 50% was spent counseling as documented under my assessment & plan.    Truitt Merle, MD 07/28/2018   I, Joslyn Devon, am acting as scribe for Truitt Merle, MD.   I have reviewed the above documentation for accuracy and completeness, and I agree with the above.

## 2018-07-23 NOTE — Telephone Encounter (Signed)
The patient is currently prescribed metformin 1000 mg which would be larger.  Has she taken the 500mg  dose in the past?  And it should be smaller.  So that is an option for [place] to switch it to the smaller 500 mg dose

## 2018-07-23 NOTE — Telephone Encounter (Signed)
Can she crush the metformin pill? It is so large she can not bare to swallow.  She has had throat surgery for cancer.  Please advise.

## 2018-07-26 DIAGNOSIS — Z01812 Encounter for preprocedural laboratory examination: Secondary | ICD-10-CM | POA: Diagnosis not present

## 2018-07-26 DIAGNOSIS — Z1159 Encounter for screening for other viral diseases: Secondary | ICD-10-CM | POA: Diagnosis not present

## 2018-07-28 ENCOUNTER — Encounter: Payer: Self-pay | Admitting: Hematology

## 2018-07-28 ENCOUNTER — Inpatient Hospital Stay: Payer: Medicare HMO | Attending: Hematology | Admitting: Hematology

## 2018-07-28 DIAGNOSIS — Z79899 Other long term (current) drug therapy: Secondary | ICD-10-CM | POA: Diagnosis not present

## 2018-07-28 DIAGNOSIS — K219 Gastro-esophageal reflux disease without esophagitis: Secondary | ICD-10-CM

## 2018-07-28 DIAGNOSIS — E119 Type 2 diabetes mellitus without complications: Secondary | ICD-10-CM

## 2018-07-28 DIAGNOSIS — J449 Chronic obstructive pulmonary disease, unspecified: Secondary | ICD-10-CM | POA: Diagnosis not present

## 2018-07-28 DIAGNOSIS — C159 Malignant neoplasm of esophagus, unspecified: Secondary | ICD-10-CM | POA: Diagnosis not present

## 2018-07-28 DIAGNOSIS — I2510 Atherosclerotic heart disease of native coronary artery without angina pectoris: Secondary | ICD-10-CM

## 2018-07-28 DIAGNOSIS — D649 Anemia, unspecified: Secondary | ICD-10-CM | POA: Diagnosis not present

## 2018-07-29 ENCOUNTER — Telehealth: Payer: Self-pay | Admitting: Hematology

## 2018-07-29 ENCOUNTER — Other Ambulatory Visit: Payer: Self-pay | Admitting: Cardiovascular Disease

## 2018-07-29 DIAGNOSIS — R1310 Dysphagia, unspecified: Secondary | ICD-10-CM | POA: Diagnosis not present

## 2018-07-29 DIAGNOSIS — Z85828 Personal history of other malignant neoplasm of skin: Secondary | ICD-10-CM | POA: Diagnosis not present

## 2018-07-29 DIAGNOSIS — J449 Chronic obstructive pulmonary disease, unspecified: Secondary | ICD-10-CM | POA: Diagnosis not present

## 2018-07-29 DIAGNOSIS — Z794 Long term (current) use of insulin: Secondary | ICD-10-CM | POA: Diagnosis not present

## 2018-07-29 DIAGNOSIS — I10 Essential (primary) hypertension: Secondary | ICD-10-CM | POA: Diagnosis not present

## 2018-07-29 DIAGNOSIS — Z87891 Personal history of nicotine dependence: Secondary | ICD-10-CM | POA: Diagnosis not present

## 2018-07-29 DIAGNOSIS — I2510 Atherosclerotic heart disease of native coronary artery without angina pectoris: Secondary | ICD-10-CM | POA: Diagnosis not present

## 2018-07-29 DIAGNOSIS — E119 Type 2 diabetes mellitus without complications: Secondary | ICD-10-CM | POA: Diagnosis not present

## 2018-07-29 DIAGNOSIS — K219 Gastro-esophageal reflux disease without esophagitis: Secondary | ICD-10-CM | POA: Diagnosis not present

## 2018-07-29 DIAGNOSIS — K222 Esophageal obstruction: Secondary | ICD-10-CM | POA: Diagnosis not present

## 2018-07-29 DIAGNOSIS — G4730 Sleep apnea, unspecified: Secondary | ICD-10-CM | POA: Diagnosis not present

## 2018-07-29 NOTE — Telephone Encounter (Signed)
No los per 7/22.

## 2018-08-05 ENCOUNTER — Other Ambulatory Visit: Payer: Self-pay

## 2018-08-05 NOTE — Progress Notes (Signed)
Presented at MDT on 07/28/18 -  Recommendation: Referral to Dr. Servando Snare, she will call Dr. Burr Medico if she declines surgery and decides to have surveillance

## 2018-08-09 DIAGNOSIS — G4733 Obstructive sleep apnea (adult) (pediatric): Secondary | ICD-10-CM | POA: Diagnosis not present

## 2018-08-30 ENCOUNTER — Other Ambulatory Visit: Payer: Self-pay

## 2018-08-30 ENCOUNTER — Ambulatory Visit (INDEPENDENT_AMBULATORY_CARE_PROVIDER_SITE_OTHER): Payer: Medicare HMO | Admitting: Family Medicine

## 2018-08-30 ENCOUNTER — Encounter: Payer: Self-pay | Admitting: Family Medicine

## 2018-08-30 DIAGNOSIS — L249 Irritant contact dermatitis, unspecified cause: Secondary | ICD-10-CM

## 2018-08-30 MED ORDER — CLOTRIMAZOLE-BETAMETHASONE 1-0.05 % EX CREA: 1.0000 "application " | TOPICAL_CREAM | Freq: Two times a day (BID) | CUTANEOUS | 1 refills | Status: DC

## 2018-08-30 NOTE — Progress Notes (Signed)
    Subjective:    Patient ID: [name], female    DOB: 27-Aug-1950, 68 y.o.   MRN: [id number]   HPI: Solia Fahl is a 68 y.o. female presenting for 5 dayd of erythematous pruritic eruption with direct spread over right dorsal upper arm. Has poison ivy vines in her yard, but hasn't touched them that she knws of. It is different from the eczema she has had. It is smooth in the middle and bumpy around the edge. No fever.   Depression screen [facility] 2/9 03/23/2018 12/17/2017 11/21/2016  Decreased Interest 0 0 0  Down, Depressed, Hopeless 0 0 0  PHQ - 2 Score 0 0 0     Relevant past medical, surgical, family and social history reviewed and updated as indicated.  Interim medical history since our last visit reviewed. Allergies and medications reviewed and updated.  ROS:  Review of Systems  Unremarkable except as in HPI Social History   Tobacco Use  Smoking Status Former Smoker  . Packs/day: 1.50  . Years: 41.00  . Pack years: 61.50  . Types: Cigarettes  . Start date: 48  . Quit date: 2010  . Years since quitting: 10.6  Smokeless Tobacco Never Used       Objective:     Wt Readings from Last 3 Encounters:  05/11/18 158 lb 12.8 oz (72 kg)  05/03/18 160 lb (72.6 kg)  04/08/18 159 lb (72.1 kg)     Exam deferred. Pt. Harboring due to COVID 19. Phone visit performed.   Assessment & Plan:   1. Irritant contact dermatitis, unspecified trigger     Meds ordered this encounter  Medications  . clotrimazole-betamethasone (LOTRISONE) cream    Sig: Apply 1 application topically 2 (two) times daily. To affected areas until rash clears    Dispense:  45 g    Refill:  1    No orders of the defined types were placed in this encounter.     Diagnoses and all orders for this visit:  Irritant contact dermatitis, unspecified trigger  Other orders -     clotrimazole-betamethasone (LOTRISONE) cream; Apply 1 application topically 2 (two) times daily. To affected  areas until rash clears    Virtual Visit via telephone Note  I discussed the limitations, risks, security and privacy concerns of performing an evaluation and management service by telephone and the availability of in person appointments. The patient was identified with two identifiers. Pt.expressed understanding and agreed to proceed. Pt. Is at home. Dr. Livia Snellen is in his office.  Follow Up Instructions:   I discussed the assessment and treatment plan with the patient. The patient was provided an opportunity to ask questions and all were answered. The patient agreed with the plan and demonstrated an understanding of the instructions.   The patient was advised to call back or seek an in-person evaluation if the symptoms worsen or if the condition fails to improve as anticipated.   Total minutes including chart review and phone contact time: 8   Follow up plan: Return if symptoms worsen or fail to improve.  Claretta Fraise, MD [place]

## 2018-09-09 DIAGNOSIS — G4733 Obstructive sleep apnea (adult) (pediatric): Secondary | ICD-10-CM | POA: Diagnosis not present

## 2018-09-14 DIAGNOSIS — Z20828 Contact with and (suspected) exposure to other viral communicable diseases: Secondary | ICD-10-CM | POA: Diagnosis not present

## 2018-09-14 DIAGNOSIS — Z01812 Encounter for preprocedural laboratory examination: Secondary | ICD-10-CM | POA: Diagnosis not present

## 2018-09-16 DIAGNOSIS — T18108A Unspecified foreign body in esophagus causing other injury, initial encounter: Secondary | ICD-10-CM | POA: Diagnosis not present

## 2018-09-16 DIAGNOSIS — K219 Gastro-esophageal reflux disease without esophagitis: Secondary | ICD-10-CM | POA: Diagnosis not present

## 2018-09-16 DIAGNOSIS — I10 Essential (primary) hypertension: Secondary | ICD-10-CM | POA: Diagnosis not present

## 2018-09-16 DIAGNOSIS — Z85828 Personal history of other malignant neoplasm of skin: Secondary | ICD-10-CM | POA: Diagnosis not present

## 2018-09-16 DIAGNOSIS — Z978 Presence of other specified devices: Secondary | ICD-10-CM | POA: Diagnosis not present

## 2018-09-16 DIAGNOSIS — K222 Esophageal obstruction: Secondary | ICD-10-CM | POA: Diagnosis not present

## 2018-09-16 DIAGNOSIS — J449 Chronic obstructive pulmonary disease, unspecified: Secondary | ICD-10-CM | POA: Diagnosis not present

## 2018-09-16 DIAGNOSIS — G4730 Sleep apnea, unspecified: Secondary | ICD-10-CM | POA: Diagnosis not present

## 2018-09-16 DIAGNOSIS — Z87891 Personal history of nicotine dependence: Secondary | ICD-10-CM | POA: Diagnosis not present

## 2018-09-16 DIAGNOSIS — E119 Type 2 diabetes mellitus without complications: Secondary | ICD-10-CM | POA: Diagnosis not present

## 2018-09-16 DIAGNOSIS — I2510 Atherosclerotic heart disease of native coronary artery without angina pectoris: Secondary | ICD-10-CM | POA: Diagnosis not present

## 2018-09-16 DIAGNOSIS — T18198A Other foreign object in esophagus causing other injury, initial encounter: Secondary | ICD-10-CM | POA: Diagnosis not present

## 2018-09-16 DIAGNOSIS — Z9689 Presence of other specified functional implants: Secondary | ICD-10-CM | POA: Diagnosis not present

## 2018-10-04 ENCOUNTER — Ambulatory Visit (INDEPENDENT_AMBULATORY_CARE_PROVIDER_SITE_OTHER): Payer: Medicare HMO | Admitting: Internal Medicine

## 2018-10-09 DIAGNOSIS — G4733 Obstructive sleep apnea (adult) (pediatric): Secondary | ICD-10-CM | POA: Diagnosis not present

## 2018-10-11 DIAGNOSIS — Z01812 Encounter for preprocedural laboratory examination: Secondary | ICD-10-CM | POA: Diagnosis not present

## 2018-10-11 DIAGNOSIS — Z20828 Contact with and (suspected) exposure to other viral communicable diseases: Secondary | ICD-10-CM | POA: Diagnosis not present

## 2018-10-14 DIAGNOSIS — K222 Esophageal obstruction: Secondary | ICD-10-CM | POA: Diagnosis not present

## 2018-10-14 DIAGNOSIS — I10 Essential (primary) hypertension: Secondary | ICD-10-CM | POA: Diagnosis not present

## 2018-10-14 DIAGNOSIS — I2510 Atherosclerotic heart disease of native coronary artery without angina pectoris: Secondary | ICD-10-CM | POA: Diagnosis not present

## 2018-10-14 DIAGNOSIS — K219 Gastro-esophageal reflux disease without esophagitis: Secondary | ICD-10-CM | POA: Diagnosis not present

## 2018-10-14 DIAGNOSIS — Z85828 Personal history of other malignant neoplasm of skin: Secondary | ICD-10-CM | POA: Diagnosis not present

## 2018-10-14 DIAGNOSIS — J449 Chronic obstructive pulmonary disease, unspecified: Secondary | ICD-10-CM | POA: Diagnosis not present

## 2018-10-14 DIAGNOSIS — D649 Anemia, unspecified: Secondary | ICD-10-CM | POA: Diagnosis not present

## 2018-10-14 DIAGNOSIS — G4730 Sleep apnea, unspecified: Secondary | ICD-10-CM | POA: Diagnosis not present

## 2018-10-14 DIAGNOSIS — E119 Type 2 diabetes mellitus without complications: Secondary | ICD-10-CM | POA: Diagnosis not present

## 2018-10-14 DIAGNOSIS — Z87891 Personal history of nicotine dependence: Secondary | ICD-10-CM | POA: Diagnosis not present

## 2018-11-08 DIAGNOSIS — R69 Illness, unspecified: Secondary | ICD-10-CM | POA: Diagnosis not present

## 2018-11-18 ENCOUNTER — Telehealth: Payer: Self-pay | Admitting: Gastroenterology

## 2018-11-18 ENCOUNTER — Emergency Department (HOSPITAL_COMMUNITY): Payer: Medicare HMO

## 2018-11-18 ENCOUNTER — Ambulatory Visit (HOSPITAL_COMMUNITY)
Admission: EM | Admit: 2018-11-18 | Discharge: 2018-11-18 | Disposition: A | Discharge status: Home / Self Care | Payer: Medicare HMO | Attending: Emergency Medicine | Admitting: Emergency Medicine

## 2018-11-18 ENCOUNTER — Emergency Department (HOSPITAL_COMMUNITY): Payer: Medicare HMO | Admitting: Certified Registered"

## 2018-11-18 ENCOUNTER — Encounter (HOSPITAL_COMMUNITY): Payer: Self-pay | Admitting: Emergency Medicine

## 2018-11-18 ENCOUNTER — Other Ambulatory Visit: Payer: Self-pay

## 2018-11-18 ENCOUNTER — Encounter (HOSPITAL_COMMUNITY)
Admission: EM | Disposition: A | Discharge status: Home / Self Care | Payer: Self-pay | Source: Home / Self Care | Attending: Emergency Medicine

## 2018-11-18 DIAGNOSIS — G4733 Obstructive sleep apnea (adult) (pediatric): Secondary | ICD-10-CM | POA: Diagnosis not present

## 2018-11-18 DIAGNOSIS — Z7984 Long term (current) use of oral hypoglycemic drugs: Secondary | ICD-10-CM | POA: Diagnosis not present

## 2018-11-18 DIAGNOSIS — I25119 Atherosclerotic heart disease of native coronary artery with unspecified angina pectoris: Secondary | ICD-10-CM | POA: Diagnosis not present

## 2018-11-18 DIAGNOSIS — Z7982 Long term (current) use of aspirin: Secondary | ICD-10-CM | POA: Diagnosis not present

## 2018-11-18 DIAGNOSIS — E119 Type 2 diabetes mellitus without complications: Secondary | ICD-10-CM | POA: Insufficient documentation

## 2018-11-18 DIAGNOSIS — Z9689 Presence of other specified functional implants: Secondary | ICD-10-CM | POA: Insufficient documentation

## 2018-11-18 DIAGNOSIS — Z87891 Personal history of nicotine dependence: Secondary | ICD-10-CM | POA: Diagnosis not present

## 2018-11-18 DIAGNOSIS — J439 Emphysema, unspecified: Secondary | ICD-10-CM | POA: Diagnosis not present

## 2018-11-18 DIAGNOSIS — X58XXXA Exposure to other specified factors, initial encounter: Secondary | ICD-10-CM | POA: Diagnosis not present

## 2018-11-18 DIAGNOSIS — T18198A Other foreign object in esophagus causing other injury, initial encounter: Secondary | ICD-10-CM | POA: Diagnosis not present

## 2018-11-18 DIAGNOSIS — R1310 Dysphagia, unspecified: Secondary | ICD-10-CM | POA: Diagnosis present

## 2018-11-18 DIAGNOSIS — E785 Hyperlipidemia, unspecified: Secondary | ICD-10-CM | POA: Insufficient documentation

## 2018-11-18 DIAGNOSIS — Z8501 Personal history of malignant neoplasm of esophagus: Secondary | ICD-10-CM | POA: Diagnosis not present

## 2018-11-18 DIAGNOSIS — T18108A Unspecified foreign body in esophagus causing other injury, initial encounter: Secondary | ICD-10-CM

## 2018-11-18 DIAGNOSIS — R079 Chest pain, unspecified: Secondary | ICD-10-CM | POA: Diagnosis not present

## 2018-11-18 DIAGNOSIS — K219 Gastro-esophageal reflux disease without esophagitis: Secondary | ICD-10-CM | POA: Insufficient documentation

## 2018-11-18 DIAGNOSIS — I10 Essential (primary) hypertension: Secondary | ICD-10-CM | POA: Insufficient documentation

## 2018-11-18 DIAGNOSIS — Z79899 Other long term (current) drug therapy: Secondary | ICD-10-CM | POA: Insufficient documentation

## 2018-11-18 DIAGNOSIS — T18128A Food in esophagus causing other injury, initial encounter: Secondary | ICD-10-CM

## 2018-11-18 DIAGNOSIS — R0602 Shortness of breath: Secondary | ICD-10-CM | POA: Diagnosis not present

## 2018-11-18 DIAGNOSIS — Z20828 Contact with and (suspected) exposure to other viral communicable diseases: Secondary | ICD-10-CM | POA: Insufficient documentation

## 2018-11-18 DIAGNOSIS — K222 Esophageal obstruction: Secondary | ICD-10-CM | POA: Diagnosis not present

## 2018-11-18 DIAGNOSIS — R000 Tachycardia, unspecified: Secondary | ICD-10-CM | POA: Diagnosis not present

## 2018-11-18 HISTORY — PX: FOREIGN BODY REMOVAL: SHX962

## 2018-11-18 HISTORY — PX: ESOPHAGOGASTRODUODENOSCOPY (EGD) WITH PROPOFOL: SHX5813

## 2018-11-18 LAB — BASIC METABOLIC PANEL
Anion gap: 12 (ref 5–15)
BUN: 10 mg/dL (ref 8–23)
CO2: 25 mmol/L (ref 22–32)
Calcium: 9.6 mg/dL (ref 8.9–10.3)
Chloride: 103 mmol/L (ref 98–111)
Creatinine, Ser: 0.69 mg/dL (ref 0.44–1.00)
GFR calc Af Amer: >60 mL/min (ref >60)
GFR calc non Af Amer: >60 mL/min (ref >60)
Glucose, Bld: 163 mg/dL — ABNORMAL HIGH (ref 70–99)
Potassium: 3.5 mmol/L (ref 3.5–5.1)
Sodium: 140 mmol/L (ref 135–145)

## 2018-11-18 LAB — CBC
HCT: 37.7 % (ref 36.0–46.0)
Hemoglobin: 11.9 g/dL — ABNORMAL LOW (ref 12.0–15.0)
MCH: 28.4 pg (ref 26.0–34.0)
MCHC: 31.6 g/dL (ref 30.0–36.0)
MCV: 90 fL (ref 80.0–100.0)
Platelets: 130 10*3/uL — ABNORMAL LOW (ref 150–400)
RBC: 4.19 MIL/uL (ref 3.87–5.11)
RDW: 14.1 % (ref 11.5–15.5)
WBC: 4.1 10*3/uL (ref 4.0–10.5)
nRBC: 0 % (ref 0.0–0.2)

## 2018-11-18 LAB — SARS CORONAVIRUS 2 BY RT PCR (HOSPITAL ORDER, PERFORMED IN ~~LOC~~ HOSPITAL LAB): SARS Coronavirus 2: NEGATIVE

## 2018-11-18 SURGERY — ESOPHAGOGASTRODUODENOSCOPY (EGD) WITH PROPOFOL
Anesthesia: Monitor Anesthesia Care

## 2018-11-18 MED ORDER — PROPOFOL 10 MG/ML IV BOLUS
INTRAVENOUS | Status: DC | PRN
  Administered 2018-11-18: 25 mg via INTRAVENOUS
  Administered 2018-11-18: 50 mg via INTRAVENOUS
  Administered 2018-11-18 (×3): 20 mg via INTRAVENOUS
  Administered 2018-11-18: 25 mg via INTRAVENOUS
  Administered 2018-11-18: 20 mg via INTRAVENOUS

## 2018-11-18 MED ORDER — ALBUTEROL SULFATE HFA 108 (90 BASE) MCG/ACT IN AERS: 4.0000 | INHALATION_SPRAY | Freq: Once | RESPIRATORY_TRACT | Status: DC

## 2018-11-18 MED ORDER — SODIUM CHLORIDE 0.9 % IV SOLN: INTRAVENOUS | Status: DC

## 2018-11-18 MED ORDER — LACTATED RINGERS IV SOLN
INTRAVENOUS | Status: DC
  Administered 2018-11-18: 1000 mL via INTRAVENOUS

## 2018-11-18 MED ORDER — LIDOCAINE 2% (20 MG/ML) 5 ML SYRINGE
INTRAMUSCULAR | Status: DC | PRN
  Administered 2018-11-18: 60 mg via INTRAVENOUS

## 2018-11-18 SURGICAL SUPPLY — 14 items

## 2018-11-18 NOTE — Telephone Encounter (Signed)
Spoke with pt and she states she is on the way to [facility] ER. She thinks the stent that she had placed in her esophagus has clogged. [facility] placed the stent, pt states she called [facility] and was told to go to the ER-she is unable to get anything down. Pt states she could not make it to [facility]. Dr. Hilarie Fredrickson notified.

## 2018-11-18 NOTE — Transfer of Care (Signed)
Immediate Anesthesia Transfer of Care Note  Patient: Erica Braun  Procedure(s) Performed: ESOPHAGOGASTRODUODENOSCOPY (EGD) WITH PROPOFOL (N/A )  Patient Location: PACU  Anesthesia Type:MAC  Level of Consciousness: awake  Airway & Oxygen Therapy: Patient Spontanous Breathing and Patient connected to nasal cannula oxygen  Post-op Assessment: Report given to RN and Post -op Vital signs reviewed and stable  Post vital signs: Reviewed and stable  Last Vitals:  Vitals Value Taken Time  BP 103/47 11/18/18 1731  Temp    Pulse 90 11/18/18 1731  Resp 20 11/18/18 1731  SpO2 95 % 11/18/18 1731    Last Pain:  Vitals:   11/18/18 1731  TempSrc:   PainSc: 0-No pain         Complications: No apparent anesthesia complications

## 2018-11-18 NOTE — Anesthesia Preprocedure Evaluation (Signed)
Anesthesia Evaluation  Patient identified by MRN, date of birth, ID band Patient awake    Reviewed: Allergy & Precautions, NPO status , Patient's Chart, lab work & pertinent test results  History of Anesthesia Complications (+) PONV  Airway Mallampati: I  TM Distance: >3 FB Neck ROM: Full    Dental  (+) Edentulous Upper, Edentulous Lower   Pulmonary sleep apnea , COPD,  COPD inhaler, former smoker,    breath sounds clear to auscultation       Cardiovascular hypertension, Pt. on medications and Pt. on home beta blockers + CAD   Rhythm:Regular Rate:Normal     Neuro/Psych negative neurological ROS  negative psych ROS   GI/Hepatic GERD  Medicated,  Endo/Other  diabetes, Type 2, Oral Hypoglycemic Agents  Renal/GU      Musculoskeletal negative musculoskeletal ROS (+)   Abdominal Normal abdominal exam  (+)   Peds  Hematology   Anesthesia Other Findings   Reproductive/Obstetrics                             Lab Results  Component Value Date   WBC 4.1 11/18/2018   HGB 11.9 (L) 11/18/2018   HCT 37.7 11/18/2018   MCV 90.0 11/18/2018   PLT 130 (L) 11/18/2018   Lab Results  Component Value Date   CREATININE 0.69 11/18/2018   BUN 10 11/18/2018   NA 140 11/18/2018   K 3.5 11/18/2018   CL 103 11/18/2018   CO2 25 11/18/2018   No results found for: INR, PROTIME   Anesthesia Physical  Anesthesia Plan  ASA: III  Anesthesia Plan: General   Post-op Pain Management:    Induction: Intravenous  PONV Risk Score and Plan: 4 or greater and Dexamethasone, Midazolam, Ondansetron and Scopolamine patch - Pre-op  Airway Management Planned: Oral ETT  Additional Equipment: None  Intra-op Plan:   Post-operative Plan: Extubation in OR  Informed Consent: I have reviewed the patients History and Physical, chart, labs and discussed the procedure including the risks, benefits and alternatives for  the proposed anesthesia with the patient or authorized representative who has indicated his/her understanding and acceptance.     Dental advisory given  Plan Discussed with: CRNA and Anesthesiologist  Anesthesia Plan Comments:         Anesthesia Quick Evaluation

## 2018-11-18 NOTE — ED Provider Notes (Signed)
[facility] EMERGENCY DEPARTMENT Provider Note   CSN: [id number] Arrival date & time: 11/18/18  1329     History   Chief Complaint Chief Complaint  Patient presents with  . Respiratory Distress    HPI Rikiyah Quackenbush is a 68 y.o. female.     HPI  Pt is a 68 y/o female with a h/o COPD, DM, esophageal CA, esophageal stenosis s/p esophageal stenting, HLD, HTN who presents to the ED today for eval of resp distress. State she took her medications this AM, then ate some oatmeal, and started to choke. She then vomited and since then has been unable to tolerate any liquids including or own secretions. She is also c/o difficulty breathing. She does not feel like she aspirated.  Past Medical History:  Diagnosis Date  . Complication of anesthesia   . COPD (chronic obstructive pulmonary disease) ([place])   . Coronary artery disease   . Diabetes mellitus without complication ([place])   . Diverticulosis   . Early cataracts, bilateral 11/21/2016  . Emphysema of lung ([place])   . GERD (gastroesophageal reflux disease)   . Glaucoma   . Hyperlipidemia   . Hypertension   . Internal hemorrhoids   . Pneumonia   . PONV (postoperative nausea and vomiting)   . Sleep apnea    uses Cpap  . Spasm of the cricopharyngeus muscle   . Squamous cell esophageal cancer ([place]) 05/10/2018    Patient Active Problem List   Diagnosis Date Noted  . Squamous cell esophageal cancer ([place]) 05/10/2018  . CAD in native artery 06/11/2017  . Abnormal nuclear cardiac imaging test 06/11/2017  . Angina pectoris ([place])   . OSA on CPAP 12/19/2016  . Type 2 diabetes mellitus without complication, without long-term current use of insulin ([place]) 11/21/2016  . GERD with esophagitis 11/21/2016  . History of esophageal stricture 11/21/2016  . Hypertension associated with diabetes ([place]) 11/21/2016  . Hyperlipidemia associated with type 2 diabetes mellitus ([place]) 11/21/2016  . COPD with emphysema ([place]) 11/21/2016   . Early cataracts, bilateral 11/21/2016    Past Surgical History:  Procedure Laterality Date  . ABDOMINAL HYSTERECTOMY     cervical dysplasia  . BIOPSY  05/03/2018   Procedure: BIOPSY;  Surgeon: Rush Landmark Telford Nab., MD;  Location: [facility];  Service: Gastroenterology;;  . BREAST BIOPSY Left   . BREAST LUMPECTOMY Left   . ESOPHAGOGASTRODUODENOSCOPY (EGD) WITH PROPOFOL N/A 05/03/2018   Procedure: ESOPHAGOGASTRODUODENOSCOPY (EGD) WITH PROPOFOL;  Surgeon: Rush Landmark Telford Nab., MD;  Location: [facility];  Service: Gastroenterology;  Laterality: N/A;  . EUS  05/03/2018   Procedure: UPPER ENDOSCOPIC ULTRASOUND (EUS) RADIAL;  Surgeon: Rush Landmark Telford Nab., MD;  Location: [place];  Service: Gastroenterology;;  . LEFT HEART CATH AND CORONARY ANGIOGRAPHY N/A 06/11/2017   Procedure: LEFT HEART CATH AND CORONARY ANGIOGRAPHY;  Surgeon: Belva Crome, MD;  Location: [facility];  Service: Cardiovascular;  Laterality: N/A;     OB History   No obstetric history on file.      Home Medications    Prior to Admission medications   Medication Sig Start Date End Date Taking? Authorizing Provider  albuterol (PROVENTIL) (2.5 MG/3ML) 0.083% nebulizer solution Take 3 mLs (2.5 mg total) every 6 (six) hours as needed by nebulization for wheezing or shortness of breath. 11/21/16   Raylene Everts, MD  ANORO ELLIPTA 62.5-25 MCG/INH AEPB Inhale 1 puff into the lungs daily.  03/18/18   [provider]  aspirin EC 81 MG  tablet Take 81 mg by mouth daily.    [provider]  atorvastatin (LIPITOR) 40 MG tablet Take 1 tablet by mouth once daily 07/29/18   Herminio Commons, MD  Blood Glucose Monitor Software DEVI Test BS BID and PRN Dx E11.9 03/31/18   Baruch Gouty, [name]  CALCIUM PO Take 1 tablet by mouth daily.    [provider]  Cholecalciferol (VITAMIN D3) 2000 units TABS Take 4,000 Units by mouth daily.    [provider]   clotrimazole-betamethasone (LOTRISONE) cream Apply 1 application topically 2 (two) times daily. To affected areas until rash clears 08/30/18   Claretta Fraise, MD  glimepiride (AMARYL) 2 MG tablet Take 1 tablet (2 mg total) by mouth daily with breakfast. 03/23/18   Baruch Gouty, [name]  glucose blood test strip test BS BID Dx E11.9 03/30/18   Baruch Gouty, [name]  isosorbide mononitrate (IMDUR) 30 MG 24 hr tablet Take 1 tablet (30 mg total) by mouth 2 (two) times daily. Patient taking differently: Take 30 mg by mouth daily.  07/22/17 07/22/18  Herminio Commons, MD  Lancets [facility] ULTRASOFT) lancets Test BS BID Dx E11.9 03/30/18   Baruch Gouty, [name]  losartan (COZAAR) 25 MG tablet Take 1 tablet (25 mg total) by mouth daily. 03/23/18   Baruch Gouty, [name]  metFORMIN (GLUCOPHAGE) 1000 MG tablet Take 1 tablet (1,000 mg total) by mouth 2 (two) times daily with a meal. 03/23/18   Rakes, Connye Burkitt, [name]  metoprolol tartrate (LOPRESSOR) 50 MG tablet Take 1 tablet by mouth twice daily Patient taking differently: Take 50 mg by mouth 2 (two) times daily.  04/05/18   Herminio Commons, MD  Multiple Vitamins-Minerals (MULTIVITAMIN PO) Take 1 tablet by mouth daily.    [provider]  nitroGLYCERIN (NITROSTAT) 0.4 MG SL tablet Place 1 tablet (0.4 mg total) under the tongue every 5 (five) minutes as needed for chest pain. 06/11/17 06/11/18  Belva Crome, MD  omeprazole (PRILOSEC) 40 MG capsule Take 1 capsule (40 mg total) by mouth 2 (two) times daily before a meal. 05/03/18 08/31/18  Mansouraty, Telford Nab., MD    Family History Family History  Problem Relation Age of Onset  . Cancer Mother        breast  . Arthritis Mother   . COPD Father 67       emphysema  . Alcohol abuse Father   . Diabetes Father   . Cancer Sister        breast  . Early death Brother        overdose  . Drug abuse Brother   . Alcohol abuse Paternal Aunt   . COPD Paternal 38   . Cancer Maternal Grandmother        liver  cancer  . Diabetes Maternal Grandmother   . Alcohol abuse Maternal Grandfather   . Early death Maternal Grandfather   . Stroke Paternal Grandmother   . Heart disease Paternal Grandfather   . Esophageal cancer Neg Hx   . Stomach cancer Neg Hx     Social History Social History   Tobacco Use  . Smoking status: Former Smoker    Packs/day: 1.50    Years: 41.00    Pack years: 61.50    Types: Cigarettes    Start date: 1969    Quit date: 2010    Years since quitting: 10.8  . Smokeless tobacco: Never Used  Substance Use Topics  . Alcohol use: No  Frequency: Never  . Drug use: No     Allergies   Patient has no known allergies.   Review of Systems Review of Systems  Constitutional: Negative for fever.  HENT: Positive for trouble swallowing.   Eyes: Negative for visual disturbance.  Respiratory: Positive for shortness of breath.   Cardiovascular: Negative for chest pain.  Gastrointestinal: Positive for vomiting. Negative for abdominal pain.  Genitourinary: Negative for hematuria.  Musculoskeletal: Negative for back pain.  Skin: Negative for rash.  Neurological: Negative for seizures and headaches.  All other systems reviewed and are negative.    Physical Exam Updated Vital Signs BP (!) 147/55   Pulse 86   Temp 98.3 F (36.8 C) (Oral)   Resp 13   Ht 5\' 1"  (1.549 m)   Wt 60.3 kg   SpO2 98%   BMI 25.13 kg/m   Physical Exam Vitals signs and nursing note reviewed.  Constitutional:      General: She is not in acute distress.    Appearance: She is well-developed. She is ill-appearing.  HENT:     Head: Normocephalic and atraumatic.  Eyes:     Conjunctiva/sclera: Conjunctivae normal.  Neck:     Musculoskeletal: Neck supple.  Cardiovascular:     Rate and Rhythm: Regular rhythm. Tachycardia present.     Heart sounds: No murmur.  Pulmonary:     Breath sounds: Wheezing present.     Comments: tachypneic Abdominal:     Palpations: Abdomen is soft.  Skin:     General: Skin is warm and dry.  Neurological:     Mental Status: She is alert.      ED Treatments / Results  Labs (all labs ordered are listed, but only abnormal results are displayed) Labs Reviewed  CBC - Abnormal; Notable for the following components:      Result Value   Hemoglobin 11.9 (*)    Platelets 130 (*)    All other components within normal limits  BASIC METABOLIC PANEL - Abnormal; Notable for the following components:   Glucose, Bld 163 (*)    All other components within normal limits  SARS CORONAVIRUS 2 BY RT PCR [facility] ORDER, [place] [facility])    EKG EKG Interpretation  Date/Time:  Thursday November 18 2018 13:36:38 EST Ventricular Rate:  133 PR Interval:  138 QRS Duration: 76 QT Interval:  296 QTC Calculation: 440 R Axis:   41 Text Interpretation: Sinus tachycardia rate is faster compared to June 2019 Confirmed by Sherwood Gambler [phone]) on 11/18/2018 1:42:43 PM   Radiology Dg Chest Portable 1 View  Result Date: 11/18/2018 CLINICAL DATA:  68 year old female with chest pain. Esophageal stent placed. EXAM: PORTABLE CHEST 1 VIEW COMPARISON:  Chest CT dated 11/23/2017 FINDINGS: The lungs are clear. There is no pleural effusion or pneumothorax. The cardiac silhouette is within normal limits. A mid esophageal stent noted. There is air within the dilated esophagus above the stent. This less likely represents a pneumomediastinum. CT or upper GI study may provide better evaluation if clinically indicated. No acute osseous pathology. IMPRESSION: 1. No acute cardiopulmonary process. 2. Esophageal stent. Air within the dilated esophagus above the stent. Electronically Signed   By: Anner Crete M.D.   On: 11/18/2018 14:48    Procedures Procedures (including critical care time) CRITICAL CARE Performed by: Rodney Booze   Total critical care time: 32 minutes  Critical care time was exclusive of separately billable procedures and treating  other patients.  Critical care  was necessary to treat or prevent imminent or life-threatening deterioration.  Critical care was time spent personally by me on the following activities: development of treatment plan with patient and/or surrogate as well as nursing, discussions with consultants, evaluation of patient's response to treatment, examination of patient, obtaining history from patient or surrogate, ordering and performing treatments and interventions, ordering and review of laboratory studies, ordering and review of radiographic studies, pulse oximetry and re-evaluation of patient's condition.   Medications Ordered in ED Medications  0.9 %  sodium chloride infusion (has no administration in time range)  lactated ringers infusion (1,000 mLs Intravenous New Bag/Given 11/18/18 1647)     Initial Impression / Assessment and Plan / ED Course  I have reviewed the triage vital signs and the nursing notes.  Pertinent labs & imaging results that were available during my care of the patient were reviewed by me and considered in my medical decision making (see chart for details).   Final Clinical Impressions(s) / ED Diagnoses   Final diagnoses:  None   68 y/o female with a h/o esophageal CA, esophageal stenosis s/p esophageal stenting, HLD, HTN who presents to the ED today for eval of difficulty swallowing after swallowing a pill PTA.   On arrival, tachycardic, tachypneic, bp stable and normal sats.   CBC with mild anemia, improving BMP nonacute  EKG Sinus tachycardia rate is faster compared to June 2019  CXR with no acute cardiopulmonary process. 2. Esophageal stent. Air within the dilated esophagus above the stent.  2:23 PM CONSULT with Alonza Bogus, GI PA who will evaluate pt at bedside. Recommended COVID testing prior to anticipated EGD.   Pt taken to EGD. Care transitioned to [facility], PA-C at shift change pending EGD.   ED Discharge Orders    None       Bishop Dublin 11/19/18 2111    Sherwood Gambler, MD 11/22/18 [phone]

## 2018-11-18 NOTE — ED Triage Notes (Signed)
Pt arrives with reports of possible clogged stent in esophagus. Pt reports after taking 2 pills, she became SOB. Pt normally seen at [facility] but felt she could not make it that far.

## 2018-11-18 NOTE — Discharge Instructions (Signed)
YOU HAD AN ENDOSCOPIC PROCEDURE TODAY: Refer to the procedure report and other information in the discharge instructions given to you for any specific questions about what was found during the examination. If this information does not answer your questions, please call [facility] office at [phone] to clarify.  ° °YOU SHOULD EXPECT: Some feelings of bloating in the abdomen. Passage of more gas than usual. Walking can help get rid of the air that was put into your GI tract during the procedure and reduce the bloating. If you had a lower endoscopy (such as a colonoscopy or flexible sigmoidoscopy) you may notice spotting of blood in your stool or on the toilet paper. Some abdominal soreness may be present for a day or two, also. ° °DIET: Your first meal following the procedure should be a light meal and then it is ok to progress to your normal diet. A half-sandwich or bowl of soup is an example of a good first meal. Heavy or fried foods are harder to digest and may make you feel nauseous or bloated. Drink plenty of fluids but you should avoid alcoholic beverages for 24 hours. If you had a esophageal dilation, please see attached instructions for diet.   ° °ACTIVITY: Your care partner should take you home directly after the procedure. You should plan to take it easy, moving slowly for the rest of the day. You can resume normal activity the day after the procedure however YOU SHOULD NOT DRIVE, use power tools, machinery or perform tasks that involve climbing or major physical exertion for 24 hours (because of the sedation medicines used during the test).  ° °SYMPTOMS TO REPORT IMMEDIATELY: °A gastroenterologist can be reached at any hour. Please call [phone]  for any of the following symptoms:  °Following lower endoscopy (colonoscopy, flexible sigmoidoscopy) °Excessive amounts of blood in the stool  °Significant tenderness, worsening of abdominal pains  °Swelling of the abdomen that is new, acute  °Fever of 100° or  higher  °Following upper endoscopy (EGD, EUS, ERCP, esophageal dilation) °Vomiting of blood or coffee ground material  °New, significant abdominal pain  °New, significant chest pain or pain under the shoulder blades  °Painful or persistently difficult swallowing  °New shortness of breath  °Black, tarry-looking or red, bloody stools ° °FOLLOW UP:  °If any biopsies were taken you will be contacted by phone or by letter within the next 1-3 weeks. Call [phone]  if you have not heard about the biopsies in 3 weeks.  °Please also call with any specific questions about appointments or follow up tests. ° °

## 2018-11-18 NOTE — Telephone Encounter (Signed)
This is a Dr Hilarie Fredrickson pt. I will send to [facility].

## 2018-11-18 NOTE — Telephone Encounter (Signed)
Ok, I will forward to Dr. Henrene Pastor, who is the GI hospital doctor this week.  [name],FYI, I do not know which hospital she will come to locally

## 2018-11-18 NOTE — Anesthesia Postprocedure Evaluation (Signed)
Anesthesia Post Note  Patient: Erica Braun  Procedure(s) Performed: ESOPHAGOGASTRODUODENOSCOPY (EGD) WITH PROPOFOL (N/A )     Patient location during evaluation: PACU Anesthesia Type: MAC Level of consciousness: awake and alert Pain management: pain level controlled Vital Signs Assessment: post-procedure vital signs reviewed and stable Respiratory status: spontaneous breathing and respiratory function stable Cardiovascular status: stable Postop Assessment: no apparent nausea or vomiting Anesthetic complications: no    Last Vitals:  Vitals:   11/18/18 1755 11/18/18 1805  BP:    Pulse: 72 66  Resp: 19 (!) 24  Temp:    SpO2: 98% 99%    Last Pain:  Vitals:   11/18/18 1750  TempSrc:   PainSc: 0-No pain                 Ronan Dion DANIEL

## 2018-11-18 NOTE — Op Note (Signed)
[facility] Patient Name: Erica Braun Procedure Date : 11/18/2018 MRN: [id number] Attending MD: Docia Chuck. Henrene Pastor , MD Date of Birth: 06-06-1950 CSN: [id number] Age: 68 Admit Type: Inpatient Procedure:                Upper GI endoscopy with esophageal foreign body                            removal Indications:              Foreign body in the esophagus. The patient has had                            endoscopic mucosal resection of squamous cell                            cancer (Dr. Hilbert Corrigan at [facility]). Iatrogenic                            stricturing of the esophagus thereafter for which                            esophageal stents have been placed. Currently with                            food and/or pill impaction and difficulty handling                            secretions. Now for urgent endoscopy Providers:                Docia Chuck. Henrene Pastor, MD, Elmer Ramp. Tilden Dome, RN, Ladona Ridgel, Technician Referring MD:             Emergency room physician Medicines:                Monitored Anesthesia Care Complications:            No immediate complications. Estimated Blood Loss:     Estimated blood loss: none. Procedure:                Pre-Anesthesia Assessment:                           - Prior to the procedure, a History and Physical                            was performed, and patient medications and                            allergies were reviewed. The patient's tolerance of                            previous anesthesia was also reviewed. The risks  and benefits of the procedure and the sedation                            options and risks were discussed with the patient.                            All questions were answered, and informed consent                            was obtained. Prior Anticoagulants: The patient has                            taken no previous anticoagulant or antiplatelet                   agents. ASA Grade Assessment: II - A patient with                            mild systemic disease. After reviewing the risks                            and benefits, the patient was deemed in                            satisfactory condition to undergo the procedure.                           After obtaining informed consent, the endoscope was                            passed under direct vision. Throughout the                            procedure, the patient's blood pressure, pulse, and                            oxygen saturations were monitored continuously. The                            [id number]-[id number]) Olympus gastroscope was                            introduced through the mouth, and advanced to the                            second part of duodenum. The upper GI endoscopy was                            accomplished without difficulty. The patient                            tolerated the procedure well. Scope In: Scope Out: Findings:      At approximately 19 cm from the incisors was a soft food impaction. At  this level was the top portion of the SEMS with evidence of what       appeared to be an OVESCO anchoring clip. The food impaction was gently       advanced to the distal esophagus. The stent had its distal extent at       approximately 25 cm from the incisors (suggesting 6 cm stent length).       Below the stent at approximately 29 cm from the incisors was a       high-grade intrinsic esophageal stricture measuring approximately 3 mm       in diameter. The endoscope would not pass beyond this area. The       esophageal impaction was completely cleared.      The stomach was not examined .      The duodenum was not examined.      The patient tolerated the procedure well. Impression:               1. Esophageal food impaction with instant cleared                           2. High-grade stricturing of the native esophagus                             below the stent as described                           3. No evaluation of the stomach or duodenum. Recommendation:           1. Clear liquids only                           2. Contact Dr. Leroy Sea in the morning as you will                            need attention to your esophagus urgently. Procedure Code(s):        --- Professional ---                           [phone], Esophagogastroduodenoscopy, flexible,                            transoral; with removal of foreign body(s) Diagnosis Code(s):        --- Professional ---                           [id number], Food in esophagus causing other injury,                            initial encounter                           T18.108A, Unspecified foreign body in esophagus                            causing other injury, initial encounter CPT copyright 2019 American Medical Association. All rights reserved. The codes documented in this report are preliminary and upon coder review may  be  revised to meet current compliance requirements. Docia Chuck. Henrene Pastor, MD 11/18/2018 5:56:04 PM This report has been signed electronically. Number of Addenda: 0

## 2018-11-18 NOTE — Telephone Encounter (Signed)
Patient is calling because she had an esophageal stent put in and she thinks that it is clogged- said that she can not get anything down. She said that she does not know what to do- if she should go to the ED or come in and be seen.

## 2018-11-18 NOTE — ED Provider Notes (Deleted)
Accepted handoff at shift change from [place] PA-C. Please see prior provider note for more detail.   S: 68 y.o. female with history of DVT 1 week ago with cellulitis was placed on Levaquin but has continued experience swelling.   PE/labs: DVT study shows DVT presence.  Patient is currently on anticoagulation  DDX: concern for embolism, worsening cellulitis lower extremity.  Plan:  Begin on heparin if CTPA is positive for pulmonary embolism.  Otherwise admit for cellulitis legs likely will consult vascular for failure to start heparin or continue on current regimen of anticoagulation.  Briefly: Patient requiring IV antibiotics for cellulitis due to a failed patient treatment with Levaquin.  Patient being evaluated for clot in legs and lungs.       Physical Exam  BP 110/81   Pulse 77   Temp 98.7 F (37.1 C) (Oral)   Resp 18   SpO2 97%   See previous provider note for extensive physical exam.  CONSTITUTIONAL:   Ill-appearing, uncomfortable in bed NEURO:  Alert and oriented x 3, no focal deficits EYES:  pupils equal and reactive ENT/NECK:  trachea midline, no JVD CARDIO:   Tachycardia, regular rhythm, no murmur PULM:   Wheezing and tachypneic GI/GU:  Abdomen non-distended MSK/SPINE:  No gross deformities, no edema SKIN:  no rash, atraumatic PSYCH:  Appropriate speech and behavior   ED Course/Procedures     Procedures  MDM   Patient transported to endoscopy suite for EGD.  Patient then transported to PACU.  Did not return to ED.       Pati Gallo [place], [place] 11/18/18 2141

## 2018-11-18 NOTE — ED Notes (Signed)
Endoscopy team came to ED and transported to have procedure.

## 2018-11-18 NOTE — Consult Note (Addendum)
Referring Provider: EDP Primary Care Physician:  Baruch Gouty, [name] Primary Gastroenterologist:  Dr. Hilarie Fredrickson  Reason for Consultation:  Dysphagia  HPI: Erica Braun is a 68 y.o. female with PMH as listed below.  04/2018 EGD showed- HIGH GRADE SQUAMOUS DYSPLASIA/SQUAMOUS CELL CARCINOMA on biopsies.  Ultimately referred to [facility] where she has been following recently.  She underwent endoscopic resection on 07/08/18 at [facility] by Dr. Leroy Sea.  Since then she has experienced issues with dysphagia to due benign intrinsic narrowing.  Most recent procedure by Dr. Leroy Sea on 10/14/2018 as follows:  Findings: One benign-appearing, intrinsic severe stenosis was found  29 to 31 cm from the incisors. This stenosis measured 4 mm  (inner diameter) x 2 cm (in length). The stenosis was  traversed after dilation. A TTS dilator was passed through  the scope. Dilation with a 6-7-8 mm balloon dilator was  performed to 8 mm under fluoroscopic guidance. This was  stented with a 20 mm x 6 cm fully covered stent under  fluoroscopic guidance. OTSC Stentfix clip was placed in the lower third of the  esophagus to hold the stent. Estimated Blood Loss: Estimated blood loss: none. Impression: - Benign-appearing esophageal stenosis. Dilated.  Prosthesis placed. - Ligation was attempted. In the lower third of the  esophagus. - No specimens collected. Recommendation: - Discharge patient to home [Means]. - Repeat upper endoscopy in 10 weeks to remove stent.  Says that she took her pills this morning and felt like maybe it got stuck.  Then proceeded to eat oatmeal, which she says was very soupy.  Acutely felt like everything clogged up and she could not get anything down.  Says that she her secretions and mucus was coming up profusely.  Came to the ED here at [facility] ED.  Initially was tachycardic.  At this point she still feels like it is clogged but maybe partially cleared, not as severe.   Past Medical History:    Diagnosis Date   Complication of anesthesia    COPD (chronic obstructive pulmonary disease) ([place])    Coronary artery disease    Diabetes mellitus without complication (HCC)    Diverticulosis    Early cataracts, bilateral 11/21/2016   Emphysema of lung (HCC)    GERD (gastroesophageal reflux disease)    Glaucoma    Hyperlipidemia    Hypertension    Internal hemorrhoids    Pneumonia    PONV (postoperative nausea and vomiting)    Sleep apnea    uses Cpap   Spasm of the cricopharyngeus muscle    Squamous cell esophageal cancer ([place]) 05/10/2018    Past Surgical History:  Procedure Laterality Date   ABDOMINAL HYSTERECTOMY     cervical dysplasia   BIOPSY  05/03/2018   Procedure: BIOPSY;  Surgeon: Irving Copas., MD;  Location: [facility] ENDOSCOPY;  Service: Gastroenterology;;   BREAST BIOPSY Left    BREAST LUMPECTOMY Left    ESOPHAGOGASTRODUODENOSCOPY (EGD) WITH PROPOFOL N/A 05/03/2018   Procedure: ESOPHAGOGASTRODUODENOSCOPY (EGD) WITH PROPOFOL;  Surgeon: Irving Copas., MD;  Location: [facility] ENDOSCOPY;  Service: Gastroenterology;  Laterality: N/A;   EUS  05/03/2018   Procedure: UPPER ENDOSCOPIC ULTRASOUND (EUS) RADIAL;  Surgeon: Rush Landmark Telford Nab., MD;  Location: [place];  Service: Gastroenterology;;   LEFT HEART CATH AND CORONARY ANGIOGRAPHY N/A 06/11/2017   Procedure: LEFT HEART CATH AND CORONARY ANGIOGRAPHY;  Surgeon: Belva Crome, MD;  Location: [place] CV LAB;  Service: Cardiovascular;  Laterality: N/A;    Prior to Admission medications  Medication Sig Start Date End Date Taking? Authorizing Provider  albuterol (PROVENTIL) (2.5 MG/3ML) 0.083% nebulizer solution Take 3 mLs (2.5 mg total) every 6 (six) hours as needed by nebulization for wheezing or shortness of breath. 11/21/16   Raylene Everts, MD  ANORO ELLIPTA 62.5-25 MCG/INH AEPB Inhale 1 puff into the lungs daily.  03/18/18   [provider]  aspirin EC 81 MG tablet  Take 81 mg by mouth daily.    [provider]  atorvastatin (LIPITOR) 40 MG tablet Take 1 tablet by mouth once daily 07/29/18   Herminio Commons, MD  Blood Glucose Monitor Software DEVI Test BS BID and PRN Dx E11.9 03/31/18   Baruch Gouty, [name]  CALCIUM PO Take 1 tablet by mouth daily.    [provider]  Cholecalciferol (VITAMIN D3) 2000 units TABS Take 4,000 Units by mouth daily.    [provider]  clotrimazole-betamethasone (LOTRISONE) cream Apply 1 application topically 2 (two) times daily. To affected areas until rash clears 08/30/18   Claretta Fraise, MD  glimepiride (AMARYL) 2 MG tablet Take 1 tablet (2 mg total) by mouth daily with breakfast. 03/23/18   Baruch Gouty, [name]  glucose blood test strip test BS BID Dx E11.9 03/30/18   Baruch Gouty, [name]  isosorbide mononitrate (IMDUR) 30 MG 24 hr tablet Take 1 tablet (30 mg total) by mouth 2 (two) times daily. Patient taking differently: Take 30 mg by mouth daily.  07/22/17 07/22/18  Herminio Commons, MD  Lancets [facility] ULTRASOFT) lancets Test BS BID Dx E11.9 03/30/18   Baruch Gouty, [name]  losartan (COZAAR) 25 MG tablet Take 1 tablet (25 mg total) by mouth daily. 03/23/18   Baruch Gouty, [name]  metFORMIN (GLUCOPHAGE) 1000 MG tablet Take 1 tablet (1,000 mg total) by mouth 2 (two) times daily with a meal. 03/23/18   Rakes, Connye Burkitt, [name]  metoprolol tartrate (LOPRESSOR) 50 MG tablet Take 1 tablet by mouth twice daily Patient taking differently: Take 50 mg by mouth 2 (two) times daily.  04/05/18   Herminio Commons, MD  Multiple Vitamins-Minerals (MULTIVITAMIN PO) Take 1 tablet by mouth daily.    [provider]  nitroGLYCERIN (NITROSTAT) 0.4 MG SL tablet Place 1 tablet (0.4 mg total) under the tongue every 5 (five) minutes as needed for chest pain. 06/11/17 06/11/18  Belva Crome, MD  omeprazole (PRILOSEC) 40 MG capsule Take 1 capsule (40 mg total) by mouth 2 (two) times daily before a meal. 05/03/18 08/31/18   Mansouraty, Telford Nab., MD    No current facility-administered medications for this encounter.    Current Outpatient Medications  Medication Sig Dispense Refill   albuterol (PROVENTIL) (2.5 MG/3ML) 0.083% nebulizer solution Take 3 mLs (2.5 mg total) every 6 (six) hours as needed by nebulization for wheezing or shortness of breath. 75 mL 3   ANORO ELLIPTA 62.5-25 MCG/INH AEPB Inhale 1 puff into the lungs daily.      aspirin EC 81 MG tablet Take 81 mg by mouth daily.     atorvastatin (LIPITOR) 40 MG tablet Take 1 tablet by mouth once daily 90 tablet 0   Blood Glucose Monitor Software DEVI Test BS BID and PRN Dx E11.9 1 Device 1   CALCIUM PO Take 1 tablet by mouth daily.     Cholecalciferol (VITAMIN D3) 2000 units TABS Take 4,000 Units by mouth daily.     clotrimazole-betamethasone (LOTRISONE) cream Apply 1 application topically 2 (two) times daily. To  affected areas until rash clears 45 g 1   glimepiride (AMARYL) 2 MG tablet Take 1 tablet (2 mg total) by mouth daily with breakfast. 90 tablet 1   glucose blood test strip test BS BID Dx E11.9 200 each 3   isosorbide mononitrate (IMDUR) 30 MG 24 hr tablet Take 1 tablet (30 mg total) by mouth 2 (two) times daily. (Patient taking differently: Take 30 mg by mouth daily. ) 180 tablet 1   Lancets (ONETOUCH ULTRASOFT) lancets Test BS BID Dx E11.9 200 each 3   losartan (COZAAR) 25 MG tablet Take 1 tablet (25 mg total) by mouth daily. 90 tablet 3   metFORMIN (GLUCOPHAGE) 1000 MG tablet Take 1 tablet (1,000 mg total) by mouth 2 (two) times daily with a meal. 180 tablet 3   metoprolol tartrate (LOPRESSOR) 50 MG tablet Take 1 tablet by mouth twice daily (Patient taking differently: Take 50 mg by mouth 2 (two) times daily. ) 180 tablet 0   Multiple Vitamins-Minerals (MULTIVITAMIN PO) Take 1 tablet by mouth daily.     nitroGLYCERIN (NITROSTAT) 0.4 MG SL tablet Place 1 tablet (0.4 mg total) under the tongue every 5 (five) minutes as needed for  chest pain. 25 tablet 3   omeprazole (PRILOSEC) 40 MG capsule Take 1 capsule (40 mg total) by mouth 2 (two) times daily before a meal. 60 capsule 3    Allergies as of 11/18/2018   (No Known Allergies)    Family History  Problem Relation Age of Onset   Cancer Mother        breast   Arthritis Mother    COPD Father 34       emphysema   Alcohol abuse Father    Diabetes Father    Cancer Sister        breast   Early death Brother        overdose   Drug abuse Brother    Alcohol abuse Paternal Aunt    COPD Paternal Aunt    Cancer Maternal Grandmother        liver cancer   Diabetes Maternal Grandmother    Alcohol abuse Maternal Grandfather    Early death Maternal Grandfather    Stroke Paternal Grandmother    Heart disease Paternal Grandfather    Esophageal cancer Neg Hx    Stomach cancer Neg Hx     Social History   Socioeconomic History   Marital status: Married    Spouse name: Kashden Deboy   Number of children: 3   Years of education: 14   Highest education level: Not on file  Occupational History   Occupation: retired    Comment: court Education officer, museum strain: Not hard at all   Food insecurity    Worry: Never true    Inability: Never true   Transportation needs    Medical: No    Non-medical: No  Tobacco Use   Smoking status: Former Smoker    Packs/day: 1.50    Years: 41.00    Pack years: 61.50    Types: Cigarettes    Start date: 1969    Quit date: 2010    Years since quitting: 10.8   Smokeless tobacco: Never Used  Substance and Sexual Activity   Alcohol use: No    Frequency: Never   Drug use: No   Sexual activity: Yes    Birth control/protection: Post-menopausal  Lifestyle   Physical activity    Days per week: 4 days  Minutes per session: 30 min   Stress: Only a little  Relationships   Press photographer on phone: Not on file    Gets together: Not on file    Attends religious  service: Not on file    Active member of club or organization: Not on file    Attends meetings of clubs or organizations: Not on file    Relationship status: Not on file   Intimate partner violence    Fear of current or ex partner: No    Emotionally abused: No    Physically abused: No    Forced sexual activity: No  Other Topics Concern   Not on file  Social History Narrative   Recent move from [place] to walk   Married to [employer] dog at home    Review of Systems: ROS is O/W negative except as mentioned in HPI.  Physical Exam: Vital signs in last 24 hours: Temp:  [98.7 F (37.1 C)] 98.7 F (37.1 C) (11/12 1337) Pulse Rate:  [97-132] 97 (11/12 1423) Resp:  [21-30] 22 (11/12 1423) BP: (168)/(141) 168/141 (11/12 1339) SpO2:  [99 %-100 %] 100 % (11/12 1423)   General:  Alert, Well-developed, well-nourished, pleasant and cooperative in NAD Head:  Normocephalic and atraumatic. Eyes:  Sclera clear, no icterus.  Conjunctiva pink. Ears:  Normal auditory acuity. Mouth:  No deformity or lesions.   Lungs:  Clear throughout to auscultation.  No wheezes, crackles, or rhonchi.  Heart:  Regular rate and rhythm; no murmurs, clicks, rubs, or gallops. Abdomen:  Soft, non-distended.  BS present.  Non-tender. Msk:  Symmetrical without gross deformities. Pulses:  Normal pulses noted. Extremities:  Without clubbing or edema. Neurologic:  Alert and oriented x 4;  grossly normal neurologically. Skin:  Intact without significant lesions or rashes. Psych:  Alert and cooperative. Normal mood and affect.  Lab Results: Recent Labs    11/18/18 1345  WBC 4.1  HGB 11.9*  HCT 37.7  PLT 130*   Studies/Results: Dg Chest Portable 1 View  Result Date: 11/18/2018 CLINICAL DATA:  68 year old female with chest pain. Esophageal stent placed. EXAM: PORTABLE CHEST 1 VIEW COMPARISON:  Chest CT dated 11/23/2017 FINDINGS: The lungs are clear. There is no pleural effusion or pneumothorax. The  cardiac silhouette is within normal limits. A mid esophageal stent noted. There is air within the dilated esophagus above the stent. This less likely represents a pneumomediastinum. CT or upper GI study may provide better evaluation if clinically indicated. No acute osseous pathology. IMPRESSION: 1. No acute cardiopulmonary process. 2. Esophageal stent. Air within the dilated esophagus above the stent. Electronically Signed   By: Anner Crete M.D.   On: 11/18/2018 14:48   IMPRESSION:  *68 year old female with squamous cell carcinoma of the esophagus, Stage 1, for which she had endoscopic resection with Dr. Leroy Sea at [facility] in July 2020.  Since then she has experienced issues with dysphagia due to intrinsic stenosis.  Has had esophageal stents placed, most recent on 10/14/2018.  Now with what seems to be a clogged stent after taking pills and eating breakfast this AM.  Was not tolerating secretions at all.  At the time of my visit she felt slightly improved but still spitting up material.  PLAN: -EGD today to try to clear esophageal stent/food bolus once covid test is back.   Laban Emperor. Zehr  11/18/2018, 2:52 PM  GI ATTENDING  I did not see the patient personally  but reviewed the history, laboratories, x-rays reviewed.  Agree with comprehensive consultation note by GI physician assistant as outlined above.  Patient presents with obstruction of her esophageal stent (presumably food and/or pills).  Previous endoscopic mucosal resection of squamous carcinoma at [facility].  Has self-expanding metal stent in place for stricture related dysphagia.  She will require upper endoscopy with endoscopic relief of any obstruction.  We are waiting on Covid testing.  The endoscopy unit is aware.  We will notify the on-call GI physician this evening.  Docia Chuck. Geri Seminole., M.D. [facility] Division of Gastroenterology

## 2018-11-18 NOTE — ED Provider Notes (Signed)
Accepted handoff at shift change from [place] PA-C. Please see prior provider note for more detail.   S: 68 y.o. female with history of esophageal cancer who swallowed her pills this morning and feels like she can no longer swallow.  Consult already placed to GI advised him prior PA who will assess for EGD.  Plan is to discharge if EGD is successful retrieval after p.o. challenge and observation for 1 hour.     Physical Exam  BP 137/64   Pulse 66   Temp 98.6 F (37 C) (Tympanic)   Resp (!) 24   Ht 5\' 1"  (1.549 m)   Wt 60.3 kg   SpO2 99%   BMI 25.13 kg/m   See previous provider note for extensive physical exam.  CONSTITUTIONAL:   Ill-appearing, uncomfortable in bed NEURO:  Alert and oriented x 3, no focal deficits EYES:  pupils equal and reactive ENT/NECK:  trachea midline, no JVD CARDIO:   Tachycardia, regular rhythm, no murmur PULM:   Wheezing and tachypneic GI/GU:  Abdomen non-distended MSK/SPINE:  No gross deformities, no edema SKIN:  no rash, atraumatic PSYCH:  Appropriate speech and behavior   ED Course/Procedures     Procedures  MDM   Patient transported to endoscopy suite for EGD.  Patient then transported to PACU.  Did not return to ED.       Tedd Sias, [place] 11/18/18 2141    Tedd Sias, [place] 11/18/18 2149    Gareth Morgan, MD 11/19/18 [phone]

## 2018-11-19 ENCOUNTER — Encounter (HOSPITAL_COMMUNITY): Payer: Self-pay | Admitting: Internal Medicine

## 2018-11-20 DIAGNOSIS — Z01812 Encounter for preprocedural laboratory examination: Secondary | ICD-10-CM | POA: Diagnosis not present

## 2018-11-21 DIAGNOSIS — T18128A Food in esophagus causing other injury, initial encounter: Secondary | ICD-10-CM | POA: Diagnosis not present

## 2018-11-21 DIAGNOSIS — T18108A Unspecified foreign body in esophagus causing other injury, initial encounter: Secondary | ICD-10-CM | POA: Diagnosis not present

## 2018-11-21 DIAGNOSIS — R000 Tachycardia, unspecified: Secondary | ICD-10-CM | POA: Diagnosis not present

## 2018-11-21 DIAGNOSIS — E785 Hyperlipidemia, unspecified: Secondary | ICD-10-CM | POA: Diagnosis not present

## 2018-11-21 DIAGNOSIS — U071 COVID-19: Secondary | ICD-10-CM | POA: Diagnosis not present

## 2018-11-21 DIAGNOSIS — C159 Malignant neoplasm of esophagus, unspecified: Secondary | ICD-10-CM | POA: Diagnosis not present

## 2018-11-21 DIAGNOSIS — R1319 Other dysphagia: Secondary | ICD-10-CM | POA: Insufficient documentation

## 2018-11-21 DIAGNOSIS — R1310 Dysphagia, unspecified: Secondary | ICD-10-CM | POA: Insufficient documentation

## 2018-11-21 DIAGNOSIS — I10 Essential (primary) hypertension: Secondary | ICD-10-CM | POA: Diagnosis not present

## 2018-11-21 DIAGNOSIS — J449 Chronic obstructive pulmonary disease, unspecified: Secondary | ICD-10-CM | POA: Diagnosis not present

## 2018-11-21 DIAGNOSIS — K222 Esophageal obstruction: Secondary | ICD-10-CM | POA: Diagnosis not present

## 2018-11-21 DIAGNOSIS — Z978 Presence of other specified devices: Secondary | ICD-10-CM | POA: Diagnosis not present

## 2018-11-21 DIAGNOSIS — R1314 Dysphagia, pharyngoesophageal phase: Secondary | ICD-10-CM | POA: Diagnosis not present

## 2018-11-21 DIAGNOSIS — Z4659 Encounter for fitting and adjustment of other gastrointestinal appliance and device: Secondary | ICD-10-CM | POA: Diagnosis not present

## 2018-11-21 DIAGNOSIS — Z87891 Personal history of nicotine dependence: Secondary | ICD-10-CM | POA: Diagnosis not present

## 2018-11-21 DIAGNOSIS — I2510 Atherosclerotic heart disease of native coronary artery without angina pectoris: Secondary | ICD-10-CM | POA: Diagnosis not present

## 2018-11-21 DIAGNOSIS — Y849 Medical procedure, unspecified as the cause of abnormal reaction of the patient, or of later complication, without mention of misadventure at the time of the procedure: Secondary | ICD-10-CM | POA: Diagnosis not present

## 2018-11-21 DIAGNOSIS — E119 Type 2 diabetes mellitus without complications: Secondary | ICD-10-CM | POA: Diagnosis not present

## 2018-11-25 ENCOUNTER — Ambulatory Visit (HOSPITAL_COMMUNITY): Payer: Medicare HMO

## 2018-11-26 ENCOUNTER — Ambulatory Visit (INDEPENDENT_AMBULATORY_CARE_PROVIDER_SITE_OTHER): Payer: Medicare HMO | Admitting: Family Medicine

## 2018-11-26 ENCOUNTER — Encounter: Payer: Self-pay | Admitting: Family Medicine

## 2018-11-26 DIAGNOSIS — I10 Essential (primary) hypertension: Secondary | ICD-10-CM

## 2018-11-26 DIAGNOSIS — C159 Malignant neoplasm of esophagus, unspecified: Secondary | ICD-10-CM | POA: Diagnosis not present

## 2018-11-26 DIAGNOSIS — E119 Type 2 diabetes mellitus without complications: Secondary | ICD-10-CM

## 2018-11-26 DIAGNOSIS — K2100 Gastro-esophageal reflux disease with esophagitis, without bleeding: Secondary | ICD-10-CM

## 2018-11-26 DIAGNOSIS — E1159 Type 2 diabetes mellitus with other circulatory complications: Secondary | ICD-10-CM

## 2018-11-26 DIAGNOSIS — I152 Hypertension secondary to endocrine disorders: Secondary | ICD-10-CM

## 2018-11-26 DIAGNOSIS — Z09 Encounter for follow-up examination after completed treatment for conditions other than malignant neoplasm: Secondary | ICD-10-CM | POA: Diagnosis not present

## 2018-11-26 DIAGNOSIS — K222 Esophageal obstruction: Secondary | ICD-10-CM

## 2018-11-26 MED ORDER — OMEPRAZOLE 2 MG/ML ORAL SUSPENSION: 40.0000 mg | Freq: Two times a day (BID) | ORAL | 6 refills | Status: DC

## 2018-11-26 MED ORDER — LOSARTAN POTASSIUM POWD: 25.0000 mg | Freq: Every day | 1 refills | Status: DC

## 2018-11-26 MED ORDER — METFORMIN HCL ER 500 MG/5ML PO SRER: 500.0000 mg | Freq: Two times a day (BID) | ORAL | 6 refills | Status: DC

## 2018-11-26 NOTE — Progress Notes (Signed)
Virtual Visit via telephone Note Due to COVID-19 pandemic this visit was conducted virtually. This visit type was conducted due to national recommendations for restrictions regarding the COVID-19 Pandemic (e.g. social distancing, sheltering in place) in an effort to limit this patient's exposure and mitigate transmission in our community. All issues noted in this document were discussed and addressed.  A physical exam was not performed with this format.   I connected with Erica Braun on 11/26/2018 at 1020 by telephone and verified that I am speaking with the correct person using two identifiers. Erica Braun is currently located at home and family is currently with them during visit. The provider, Monia Pouch, [name] is located in their office at time of visit.  I discussed the limitations, risks, security and privacy concerns of performing an evaluation and management service by telephone and the availability of in person appointments. I also discussed with the patient that there may be a patient responsible charge related to this service. The patient expressed understanding and agreed to proceed.  Subjective:  Patient ID: Erica Braun, female    DOB: [birth year], 68 y.o.   MRN: [id number]  Chief Complaint:  Hospitalization Follow-up   HPI: Erica Braun is a 68 y.o. female presenting on 11/26/2018 for Hospitalization Follow-up   Pt is following up today after hospital discharge. Pt was seen at [facility] on 11/18/2018 for esophageal stricture with obstruction. Pt had an EGD and the food obstruction was cleared but her esophageal stent was noted to be displaced. She was discharged home and to follow up with her specialist at [facility] on the following Monday for another procedure to correct the dislodged stent. On Sunday, 11/21/2018, she was called by her specialist and told to go to the ED at [facility]. Upon arrival pt was to have an  emergent EGD but she tested positive for COVID-19 and the procedure had to be delayed until Monday, 11/22/2018. She underwent the EGD and did well. She is only able to tolerate liquids at this time and has been dong well with that. She states her specialist has a plan and another procedure scheduled for further correction of the esophageal stenosis. She states she has never had symptoms of COVID-19. States she is on day 7 after the positive results and has not had any symptoms. States she has been sheltering in place and the health department is checking in on her on a regular basis.  She states she has been crushing some of her medications and tolerating them. States she has not been taking her metformin because her blood sugars have not been over 115. States she has lost 40 plus pounds and does not know if it is still needed. She is not taking the statin or amaryl. She states her blood pressure is doing great and she feels fine other than the issues with her esophagus.     Relevant past medical, surgical, family, and social history reviewed and updated as indicated.  Allergies and medications reviewed and updated.   Past Medical History:  Diagnosis Date  . Complication of anesthesia   . COPD (chronic obstructive pulmonary disease) ([place])   . Coronary artery disease   . Diabetes mellitus without complication ([place])   . Diverticulosis   . Early cataracts, bilateral 11/21/2016  . Emphysema of lung ([place])   . GERD (gastroesophageal reflux disease)   . Glaucoma   . Hyperlipidemia   . Hypertension   . Internal hemorrhoids   .  Pneumonia   . PONV (postoperative nausea and vomiting)   . Sleep apnea    uses Cpap  . Spasm of the cricopharyngeus muscle   . Squamous cell esophageal cancer ([place]) 05/10/2018    Past Surgical History:  Procedure Laterality Date  . ABDOMINAL HYSTERECTOMY     cervical dysplasia  . BIOPSY  05/03/2018   Procedure: BIOPSY;  Surgeon: Rush Landmark Telford Nab., MD;  Location:  [place];  Service: Gastroenterology;;  . BREAST BIOPSY Left   . BREAST LUMPECTOMY Left   . ESOPHAGOGASTRODUODENOSCOPY (EGD) WITH PROPOFOL N/A 05/03/2018   Procedure: ESOPHAGOGASTRODUODENOSCOPY (EGD) WITH PROPOFOL;  Surgeon: Rush Landmark Telford Nab., MD;  Location: [place];  Service: Gastroenterology;  Laterality: N/A;  . ESOPHAGOGASTRODUODENOSCOPY (EGD) WITH PROPOFOL N/A 11/18/2018   Procedure: ESOPHAGOGASTRODUODENOSCOPY (EGD) WITH PROPOFOL;  Surgeon: Irene Shipper, MD;  Location[facility] ENDOSCOPY;  Service: Endoscopy;  Laterality: N/A;  . EUS  05/03/2018   Procedure: UPPER ENDOSCOPIC ULTRASOUND (EUS) RADIAL;  Surgeon: Rush Landmark Telford Nab., MD;  Location: [facility] ENDOSCOPY;  Service: Gastroenterology;;  . FOREIGN BODY REMOVAL  11/18/2018   Procedure: FOREIGN BODY REMOVAL;  Surgeon: Irene Shipper, MD;  Location: [facility] ENDOSCOPY;  Service: Endoscopy;;  . LEFT HEART CATH AND CORONARY ANGIOGRAPHY N/A 06/11/2017   Procedure: LEFT HEART CATH AND CORONARY ANGIOGRAPHY;  Surgeon: Belva Crome, MD;  Location: [place] CV LAB;  Service: Cardiovascular;  Laterality: N/A;    Social History   Socioeconomic History  . Marital status: Married    Spouse name: Jenny Reichmann  . Number of children: 3  . Years of education: 31  . Highest education level: Not on file  Occupational History  . Occupation: retired    Comment: Advertising account planner  Social Needs  . Financial resource strain: Not hard at all  . Food insecurity    Worry: Never true    Inability: Never true  . Transportation needs    Medical: No    Non-medical: No  Tobacco Use  . Smoking status: Former Smoker    Packs/day: 1.50    Years: 41.00    Pack years: 61.50    Types: Cigarettes    Start date: 1969    Quit date: 2010    Years since quitting: 10.8  . Smokeless tobacco: Never Used  Substance and Sexual Activity  . Alcohol use: No    Frequency: Never  . Drug use: No  . Sexual activity: Yes    Birth control/protection: Post-menopausal   Lifestyle  . Physical activity    Days per week: 4 days    Minutes per session: 30 min  . Stress: Only a little  Relationships  . Social Herbalist on phone: Not on file    Gets together: Not on file    Attends religious service: Not on file    Active member of club or organization: Not on file    Attends meetings of clubs or organizations: Not on file    Relationship status: Not on file  . Intimate partner violence    Fear of current or ex partner: No    Emotionally abused: No    Physically abused: No    Forced sexual activity: No  Other Topics Concern  . Not on file  Social History Narrative   Recent move from [place] to walk   Married to [employer] dog at home    Outpatient Encounter Medications as of 11/26/2018  Medication Sig  . albuterol (  PROVENTIL) (2.5 MG/3ML) 0.083% nebulizer solution Take 3 mLs (2.5 mg total) every 6 (six) hours as needed by nebulization for wheezing or shortness of breath.  Jearl Klinefelter ELLIPTA 62.5-25 MCG/INH AEPB Inhale 1 puff into the lungs daily.   Marland Kitchen aspirin EC 81 MG tablet Take 81 mg by mouth daily.  Marland Kitchen atorvastatin (LIPITOR) 40 MG tablet Take 1 tablet by mouth once daily  . Blood Glucose Monitor Software DEVI Test BS BID and PRN Dx E11.9  . CALCIUM PO Take 1 tablet by mouth daily.  . Cholecalciferol (VITAMIN D3) 2000 units TABS Take 4,000 Units by mouth daily.  . clotrimazole-betamethasone (LOTRISONE) cream Apply 1 application topically 2 (two) times daily. To affected areas until rash clears  . glimepiride (AMARYL) 2 MG tablet Take 1 tablet (2 mg total) by mouth daily with breakfast.  . glucose blood test strip test BS BID Dx E11.9  . isosorbide mononitrate (IMDUR) 30 MG 24 hr tablet Take 1 tablet (30 mg total) by mouth 2 (two) times daily. (Patient taking differently: Take 30 mg by mouth daily. )  . Lancets (ONETOUCH ULTRASOFT) lancets Test BS BID Dx E11.9  . losartan (COZAAR) 25 MG tablet Take 1 tablet (25 mg total) by mouth  daily.  . Losartan Potassium POWD Take 25 mg by mouth daily.  . metFORMIN (GLUCOPHAGE) 1000 MG tablet Take 1 tablet (1,000 mg total) by mouth 2 (two) times daily with a meal.  . metFORMIN HCl ER 500 MG/5ML SRER Take 500 mg by mouth 2 (two) times daily.  . metoprolol tartrate (LOPRESSOR) 50 MG tablet Take 1 tablet by mouth twice daily (Patient taking differently: Take 50 mg by mouth 2 (two) times daily. )  . Multiple Vitamins-Minerals (MULTIVITAMIN PO) Take 1 tablet by mouth daily.  . nitroGLYCERIN (NITROSTAT) 0.4 MG SL tablet Place 1 tablet (0.4 mg total) under the tongue every 5 (five) minutes as needed for chest pain.  Marland Kitchen omeprazole (FIRST-OMEPRAZOLE) 2 mg/mL SUSP oral suspension Take 20 mLs (40 mg total) by mouth 2 (two) times daily before a meal.  . omeprazole (PRILOSEC) 40 MG capsule Take 1 capsule (40 mg total) by mouth 2 (two) times daily before a meal.   No facility-administered encounter medications on file as of 11/26/2018.     No Known Allergies  Review of Systems  Constitutional: Negative for activity change, appetite change, chills, diaphoresis, fatigue, fever and unexpected weight change.  HENT: Positive for sore throat and trouble swallowing. Negative for voice change.   Eyes: Negative.   Respiratory: Positive for choking. Negative for cough, chest tightness, shortness of breath and wheezing.   Cardiovascular: Negative for chest pain, palpitations and leg swelling.  Gastrointestinal: Negative for abdominal pain, blood in stool, constipation, diarrhea, nausea and vomiting.  Endocrine: Negative.   Genitourinary: Negative for decreased urine volume, difficulty urinating, dysuria, frequency and urgency.  Musculoskeletal: Negative for arthralgias and myalgias.  Skin: Negative.  Negative for rash.  Allergic/Immunologic: Negative.   Neurological: Negative for dizziness, weakness and headaches.  Hematological: Negative.   Psychiatric/Behavioral: Negative for confusion,  hallucinations, sleep disturbance and suicidal ideas.  All other systems reviewed and are negative.        Observations/Objective: No vital signs or physical exam, this was a telephone or virtual health encounter.  Pt alert and oriented, answers all questions appropriately, and able to speak in full sentences.    Assessment and Plan: Emalia was seen today for hospitalization follow-up.  Diagnoses and all orders for this visit:  [facility]  discharge follow-up EGD at [facility] and [facility] for esophageal stricture with food bolus with obstruction. Doing well since last procedure at [facility]. Only tolerating liquids at this time and minimal medications due to inability to swallow the pills.   Esophageal stricture Squamous cell esophageal cancer (HCC) Gastroesophageal reflux disease with esophagitis without hemorrhage Will send in liquid due to esophageal stenosis.  -     omeprazole (FIRST-OMEPRAZOLE) 2 mg/mL SUSP oral suspension; Take 20 mLs (40 mg total) by mouth 2 (two) times daily before a meal.  Type 2 diabetes mellitus without complication, without long-term current use of insulin ([place]) Will send in liquid due to esophageal stenosis.  -     metFORMIN HCl ER 500 MG/5ML SRER; Take 500 mg by mouth 2 (two) times daily.  Hypertension associated with diabetes ([place]) Will send in liquid due to esophageal stenosis.  -     Losartan Potassium POWD; Take 25 mg by mouth daily.     Follow Up Instructions: Return if symptoms worsen or fail to improve.    I discussed the assessment and treatment plan with the patient. The patient was provided an opportunity to ask questions and all were answered. The patient agreed with the plan and demonstrated an understanding of the instructions.   The patient was advised to call back or seek an in-person evaluation if the symptoms worsen or if the condition fails to improve as anticipated.  The above assessment and management plan was discussed with the  patient. The patient verbalized understanding of and has agreed to the management plan. Patient is aware to call the clinic if they develop any new symptoms or if symptoms persist or worsen. Patient is aware when to return to the clinic for a follow-up visit. Patient educated on when it is appropriate to go to the emergency department.    I provided 25 minutes of non-face-to-face time during this encounter. The call started at 1020. The call ended at 1045. The other time was used for coordination of care.    Monia Pouch, [name]-C [facility] [address] [place], [place] [address] [phone] 11/26/2018

## 2018-11-26 NOTE — Progress Notes (Deleted)
Subjective:  Patient ID: Erica Braun, female    DOB: 04/10/1950, 68 y.o.   MRN: [id number]  Patient Care Team: Baruch Gouty, [name] as PCP - General (Family Medicine) Herminio Commons, MD as PCP - Cardiology (Cardiology)   Chief Complaint:  No chief complaint on file.   HPI: Erica Braun is a 68 y.o. female presenting on 11/26/2018 for No chief complaint on file.   HPI 1. Hospital discharge follow-up ***  2. Esophageal stricture ***  3. Squamous cell esophageal cancer (HCC) ***     Relevant past medical, surgical, family, and social history reviewed and updated as indicated.  Allergies and medications reviewed and updated. Date reviewed: Chart in Epic.   Past Medical History:  Diagnosis Date   Complication of anesthesia    COPD (chronic obstructive pulmonary disease) ([place])    Coronary artery disease    Diabetes mellitus without complication (HCC)    Diverticulosis    Early cataracts, bilateral 11/21/2016   Emphysema of lung (HCC)    GERD (gastroesophageal reflux disease)    Glaucoma    Hyperlipidemia    Hypertension    Internal hemorrhoids    Pneumonia    PONV (postoperative nausea and vomiting)    Sleep apnea    uses Cpap   Spasm of the cricopharyngeus muscle    Squamous cell esophageal cancer ([place]) 05/10/2018    Past Surgical History:  Procedure Laterality Date   ABDOMINAL HYSTERECTOMY     cervical dysplasia   BIOPSY  05/03/2018   Procedure: BIOPSY;  Surgeon: Irving Copas., MD;  Location: [facility] ENDOSCOPY;  Service: Gastroenterology;;   BREAST BIOPSY Left    BREAST LUMPECTOMY Left    ESOPHAGOGASTRODUODENOSCOPY (EGD) WITH PROPOFOL N/A 05/03/2018   Procedure: ESOPHAGOGASTRODUODENOSCOPY (EGD) WITH PROPOFOL;  Surgeon: Irving Copas., MD;  Location: [facility] ENDOSCOPY;  Service: Gastroenterology;  Laterality: N/A;   ESOPHAGOGASTRODUODENOSCOPY (EGD) WITH PROPOFOL N/A 11/18/2018   Procedure:  ESOPHAGOGASTRODUODENOSCOPY (EGD) WITH PROPOFOL;  Surgeon: Irene Shipper, MD;  Location: [facility] ENDOSCOPY;  Service: Endoscopy;  Laterality: N/A;   EUS  05/03/2018   Procedure: UPPER ENDOSCOPIC ULTRASOUND (EUS) RADIAL;  Surgeon: Irving Copas., MD;  Location: [facility] ENDOSCOPY;  Service: Gastroenterology;;   FOREIGN BODY REMOVAL  11/18/2018   Procedure: FOREIGN BODY REMOVAL;  Surgeon: Irene Shipper, MD;  Location: [facility] ENDOSCOPY;  Service: Endoscopy;;   LEFT HEART CATH AND CORONARY ANGIOGRAPHY N/A 06/11/2017   Procedure: LEFT HEART CATH AND CORONARY ANGIOGRAPHY;  Surgeon: Belva Crome, MD;  Location: [place] CV LAB;  Service: Cardiovascular;  Laterality: N/A;    Social History   Socioeconomic History   Marital status: Married    Spouse name: [name]   Number of children: 3   Years of education: 14   Highest education level: Not on file  Occupational History   Occupation: retired    Comment: court Education officer, museum strain: Not hard at all   Food insecurity    Worry: Never true    Inability: Never true   Transportation needs    Medical: No    Non-medical: No  Tobacco Use   Smoking status: Former Smoker    Packs/day: 1.50    Years: 41.00    Pack years: 61.50    Types: Cigarettes    Start date: 1969    Quit date: 2010    Years since quitting: 10.8   Smokeless tobacco: Never Used  Substance and Sexual  Activity   Alcohol use: No    Frequency: Never   Drug use: No   Sexual activity: Yes    Birth control/protection: Post-menopausal  Lifestyle   Physical activity    Days per week: 4 days    Minutes per session: 30 min   Stress: Only a little  Relationships   Press photographer on phone: Not on file    Gets together: Not on file    Attends religious service: Not on file    Active member of club or organization: Not on file    Attends meetings of clubs or organizations: Not on file    Relationship status: Not on file    Intimate partner violence    Fear of current or ex partner: No    Emotionally abused: No    Physically abused: No    Forced sexual activity: No  Other Topics Concern   Not on file  Social History Narrative   Recent move from [place] to walk   Married to [employer] dog at home    Outpatient Encounter Medications as of 11/26/2018  Medication Sig   albuterol (PROVENTIL) (2.5 MG/3ML) 0.083% nebulizer solution Take 3 mLs (2.5 mg total) every 6 (six) hours as needed by nebulization for wheezing or shortness of breath.   ANORO ELLIPTA 62.5-25 MCG/INH AEPB Inhale 1 puff into the lungs daily.    aspirin EC 81 MG tablet Take 81 mg by mouth daily.   atorvastatin (LIPITOR) 40 MG tablet Take 1 tablet by mouth once daily   Blood Glucose Monitor Software DEVI Test BS BID and PRN Dx E11.9   CALCIUM PO Take 1 tablet by mouth daily.   Cholecalciferol (VITAMIN D3) 2000 units TABS Take 4,000 Units by mouth daily.   clotrimazole-betamethasone (LOTRISONE) cream Apply 1 application topically 2 (two) times daily. To affected areas until rash clears   glimepiride (AMARYL) 2 MG tablet Take 1 tablet (2 mg total) by mouth daily with breakfast.   glucose blood test strip test BS BID Dx E11.9   isosorbide mononitrate (IMDUR) 30 MG 24 hr tablet Take 1 tablet (30 mg total) by mouth 2 (two) times daily. (Patient taking differently: Take 30 mg by mouth daily. )   Lancets (ONETOUCH ULTRASOFT) lancets Test BS BID Dx E11.9   losartan (COZAAR) 25 MG tablet Take 1 tablet (25 mg total) by mouth daily.   Losartan Potassium POWD Take 25 mg by mouth daily.   metFORMIN (GLUCOPHAGE) 1000 MG tablet Take 1 tablet (1,000 mg total) by mouth 2 (two) times daily with a meal.   metFORMIN HCl ER 500 MG/5ML SRER Take 500 mg by mouth 2 (two) times daily.   metoprolol tartrate (LOPRESSOR) 50 MG tablet Take 1 tablet by mouth twice daily (Patient taking differently: Take 50 mg by mouth 2 (two) times daily. )    Multiple Vitamins-Minerals (MULTIVITAMIN PO) Take 1 tablet by mouth daily.   nitroGLYCERIN (NITROSTAT) 0.4 MG SL tablet Place 1 tablet (0.4 mg total) under the tongue every 5 (five) minutes as needed for chest pain.   omeprazole (FIRST-OMEPRAZOLE) 2 mg/mL SUSP oral suspension Take 20 mLs (40 mg total) by mouth 2 (two) times daily before a meal.   omeprazole (PRILOSEC) 40 MG capsule Take 1 capsule (40 mg total) by mouth 2 (two) times daily before a meal.   No facility-administered encounter medications on file as of 11/26/2018.     No Known Allergies  Review of Systems      Objective:  There were no vitals taken for this visit.   Wt Readings from Last 3 Encounters:  11/18/18 133 lb (60.3 kg)  05/11/18 158 lb 12.8 oz (72 kg)  05/03/18 160 lb (72.6 kg)    Physical Exam  Results for orders placed or performed during the hospital encounter of 11/18/18  SARS Coronavirus 2 by RT PCR (hospital order, performed in [place] hospital lab) Nasopharyngeal Nasopharyngeal Swab   Specimen: Nasopharyngeal Swab  Result Value Ref Range   SARS Coronavirus 2 NEGATIVE NEGATIVE  CBC  Result Value Ref Range   WBC 4.1 4.0 - 10.5 K/uL   RBC 4.19 3.87 - 5.11 MIL/uL   Hemoglobin 11.9 (L) 12.0 - 15.0 g/dL   HCT 37.7 36.0 - 46.0 %   MCV 90.0 80.0 - 100.0 fL   MCH 28.4 26.0 - 34.0 pg   MCHC 31.6 30.0 - 36.0 g/dL   RDW 14.1 11.5 - 15.5 %   Platelets 130 (L) 150 - 400 K/uL   nRBC 0.0 0.0 - 0.2 %  Basic metabolic panel  Result Value Ref Range   Sodium 140 135 - 145 mmol/L   Potassium 3.5 3.5 - 5.1 mmol/L   Chloride 103 98 - 111 mmol/L   CO2 25 22 - 32 mmol/L   Glucose, Bld 163 (H) 70 - 99 mg/dL   BUN 10 8 - 23 mg/dL   Creatinine, Ser 0.69 0.44 - 1.00 mg/dL   Calcium 9.6 8.9 - 10.3 mg/dL   GFR calc non Af Amer >60 >60 mL/min   GFR calc Af Amer >60 >60 mL/min   Anion gap 12 5 - 15       Pertinent labs & imaging results that were available during my care of the patient were reviewed by me  and considered in my medical decision making.  Assessment & Plan:  Diagnoses and all orders for this visit:  Hospital discharge follow-up  Esophageal stricture -     omeprazole (FIRST-OMEPRAZOLE) 2 mg/mL SUSP oral suspension; Take 20 mLs (40 mg total) by mouth 2 (two) times daily before a meal.  Squamous cell esophageal cancer (HCC) -     omeprazole (FIRST-OMEPRAZOLE) 2 mg/mL SUSP oral suspension; Take 20 mLs (40 mg total) by mouth 2 (two) times daily before a meal.  Type 2 diabetes mellitus without complication, without long-term current use of insulin (HCC) -     metFORMIN HCl ER 500 MG/5ML SRER; Take 500 mg by mouth 2 (two) times daily.  Gastroesophageal reflux disease with esophagitis without hemorrhage -     omeprazole (FIRST-OMEPRAZOLE) 2 mg/mL SUSP oral suspension; Take 20 mLs (40 mg total) by mouth 2 (two) times daily before a meal.  Hypertension associated with diabetes ([place]) -     Losartan Potassium POWD; Take 25 mg by mouth daily.     Continue all other maintenance medications.  Follow up plan: Return if symptoms worsen or fail to improve.  Continue healthy lifestyle choices, including diet (rich in fruits, vegetables, and lean proteins, and low in salt and simple carbohydrates) and exercise (at least 30 minutes of moderate physical activity daily).  Educational handout given for ***  The above assessment and management plan was discussed with the patient. The patient verbalized understanding of and has agreed to the management plan. Patient is aware to call the clinic if they develop any new symptoms or if symptoms persist or worsen. Patient is aware when to return to  the clinic for a follow-up visit. Patient educated on when it is appropriate to go to the emergency department.   Monia Pouch, [name]-C [facility] [phone]

## 2018-11-26 NOTE — Patient Instructions (Signed)
Testing locations for COVID-19  Testing sites are open Monday - Friday from 8:00 am to 3:30 pm.  [facility]: [facility] at [facility], [address] [place], [place]: [place], [place], [place] (entrance off of [address])  [place]: [employer], [place], [place], [place] (across from [place] ED entrance)

## 2018-11-29 ENCOUNTER — Telehealth: Payer: Self-pay | Admitting: *Deleted

## 2018-11-29 DIAGNOSIS — K2100 Gastro-esophageal reflux disease with esophagitis, without bleeding: Secondary | ICD-10-CM

## 2018-11-29 DIAGNOSIS — I152 Hypertension secondary to endocrine disorders: Secondary | ICD-10-CM

## 2018-11-29 DIAGNOSIS — E1159 Type 2 diabetes mellitus with other circulatory complications: Secondary | ICD-10-CM

## 2018-11-29 DIAGNOSIS — C159 Malignant neoplasm of esophagus, unspecified: Secondary | ICD-10-CM

## 2018-11-29 DIAGNOSIS — E119 Type 2 diabetes mellitus without complications: Secondary | ICD-10-CM

## 2018-11-29 DIAGNOSIS — K222 Esophageal obstruction: Secondary | ICD-10-CM

## 2018-11-29 MED ORDER — METFORMIN HCL ER 500 MG/5ML PO SRER: 500.0000 mg | Freq: Two times a day (BID) | ORAL | 6 refills | Status: DC

## 2018-11-29 MED ORDER — OMEPRAZOLE 2 MG/ML ORAL SUSPENSION: 40.0000 mg | Freq: Two times a day (BID) | ORAL | 6 refills | Status: DC

## 2018-11-29 MED ORDER — LOSARTAN POTASSIUM POWD: 25.0000 mg | Freq: Every day | 1 refills | Status: AC

## 2018-11-29 NOTE — Telephone Encounter (Signed)
She has significant esophageal stenosis and underwent surgery. No G-Tube or feeding tube present. Can only take liquids at this time.  Insurance should cover these liquid meds as they covered the pill form. Maybe we need to do a PA for them.

## 2018-11-29 NOTE — Telephone Encounter (Signed)
[facility] does not have these, will send to [facility] for patient

## 2018-11-29 NOTE — Addendum Note (Signed)
Addended by: Antonietta Barcelona D on: 11/29/2018 05:04 PM   Modules accepted: Orders

## 2018-11-29 NOTE — Telephone Encounter (Signed)
Fax from [facility] for meform HCI 500 mg/64ml SRER Note from pharmacy med only comes in IR 500 mg liquid and is not covered  Losartan Potassium Powder Not covered  Omeprazole(first-omeprazole) 2mg /ml suspension Not covered by pt's insurance - cost is $450  Please advise

## 2018-12-08 ENCOUNTER — Telehealth: Payer: Self-pay | Admitting: *Deleted

## 2018-12-08 NOTE — Telephone Encounter (Addendum)
Prior Auth for metFORMIN HCl 500MG /5ML solution -APPROVED till 01/06/20   Key: [id number] -   PA Case ID: [id number]  Pharmacy aware.

## 2018-12-14 DIAGNOSIS — Z7984 Long term (current) use of oral hypoglycemic drugs: Secondary | ICD-10-CM | POA: Diagnosis not present

## 2018-12-14 DIAGNOSIS — K222 Esophageal obstruction: Secondary | ICD-10-CM | POA: Diagnosis not present

## 2018-12-14 DIAGNOSIS — J449 Chronic obstructive pulmonary disease, unspecified: Secondary | ICD-10-CM | POA: Diagnosis not present

## 2018-12-14 DIAGNOSIS — G4730 Sleep apnea, unspecified: Secondary | ICD-10-CM | POA: Diagnosis not present

## 2018-12-14 DIAGNOSIS — I10 Essential (primary) hypertension: Secondary | ICD-10-CM | POA: Diagnosis not present

## 2018-12-14 DIAGNOSIS — E119 Type 2 diabetes mellitus without complications: Secondary | ICD-10-CM | POA: Diagnosis not present

## 2018-12-14 DIAGNOSIS — K219 Gastro-esophageal reflux disease without esophagitis: Secondary | ICD-10-CM | POA: Diagnosis not present

## 2018-12-14 DIAGNOSIS — Z85828 Personal history of other malignant neoplasm of skin: Secondary | ICD-10-CM | POA: Diagnosis not present

## 2018-12-14 DIAGNOSIS — Z9989 Dependence on other enabling machines and devices: Secondary | ICD-10-CM | POA: Diagnosis not present

## 2018-12-14 DIAGNOSIS — E785 Hyperlipidemia, unspecified: Secondary | ICD-10-CM | POA: Diagnosis not present

## 2018-12-14 DIAGNOSIS — I2510 Atherosclerotic heart disease of native coronary artery without angina pectoris: Secondary | ICD-10-CM | POA: Diagnosis not present

## 2018-12-17 DIAGNOSIS — G4733 Obstructive sleep apnea (adult) (pediatric): Secondary | ICD-10-CM | POA: Diagnosis not present

## 2018-12-23 ENCOUNTER — Other Ambulatory Visit: Payer: Self-pay | Admitting: Cardiovascular Disease

## 2018-12-28 DIAGNOSIS — K219 Gastro-esophageal reflux disease without esophagitis: Secondary | ICD-10-CM | POA: Diagnosis not present

## 2018-12-28 DIAGNOSIS — G4730 Sleep apnea, unspecified: Secondary | ICD-10-CM | POA: Diagnosis not present

## 2018-12-28 DIAGNOSIS — I10 Essential (primary) hypertension: Secondary | ICD-10-CM | POA: Diagnosis not present

## 2018-12-28 DIAGNOSIS — E119 Type 2 diabetes mellitus without complications: Secondary | ICD-10-CM | POA: Diagnosis not present

## 2018-12-28 DIAGNOSIS — E785 Hyperlipidemia, unspecified: Secondary | ICD-10-CM | POA: Diagnosis not present

## 2018-12-28 DIAGNOSIS — K449 Diaphragmatic hernia without obstruction or gangrene: Secondary | ICD-10-CM | POA: Diagnosis not present

## 2018-12-28 DIAGNOSIS — I2510 Atherosclerotic heart disease of native coronary artery without angina pectoris: Secondary | ICD-10-CM | POA: Diagnosis not present

## 2018-12-28 DIAGNOSIS — K222 Esophageal obstruction: Secondary | ICD-10-CM | POA: Diagnosis not present

## 2018-12-28 DIAGNOSIS — Z85828 Personal history of other malignant neoplasm of skin: Secondary | ICD-10-CM | POA: Diagnosis not present

## 2018-12-28 DIAGNOSIS — R1310 Dysphagia, unspecified: Secondary | ICD-10-CM | POA: Diagnosis not present

## 2018-12-28 DIAGNOSIS — J449 Chronic obstructive pulmonary disease, unspecified: Secondary | ICD-10-CM | POA: Diagnosis not present

## 2019-01-12 ENCOUNTER — Telehealth: Payer: Self-pay | Admitting: Cardiovascular Disease

## 2019-01-12 NOTE — Telephone Encounter (Signed)

## 2019-01-14 ENCOUNTER — Telehealth (INDEPENDENT_AMBULATORY_CARE_PROVIDER_SITE_OTHER): Payer: Medicare HMO | Admitting: Cardiovascular Disease

## 2019-01-14 ENCOUNTER — Encounter: Payer: Self-pay | Admitting: Cardiovascular Disease

## 2019-01-14 VITALS — BP 106/64 | HR 72 | Ht 62.0 in | Wt 125.0 lb

## 2019-01-14 DIAGNOSIS — E119 Type 2 diabetes mellitus without complications: Secondary | ICD-10-CM

## 2019-01-14 DIAGNOSIS — C159 Malignant neoplasm of esophagus, unspecified: Secondary | ICD-10-CM

## 2019-01-14 DIAGNOSIS — I25118 Atherosclerotic heart disease of native coronary artery with other forms of angina pectoris: Secondary | ICD-10-CM

## 2019-01-14 DIAGNOSIS — I10 Essential (primary) hypertension: Secondary | ICD-10-CM

## 2019-01-14 DIAGNOSIS — J449 Chronic obstructive pulmonary disease, unspecified: Secondary | ICD-10-CM

## 2019-01-14 DIAGNOSIS — E785 Hyperlipidemia, unspecified: Secondary | ICD-10-CM

## 2019-01-14 MED ORDER — NITROGLYCERIN 0.4 MG SL SUBL: 0.4000 mg | SUBLINGUAL_TABLET | SUBLINGUAL | 3 refills | Status: DC | PRN

## 2019-01-14 NOTE — Addendum Note (Signed)
Addended by: Julian Hy T on: 01/14/2019 01:54 PM   Modules accepted: Orders

## 2019-01-14 NOTE — Patient Instructions (Signed)
Your physician wants you to follow-up in: [place] will receive a reminder letter in the mail two months in advance. If you don't receive a letter, please call our office to schedule the follow-up appointment.  Your physician recommends that you continue on your current medications as directed. Please refer to the Current Medication list given to you today.  WE HAVE REFILLED NITROGLYCERIN   Thank you for choosing [facility]!!

## 2019-01-14 NOTE — Progress Notes (Signed)
Virtual Visit via Telephone Note   This visit type was conducted due to national recommendations for restrictions regarding the COVID-19 Pandemic (e.g. social distancing) in an effort to limit this patient's exposure and mitigate transmission in our community.  Due to her co-morbid illnesses, this patient is at least at moderate risk for complications without adequate follow up.  This format is felt to be most appropriate for this patient at this time.  The patient did not have access to video technology/had technical difficulties with video requiring transitioning to audio format only (telephone).  All issues noted in this document were discussed and addressed.  No physical exam could be performed with this format.  Please refer to the patient's chart for her  consent to telehealth for [facility].   Date:  01/14/2019   ID:  Erica [facility], DOB September 14, 1950, MRN [id number]  Patient Location: Home Provider Location: Home  PCP:  Baruch Gouty, [name]  Cardiologist:  Kate Sable, MD  Electrophysiologist:  None   Evaluation Performed:  Follow-Up Visit  Chief Complaint:  CAD.  History of Present Illness:    Erica Braun is a 69 y.o. female with CAD and severe COPD.  In summary, cardiac catheterization on 06/11/2017 demonstrated chronic total occlusion of the mid RCA collateralized from the LAD and circumflex. The left main was widely patent. There is a 30% irregularity in the proximal to mid LAD. There was a large first diagonal branch with ostial 60% narrowing and mid vessel diffuse 70% stenosis. Left ventriculography demonstrated inferobasal moderate hypokinesis, LVEF 55%. LVEDP was normal.  Imdur 30 mg was prescribed after coronary angiography. It was mentioned that if medical therapy could not control symptoms, she should be referred to our CTO team.  She has undergone esophageal dilatations at [facility] as well as endoscopic submucosal dissection of the  esophagus for high grade squamous cell carcinoma.  She has no anginal symptoms. She is able to go up and down stairs without chest pain or dyspnea.   She is now able to drink liquids and take pills.   Past Medical History:  Diagnosis Date  . Complication of anesthesia   . COPD (chronic obstructive pulmonary disease) ([place])   . Coronary artery disease   . Diabetes mellitus without complication ([facility])   . Diverticulosis   . Early cataracts, bilateral 11/21/2016  . Emphysema of lung ([place])   . GERD (gastroesophageal reflux disease)   . Glaucoma   . Hyperlipidemia   . Hypertension   . Internal hemorrhoids   . Pneumonia   . PONV (postoperative nausea and vomiting)   . Sleep apnea    uses Cpap  . Spasm of the cricopharyngeus muscle   . Squamous cell esophageal cancer ([facility]) 05/10/2018   Past Surgical History:  Procedure Laterality Date  . ABDOMINAL HYSTERECTOMY     cervical dysplasia  . BIOPSY  05/03/2018   Procedure: BIOPSY;  Surgeon: Rush Landmark Telford Nab., MD;  Location: [place];  Service: Gastroenterology;;  . BREAST BIOPSY Left   . BREAST LUMPECTOMY Left   . ESOPHAGOGASTRODUODENOSCOPY (EGD) WITH PROPOFOL N/A 05/03/2018   Procedure: ESOPHAGOGASTRODUODENOSCOPY (EGD) WITH PROPOFOL;  Surgeon: Rush Landmark Telford Nab., MD;  Location: [place];  Service: Gastroenterology;  Laterality: N/A;  . ESOPHAGOGASTRODUODENOSCOPY (EGD) WITH PROPOFOL N/A 11/18/2018   Procedure: ESOPHAGOGASTRODUODENOSCOPY (EGD) WITH PROPOFOL;  Surgeon: Irene Shipper, MD;  [facility];  Service: Endoscopy;  Laterality: N/A;  . EUS  05/03/2018   Procedure: UPPER ENDOSCOPIC ULTRASOUND (EUS) RADIAL;  Surgeon: Irving Copas., MD;  Location: [place];  Service: Gastroenterology;;  . FOREIGN BODY REMOVAL  11/18/2018   Procedure: FOREIGN BODY REMOVAL;  Surgeon: Irene Shipper, MD;  Location: [facility];  Service: Endoscopy;;  . LEFT HEART CATH AND CORONARY ANGIOGRAPHY N/A 06/11/2017    Procedure: LEFT HEART CATH AND CORONARY ANGIOGRAPHY;  Surgeon: Belva Crome, MD;  Location: [facility];  Service: Cardiovascular;  Laterality: N/A;     Current Meds  Medication Sig  . albuterol (PROVENTIL) (2.5 MG/3ML) 0.083% nebulizer solution Take 3 mLs (2.5 mg total) every 6 (six) hours as needed by nebulization for wheezing or shortness of breath.  Jearl Klinefelter ELLIPTA 62.5-25 MCG/INH AEPB Inhale 1 puff into the lungs daily.   Marland Kitchen aspirin EC 81 MG tablet Take 81 mg by mouth daily.  Marland Kitchen atorvastatin (LIPITOR) 40 MG tablet Take 1 tablet by mouth once daily  . Blood Glucose Monitor Software DEVI Test BS BID and PRN Dx E11.9  . clotrimazole-betamethasone (LOTRISONE) cream Apply 1 application topically 2 (two) times daily. To affected areas until rash clears  . glucose blood test strip test BS BID Dx E11.9  . isosorbide mononitrate (IMDUR) 30 MG 24 hr tablet Take 1 tablet (30 mg total) by mouth 2 (two) times daily. (Patient taking differently: Take 30 mg by mouth daily. )  . Lancets (ONETOUCH ULTRASOFT) lancets Test BS BID Dx E11.9  . losartan (COZAAR) 25 MG tablet Take 1 tablet (25 mg total) by mouth daily.  . metFORMIN (GLUCOPHAGE) 1000 MG tablet Take 1 tablet (1,000 mg total) by mouth 2 (two) times daily with a meal.  . metoprolol tartrate (LOPRESSOR) 50 MG tablet Take 1 tablet by mouth twice daily  . Multiple Vitamins-Minerals (MULTIVITAMIN PO) Take 1 tablet by mouth daily.  . nitroGLYCERIN (NITROSTAT) 0.4 MG SL tablet Place 1 tablet (0.4 mg total) under the tongue every 5 (five) minutes as needed for chest pain.  Marland Kitchen omeprazole (PRILOSEC) 40 MG capsule Take 1 capsule (40 mg total) by mouth 2 (two) times daily before a meal.  . [DISCONTINUED] CALCIUM PO Take 1 tablet by mouth daily.  . [DISCONTINUED] glimepiride (AMARYL) 2 MG tablet Take 1 tablet (2 mg total) by mouth daily with breakfast.     Allergies:   Patient has no known allergies.   Social History   Tobacco Use  . Smoking  status: Former Smoker    Packs/day: 1.50    Years: 41.00    Pack years: 61.50    Types: Cigarettes    Start date: 1969    Quit date: 2010    Years since quitting: 11.0  . Smokeless tobacco: Never Used  Substance Use Topics  . Alcohol use: No  . Drug use: No     Family Hx: The patient's family history includes Alcohol abuse in her father, maternal grandfather, and paternal aunt; Arthritis in her mother; COPD in her paternal aunt; COPD (age of onset: 87) in her father; Cancer in her maternal grandmother, mother, and sister; Diabetes in her father and maternal grandmother; Drug abuse in her brother; Early death in her brother and maternal grandfather; Heart disease in her paternal grandfather; Stroke in her paternal grandmother. There is no history of Esophageal cancer or Stomach cancer.  ROS:   Please see the history of present illness.     All other systems reviewed and are negative.   Prior CV studies:   The following studies were reviewed today:  Reviewed above  Labs/Other  Tests and Data Reviewed:    EKG:  No ECG reviewed.  Recent Labs: 04/05/2018: ALT 16 11/18/2018: BUN 10; Creatinine, Ser 0.69; Hemoglobin 11.9; Platelets 130; Potassium 3.5; Sodium 140   Recent Lipid Panel [facility] Results  Component Value Date/Time   CHOL 159 03/23/2018 09:54 AM   TRIG 206 (H) 03/23/2018 09:54 AM   HDL 41 03/23/2018 09:54 AM   CHOLHDL 3.9 03/23/2018 09:54 AM   CHOLHDL 3.6 10/30/2017 10:00 AM   LDLCALC 77 03/23/2018 09:54 AM   LDLCALC 112 (H) 11/21/2016 10:12 AM    Wt Readings from Last 3 Encounters:  01/14/19 125 lb (56.7 kg)  11/18/18 133 lb (60.3 kg)  05/11/18 158 lb 12.8 oz (72 kg)     Objective:    Vital Signs:  BP 106/64   Pulse 72   Ht 5\' 2"  (1.575 m)   Wt 125 lb (56.7 kg)   SpO2 98%   BMI 22.86 kg/m    VITAL SIGNS:  reviewed  ASSESSMENT & PLAN:    1. Coronary artery disease: Symptomatically stable.  Coronary angiography results detailed above. Currently on  aspirin 81 mg, Lipitor 40 mg, Imdur 30 mg daily, losartan 25 mg, and metoprolol50mg  twice daily. No changes to therapy.  2. Hypertension: Blood pressure is normal.No changes.  3. Type 2 diabetes mellitus: Maintained on metformin and Amaryl. She is also on Lipitor 40 mg.  4. Hyperlipidemia: Lipids reviewed above.  Continue atorvastatin 40 mg.  5. COPD: Symptoms are stable.  Managed by pulmonary.   6. High grade squamous cell carcinoma of the esophagus: Managed at [facility]. She has undergone esophageal dilatations at [facility] as well as endoscopic submucosal dissection of the esophagus for high grade squamous cell carcinoma.   COVID-19 Education: The signs and symptoms of COVID-19 were discussed with the patient and how to seek care for testing (follow up with PCP or arrange E-visit).  The importance of social distancing was discussed today.  Time:   Today, I have spent 25 minutes with the patient with telehealth technology discussing the above problems.     Medication Adjustments/Labs and Tests Ordered: Current medicines are reviewed at length with the patient today.  Concerns regarding medicines are outlined above.   Tests Ordered: No orders of the defined types were placed in this encounter.   Medication Changes: No orders of the defined types were placed in this encounter.   Follow Up:  Virtual Visit  in 1 year(s)  Signed, Kate Sable, MD  01/14/2019 1:22 PM    [facility]

## 2019-01-17 DIAGNOSIS — G4733 Obstructive sleep apnea (adult) (pediatric): Secondary | ICD-10-CM | POA: Diagnosis not present

## 2019-01-25 ENCOUNTER — Encounter: Payer: Self-pay | Admitting: Family Medicine

## 2019-01-25 ENCOUNTER — Ambulatory Visit (INDEPENDENT_AMBULATORY_CARE_PROVIDER_SITE_OTHER): Payer: Medicare HMO | Admitting: Family Medicine

## 2019-01-25 ENCOUNTER — Other Ambulatory Visit: Payer: Self-pay

## 2019-01-25 VITALS — BP 120/62 | HR 69 | Temp 97.1°F | Resp 20 | Ht 62.0 in | Wt 133.0 lb

## 2019-01-25 DIAGNOSIS — N3001 Acute cystitis with hematuria: Secondary | ICD-10-CM

## 2019-01-25 DIAGNOSIS — R300 Dysuria: Secondary | ICD-10-CM | POA: Diagnosis not present

## 2019-01-25 LAB — MICROSCOPIC EXAMINATION: WBC, UA: >30 /hpf — AB (ref 0–5)

## 2019-01-25 LAB — URINALYSIS, COMPLETE
Bilirubin, UA: NEGATIVE
Glucose, UA: NEGATIVE
Ketones, UA: NEGATIVE
Nitrite, UA: NEGATIVE
Protein,UA: NEGATIVE
Specific Gravity, UA: 1.01 (ref 1.005–1.030)
Urobilinogen, Ur: 0.2 mg/dL (ref 0.2–1.0)
pH, UA: 5.5 (ref 5.0–7.5)

## 2019-01-25 MED ORDER — CIPROFLOXACIN HCL 500 MG PO TABS: 500.0000 mg | ORAL_TABLET | Freq: Two times a day (BID) | ORAL | 0 refills | Status: DC

## 2019-01-25 NOTE — Patient Instructions (Signed)

## 2019-01-25 NOTE — Progress Notes (Signed)
[id number], [facility], [name] Chief Complaint  Patient presents with  . Dysuria    Current Issues:  Presents with 2 days of dysuria, urinary urgency and urinary frequency Associated symptoms include:  lower abdominal pressure after voiding  There is no history of of similar symptoms. Sexually active:  No   No concern for STI.  Prior to Admission medications   Medication Sig Start Date End Date Taking? Authorizing Provider  albuterol (PROVENTIL) (2.5 MG/3ML) 0.083% nebulizer solution Take 3 mLs (2.5 mg total) every 6 (six) hours as needed by nebulization for wheezing or shortness of breath. 11/21/16  Yes Raylene Everts, MD  ANORO ELLIPTA 62.5-25 MCG/INH AEPB Inhale 1 puff into the lungs daily.  03/18/18  Yes [provider]  aspirin EC 81 MG tablet Take 81 mg by mouth daily.   Yes [provider]  atorvastatin (LIPITOR) 40 MG tablet Take 1 tablet by mouth once daily 07/29/18  Yes Herminio Commons, MD  Blood Glucose Monitor Software DEVI Test BS BID and PRN Dx E11.9 03/31/18  Yes Darik Massing, Erica [facility]itt, [name]  Cholecalciferol (VITAMIN D3) 2000 units TABS Take 4,000 Units by mouth daily.   Yes [provider]  clotrimazole-betamethasone (LOTRISONE) cream Apply 1 application topically 2 (two) times daily. To affected areas until rash clears 08/30/18  Yes Stacks, Cletus Gash, MD  glucose blood test strip test BS BID Dx E11.9 03/30/18  Yes Shantice Menger, Erica [facility]itt, [name]  isosorbide mononitrate (IMDUR) 30 MG 24 hr tablet Take 1 tablet (30 mg total) by mouth 2 (two) times daily. Patient taking differently: Take 30 mg by mouth daily.  07/22/17 01/25/19 Yes Herminio Commons, MD  Lancets [facility] ULTRASOFT) lancets Test BS BID Dx E11.9 03/30/18  Yes Arzella Rehmann, [facility]a [facility]itt, [name]  losartan (COZAAR) 25 MG tablet Take 1 tablet (25 mg total) by mouth daily. 03/23/18  Yes Kamaree Wheatley, Erica Burkitt, [name]  metFORMIN (GLUCOPHAGE) 1000 MG tablet Take 1 tablet (1,000 mg total) by mouth 2 (two) times daily with a meal. 03/23/18   Yes Ronny Korff, Erica Burkitt, [name]  metoprolol tartrate (LOPRESSOR) 50 MG tablet Take 1 tablet by mouth twice daily 12/24/18  Yes Herminio Commons, MD  Multiple Vitamins-Minerals (MULTIVITAMIN PO) Take 1 tablet by mouth daily.   Yes [provider]  omeprazole (PRILOSEC) 40 MG capsule Take 1 capsule (40 mg total) by mouth 2 (two) times daily before a meal. 05/03/18 01/25/19 Yes Mansouraty, Telford Nab., MD  ciprofloxacin (CIPRO) 500 MG tablet Take 1 tablet (500 mg total) by mouth 2 (two) times daily. 01/25/19   Baruch Gouty, [name]  nitroGLYCERIN (NITROSTAT) 0.4 MG SL tablet Place 1 tablet (0.4 mg total) under the tongue every 5 (five) minutes as needed for chest pain. Patient not taking: Reported on 01/25/2019 01/14/19 01/14/20  Herminio Commons, MD    Review of Systems  Constitutional: Negative for chills, diaphoresis, malaise/fatigue and weight loss.  Respiratory: Negative for cough.   Cardiovascular: Negative for chest pain.  Gastrointestinal: Positive for abdominal pain (lower abdominal pressure with voiding).  Genitourinary: Positive for dysuria, frequency and urgency. Negative for flank pain and hematuria.  Musculoskeletal: Negative for back pain.  Neurological: Negative for weakness.  All other systems reviewed and are negative.    PE:  BP 120/62   Pulse 69   Temp (!) 97.1 F (36.2 C)   Resp 20   Ht 5\' 2"  (1.575 m)   Wt 133 lb (60.3 kg)   SpO2 100%   BMI  24.33 kg/m   Physical Exam  Constitutional: She is oriented to person, place, and time and well-developed, well-nourished, and in no distress.  HENT:  Head: Normocephalic and atraumatic.  Eyes: Pupils are equal, round, and reactive to light.  Cardiovascular: Normal rate and regular rhythm.  Pulmonary/Chest: Effort normal and breath sounds normal.  Abdominal: Soft. Bowel sounds are normal. She exhibits no distension and no mass. There is no abdominal tenderness. There is no rebound, no guarding and no CVA tenderness.    Musculoskeletal:     Cervical back: Neck supple.  Neurological: She is alert and oriented to person, place, and time. Gait normal.  Skin: Skin is warm and dry.  Psychiatric: Mood, memory, affect and judgment normal.  Nursing note and vitals reviewed.   Results for orders placed or performed in visit on 01/25/19  Microscopic Examination   URINE  Result Value Ref Range   WBC, UA >30 (A) 0 - 5 /hpf   RBC 11-30 (A) 0 - 2 /hpf   Epithelial Cells (non renal) 0-10 0 - 10 /hpf   Renal Epithel, UA 0-10 (A) None seen /hpf   Bacteria, UA Many (A) None seen/Few  Urinalysis, Complete  Result Value Ref Range   Specific Gravity, UA 1.010 1.005 - 1.030   pH, UA 5.5 5.0 - 7.5   Color, UA Amber (A) Yellow   Appearance Ur Cloudy (A) Clear   Leukocytes,UA 1+ (A) Negative   Protein,UA Negative Negative/Trace   Glucose, UA Negative Negative   Ketones, UA Negative Negative   RBC, UA 3+ (A) Negative   Bilirubin, UA Negative Negative   Urobilinogen, Ur 0.2 0.2 - 1.0 mg/dL   Nitrite, UA Negative Negative   Microscopic Examination See below:    Urinalysis in office: many bacteria, + leukocytes, 3+ blood, negative nitrites.   Assessment and Plan: [facility] was seen today for dysuria.  Diagnoses and all orders for this visit:  Dysuria Urinalysis indicative of acute cystitis with hematuria. Culture ordered.  -     Urine Culture -     Urinalysis, Complete -     Microscopic Examination  Acute cystitis with hematuria Urinalysis indicative of acute cystitis with hematuria. No red flags concerning for acute pyelonephritis. Will initiate below based on previous urine cultures. Will change if culture warrants. Symptomatic care discussed in detail. Repeat urinalysis in 3 to 4 weeks.  -     ciprofloxacin (CIPRO) 500 MG tablet; Take 1 tablet (500 mg total) by mouth 2 (two) times daily.  Return in about 4 weeks (around 02/22/2019), or if symptoms worsen or fail to improve, for urine recheck.  The above  assessment and management plan was discussed with the patient. The patient verbalized understanding of and has agreed to the management plan. Patient is aware to call the clinic if they develop any new symptoms or if symptoms fail to improve or worsen. Patient is aware when to return to the clinic for a follow-up visit. Patient educated on when it is appropriate to go to the emergency department.   Monia Pouch, [name]-C [facility] [address] [place], [place] [address] [phone]

## 2019-01-27 LAB — URINE CULTURE

## 2019-02-01 ENCOUNTER — Encounter: Payer: Self-pay | Admitting: Family

## 2019-02-01 ENCOUNTER — Ambulatory Visit (INDEPENDENT_AMBULATORY_CARE_PROVIDER_SITE_OTHER): Payer: Medicare HMO | Admitting: Family

## 2019-02-01 ENCOUNTER — Other Ambulatory Visit: Payer: Self-pay

## 2019-02-01 ENCOUNTER — Telehealth: Payer: Self-pay | Admitting: Family Medicine

## 2019-02-01 VITALS — BP 124/76 | HR 78 | Temp 96.2°F | Ht 62.0 in | Wt 131.6 lb

## 2019-02-01 DIAGNOSIS — R300 Dysuria: Secondary | ICD-10-CM

## 2019-02-01 DIAGNOSIS — N3001 Acute cystitis with hematuria: Secondary | ICD-10-CM | POA: Diagnosis not present

## 2019-02-01 LAB — URINALYSIS, COMPLETE
Bilirubin, UA: NEGATIVE
Glucose, UA: NEGATIVE
Ketones, UA: NEGATIVE
Nitrite, UA: NEGATIVE
Protein,UA: NEGATIVE
Specific Gravity, UA: 1.015 (ref 1.005–1.030)
Urobilinogen, Ur: 0.2 mg/dL (ref 0.2–1.0)
pH, UA: 6 (ref 5.0–7.5)

## 2019-02-01 LAB — MICROSCOPIC EXAMINATION
Renal Epithel, UA: NONE SEEN /hpf
WBC, UA: >30 /hpf — AB (ref 0–5)

## 2019-02-01 MED ORDER — CEPHALEXIN 500 MG PO CAPS: 500.0000 mg | ORAL_CAPSULE | Freq: Two times a day (BID) | ORAL | 0 refills | Status: DC

## 2019-02-01 MED ORDER — CEFTRIAXONE SODIUM 1 G IJ SOLR
1.0000 g | Freq: Once | INTRAMUSCULAR | Status: AC
  Administered 2019-02-01: 1 g via INTRAMUSCULAR

## 2019-02-01 NOTE — Telephone Encounter (Signed)
Pt called stating that she was recently seen and treated for UTI. Pt stopped taking the antibiotic last Thursday or friday as told to do so by doctor. Now says she is having back and side pain from shoulder blade to lower back on the left side. Says the pain is throbbing, sore, and feels almost like a bruise. Wants advise on what to do.

## 2019-02-01 NOTE — Patient Instructions (Signed)
Urinary Tract Infection, Adult A urinary tract infection (UTI) is an infection of any part of the urinary tract. The urinary tract includes:  The kidneys.  The ureters.  The bladder.  The urethra. These organs make, store, and get rid of pee (urine) in the body. What are the causes? This is caused by germs (bacteria) in your genital area. These germs grow and cause swelling (inflammation) of your urinary tract. What increases the risk? You are more likely to develop this condition if:  You have a small, thin tube (catheter) to drain pee.  You cannot control when you pee or poop (incontinence).  You are female, and: ? You use these methods to prevent pregnancy:  A medicine that kills sperm (spermicide).  A device that blocks sperm (diaphragm). ? You have low levels of a female hormone (estrogen). ? You are pregnant.  You have genes that add to your risk.  You are sexually active.  You take antibiotic medicines.  You have trouble peeing because of: ? A prostate that is bigger than normal, if you are female. ? A blockage in the part of your body that drains pee from the bladder (urethra). ? A kidney stone. ? A nerve condition that affects your bladder (neurogenic bladder). ? Not getting enough to drink. ? Not peeing often enough.  You have other conditions, such as: ? Diabetes. ? A weak disease-fighting system (immune system). ? Sickle cell disease. ? Gout. ? Injury of the spine. What are the signs or symptoms? Symptoms of this condition include:  Needing to pee right away (urgently).  Peeing often.  Peeing small amounts often.  Pain or burning when peeing.  Blood in the pee.  Pee that smells bad or not like normal.  Trouble peeing.  Pee that is cloudy.  Fluid coming from the vagina, if you are female.  Pain in the belly or lower back. Other symptoms include:  Throwing up (vomiting).  No urge to eat.  Feeling mixed up (confused).  Being tired  and grouchy (irritable).  A fever.  Watery poop (diarrhea). How is this treated? This condition may be treated with:  Antibiotic medicine.  Other medicines.  Drinking enough water. Follow these instructions at home:  Medicines  Take over-the-counter and prescription medicines only as told by your doctor.  If you were prescribed an antibiotic medicine, take it as told by your doctor. Do not stop taking it even if you start to feel better. General instructions  Make sure you: ? Pee until your bladder is empty. ? Do not hold pee for a long time. ? Empty your bladder after sex. ? Wipe from front to back after pooping if you are a female. Use each tissue one time when you wipe.  Drink enough fluid to keep your pee pale yellow.  Keep all follow-up visits as told by your doctor. This is important. Contact a doctor if:  You do not get better after 1-2 days.  Your symptoms go away and then come back. Get help right away if:  You have very bad back pain.  You have very bad pain in your lower belly.  You have a fever.  You are sick to your stomach (nauseous).  You are throwing up. Summary  A urinary tract infection (UTI) is an infection of any part of the urinary tract.  This condition is caused by germs in your genital area.  There are many risk factors for a UTI. These include having a small, thin   tube to drain pee and not being able to control when you pee or poop.  Treatment includes antibiotic medicines for germs.  Drink enough fluid to keep your pee pale yellow. This information is not intended to replace advice given to you by your health care provider. Make sure you discuss any questions you have with your health care provider. Document Revised: 12/10/2017 Document Reviewed: 07/02/2017 Elsevier Patient Education  2020 Elsevier Inc.  

## 2019-02-01 NOTE — Telephone Encounter (Signed)
Patient aware and appt made 

## 2019-02-01 NOTE — Telephone Encounter (Signed)
That doesn't sound like a UTI. Have her come for  office appt. WS

## 2019-02-01 NOTE — Progress Notes (Signed)
Subjective:    Patient ID: [facility], female    DOB: 1950/01/25, 69 y.o.   MRN: [id number]  Chief Complaint  Patient presents with  . Urinary Tract Infection    dysuria, LBP   Pt presents to the office today for recurrent UTI. She was seen on 01/25/19 with UTI symptoms. She was started on cipro, but her urine culture was negative and called and told to stop medication. She reports her urine symptoms improved for one or two days.   Dysuria  This is a recurrent problem. The current episode started 1 to 4 weeks ago. The problem occurs every urination. The problem has been gradually worsening. The quality of the pain is described as burning. The pain is at a severity of 10/10 (discomfort when urinating ). The pain is mild. Associated symptoms include flank pain (left), frequency and urgency. Pertinent negatives include no discharge, hematuria, hesitancy, nausea or vomiting. She has tried increased fluids for the symptoms. The treatment provided mild relief.      Review of Systems  Gastrointestinal: Negative for nausea and vomiting.  Genitourinary: Positive for dysuria, flank pain (left), frequency and urgency. Negative for hematuria and hesitancy.  All other systems reviewed and are negative.      Objective:   Physical Exam Vitals reviewed.  Constitutional:      General: She is not in acute distress.    Appearance: She is well-developed.  HENT:     Head: Normocephalic and atraumatic.  Eyes:     Pupils: Pupils are equal, round, and reactive to light.  Neck:     Thyroid: No thyromegaly.  Cardiovascular:     Rate and Rhythm: Normal rate and regular rhythm.     Heart sounds: Normal heart sounds. No murmur.  Pulmonary:     Effort: Pulmonary effort is normal. No respiratory distress.     Breath sounds: Normal breath sounds. No wheezing.  Abdominal:     General: Bowel sounds are normal. There is no distension.     Palpations: Abdomen is soft.     Tenderness: There is  no abdominal tenderness. There is left CVA tenderness (mild).  Musculoskeletal:        General: No tenderness. Normal range of motion.     Cervical back: Normal range of motion and neck supple.  Skin:    General: Skin is warm and dry.  Neurological:     Mental Status: She is alert and oriented to person, place, and time.     Cranial Nerves: No cranial nerve deficit.     Deep Tendon Reflexes: Reflexes are normal and symmetric.  Psychiatric:        Behavior: Behavior normal.        Thought Content: Thought content normal.        Judgment: Judgment normal.     BP 124/76   Pulse 78   Temp (!) 96.2 F (35.7 C) (Temporal)   Ht 5\' 2"  (1.575 m)   Wt 131 lb 9.6 oz (59.7 kg)   SpO2 100%   BMI 24.07 kg/m        Assessment & Plan:  [facility] comes in today with chief complaint of Urinary Tract Infection (dysuria, LBP)   Diagnosis and orders addressed:  1. Dysuria - Urinalysis, Complete  2. Acute cystitis with hematuria Force fluids AZO over the counter X2 days RTO prn Culture pending - Urine Culture - cephALEXin (KEFLEX) 500 MG capsule; Take 1 capsule (500 mg total) by mouth  2 (two) times daily.  Dispense: 14 capsule; Refill: 0 - cefTRIAXone (ROCEPHIN) injection 1 g   Evelina Dun, [name]

## 2019-02-03 DIAGNOSIS — I10 Essential (primary) hypertension: Secondary | ICD-10-CM | POA: Diagnosis not present

## 2019-02-03 DIAGNOSIS — K449 Diaphragmatic hernia without obstruction or gangrene: Secondary | ICD-10-CM | POA: Diagnosis not present

## 2019-02-03 DIAGNOSIS — J449 Chronic obstructive pulmonary disease, unspecified: Secondary | ICD-10-CM | POA: Diagnosis not present

## 2019-02-03 DIAGNOSIS — E785 Hyperlipidemia, unspecified: Secondary | ICD-10-CM | POA: Diagnosis not present

## 2019-02-03 DIAGNOSIS — R1310 Dysphagia, unspecified: Secondary | ICD-10-CM | POA: Diagnosis not present

## 2019-02-03 DIAGNOSIS — K208 Other esophagitis without bleeding: Secondary | ICD-10-CM | POA: Diagnosis not present

## 2019-02-03 DIAGNOSIS — G4730 Sleep apnea, unspecified: Secondary | ICD-10-CM | POA: Diagnosis not present

## 2019-02-03 DIAGNOSIS — K228 Other specified diseases of esophagus: Secondary | ICD-10-CM | POA: Diagnosis not present

## 2019-02-03 DIAGNOSIS — I2510 Atherosclerotic heart disease of native coronary artery without angina pectoris: Secondary | ICD-10-CM | POA: Diagnosis not present

## 2019-02-03 DIAGNOSIS — K219 Gastro-esophageal reflux disease without esophagitis: Secondary | ICD-10-CM | POA: Diagnosis not present

## 2019-02-03 DIAGNOSIS — K222 Esophageal obstruction: Secondary | ICD-10-CM | POA: Diagnosis not present

## 2019-02-03 DIAGNOSIS — E119 Type 2 diabetes mellitus without complications: Secondary | ICD-10-CM | POA: Diagnosis not present

## 2019-02-03 DIAGNOSIS — K209 Esophagitis, unspecified without bleeding: Secondary | ICD-10-CM | POA: Diagnosis not present

## 2019-02-09 LAB — URINE CULTURE

## 2019-02-11 ENCOUNTER — Other Ambulatory Visit: Payer: Self-pay | Admitting: Family

## 2019-02-12 ENCOUNTER — Other Ambulatory Visit: Payer: Self-pay | Admitting: Family

## 2019-02-12 MED ORDER — AMOXICILLIN-POT CLAVULANATE 875-125 MG PO TABS: 1.0000 | ORAL_TABLET | Freq: Two times a day (BID) | ORAL | 0 refills | Status: DC

## 2019-02-17 DIAGNOSIS — G4733 Obstructive sleep apnea (adult) (pediatric): Secondary | ICD-10-CM | POA: Diagnosis not present

## 2019-02-28 ENCOUNTER — Encounter: Payer: Self-pay | Admitting: Family Medicine

## 2019-02-28 ENCOUNTER — Other Ambulatory Visit: Payer: Self-pay

## 2019-03-01 ENCOUNTER — Ambulatory Visit (INDEPENDENT_AMBULATORY_CARE_PROVIDER_SITE_OTHER): Payer: Medicare HMO | Admitting: Physician Assistant

## 2019-03-01 ENCOUNTER — Encounter: Payer: Self-pay | Admitting: Physician Assistant

## 2019-03-01 ENCOUNTER — Other Ambulatory Visit: Payer: Self-pay

## 2019-03-01 VITALS — BP 138/71 | HR 73 | Temp 97.4°F | Ht 62.0 in | Wt 134.6 lb

## 2019-03-01 DIAGNOSIS — R300 Dysuria: Secondary | ICD-10-CM

## 2019-03-01 DIAGNOSIS — N390 Urinary tract infection, site not specified: Secondary | ICD-10-CM

## 2019-03-01 LAB — MICROSCOPIC EXAMINATION
Renal Epithel, UA: NONE SEEN /hpf
WBC, UA: >30 /hpf — AB (ref 0–5)

## 2019-03-01 LAB — URINALYSIS, COMPLETE
Bilirubin, UA: NEGATIVE
Glucose, UA: NEGATIVE
Ketones, UA: NEGATIVE
Nitrite, UA: NEGATIVE
Protein,UA: NEGATIVE
Specific Gravity, UA: 1.02 (ref 1.005–1.030)
Urobilinogen, Ur: 0.2 mg/dL (ref 0.2–1.0)
pH, UA: 5.5 (ref 5.0–7.5)

## 2019-03-01 MED ORDER — NITROFURANTOIN MONOHYD MACRO 100 MG PO CAPS: 100.0000 mg | ORAL_CAPSULE | Freq: Two times a day (BID) | ORAL | 0 refills | Status: DC

## 2019-03-01 NOTE — Progress Notes (Signed)
BP 138/71   Pulse 73   Temp (!) 97.4 F (36.3 C) (Temporal)   Ht 5\' 2"  (1.575 m)   Wt 134 lb 9.6 oz (61.1 kg)   SpO2 100%   BMI 24.62 kg/m    Subjective:    Patient ID: Erica Braun, female    DOB: [birth year], 69 y.o.   MRN: [id number]  HPI 1. Dysuria  2. Recurrent UTI   HPI: Erica Braun is a 69 y.o. female presenting on 03/01/2019 for Dysuria and Back Pain  She did have an infection a few weeks ago.  I have reviewed her chart and the culture and sensitivities.  It did state that Augmentin was sensitive however after just a few days she began with symptoms again.  She has been symptomatic over the past 3 days.  This patient has had several days of dysuria, frequency and nocturia. There is also pain over the bladder in the suprapubic region, no back pain. Denies leakage or hematuria.  Denies fever or chills. No pain in flank area. .    Past Medical History:  Diagnosis Date  . Complication of anesthesia   . COPD (chronic obstructive pulmonary disease) ([place])   . Coronary artery disease   . Diabetes mellitus without complication ([place])   . Diverticulosis   . Early cataracts, bilateral 11/21/2016  . Emphysema of lung ([place])   . GERD (gastroesophageal reflux disease)   . Glaucoma   . Hyperlipidemia   . Hypertension   . Internal hemorrhoids   . Pneumonia   . PONV (postoperative nausea and vomiting)   . Sleep apnea    uses Cpap  . Spasm of the cricopharyngeus muscle   . Squamous cell esophageal cancer ([place]) 05/10/2018   Relevant past medical, surgical, family and social history reviewed and updated as indicated. Interim medical history since our last visit reviewed. Allergies and medications reviewed and updated. DATA REVIEWED: CHART IN EPIC  Family History reviewed for pertinent findings.  Review of Systems  Constitutional: Negative.   HENT: Negative.   Eyes: Negative.   Respiratory: Negative.   Gastrointestinal: Negative.    Genitourinary: Positive for difficulty urinating, dysuria and urgency. Negative for flank pain.    Allergies as of 03/01/2019   No Known Allergies     Medication List       Accurate as of March 01, 2019  1:04 PM. If you have any questions, ask your nurse or doctor.        STOP taking these medications   amoxicillin-clavulanate 875-125 MG tablet Commonly known as: AUGMENTIN Stopped by: Terald Sleeper, PA-C     TAKE these medications   albuterol (2.5 MG/3ML) 0.083% nebulizer solution Commonly known as: PROVENTIL Take 3 mLs (2.5 mg total) every 6 (six) hours as needed by nebulization for wheezing or shortness of breath.   Anoro Ellipta 62.5-25 MCG/INH Aepb Generic drug: umeclidinium-vilanterol Inhale 1 puff into the lungs daily.   aspirin EC 81 MG tablet Take 81 mg by mouth daily.   atorvastatin 40 MG tablet Commonly known as: LIPITOR Take 1 tablet by mouth once daily   Blood Glucose Monitor Software Devi Test BS BID and PRN Dx E11.9   glucose blood test strip test BS BID Dx E11.9   isosorbide mononitrate 30 MG 24 hr tablet Commonly known as: Imdur Take 1 tablet (30 mg total) by mouth 2 (two) times daily. What changed: when to take this   losartan 25 MG tablet Commonly known as:  COZAAR Take 1 tablet (25 mg total) by mouth daily.   metFORMIN 1000 MG tablet Commonly known as: GLUCOPHAGE Take 1 tablet (1,000 mg total) by mouth 2 (two) times daily with a meal.   metoprolol tartrate 50 MG tablet Commonly known as: LOPRESSOR Take 1 tablet by mouth twice daily   MULTIVITAMIN PO Take 1 tablet by mouth daily.   nitrofurantoin (macrocrystal-monohydrate) 100 MG capsule Commonly known as: Macrobid Take 1 capsule (100 mg total) by mouth 2 (two) times daily. 1 po BId Started by: Terald Sleeper, PA-C   nitroGLYCERIN 0.4 MG SL tablet Commonly known as: Nitrostat Place 1 tablet (0.4 mg total) under the tongue every 5 (five) minutes as needed for chest pain.    omeprazole 40 MG capsule Commonly known as: PRILOSEC Take 1 capsule (40 mg total) by mouth 2 (two) times daily before a meal.   onetouch ultrasoft lancets Test BS BID Dx E11.9   Vitamin D3 50 MCG (2000 UT) Tabs Take 4,000 Units by mouth daily.          Objective:    BP 138/71   Pulse 73   Temp (!) 97.4 F (36.3 C) (Temporal)   Ht 5\' 2"  (1.575 m)   Wt 134 lb 9.6 oz (61.1 kg)   SpO2 100%   BMI 24.62 kg/m   No Known Allergies  Wt Readings from Last 3 Encounters:  03/01/19 134 lb 9.6 oz (61.1 kg)  02/01/19 131 lb 9.6 oz (59.7 kg)  01/25/19 133 lb (60.3 kg)    Physical Exam Constitutional:      General: She is not in acute distress.    Appearance: Normal appearance. She is well-developed.  HENT:     Head: Normocephalic and atraumatic.  Cardiovascular:     Rate and Rhythm: Normal rate.  Pulmonary:     Effort: Pulmonary effort is normal.  Skin:    General: Skin is warm and dry.     Findings: No rash.  Neurological:     Mental Status: She is alert and oriented to person, place, and time.     Deep Tendon Reflexes: Reflexes are normal and symmetric.     Results for orders placed or performed in visit on 02/01/19  Urine Culture   Specimen: Urine   UR  Result Value Ref Range   Urine Culture, Routine Final report (A)    Organism ID, Bacteria Escherichia coli (A)    Antimicrobial Susceptibility Comment   Microscopic Examination   URINE  Result Value Ref Range   WBC, UA >30 (A) 0 - 5 /hpf   RBC 3-10 (A) 0 - 2 /hpf   Epithelial Cells (non renal) 0-10 0 - 10 /hpf   Renal Epithel, UA None seen None seen /hpf   Bacteria, UA Few None seen/Few  Urinalysis, Complete  Result Value Ref Range   Specific Gravity, UA 1.015 1.005 - 1.030   pH, UA 6.0 5.0 - 7.5   Color, UA Yellow Yellow   Appearance Ur Clear Clear   Leukocytes,UA 1+ (A) Negative   Protein,UA Negative Negative/Trace   Glucose, UA Negative Negative   Ketones, UA Negative Negative   RBC, UA Trace (A)  Negative   Bilirubin, UA Negative Negative   Urobilinogen, Ur 0.2 0.2 - 1.0 mg/dL   Nitrite, UA Negative Negative   Microscopic Examination See below:       Assessment & Plan:   1. Dysuria - Urine Culture - Urinalysis, Complete  2. Recurrent UTI - nitrofurantoin,  macrocrystal-monohydrate, (MACROBID) 100 MG capsule; Take 1 capsule (100 mg total) by mouth 2 (two) times daily. 1 po BId  Dispense: 20 capsule; Refill: 0   Continue all other maintenance medications as listed above.  Follow up plan: Return in about 3 weeks (around 03/22/2019) for recheck with Monia Pouch.  Educational handout given for [place] PA-C [employer] [address]  [place], [place] [address] [phone]   03/01/2019, 1:04 PM

## 2019-03-04 LAB — URINE CULTURE

## 2019-03-18 ENCOUNTER — Ambulatory Visit (INDEPENDENT_AMBULATORY_CARE_PROVIDER_SITE_OTHER): Payer: Medicare HMO | Admitting: *Deleted

## 2019-03-18 VITALS — Ht 62.0 in | Wt 134.7 lb

## 2019-03-18 DIAGNOSIS — Z0000 Encounter for general adult medical examination without abnormal findings: Secondary | ICD-10-CM | POA: Diagnosis not present

## 2019-03-18 NOTE — Patient Instructions (Addendum)
[name] , Thank you for taking time to come for your Medicare Wellness Visit. I appreciate your ongoing commitment to your health goals. Please review the following plan we discussed and let me know if I can assist you in the future.   These are the goals we discussed: Goals    . Blood Pressure < 140/90    . HEMOGLOBIN A1C < 7.0       This is a list of the screening recommended for you and due dates:  Health Maintenance  Topic Date Due  . Eye exam for diabetics  03/18/2018  . Hemoglobin A1C  09/23/2018  . Pneumonia vaccines (2 of 2 - PCV13) 12/12/2018  . Complete foot exam   12/18/2018  . Tetanus Vaccine  03/23/2019*  . Mammogram  03/24/2019  . Colon Cancer Screening  03/21/2025  . Flu Shot  Completed  . DEXA scan (bone density measurement)  Completed  .  Hepatitis C: One time screening is recommended by Center for Disease Control  (CDC) for  adults born from [birth year] through 1966.   Completed  *Topic was postponed. The date shown is not the original due date.     Advance Directive  Advance directives are legal documents that let you make choices ahead of time about your health care and medical treatment in case you become unable to communicate for yourself. Advance directives are a way for you to make known your wishes to family, friends, and health care providers. This can let others know about your end-of-life care if you become unable to communicate. Discussing and writing advance directives should happen over time rather than all at once. Advance directives can be changed depending on your situation and what you want, even after you have signed the advance directives. There are different types of advance directives, such as:  Medical power of attorney.  Living will.  Do not resuscitate (DNR) or do not attempt resuscitation (DNAR) order. Health care proxy and medical power of attorney A health care proxy is also called a health care agent. This is a person who is  appointed to make medical decisions for you in cases where you are unable to make the decisions yourself. Generally, people choose someone they know well and trust to represent their preferences. Make sure to ask this person for an agreement to act as your proxy. A proxy may have to exercise judgment in the event of a medical decision for which your wishes are not known. A medical power of attorney is a legal document that names your health care proxy. Depending on the laws in your state, after the document is written, it may also need to be:  Signed.  Notarized.  Dated.  Copied.  Witnessed.  Incorporated into your medical record. You may also want to appoint someone to manage your money in a situation in which you are unable to do so. This is called a durable power of attorney for finances. It is a separate legal document from the durable power of attorney for health care. You may choose the same person or someone different from your health care proxy to act as your agent in money matters. If you do not appoint a proxy, or if there is a concern that the proxy is not acting in your best interests, a court may appoint a guardian to act on your behalf. Living will A living will is a set of instructions that state your wishes about medical care when you cannot express them  yourself. Health care providers should keep a copy of your living will in your medical record. You may want to give a copy to family members or friends. To alert caregivers in case of an emergency, you can place a card in your wallet to let them know that you have a living will and where they can find it. A living will is used if you become:  Terminally ill.  Disabled.  Unable to communicate or make decisions. Items to consider in your living will include:  To use or not to use life-support equipment, such as dialysis machines and breathing machines (ventilators).  A DNR or DNAR order. This tells health care providers not  to use cardiopulmonary resuscitation (CPR) if breathing or heartbeat stops.  To use or not to use tube feeding.  To be given or not to be given food and fluids.  Comfort (palliative) care when the goal becomes comfort rather than a cure.  Donation of organs and tissues. A living will does not give instructions for distributing your money and property if you should pass away. DNR or DNAR A DNR or DNAR order is a request not to have CPR in the event that your heart stops beating or you stop breathing. If a DNR or DNAR order has not been made and shared, a health care provider will try to help any patient whose heart has stopped or who has stopped breathing. If you plan to have surgery, talk with your health care provider about how your DNR or DNAR order will be followed if problems occur. What if I do not have an advance directive? If you do not have an advance directive, some states assign family decision makers to act on your behalf based on how closely you are related to them. Each state has its own laws about advance directives. You may want to check with your health care provider, attorney, or state representative about the laws in your state. Summary  Advance directives are the legal documents that allow you to make choices ahead of time about your health care and medical treatment in case you become unable to tell others about your care.  The process of discussing and writing advance directives should happen over time. You can change the advance directives, even after you have signed them.  Advance directives include DNR or DNAR orders, living wills, and designating an agent as your medical power of attorney. This information is not intended to replace advice given to you by your health care provider. Make sure you discuss any questions you have with your health care provider. Document Revised: 07/22/2018 Document Reviewed: 07/22/2018 Elsevier Patient Education  [place].

## 2019-03-18 NOTE — Progress Notes (Signed)
MEDICARE ANNUAL WELLNESS VISIT  03/18/2019  Telephone Visit Disclaimer This Medicare AWV was conducted by telephone due to national recommendations for restrictions regarding the COVID-19 Pandemic (e.g. social distancing).  I verified, using two identifiers, that I am speaking with Erica Braun or their authorized healthcare agent. I discussed the limitations, risks, security, and privacy concerns of performing an evaluation and management service by telephone and the potential availability of an in-person appointment in the future. The patient expressed understanding and agreed to proceed.   Subjective:  Erica Braun is a 69 y.o. female patient of Rakes, Connye Burkitt, [name] who had a Medicare Annual Wellness Visit today via telephone. Erica Braun is Retired and lives with their spouse. she has  3 children. she reports that she is socially active and does interact with friends/family regularly. she is minimally physically active and enjoys sewing.  Patient Care Team: Baruch Gouty, [name] as PCP - General (Family Medicine) Herminio Commons, MD as PCP - Cardiology (Cardiology)  Advanced Directives 05/11/2018 05/03/2018 04/08/2018 06/11/2017  Does Patient Have a Medical Advance Directive? No No No No  Would patient like information on creating a medical advance directive? No - Patient declined No - Patient declined No - Guardian declined Yes (MAU/Ambulatory/Procedural Areas - Information given)    Hospital Utilization Over the Past 12 Months: # of hospitalizations or ER visits: 1 # of surgeries: 1  Review of Systems    Patient reports that her overall health is unchanged compared to last year.  History obtained from chart review and the patient General ROS: negative  Patient Reported Readings (BP, Pulse, CBG, Weight, etc) none  Pain Assessment Pain : No/denies pain     Current Medications & Allergies (verified) Allergies as of 03/18/2019   No Known Allergies     Medication List       Accurate as of March 18, 2019 11:30 AM. If you have any questions, ask your nurse or doctor.        albuterol (2.5 MG/3ML) 0.083% nebulizer solution Commonly known as: PROVENTIL Take 3 mLs (2.5 mg total) every 6 (six) hours as needed by nebulization for wheezing or shortness of breath.   Anoro Ellipta 62.5-25 MCG/INH Aepb Generic drug: umeclidinium-vilanterol Inhale 1 puff into the lungs daily.   aspirin EC 81 MG tablet Take 81 mg by mouth daily.   atorvastatin 40 MG tablet Commonly known as: LIPITOR Take 1 tablet by mouth once daily   Blood Glucose Monitor Software Devi Test BS BID and PRN Dx E11.9   glucose blood test strip test BS BID Dx E11.9   isosorbide mononitrate 30 MG 24 hr tablet Commonly known as: Imdur Take 1 tablet (30 mg total) by mouth 2 (two) times daily. What changed: when to take this   losartan 25 MG tablet Commonly known as: COZAAR Take 1 tablet (25 mg total) by mouth daily.   metFORMIN 1000 MG tablet Commonly known as: GLUCOPHAGE Take 1 tablet (1,000 mg total) by mouth 2 (two) times daily with a meal.   metoprolol tartrate 50 MG tablet Commonly known as: LOPRESSOR Take 1 tablet by mouth twice daily   MULTIVITAMIN PO Take 1 tablet by mouth daily.   nitrofurantoin (macrocrystal-monohydrate) 100 MG capsule Commonly known as: Macrobid Take 1 capsule (100 mg total) by mouth 2 (two) times daily. 1 po BId   nitroGLYCERIN 0.4 MG SL tablet Commonly known as: Nitrostat Place 1 tablet (0.4 mg total) under the tongue every 5 (five)  minutes as needed for chest pain.   omeprazole 40 MG capsule Commonly known as: PRILOSEC Take 1 capsule (40 mg total) by mouth 2 (two) times daily before a meal.   onetouch ultrasoft lancets Test BS BID Dx E11.9   Vitamin D3 50 MCG (2000 UT) Tabs Take 4,000 Units by mouth daily.       History (reviewed): Past Medical History:  Diagnosis Date  . Complication of anesthesia   . COPD  (chronic obstructive pulmonary disease) ([place])   . Coronary artery disease   . Diabetes mellitus without complication ([place])   . Diverticulosis   . Early cataracts, bilateral 11/21/2016  . Emphysema of lung ([place])   . GERD (gastroesophageal reflux disease)   . Glaucoma   . Hyperlipidemia   . Hypertension   . Internal hemorrhoids   . Pneumonia   . PONV (postoperative nausea and vomiting)   . Sleep apnea    uses Cpap  . Spasm of the cricopharyngeus muscle   . Squamous cell esophageal cancer ([place]) 05/10/2018   Past Surgical History:  Procedure Laterality Date  . ABDOMINAL HYSTERECTOMY     cervical dysplasia  . BIOPSY  05/03/2018   Procedure: BIOPSY;  Surgeon: Rush Landmark Telford Nab., MD;  Location: [facility];  Service: Gastroenterology;;  . BREAST BIOPSY Left   . BREAST LUMPECTOMY Left   . ESOPHAGOGASTRODUODENOSCOPY (EGD) WITH PROPOFOL N/A 05/03/2018   Procedure: ESOPHAGOGASTRODUODENOSCOPY (EGD) WITH PROPOFOL;  Surgeon: Rush Landmark Telford Nab., MD;  Location: [facility];  Service: Gastroenterology;  Laterality: N/A;  . ESOPHAGOGASTRODUODENOSCOPY (EGD) WITH PROPOFOL N/A 11/18/2018   Procedure: ESOPHAGOGASTRODUODENOSCOPY (EGD) WITH PROPOFOL;  Surgeon: Irene Shipper, MD;  Location: [facility] ENDOSCOPY;  Service: Endoscopy;  Laterality: N/A;  . EUS  05/03/2018   Procedure: UPPER ENDOSCOPIC ULTRASOUND (EUS) RADIAL;  Surgeon: Rush Landmark Telford Nab., MD;  Location[facility] END[facility]COPY;  Service: Gastroenterology;;  . FOREIGN BODY REMOVAL  11/18/2018   Procedure: FOREIGN BODY REMOVAL;  Surgeon: Irene Shipper, MD;  Location: [facility] ENDOSCOPY;  Service: Endoscopy;;  . LEFT HEART CATH AND CORONARY ANGIOGRAPHY N/A 06/11/2017   Procedure: LEFT HEART CATH AND CORONARY ANGIOGRAPHY;  Surgeon: Belva Crome, MD;  Location: [facility] CV LAB;  Service: Cardiovascular;  Laterality: N/A;   Family History  Problem Relation Age of Onset  . Cancer Mother        breast  . Arthritis Mother   . COPD Father 87        emphysema  . Alcohol abuse Father   . Diabetes Father   . Cancer Sister        breast  . Early death Brother        overdose  . Drug abuse Brother   . Alcohol abuse Paternal Aunt   . COPD Paternal 5   . Cancer Maternal Grandmother        liver cancer  . Diabetes Maternal Grandmother   . Alcohol abuse Maternal Grandfather   . Early death Maternal Grandfather   . Stroke Paternal Grandmother   . Heart disease Paternal Grandfather   . Esophageal cancer Neg Hx   . Stomach cancer Neg Hx    Social History   Socioeconomic History  . Marital status: Married    Spouse name: Jenny Reichmann  . Number of children: 3  . Years of education: 29  . Highest education level: Some college, no degree  Occupational History  . Occupation: retired    Comment: Advertising account planner  Tobacco Use  . Smoking status: Former Smoker  Packs/day: 1.50    Years: 41.00    Pack years: 61.50    Types: Cigarettes    Start date: 76    Quit date: 2010    Years since quitting: 11.2  . Smokeless tobacco: Never Used  Substance and Sexual Activity  . Alcohol use: No  . Drug use: No  . Sexual activity: Yes    Birth control/protection: Post-menopausal  Other Topics Concern  . Not on file  Social History Narrative   Recent move from [place] to walk   Married to [employer] dog at home   Social Determinants of [employer]:   . Difficulty of Paying Living Expenses:   Food Insecurity:   . Worried About Charity fundraiser in the Last Year:   . Arboriculturist in the Last Year:   Transportation Needs:   . Film/video editor (Medical):   Marland Kitchen Lack of Transportation (Non-Medical):   Physical Activity:   . Days of Exercise per Week:   . Minutes of Exercise per Session:   Stress:   . Feeling of Stress :   Social Connections:   . Frequency of Communication with Friends and Family:   . Frequency of Social Gatherings with Friends and Family:   . Attends Religious Services:   .  Active Member of Clubs or Organizations:   . Attends Archivist Meetings:   Marland Kitchen Marital Status:     Activities of Daily Living In your present state of health, do you have any difficulty performing the following activities: 03/18/2019 02/01/2019  Hearing? N N  Vision? Y Y  Comment - glasses  Difficulty concentrating or making decisions? N N  Walking or climbing stairs? N N  Dressing or bathing? N N  Doing errands, shopping? N N  Preparing Food and eating ? N -  Using the Toilet? N -  In the past six months, have you accidently leaked urine? N -  Do you have problems with loss of bowel control? N -  Managing your Medications? N -  Managing your Finances? N -  Housekeeping or managing your Housekeeping? N -  Some recent data might be hidden    Patient Education/ Literacy How often do you need to have someone help you when you read instructions, pamphlets, or other written materials from your doctor or pharmacy?: 1 - Never What is the last grade level you completed in school?: Some College  Exercise Current Exercise Habits: Home exercise routine, Time (Minutes): 30, Frequency (Times/Week): 7, Weekly Exercise (Minutes/Week): 210, Intensity: Mild, Exercise limited by: None identified  Diet Patient reports consuming 2 meals a day and 2 snack(s) a day Patient reports that her primary diet is: Regular Patient reports that she does have regular access to food.   Depression Screen PHQ 2/9 Scores 03/18/2019 03/01/2019 03/23/2018 12/17/2017 11/21/2016  PHQ - 2 Score 0 0 0 0 0     Fall Risk Fall Risk  03/18/2019 03/01/2019 03/23/2018 12/17/2017 11/21/2016  Falls in the past year? 0 0 0 0 No  Number falls in past yr: 0 - - - -  Injury with Fall? 0 - - - -  Risk for fall due to : No Fall Risks - - - -  Follow up Falls evaluation completed - - - -     Objective:  Erica Braun seemed alert and oriented and she participated appropriately during our telephone  visit.  Blood Pressure Weight BMI  BP Readings from Last 3 Encounters:  03/01/19 138/71  02/01/19 124/76  01/25/19 120/62   Wt Readings from Last 3 Encounters:  03/18/19 134 lb 11.2 oz (61.1 kg)  03/01/19 134 lb 9.6 oz (61.1 kg)  02/01/19 131 lb 9.6 oz (59.7 kg)   BMI Readings from Last 1 Encounters:  03/18/19 24.64 kg/m    *Unable to obtain current vital signs, weight, and BMI due to telephone visit type  Hearing/Vision  . Erica Braun did not seem to have difficulty with hearing/understanding during the telephone conversation . Reports that she has not had a formal eye exam by an eye care professional within the past year . Reports that she has not had a formal hearing evaluation within the past year *Unable to fully assess hearing and vision during telephone visit type  Cognitive Function: 6CIT Screen 03/18/2019  What Year? 0 points  What month? 0 points  What time? 0 points  Count back from 20 0 points  Months in reverse 0 points  Repeat phrase 0 points  Total Score 0   (Normal:0-7, Significant for Dysfunction: >8)  Normal Cognitive Function Screening: Yes   Immunization & Health Maintenance Record Immunization History  Administered Date(s) Administered  . Influenza, High Dose Seasonal PF 11/01/2017, 10/07/2018, 11/08/2018, 11/08/2018  . Influenza,inj,Quad PF,6+ Mos 11/21/2016  . Pneumococcal Polysaccharide-23 12/11/2017    Health Maintenance  Topic Date Due  . OPHTHALMOLOGY EXAM  03/18/2018  . HEMOGLOBIN A1C  09/23/2018  . PNA vac Low Risk Adult (2 of 2 - PCV13) 12/12/2018  . FOOT EXAM  12/18/2018  . TETANUS/TDAP  03/23/2019 (Originally 06/18/1969)  . MAMMOGRAM  03/24/2019  . COLONOSCOPY  03/21/2025  . INFLUENZA VACCINE  Completed  . DEXA SCAN  Completed  . Hepatitis C Screening  Completed       Assessment  This is a routine wellness examination for Erica Braun.  Health Maintenance: Due or Overdue Health Maintenance Due  Topic Date Due  .  OPHTHALMOLOGY EXAM  03/18/2018  . HEMOGLOBIN A1C  09/23/2018  . PNA vac Low Risk Adult (2 of 2 - PCV13) 12/12/2018  . FOOT EXAM  12/18/2018    Erica Braun does not need a referral for Community Assistance: Care Management:   no Social Work:    no Prescription Assistance:  no Nutrition/Diabetes Education:  no   Plan:  Personalized Goals Goals Addressed   None    Personalized Health Maintenance & Screening Recommendations  Pneumococcal vaccine  Td vaccine Glaucoma screening Advanced directives: has NO advanced directive  - add't info requested. Referral to SW: no  Lung Cancer Screening Recommended: no (Low Dose CT Chest recommended if Age 15-80 years, 30 pack-year currently smoking OR have quit w/in past 15 years) Hepatitis C Screening recommended: no HIV Screening recommended: no  Advanced Directives: Written information was prepared per patient's request.  Referrals & Orders No orders of the defined types were placed in this encounter.   Follow-up Plan . Follow-up with Baruch Gouty, [name] as planned    I have personally reviewed and noted the following in the patient's chart:   . Medical and social history . Use of alcohol, tobacco or illicit drugs  . Current medications and supplements . Functional ability and status . Nutritional status . Physical activity . Advanced directives . List of other physicians . Hospitalizations, surgeries, and ER visits in previous 12 months . Vitals . Screenings to include cognitive, depression, and falls . Referrals  and appointments  In addition, I have reviewed and discussed with Ilisa Monta Tengan certain preventive protocols, quality metrics, and best practice recommendations. A written personalized care plan for preventive services as well as general preventive health recommendations is available and can be mailed to the patient at her request.      Wardell Heath, LPN  [id number]

## 2019-03-21 ENCOUNTER — Ambulatory Visit (INDEPENDENT_AMBULATORY_CARE_PROVIDER_SITE_OTHER): Payer: Medicare HMO | Admitting: Family Medicine

## 2019-03-21 ENCOUNTER — Other Ambulatory Visit: Payer: Self-pay

## 2019-03-21 ENCOUNTER — Encounter: Payer: Self-pay | Admitting: Family Medicine

## 2019-03-21 VITALS — BP 128/67 | HR 79 | Temp 97.1°F | Resp 20 | Ht 62.0 in | Wt 137.0 lb

## 2019-03-21 DIAGNOSIS — N3001 Acute cystitis with hematuria: Secondary | ICD-10-CM | POA: Diagnosis not present

## 2019-03-21 DIAGNOSIS — B379 Candidiasis, unspecified: Secondary | ICD-10-CM

## 2019-03-21 DIAGNOSIS — Z87440 Personal history of urinary (tract) infections: Secondary | ICD-10-CM

## 2019-03-21 DIAGNOSIS — R300 Dysuria: Secondary | ICD-10-CM | POA: Diagnosis not present

## 2019-03-21 LAB — URINALYSIS, COMPLETE
Bilirubin, UA: NEGATIVE
Glucose, UA: NEGATIVE
Ketones, UA: NEGATIVE
Nitrite, UA: NEGATIVE
Protein,UA: NEGATIVE
RBC, UA: NEGATIVE
Specific Gravity, UA: 1.02 (ref 1.005–1.030)
Urobilinogen, Ur: 0.2 mg/dL (ref 0.2–1.0)
pH, UA: 6 (ref 5.0–7.5)

## 2019-03-21 LAB — MICROSCOPIC EXAMINATION
RBC, Urine: NONE SEEN /hpf (ref 0–2)
Renal Epithel, UA: NONE SEEN /hpf

## 2019-03-21 MED ORDER — FLUCONAZOLE 150 MG PO TABS: ORAL_TABLET | ORAL | 0 refills | Status: DC

## 2019-03-21 MED ORDER — SULFAMETHOXAZOLE-TRIMETHOPRIM 800-160 MG PO TABS: 1.0000 | ORAL_TABLET | Freq: Two times a day (BID) | ORAL | 0 refills | Status: DC

## 2019-03-21 NOTE — Patient Instructions (Signed)

## 2019-03-21 NOTE — Progress Notes (Signed)
[id number], Erica Burkitt, [name] Chief Complaint  Patient presents with  . Urinary Tract Infection    3 week follow up     Current Issues:  Presents with several days of dysuria, urinary urgency and urinary frequency Associated symptoms include:  lower abdominal pain  There is a previous history of of similar symptoms. She was recently treated with macrobid but denies resolution of symptoms.   Sexually active:  No   No concern for STI.  Prior to Admission medications   Medication Sig Start Date End Date Taking? Authorizing Provider  albuterol (PROVENTIL) (2.5 MG/3ML) 0.083% nebulizer solution Take 3 mLs (2.5 mg total) every 6 (six) hours as needed by nebulization for wheezing or shortness of breath. 11/21/16  Yes Raylene Everts, MD  ANORO ELLIPTA 62.5-25 MCG/INH AEPB Inhale 1 puff into the lungs daily.  03/18/18  Yes [provider]  aspirin EC 81 MG tablet Take 81 mg by mouth daily.   Yes [provider]  atorvastatin (LIPITOR) 40 MG tablet Take 1 tablet by mouth once daily 07/29/18  Yes Herminio Commons, MD  Blood Glucose Monitor Software DEVI Test BS BID and PRN Dx E11.9 03/31/18  Yes Dorrine Montone, Erica Burkitt, [name]  Cholecalciferol (VITAMIN D3) 2000 units TABS Take 4,000 Units by mouth daily.   Yes [provider]  glucose blood test strip test BS BID Dx E11.9 03/30/18  Yes Tyrese Ficek, Erica Burkitt, [name]  Lancets [facility] ULTRASOFT) lancets Test BS BID Dx E11.9 03/30/18  Yes Jilberto Vanderwall, Erica Burkitt, [name]  losartan (COZAAR) 25 MG tablet Take 1 tablet (25 mg total) by mouth daily. 03/23/18  Yes Marvin Grabill, Erica Burkitt, [name]  metFORMIN (GLUCOPHAGE) 1000 MG tablet Take 1 tablet (1,000 mg total) by mouth 2 (two) times daily with a meal. 03/23/18  Yes Enrrique Mierzwa, Erica Burkitt, [name]  metoprolol tartrate (LOPRESSOR) 50 MG tablet Take 1 tablet by mouth twice daily 12/24/18  Yes Herminio Commons, MD  Multiple Vitamins-Minerals (MULTIVITAMIN PO) Take 1 tablet by mouth daily.   Yes [provider]  omeprazole  (PRILOSEC) 40 MG capsule Take 1 capsule (40 mg total) by mouth 2 (two) times daily before a meal. 05/03/18 03/21/19 Yes Mansouraty, Telford Nab., MD  isosorbide mononitrate (IMDUR) 30 MG 24 hr tablet Take 1 tablet (30 mg total) by mouth 2 (two) times daily. Patient taking differently: Take 30 mg by mouth daily.  07/22/17 03/01/19  Herminio Commons, MD  nitroGLYCERIN (NITROSTAT) 0.4 MG SL tablet Place 1 tablet (0.4 mg total) under the tongue every 5 (five) minutes as needed for chest pain. Patient not taking: Reported on 03/21/2019 01/14/19 01/14/20  Herminio Commons, MD  sulfamethoxazole-trimethoprim (BACTRIM DS) 800-160 MG tablet Take 1 tablet by mouth 2 (two) times daily. 03/21/19   Baruch Gouty, [name]    Review of Systems  Constitutional: Negative for chills, diaphoresis, fever, malaise/fatigue and weight loss.  Respiratory: Negative for cough and shortness of breath.   Cardiovascular: Negative for chest pain and palpitations.  Gastrointestinal: Positive for abdominal pain (lower abdominal pressure with voiding). Negative for nausea and vomiting.  Genitourinary: Positive for dysuria, frequency and urgency. Negative for flank pain and hematuria.  Musculoskeletal: Negative for myalgias.  Neurological: Negative for dizziness, loss of consciousness and weakness.  All other systems reviewed and are negative.    PE:  BP 128/67   Pulse 79   Temp (!) 97.1 F (36.2 C)   Resp 20   Ht 5\' 2"  (1.575 m)  Wt 137 lb (62.1 kg)   SpO2 99%   BMI 25.06 kg/m  Physical Exam  Constitutional: She is oriented to person, place, and time and well-developed, well-nourished, and in no distress. No distress.  HENT:  Head: Normocephalic and atraumatic.  Eyes: Pupils are equal, round, and reactive to light.  Cardiovascular: Normal rate, regular rhythm and normal heart sounds. Exam reveals no gallop and no friction rub.  No murmur heard. Pulmonary/Chest: Effort normal and breath sounds normal.  Abdominal:  Soft. Bowel sounds are normal. She exhibits no distension and no mass. There is no abdominal tenderness. There is no rebound, no guarding and no CVA tenderness.  Musculoskeletal:        General: Normal range of motion.     Cervical back: Normal range of motion and neck supple.  Neurological: She is alert and oriented to person, place, and time. Gait normal.  Skin: Skin is warm and dry. She is not diaphoretic.  Psychiatric: Mood, memory, affect and judgment normal.  Nursing note and vitals reviewed.    Results for orders placed or performed in visit on 03/01/19  Urine Culture   Specimen: Urine   UR  Result Value Ref Range   Urine Culture, Routine Final report (A)    Organism ID, Bacteria Escherichia coli (A)    Antimicrobial Susceptibility Comment   Microscopic Examination   URINE  Result Value Ref Range   WBC, UA >30 (A) 0 - 5 /hpf   RBC 0-2 0 - 2 /hpf   Epithelial Cells (non renal) 0-10 0 - 10 /hpf   Renal Epithel, UA None seen None seen /hpf   Bacteria, UA Few None seen/Few   Yeast, UA Present None seen  Urinalysis, Complete  Result Value Ref Range   Specific Gravity, UA 1.020 1.005 - 1.030   pH, UA 5.5 5.0 - 7.5   Color, UA Amber (A) Yellow   Appearance Ur Hazy (A) Clear   Leukocytes,UA 2+ (A) Negative   Protein,UA Negative Negative/Trace   Glucose, UA Negative Negative   Ketones, UA Negative Negative   RBC, UA Trace (A) Negative   Bilirubin, UA Negative Negative   Urobilinogen, Ur 0.2 0.2 - 1.0 mg/dL   Nitrite, UA Negative Negative   Microscopic Examination See below:     Assessment and Plan:    1. Recent urinary tract infection Urinalysis positive for blood, leukocytes, bacteria, and yeast. Culture pending.  - Urine Culture - Urinalysis, Complete  2. Acute cystitis with hematuria Urinalysis as above. Culture pending. No previous culture reviewed and medication based on culture. Symptomatic care discussed in detail.  - sulfamethoxazole-trimethoprim (BACTRIM  DS) 800-160 MG tablet; Take 1 tablet by mouth 2 (two) times daily.  Dispense: 14 tablet; Refill: 0   Return if symptoms worsen or fail to improve.  The above assessment and management plan was discussed with the patient. The patient verbalized understanding of and has agreed to the management plan. Patient is aware to call the clinic if they develop any new symptoms or if symptoms fail to improve or worsen. Patient is aware when to return to the clinic for a follow-up visit. Patient educated on when it is appropriate to go to the emergency department.   Monia Pouch, [name]-C [facility] [address] [place], [place] [address] [phone]

## 2019-03-23 LAB — URINE CULTURE

## 2019-04-04 ENCOUNTER — Other Ambulatory Visit: Payer: Self-pay

## 2019-04-04 ENCOUNTER — Other Ambulatory Visit: Payer: Self-pay | Admitting: Family Medicine

## 2019-04-04 ENCOUNTER — Other Ambulatory Visit: Payer: Medicare HMO

## 2019-04-04 DIAGNOSIS — R82998 Other abnormal findings in urine: Secondary | ICD-10-CM | POA: Diagnosis not present

## 2019-04-04 DIAGNOSIS — Z87440 Personal history of urinary (tract) infections: Secondary | ICD-10-CM

## 2019-04-04 LAB — URINALYSIS, COMPLETE
Bilirubin, UA: NEGATIVE
Glucose, UA: NEGATIVE
Ketones, UA: NEGATIVE
Leukocytes,UA: NEGATIVE
Nitrite, UA: NEGATIVE
Protein,UA: NEGATIVE
RBC, UA: NEGATIVE
Specific Gravity, UA: 1.02 (ref 1.005–1.030)
Urobilinogen, Ur: 0.2 mg/dL (ref 0.2–1.0)
pH, UA: 5.5 (ref 5.0–7.5)

## 2019-04-04 LAB — MICROSCOPIC EXAMINATION
RBC, Urine: NONE SEEN /hpf (ref 0–2)
Renal Epithel, UA: NONE SEEN /hpf

## 2019-04-05 LAB — URINE CULTURE: Organism ID, Bacteria: NO GROWTH

## 2019-04-07 ENCOUNTER — Ambulatory Visit: Payer: Medicare HMO | Attending: Internal Medicine

## 2019-04-07 DIAGNOSIS — Z23 Encounter for immunization: Secondary | ICD-10-CM

## 2019-04-07 NOTE — Progress Notes (Signed)
   Covid-19 Vaccination Clinic  Name:  Erica Braun    MRN: [id number] DOB: July 14, 1950  04/07/2019  [name] was observed post Covid-19 immunization for 15 minutes without incident. She was provided with Vaccine Information Sheet and instruction to access the V-Safe system.   [name] was instructed to call 911 with any severe reactions post vaccine: Marland Kitchen Difficulty breathing  . Swelling of face and throat  . A fast heartbeat  . A bad rash all over body  . Dizziness and weakness   Immunizations Administered    Name Date Dose VIS Date Route   Moderna COVID-19 Vaccine 04/07/2019 10:23 AM 0.5 mL 12/07/2018 Intramuscular   Manufacturer: Moderna   Lot: [id number]   [id number]

## 2019-04-25 DIAGNOSIS — Z20822 Contact with and (suspected) exposure to covid-19: Secondary | ICD-10-CM | POA: Diagnosis not present

## 2019-04-25 DIAGNOSIS — G4733 Obstructive sleep apnea (adult) (pediatric): Secondary | ICD-10-CM | POA: Diagnosis not present

## 2019-04-25 DIAGNOSIS — Z01812 Encounter for preprocedural laboratory examination: Secondary | ICD-10-CM | POA: Diagnosis not present

## 2019-04-28 ENCOUNTER — Encounter: Payer: Self-pay | Admitting: Family Medicine

## 2019-04-28 DIAGNOSIS — I10 Essential (primary) hypertension: Secondary | ICD-10-CM | POA: Diagnosis not present

## 2019-04-28 DIAGNOSIS — E119 Type 2 diabetes mellitus without complications: Secondary | ICD-10-CM | POA: Diagnosis not present

## 2019-04-28 DIAGNOSIS — K219 Gastro-esophageal reflux disease without esophagitis: Secondary | ICD-10-CM | POA: Diagnosis not present

## 2019-04-28 DIAGNOSIS — E785 Hyperlipidemia, unspecified: Secondary | ICD-10-CM | POA: Diagnosis not present

## 2019-04-28 DIAGNOSIS — I2510 Atherosclerotic heart disease of native coronary artery without angina pectoris: Secondary | ICD-10-CM | POA: Diagnosis not present

## 2019-04-28 DIAGNOSIS — K222 Esophageal obstruction: Secondary | ICD-10-CM | POA: Diagnosis not present

## 2019-04-28 DIAGNOSIS — Z8501 Personal history of malignant neoplasm of esophagus: Secondary | ICD-10-CM | POA: Diagnosis not present

## 2019-04-28 DIAGNOSIS — R1310 Dysphagia, unspecified: Secondary | ICD-10-CM | POA: Diagnosis not present

## 2019-04-28 DIAGNOSIS — G4730 Sleep apnea, unspecified: Secondary | ICD-10-CM | POA: Diagnosis not present

## 2019-04-28 DIAGNOSIS — Z85828 Personal history of other malignant neoplasm of skin: Secondary | ICD-10-CM | POA: Diagnosis not present

## 2019-04-28 DIAGNOSIS — J449 Chronic obstructive pulmonary disease, unspecified: Secondary | ICD-10-CM | POA: Diagnosis not present

## 2019-05-10 ENCOUNTER — Ambulatory Visit: Payer: Medicare HMO | Attending: Internal Medicine

## 2019-05-10 DIAGNOSIS — Z23 Encounter for immunization: Secondary | ICD-10-CM

## 2019-05-10 NOTE — Progress Notes (Signed)
   Covid-19 Vaccination Clinic  Name:  Erica Braun    MRN: [id number] DOB: [birth year]  05/10/2019  Erica Braun was observed post Covid-19 immunization for 15 minutes without incident. She was provided with Vaccine Information Sheet and instruction to access the V-Safe system.   Erica Braun was instructed to call 911 with any severe reactions post vaccine: Marland Kitchen Difficulty breathing  . Swelling of face and throat  . A fast heartbeat  . A bad rash all over body  . Dizziness and weakness   Immunizations Administered    Name Date Dose VIS Date Route   Moderna COVID-19 Vaccine 05/10/2019  8:39 AM 0.5 mL 12/2018 Intramuscular   Manufacturer: Moderna   Lot: [id number]   [id number]

## 2019-05-25 DIAGNOSIS — G4733 Obstructive sleep apnea (adult) (pediatric): Secondary | ICD-10-CM | POA: Diagnosis not present

## 2019-06-01 ENCOUNTER — Other Ambulatory Visit: Payer: Self-pay | Admitting: Pulmonary Disease

## 2019-06-09 DIAGNOSIS — K222 Esophageal obstruction: Secondary | ICD-10-CM | POA: Diagnosis not present

## 2019-06-09 DIAGNOSIS — K219 Gastro-esophageal reflux disease without esophagitis: Secondary | ICD-10-CM | POA: Diagnosis not present

## 2019-06-09 DIAGNOSIS — Z7982 Long term (current) use of aspirin: Secondary | ICD-10-CM | POA: Diagnosis not present

## 2019-06-09 DIAGNOSIS — I10 Essential (primary) hypertension: Secondary | ICD-10-CM | POA: Diagnosis not present

## 2019-06-09 DIAGNOSIS — R1310 Dysphagia, unspecified: Secondary | ICD-10-CM | POA: Diagnosis not present

## 2019-06-09 DIAGNOSIS — Z85828 Personal history of other malignant neoplasm of skin: Secondary | ICD-10-CM | POA: Diagnosis not present

## 2019-06-09 DIAGNOSIS — I2510 Atherosclerotic heart disease of native coronary artery without angina pectoris: Secondary | ICD-10-CM | POA: Diagnosis not present

## 2019-06-09 DIAGNOSIS — Z87891 Personal history of nicotine dependence: Secondary | ICD-10-CM | POA: Diagnosis not present

## 2019-06-09 DIAGNOSIS — E119 Type 2 diabetes mellitus without complications: Secondary | ICD-10-CM | POA: Diagnosis not present

## 2019-06-09 DIAGNOSIS — G4730 Sleep apnea, unspecified: Secondary | ICD-10-CM | POA: Diagnosis not present

## 2019-06-09 DIAGNOSIS — J449 Chronic obstructive pulmonary disease, unspecified: Secondary | ICD-10-CM | POA: Diagnosis not present

## 2019-06-13 ENCOUNTER — Other Ambulatory Visit: Payer: Self-pay | Admitting: *Deleted

## 2019-06-13 DIAGNOSIS — E1122 Type 2 diabetes mellitus with diabetic chronic kidney disease: Secondary | ICD-10-CM

## 2019-06-13 DIAGNOSIS — I129 Hypertensive chronic kidney disease with stage 1 through stage 4 chronic kidney disease, or unspecified chronic kidney disease: Secondary | ICD-10-CM

## 2019-06-13 MED ORDER — LOSARTAN POTASSIUM 25 MG PO TABS: 25.0000 mg | ORAL_TABLET | Freq: Every day | ORAL | 0 refills | Status: DC

## 2019-06-25 DIAGNOSIS — G4733 Obstructive sleep apnea (adult) (pediatric): Secondary | ICD-10-CM | POA: Diagnosis not present

## 2019-06-29 ENCOUNTER — Other Ambulatory Visit (HOSPITAL_COMMUNITY): Payer: Self-pay | Admitting: Family Medicine

## 2019-06-29 ENCOUNTER — Other Ambulatory Visit (HOSPITAL_COMMUNITY): Payer: Self-pay | Admitting: Family

## 2019-06-29 DIAGNOSIS — Z1231 Encounter for screening mammogram for malignant neoplasm of breast: Secondary | ICD-10-CM

## 2019-07-19 DIAGNOSIS — H52223 Regular astigmatism, bilateral: Secondary | ICD-10-CM | POA: Diagnosis not present

## 2019-07-19 DIAGNOSIS — H25813 Combined forms of age-related cataract, bilateral: Secondary | ICD-10-CM | POA: Diagnosis not present

## 2019-07-19 DIAGNOSIS — H5203 Hypermetropia, bilateral: Secondary | ICD-10-CM | POA: Diagnosis not present

## 2019-07-19 DIAGNOSIS — Z7984 Long term (current) use of oral hypoglycemic drugs: Secondary | ICD-10-CM | POA: Diagnosis not present

## 2019-07-19 DIAGNOSIS — E119 Type 2 diabetes mellitus without complications: Secondary | ICD-10-CM | POA: Diagnosis not present

## 2019-07-19 DIAGNOSIS — H524 Presbyopia: Secondary | ICD-10-CM | POA: Diagnosis not present

## 2019-07-19 LAB — HM DIABETES EYE EXAM

## 2019-07-22 ENCOUNTER — Encounter: Payer: Self-pay | Admitting: Internal Medicine

## 2019-07-22 DIAGNOSIS — I10 Essential (primary) hypertension: Secondary | ICD-10-CM | POA: Diagnosis not present

## 2019-07-22 DIAGNOSIS — K222 Esophageal obstruction: Secondary | ICD-10-CM | POA: Diagnosis not present

## 2019-07-22 DIAGNOSIS — Z7984 Long term (current) use of oral hypoglycemic drugs: Secondary | ICD-10-CM | POA: Diagnosis not present

## 2019-07-22 DIAGNOSIS — I2510 Atherosclerotic heart disease of native coronary artery without angina pectoris: Secondary | ICD-10-CM | POA: Diagnosis not present

## 2019-07-22 DIAGNOSIS — K219 Gastro-esophageal reflux disease without esophagitis: Secondary | ICD-10-CM | POA: Diagnosis not present

## 2019-07-22 DIAGNOSIS — Z8719 Personal history of other diseases of the digestive system: Secondary | ICD-10-CM | POA: Diagnosis not present

## 2019-07-22 DIAGNOSIS — J449 Chronic obstructive pulmonary disease, unspecified: Secondary | ICD-10-CM | POA: Diagnosis not present

## 2019-07-22 DIAGNOSIS — E1140 Type 2 diabetes mellitus with diabetic neuropathy, unspecified: Secondary | ICD-10-CM | POA: Diagnosis not present

## 2019-07-22 DIAGNOSIS — R1310 Dysphagia, unspecified: Secondary | ICD-10-CM | POA: Diagnosis not present

## 2019-07-22 DIAGNOSIS — Z8501 Personal history of malignant neoplasm of esophagus: Secondary | ICD-10-CM | POA: Diagnosis not present

## 2019-07-22 DIAGNOSIS — E119 Type 2 diabetes mellitus without complications: Secondary | ICD-10-CM | POA: Diagnosis not present

## 2019-07-22 DIAGNOSIS — G4730 Sleep apnea, unspecified: Secondary | ICD-10-CM | POA: Diagnosis not present

## 2019-07-22 DIAGNOSIS — Z85828 Personal history of other malignant neoplasm of skin: Secondary | ICD-10-CM | POA: Diagnosis not present

## 2019-07-25 ENCOUNTER — Other Ambulatory Visit: Payer: Self-pay

## 2019-07-25 ENCOUNTER — Ambulatory Visit (HOSPITAL_COMMUNITY)
Admission: RE | Admit: 2019-07-25 | Discharge: 2019-07-25 | Disposition: A | Discharge status: Home / Self Care | Payer: Medicare HMO | Source: Ambulatory Visit | Attending: Family | Admitting: Family

## 2019-07-25 DIAGNOSIS — Z1231 Encounter for screening mammogram for malignant neoplasm of breast: Secondary | ICD-10-CM | POA: Diagnosis not present

## 2019-07-28 ENCOUNTER — Other Ambulatory Visit: Payer: Self-pay | Admitting: Family

## 2019-07-28 DIAGNOSIS — E1122 Type 2 diabetes mellitus with diabetic chronic kidney disease: Secondary | ICD-10-CM

## 2019-07-28 DIAGNOSIS — I129 Hypertensive chronic kidney disease with stage 1 through stage 4 chronic kidney disease, or unspecified chronic kidney disease: Secondary | ICD-10-CM

## 2019-07-28 NOTE — Telephone Encounter (Signed)
Appointment scheduled.

## 2019-07-28 NOTE — Telephone Encounter (Signed)
30 day supply was given 06/13/19  Needs appointment for further refills

## 2019-08-01 ENCOUNTER — Other Ambulatory Visit: Payer: Self-pay | Admitting: Gastroenterology

## 2019-08-01 DIAGNOSIS — K2100 Gastro-esophageal reflux disease with esophagitis, without bleeding: Secondary | ICD-10-CM

## 2019-08-06 DIAGNOSIS — Z20822 Contact with and (suspected) exposure to covid-19: Secondary | ICD-10-CM | POA: Diagnosis not present

## 2019-08-09 ENCOUNTER — Ambulatory Visit: Payer: Medicare HMO | Admitting: Family

## 2019-09-01 ENCOUNTER — Ambulatory Visit (INDEPENDENT_AMBULATORY_CARE_PROVIDER_SITE_OTHER): Payer: Medicare HMO | Admitting: Family

## 2019-09-01 ENCOUNTER — Encounter: Payer: Self-pay | Admitting: Family

## 2019-09-01 ENCOUNTER — Other Ambulatory Visit: Payer: Self-pay

## 2019-09-01 VITALS — BP 119/76 | HR 87 | Temp 97.8°F | Ht 62.0 in | Wt 148.4 lb

## 2019-09-01 DIAGNOSIS — I2510 Atherosclerotic heart disease of native coronary artery without angina pectoris: Secondary | ICD-10-CM

## 2019-09-01 DIAGNOSIS — R1310 Dysphagia, unspecified: Secondary | ICD-10-CM | POA: Diagnosis not present

## 2019-09-01 DIAGNOSIS — E1122 Type 2 diabetes mellitus with diabetic chronic kidney disease: Secondary | ICD-10-CM

## 2019-09-01 DIAGNOSIS — K2100 Gastro-esophageal reflux disease with esophagitis, without bleeding: Secondary | ICD-10-CM

## 2019-09-01 DIAGNOSIS — R1319 Other dysphagia: Secondary | ICD-10-CM

## 2019-09-01 DIAGNOSIS — I10 Essential (primary) hypertension: Secondary | ICD-10-CM

## 2019-09-01 DIAGNOSIS — Z23 Encounter for immunization: Secondary | ICD-10-CM | POA: Diagnosis not present

## 2019-09-01 DIAGNOSIS — E1159 Type 2 diabetes mellitus with other circulatory complications: Secondary | ICD-10-CM

## 2019-09-01 DIAGNOSIS — Z0001 Encounter for general adult medical examination with abnormal findings: Secondary | ICD-10-CM | POA: Diagnosis not present

## 2019-09-01 DIAGNOSIS — G4733 Obstructive sleep apnea (adult) (pediatric): Secondary | ICD-10-CM

## 2019-09-01 DIAGNOSIS — I129 Hypertensive chronic kidney disease with stage 1 through stage 4 chronic kidney disease, or unspecified chronic kidney disease: Secondary | ICD-10-CM | POA: Diagnosis not present

## 2019-09-01 DIAGNOSIS — C159 Malignant neoplasm of esophagus, unspecified: Secondary | ICD-10-CM

## 2019-09-01 DIAGNOSIS — E1169 Type 2 diabetes mellitus with other specified complication: Secondary | ICD-10-CM

## 2019-09-01 DIAGNOSIS — E785 Hyperlipidemia, unspecified: Secondary | ICD-10-CM

## 2019-09-01 DIAGNOSIS — Z0000 Encounter for general adult medical examination without abnormal findings: Secondary | ICD-10-CM | POA: Diagnosis not present

## 2019-09-01 DIAGNOSIS — J431 Panlobular emphysema: Secondary | ICD-10-CM | POA: Diagnosis not present

## 2019-09-01 DIAGNOSIS — I152 Hypertension secondary to endocrine disorders: Secondary | ICD-10-CM

## 2019-09-01 DIAGNOSIS — E119 Type 2 diabetes mellitus without complications: Secondary | ICD-10-CM

## 2019-09-01 DIAGNOSIS — R69 Illness, unspecified: Secondary | ICD-10-CM | POA: Diagnosis not present

## 2019-09-01 DIAGNOSIS — Z9989 Dependence on other enabling machines and devices: Secondary | ICD-10-CM

## 2019-09-01 LAB — BAYER DCA HB A1C WAIVED: HB A1C (BAYER DCA - WAIVED): 6.7 % (ref <7.0)

## 2019-09-01 MED ORDER — LOSARTAN POTASSIUM 25 MG PO TABS: 25.0000 mg | ORAL_TABLET | Freq: Every day | ORAL | 4 refills | Status: DC

## 2019-09-01 MED ORDER — OMEPRAZOLE 40 MG PO CPDR: 40.0000 mg | DELAYED_RELEASE_CAPSULE | Freq: Two times a day (BID) | ORAL | 3 refills | Status: DC

## 2019-09-01 MED ORDER — ALBUTEROL SULFATE (2.5 MG/3ML) 0.083% IN NEBU: 2.5000 mg | INHALATION_SOLUTION | Freq: Four times a day (QID) | RESPIRATORY_TRACT | 3 refills | Status: DC | PRN

## 2019-09-01 NOTE — Patient Instructions (Signed)
Health Maintenance After Age 70 After age 70, you are at a higher risk for certain long-term diseases and infections as well as injuries from falls. Falls are a major cause of broken bones and head injuries in people who are older than age 70. Getting regular preventive care can help to keep you healthy and well. Preventive care includes getting regular testing and making lifestyle changes as recommended by your health care provider. Talk with your health care provider about:  Which screenings and tests you should have. A screening is a test that checks for a disease when you have no symptoms.  A diet and exercise plan that is right for you. What should I know about screenings and tests to prevent falls? Screening and testing are the best ways to find a health problem early. Early diagnosis and treatment give you the best chance of managing medical conditions that are common after age 70. Certain conditions and lifestyle choices may make you more likely to have a fall. Your health care provider may recommend:  Regular vision checks. Poor vision and conditions such as cataracts can make you more likely to have a fall. If you wear glasses, make sure to get your prescription updated if your vision changes.  Medicine review. Work with your health care provider to regularly review all of the medicines you are taking, including over-the-counter medicines. Ask your health care provider about any side effects that may make you more likely to have a fall. Tell your health care provider if any medicines that you take make you feel dizzy or sleepy.  Osteoporosis screening. Osteoporosis is a condition that causes the bones to get weaker. This can make the bones weak and cause them to break more easily.  Blood pressure screening. Blood pressure changes and medicines to control blood pressure can make you feel dizzy.  Strength and balance checks. Your health care provider may recommend certain tests to check your  strength and balance while standing, walking, or changing positions.  Foot health exam. Foot pain and numbness, as well as not wearing proper footwear, can make you more likely to have a fall.  Depression screening. You may be more likely to have a fall if you have a fear of falling, feel emotionally low, or feel unable to do activities that you used to do.  Alcohol use screening. Using too much alcohol can affect your balance and may make you more likely to have a fall. What actions can I take to lower my risk of falls? General instructions  Talk with your health care provider about your risks for falling. Tell your health care provider if: ? You fall. Be sure to tell your health care provider about all falls, even ones that seem minor. ? You feel dizzy, sleepy, or off-balance.  Take over-the-counter and prescription medicines only as told by your health care provider. These include any supplements.  Eat a healthy diet and maintain a healthy weight. A healthy diet includes low-fat dairy products, low-fat (lean) meats, and fiber from whole grains, beans, and lots of fruits and vegetables. Home safety  Remove any tripping hazards, such as rugs, cords, and clutter.  Install safety equipment such as grab bars in bathrooms and safety rails on stairs.  Keep rooms and walkways well-lit. Activity   Follow a regular exercise program to stay fit. This will help you maintain your balance. Ask your health care provider what types of exercise are appropriate for you.  If you need a cane or   walker, use it as recommended by your health care provider.  Wear supportive shoes that have nonskid soles. Lifestyle  Do not drink alcohol if your health care provider tells you not to drink.  If you drink alcohol, limit how much you have: ? 0-1 drink a day for women. ? 0-2 drinks a day for men.  Be aware of how much alcohol is in your drink. In the [place] one drink equals one typical bottle of beer (12  oz), one-half glass of wine (5 oz), or one shot of hard liquor (1 oz).  Do not use any products that contain nicotine or tobacco, such as cigarettes and e-cigarettes. If you need help quitting, ask your health care provider. Summary  Having a healthy lifestyle and getting preventive care can help to protect your health and wellness after age 70.  Screening and testing are the best way to find a health problem early and help you avoid having a fall. Early diagnosis and treatment give you the best chance for managing medical conditions that are more common for people who are older than age 70.  Falls are a major cause of broken bones and head injuries in people who are older than age 70. Take precautions to prevent a fall at home.  Work with your health care provider to learn what changes you can make to improve your health and wellness and to prevent falls. This information is not intended to replace advice given to you by your health care provider. Make sure you discuss any questions you have with your health care provider. Document Revised: 04/15/2018 Document Reviewed: 11/05/2016 Elsevier Patient Education  2020 Elsevier Inc.  

## 2019-09-01 NOTE — Progress Notes (Signed)
Subjective:    Patient ID: [name], female    DOB: [birth year], 69 y.o.   MRN: [id number]  Chief Complaint  Patient presents with  . Medical Management of Chronic Issues   Pt presents to the office today to establish care with me and CPE. She is followed by Cardiologists for CAD, pulmonologist's for COPD, and GI for esophageal dysphagia and hx of squamous cell esophageal cancer.   Hypertension This is a chronic problem. The current episode started more than 1 year ago. The problem has been resolved since onset. The problem is controlled. Pertinent negatives include no blurred vision, malaise/fatigue, peripheral edema or shortness of breath. Risk factors for coronary artery disease include dyslipidemia and diabetes mellitus. The current treatment provides moderate improvement. Hypertensive end-organ damage includes CAD/MI. There is no history of CVA or heart failure.  Gastroesophageal Reflux She complains of belching, dysphagia and heartburn. This is a chronic problem. The current episode started more than 1 year ago. The problem occurs occasionally. The problem has been resolved. Risk factors include obesity. She has tried a PPI for the symptoms. The treatment provided moderate relief.  Diabetes She presents for her follow-up diabetic visit. She has type 2 diabetes mellitus. Her disease course has been stable. There are no hypoglycemic associated symptoms. Pertinent negatives for diabetes include no blurred vision and no foot paresthesias. There are no hypoglycemic complications. Symptoms are stable. Diabetic complications include heart disease. Pertinent negatives for diabetic complications include no CVA or peripheral neuropathy. Risk factors for coronary artery disease include hypertension, sedentary lifestyle, post-menopausal, dyslipidemia and diabetes mellitus. She is following a generally healthy diet. Her overall blood glucose range is 130-140 mg/dl. An ACE  inhibitor/angiotensin II receptor blocker is being taken. Eye exam is current.  Hyperlipidemia This is a chronic problem. The current episode started more than 1 year ago. The problem is controlled. Recent lipid tests were reviewed and are normal. Pertinent negatives include no shortness of breath. Current antihyperlipidemic treatment includes statins. The current treatment provides moderate improvement of lipids. Risk factors for coronary artery disease include dyslipidemia, hypertension, diabetes mellitus and a sedentary lifestyle.  COPD     Review of Systems  Constitutional: Negative for malaise/fatigue.  Eyes: Negative for blurred vision.  Respiratory: Negative for shortness of breath.   Gastrointestinal: Positive for dysphagia and heartburn.  All other systems reviewed and are negative.  Family History  Problem Relation Age of Onset  . Cancer Mother        breast  . Arthritis Mother   . COPD Father 20       emphysema  . Alcohol abuse Father   . Diabetes Father   . Cancer Sister        breast  . Early death Brother        overdose  . Drug abuse Brother   . Alcohol abuse Paternal Aunt   . COPD Paternal 32   . Cancer Maternal Grandmother        liver cancer  . Diabetes Maternal Grandmother   . Alcohol abuse Maternal Grandfather   . Early death Maternal Grandfather   . Stroke Paternal Grandmother   . Heart disease Paternal Grandfather   . Esophageal cancer Neg Hx   . Stomach cancer Neg Hx    Social History   Socioeconomic History  . Marital status: Married    Spouse name: Jenny Reichmann  . Number of children: 3  . Years of education: 60  . Highest education level: Some college,  no degree  Occupational History  . Occupation: retired    Comment: Advertising account planner  Tobacco Use  . Smoking status: Former Smoker    Packs/day: 1.50    Years: 41.00    Pack years: 61.50    Types: Cigarettes    Start date: 1969    Quit date: 2010    Years since quitting: 11.6  . Smokeless  tobacco: Never Used  Vaping Use  . Vaping Use: Never used  Substance and Sexual Activity  . Alcohol use: No  . Drug use: No  . Sexual activity: Yes    Birth control/protection: Post-menopausal  Other Topics Concern  . Not on file  Social History Narrative   Recent move from [place] to walk   Married to [employer] dog at home   Social Determinants of [employer]: Low Risk   . Difficulty of Paying Living Expenses: Not hard at all  Food Insecurity: No Food Insecurity  . Worried About Charity fundraiser in the Last Year: Never true  . Ran Out of Food in the Last Year: Never true  Transportation Needs: No Transportation Needs  . Lack of Transportation (Medical): No  . Lack of Transportation (Non-Medical): No  Physical Activity: Sufficiently Active  . Days of Exercise per Week: 7 days  . Minutes of Exercise per Session: 60 min  Stress: No Stress Concern Present  . Feeling of Stress : Only a little  Social Connections: Socially Integrated  . Frequency of Communication with Friends and Family: More than three times a week  . Frequency of Social Gatherings with Friends and Family: More than three times a week  . Attends Religious Services: More than 4 times per year  . Active Member of Clubs or Organizations: Yes  . Attends Archivist Meetings: More than 4 times per year  . Marital Status: Married       Objective:   Physical Exam Vitals reviewed.  Constitutional:      General: She is not in acute distress.    Appearance: She is well-developed.  HENT:     Head: Normocephalic and atraumatic.     Right Ear: Tympanic membrane normal.     Left Ear: Tympanic membrane normal.  Eyes:     Pupils: Pupils are equal, round, and reactive to light.  Neck:     Thyroid: No thyromegaly.  Cardiovascular:     Rate and Rhythm: Normal rate and regular rhythm.     Heart sounds: Normal heart sounds. No murmur heard.   Pulmonary:     Effort:  Pulmonary effort is normal. No respiratory distress.     Breath sounds: Normal breath sounds. No wheezing.  Abdominal:     General: Bowel sounds are normal. There is no distension.     Palpations: Abdomen is soft.     Tenderness: There is no abdominal tenderness.  Musculoskeletal:        General: No tenderness. Normal range of motion.     Cervical back: Normal range of motion and neck supple.  Skin:    General: Skin is warm and dry.  Neurological:     Mental Status: She is alert and oriented to person, place, and time.     Cranial Nerves: No cranial nerve deficit.     Deep Tendon Reflexes: Reflexes are normal and symmetric.  Psychiatric:        Behavior: Behavior normal.  Thought Content: Thought content normal.        Judgment: Judgment normal.       BP 119/76   Pulse 87   Temp 97.8 F (36.6 C) (Temporal)   Ht 5' 2"  (1.575 m)   Wt 148 lb 6.4 oz (67.3 kg)   SpO2 96%   BMI 27.14 kg/m      Assessment & Plan:  Kenn File Maruyama comes in today with chief complaint of Medical Management of Chronic Issues   Diagnosis and orders addressed:  1. Type 2 diabetes mellitus without complication, without long-term current use of insulin (HCC) - Bayer DCA Hb A1c Waived - CMP14+EGFR - CBC with Differential/Platelet  2. GERD with esophagitis - omeprazole (PRILOSEC) 40 MG capsule; Take 1 capsule (40 mg total) by mouth 2 (two) times daily before a meal.  Dispense: 60 capsule; Refill: 3 - CMP14+EGFR - CBC with Differential/Platelet  3. Hypertension associated with chronic kidney disease due to type 2 diabetes mellitus (HCC) - losartan (COZAAR) 25 MG tablet; Take 1 tablet (25 mg total) by mouth daily. (Needs to be seen before next refill)  Dispense: 90 tablet; Refill: 4 - CMP14+EGFR - CBC with Differential/Platelet  4. Squamous cell esophageal cancer (HCC) - CMP14+EGFR - CBC with Differential/Platelet  5. Esophageal dysphagia - CMP14+EGFR - CBC with  Differential/Platelet  6. Panlobular emphysema (HCC) - CMP14+EGFR - CBC with Differential/Platelet  7. Hypertension associated with diabetes ([place]) - CMP14+EGFR - CBC with Differential/Platelet  8. CAD in native artery - CMP14+EGFR - CBC with Differential/Platelet  9. OSA on CPAP - CMP14+EGFR - CBC with Differential/Platelet  10. Hyperlipidemia associated with type 2 diabetes mellitus (HCC) - CMP14+EGFR - CBC with Differential/Platelet - Lipid panel  11. Annual physical exam - CMP14+EGFR - CBC with Differential/Platelet - Lipid panel - TSH - Microalbumin / creatinine urine ratio   Labs pending Health Maintenance reviewed Diet and exercise encouraged  Follow up plan: 6 months   Evelina Dun, [name]

## 2019-09-02 LAB — CBC WITH DIFFERENTIAL/PLATELET
Basophils Absolute: 0 10*3/uL (ref 0.0–0.2)
Basos: 1 %
EOS (ABSOLUTE): 0.1 10*3/uL (ref 0.0–0.4)
Eos: 2 %
Hematocrit: 32.2 % — ABNORMAL LOW (ref 34.0–46.6)
Hemoglobin: 10.6 g/dL — ABNORMAL LOW (ref 11.1–15.9)
Immature Grans (Abs): 0 10*3/uL (ref 0.0–0.1)
Immature Granulocytes: 1 %
Lymphocytes Absolute: 1 10*3/uL (ref 0.7–3.1)
Lymphs: 23 %
MCH: 29.2 pg (ref 26.6–33.0)
MCHC: 32.9 g/dL (ref 31.5–35.7)
MCV: 89 fL (ref 79–97)
Monocytes Absolute: 0.4 10*3/uL (ref 0.1–0.9)
Monocytes: 10 %
Neutrophils Absolute: 2.9 10*3/uL (ref 1.4–7.0)
Neutrophils: 63 %
Platelets: 162 10*3/uL (ref 150–450)
RBC: 3.63 x10E6/uL — ABNORMAL LOW (ref 3.77–5.28)
RDW: 13.3 % (ref 11.7–15.4)
WBC: 4.4 10*3/uL (ref 3.4–10.8)

## 2019-09-02 LAB — CMP14+EGFR
ALT: 10 IU/L (ref 0–32)
AST: 10 IU/L (ref 0–40)
Albumin/Globulin Ratio: 2 (ref 1.2–2.2)
Albumin: 4.4 g/dL (ref 3.8–4.8)
Alkaline Phosphatase: 117 IU/L (ref 48–121)
BUN/Creatinine Ratio: 22 (ref 12–28)
BUN: 16 mg/dL (ref 8–27)
Bilirubin Total: 0.4 mg/dL (ref 0.0–1.2)
CO2: 25 mmol/L (ref 20–29)
Calcium: 9.7 mg/dL (ref 8.7–10.3)
Chloride: 99 mmol/L (ref 96–106)
Creatinine, Ser: 0.72 mg/dL (ref 0.57–1.00)
GFR calc Af Amer: 98 mL/min/{1.73_m2} (ref >59)
GFR calc non Af Amer: 85 mL/min/{1.73_m2} (ref >59)
Globulin, Total: 2.2 g/dL (ref 1.5–4.5)
Glucose: 106 mg/dL — ABNORMAL HIGH (ref 65–99)
Potassium: 4.1 mmol/L (ref 3.5–5.2)
Sodium: 136 mmol/L (ref 134–144)
Total Protein: 6.6 g/dL (ref 6.0–8.5)

## 2019-09-02 LAB — TSH: TSH: 1.88 u[IU]/mL (ref 0.450–4.500)

## 2019-09-02 LAB — LIPID PANEL
Chol/HDL Ratio: 3.6 ratio (ref 0.0–4.4)
Cholesterol, Total: 202 mg/dL — ABNORMAL HIGH (ref 100–199)
HDL: 56 mg/dL (ref >39)
LDL Chol Calc (NIH): 117 mg/dL — ABNORMAL HIGH (ref 0–99)
Triglycerides: 166 mg/dL — ABNORMAL HIGH (ref 0–149)
VLDL Cholesterol Cal: 29 mg/dL (ref 5–40)

## 2019-09-02 LAB — MICROALBUMIN / CREATININE URINE RATIO
Creatinine, Urine: 41 mg/dL
Microalb/Creat Ratio: <7 mg/g creat (ref 0–29)
Microalbumin, Urine: <3 ug/mL

## 2019-09-08 ENCOUNTER — Other Ambulatory Visit: Payer: Self-pay | Admitting: Family

## 2019-09-08 MED ORDER — ATORVASTATIN CALCIUM 40 MG PO TABS: 40.0000 mg | ORAL_TABLET | Freq: Every day | ORAL | 2 refills | Status: DC

## 2019-10-11 ENCOUNTER — Telehealth: Payer: Self-pay

## 2019-10-11 DIAGNOSIS — E119 Type 2 diabetes mellitus without complications: Secondary | ICD-10-CM

## 2019-10-11 MED ORDER — METFORMIN HCL 1000 MG PO TABS: 1000.0000 mg | ORAL_TABLET | Freq: Two times a day (BID) | ORAL | 3 refills | Status: DC

## 2019-10-11 NOTE — Telephone Encounter (Signed)
  Prescription Request  10/11/2019  What is the name of the medication or equipment? metFORMIN (GLUCOPHAGE) 1000 MG tablet  Have you contacted your pharmacy to request a refill? (if applicable) no  Which pharmacy would you like this sent to? [facility]   Patient notified that their request is being sent to the clinical staff for review and that they should receive a response within 2 business days.

## 2019-10-11 NOTE — Telephone Encounter (Signed)
sent 

## 2019-10-17 DIAGNOSIS — I2510 Atherosclerotic heart disease of native coronary artery without angina pectoris: Secondary | ICD-10-CM | POA: Diagnosis not present

## 2019-10-17 DIAGNOSIS — K209 Esophagitis, unspecified without bleeding: Secondary | ICD-10-CM | POA: Diagnosis not present

## 2019-10-17 DIAGNOSIS — Z87891 Personal history of nicotine dependence: Secondary | ICD-10-CM | POA: Diagnosis not present

## 2019-10-17 DIAGNOSIS — K2100 Gastro-esophageal reflux disease with esophagitis, without bleeding: Secondary | ICD-10-CM | POA: Diagnosis not present

## 2019-10-17 DIAGNOSIS — Z9989 Dependence on other enabling machines and devices: Secondary | ICD-10-CM | POA: Diagnosis not present

## 2019-10-17 DIAGNOSIS — K222 Esophageal obstruction: Secondary | ICD-10-CM | POA: Diagnosis not present

## 2019-10-17 DIAGNOSIS — K2210 Ulcer of esophagus without bleeding: Secondary | ICD-10-CM | POA: Diagnosis not present

## 2019-10-17 DIAGNOSIS — J449 Chronic obstructive pulmonary disease, unspecified: Secondary | ICD-10-CM | POA: Diagnosis not present

## 2019-10-17 DIAGNOSIS — G4730 Sleep apnea, unspecified: Secondary | ICD-10-CM | POA: Diagnosis not present

## 2019-10-17 DIAGNOSIS — R1310 Dysphagia, unspecified: Secondary | ICD-10-CM | POA: Diagnosis not present

## 2019-10-17 DIAGNOSIS — Z7984 Long term (current) use of oral hypoglycemic drugs: Secondary | ICD-10-CM | POA: Diagnosis not present

## 2019-10-17 DIAGNOSIS — E1140 Type 2 diabetes mellitus with diabetic neuropathy, unspecified: Secondary | ICD-10-CM | POA: Diagnosis not present

## 2019-10-17 DIAGNOSIS — E119 Type 2 diabetes mellitus without complications: Secondary | ICD-10-CM | POA: Diagnosis not present

## 2019-10-17 DIAGNOSIS — I10 Essential (primary) hypertension: Secondary | ICD-10-CM | POA: Diagnosis not present

## 2019-10-20 ENCOUNTER — Other Ambulatory Visit: Payer: Self-pay

## 2019-10-20 ENCOUNTER — Encounter: Payer: Self-pay | Admitting: Pulmonary Disease

## 2019-10-20 ENCOUNTER — Ambulatory Visit: Payer: Medicare HMO | Admitting: Pulmonary Disease

## 2019-10-20 VITALS — BP 122/72 | HR 90 | Temp 97.5°F | Ht 62.0 in | Wt 146.4 lb

## 2019-10-20 DIAGNOSIS — G4733 Obstructive sleep apnea (adult) (pediatric): Secondary | ICD-10-CM | POA: Diagnosis not present

## 2019-10-20 DIAGNOSIS — J432 Centrilobular emphysema: Secondary | ICD-10-CM | POA: Diagnosis not present

## 2019-10-20 DIAGNOSIS — Z23 Encounter for immunization: Secondary | ICD-10-CM | POA: Diagnosis not present

## 2019-10-20 MED ORDER — ANORO ELLIPTA 62.5-25 MCG/INH IN AEPB
1.0000 | INHALATION_SPRAY | Freq: Every day | RESPIRATORY_TRACT | 5 refills | Status: DC
Start: 2019-10-20 — End: 2020-10-16

## 2019-10-20 NOTE — Progress Notes (Addendum)
[facility]  Chief Complaint  Patient presents with  . Follow-up    Out of Anoro for a couple weeks    Constitutional:  BP 122/72 (BP Location: Left Arm, Cuff Size: Normal)   Pulse 90   Temp (!) 97.5 F (36.4 C) (Other (Comment)) Comment (Src): wrist  Ht 5\' 2"  (1.575 m)   Wt 146 lb 6.4 oz (66.4 kg)   SpO2 98% Comment: Room air  BMI 26.78 kg/m   Past Medical History:  CAD, DM type 2, Esophageal cancer, Diverticulosis, Esophageal stricture, Cataracts, GERD, HLD, HTN, PNA, COVID 25 November 2018  Past Surgical History:  Her  has a past surgical history that includes Abdominal hysterectomy; Breast biopsy (Left); LEFT HEART CATH AND CORONARY ANGIOGRAPHY (N/A, 06/11/2017); Breast lumpectomy (Left); Esophagogastroduodenoscopy (egd) with propofol (N/A, 05/03/2018); biopsy (05/03/2018); EUS (05/03/2018); Esophagogastroduodenoscopy (egd) with propofol (N/A, 11/18/2018); and Foreign Body Removal (11/18/2018).  Brief Summary:  Erica Braun is a 69 y.o. female former smoker with COPD and obstructive sleep apnea.       Subjective:   Previously seen by Dr. Vaughan Browner.  She was found to have Esophageal cancer and had surgery.  She had EGD recently for esophageal stricture.  Ran out of anoro about 1 week ago.  Anoro was helping.  Uses albuterol intermittently, especially when she works in her yard.  Not having much cough or sputum.  Denies wheezing, chest pain, fever, or hemoptysis.  Uses CPAP nightly.  Has full face mask.  No issues with mask fit.  Physical Exam:   Appearance - well kempt   ENMT - no sinus tenderness, no oral exudate, no LAN, Mallampati 2 airway, no stridor, wears dentures, elongated uvula  Respiratory - equal breath sounds bilaterally, no wheezing or rales  CV - s1s2 regular rate and rhythm, no murmurs  Ext - no clubbing, no edema  Skin - no rashes  Psych - normal mood and affect   Pulmonary testing:    Spirometry 03/02/15 >> FEV1 1.23 (57%), FEV1% 50  PFT 12/11/17 >> FEV1 0.98 (47%), FEV1% 60, TLC 108%, DLCO 59%  IgE 12/11/17 >> 91  A1AT 12/13/17 >> 179, MM  Chest Imaging:   CT chest 04/23/18 >> mild centrilobular emphysema, scarring in lingular and RML  Sleep Tests:   PSG 11/15/17 >> AHI 11.1, SpO2 low 74%  CPAP 09/20/19 to 10/19/19 >> used on 30 of 30 nights with average 7 hrs 44 min.  Average AHI 0.2 with CPAP 7 cm H2O  Cardiac Tests:   LHC 06/11/17 >> EF 55%, nl LVEDP, patent Lt main and circumflex stent, chronic occlusion of mid RCA  Social History:  She  reports that she quit smoking about 11 years ago. Her smoking use included cigarettes. She started smoking about 52 years ago. She has a 61.50 pack-year smoking history. She has never used smokeless tobacco. She reports that she does not drink alcohol and does not use drugs.  Family History:  Her family history includes Alcohol abuse in her father, maternal grandfather, and paternal aunt; Arthritis in her mother; COPD in her paternal aunt; COPD (age of onset: 39) in her father; Cancer in her maternal grandmother, mother, and sister; Diabetes in her father and maternal grandmother; Drug abuse in her brother; Early death in her brother and maternal grandfather; Heart disease in her paternal grandfather; Stroke in her paternal grandmother.      Assessment/Plan:   COPD with emphysema. - refill anoro - prn albuterol -  high dose influenza vaccine today  Obstructive sleep apnea. - she is compliant with CPAP and reports benefit - uses [employer] for her DME - will get copy of her download and call her with results  Squamous cell carcinoma of esophagus. - Stage 1 - previously seen by Dr. Burr Medico with oncology - followed by GI at [facility]  CAD s/p stent. - followed by [facility]  Time Spent Involved in Patient Care on Day of Examination:  32 minutes  Follow up:  Patient Instructions  Will get copy of your CPAP  report  High dose flu shot today  Follow up in 1 year   Medication List:   Allergies as of 10/20/2019   No Known Allergies     Medication List       Accurate as of October 20, 2019 11:25 AM. If you have any questions, ask your nurse or doctor.        albuterol (2.5 MG/3ML) 0.083% nebulizer solution Commonly known as: PROVENTIL Take 3 mLs (2.5 mg total) by nebulization every 6 (six) hours as needed for wheezing or shortness of breath.   Anoro Ellipta 62.5-25 MCG/INH Aepb Generic drug: umeclidinium-vilanterol Inhale 1 puff into the lungs daily. What changed: See the new instructions. Changed by: Chesley Mires, MD   aspirin EC 81 MG tablet Take 81 mg by mouth daily.   atorvastatin 40 MG tablet Commonly known as: LIPITOR Take 1 tablet (40 mg total) by mouth daily.   Blood Glucose Monitor Software Devi Test BS BID and PRN Dx E11.9   famotidine 40 MG tablet Commonly known as: PEPCID Take 1 tablet by mouth at bedtime.   glimepiride 2 MG tablet Commonly known as: AMARYL Take 1 tablet by mouth daily as needed.   glucose blood test strip test BS BID Dx E11.9   isosorbide mononitrate 30 MG 24 hr tablet Commonly known as: Imdur Take 1 tablet (30 mg total) by mouth 2 (two) times daily. What changed: when to take this   losartan 25 MG tablet Commonly known as: COZAAR Take 1 tablet (25 mg total) by mouth daily. (Needs to be seen before next refill)   metFORMIN 1000 MG tablet Commonly known as: GLUCOPHAGE Take 1 tablet (1,000 mg total) by mouth 2 (two) times daily with a meal.   metoprolol tartrate 50 MG tablet Commonly known as: LOPRESSOR Take 1 tablet by mouth twice daily   MULTIVITAMIN PO Take 1 tablet by mouth daily.   nitroGLYCERIN 0.4 MG SL tablet Commonly known as: Nitrostat Place 1 tablet (0.4 mg total) under the tongue every 5 (five) minutes as needed for chest pain.   omeprazole 40 MG capsule Commonly known as: PRILOSEC Take 1 capsule (40 mg total)  by mouth 2 (two) times daily before a meal.   onetouch ultrasoft lancets Test BS BID Dx E11.9   Vitamin D3 50 MCG (2000 UT) Tabs Take 4,000 Units by mouth daily.       Signature:  Chesley Mires, MD [place] Pager - [phone] 10/20/2019, 11:25 AM

## 2019-10-20 NOTE — Patient Instructions (Signed)
Will get copy of your CPAP report  High dose flu shot today  Follow up in 1 year

## 2019-10-28 ENCOUNTER — Telehealth: Payer: Self-pay | Admitting: Family

## 2019-10-28 DIAGNOSIS — L989 Disorder of the skin and subcutaneous tissue, unspecified: Secondary | ICD-10-CM

## 2019-10-28 DIAGNOSIS — G4733 Obstructive sleep apnea (adult) (pediatric): Secondary | ICD-10-CM | POA: Diagnosis not present

## 2019-10-28 NOTE — Telephone Encounter (Signed)
REFERRAL REQUEST Telephone Note  Have you been seen at our office for this problem? Yes (Advise that they may need an appointment with their PCP before a referral can be done)  Reason for Referral: Skin Lesion on left side of nose with a history Basal Cell Cancer on Nose Referral discussed with patient: Yes Best contact number of patient for referral team:    Has patient been seen by a specialist for this issue before: Yes but in [place] Patient provider preference for referral: Not past [facility]  Patient location preference for referral: Same as above   Patient notified that referrals can take up to a week or longer to process. If they haven't heard anything within a week they should call back and speak with the referral department.

## 2019-10-31 NOTE — Telephone Encounter (Signed)
Referral placed.

## 2019-11-15 DIAGNOSIS — J449 Chronic obstructive pulmonary disease, unspecified: Secondary | ICD-10-CM | POA: Diagnosis not present

## 2019-11-15 DIAGNOSIS — E119 Type 2 diabetes mellitus without complications: Secondary | ICD-10-CM | POA: Diagnosis not present

## 2019-11-15 DIAGNOSIS — K449 Diaphragmatic hernia without obstruction or gangrene: Secondary | ICD-10-CM | POA: Diagnosis not present

## 2019-11-15 DIAGNOSIS — G4730 Sleep apnea, unspecified: Secondary | ICD-10-CM | POA: Diagnosis not present

## 2019-11-15 DIAGNOSIS — K219 Gastro-esophageal reflux disease without esophagitis: Secondary | ICD-10-CM | POA: Diagnosis not present

## 2019-11-15 DIAGNOSIS — R1310 Dysphagia, unspecified: Secondary | ICD-10-CM | POA: Diagnosis not present

## 2019-11-15 DIAGNOSIS — Z85828 Personal history of other malignant neoplasm of skin: Secondary | ICD-10-CM | POA: Diagnosis not present

## 2019-11-15 DIAGNOSIS — K2210 Ulcer of esophagus without bleeding: Secondary | ICD-10-CM | POA: Diagnosis not present

## 2019-11-15 DIAGNOSIS — K222 Esophageal obstruction: Secondary | ICD-10-CM | POA: Diagnosis not present

## 2019-11-15 DIAGNOSIS — I2510 Atherosclerotic heart disease of native coronary artery without angina pectoris: Secondary | ICD-10-CM | POA: Diagnosis not present

## 2019-11-15 DIAGNOSIS — I10 Essential (primary) hypertension: Secondary | ICD-10-CM | POA: Diagnosis not present

## 2019-11-28 DIAGNOSIS — G4733 Obstructive sleep apnea (adult) (pediatric): Secondary | ICD-10-CM | POA: Diagnosis not present

## 2019-11-30 DIAGNOSIS — C44311 Basal cell carcinoma of skin of nose: Secondary | ICD-10-CM | POA: Diagnosis not present

## 2019-11-30 DIAGNOSIS — D485 Neoplasm of uncertain behavior of skin: Secondary | ICD-10-CM | POA: Diagnosis not present

## 2019-12-15 IMAGING — NM NM MYOCAR MULTI W/SPECT W/WALL MOTION & EF
2 series · 12 of 12 positions shown · non-contrast
Comparison: none

[Series 1: rest · 6.51mm/px · 6 of 64 frames shown]
[frame 6/64]
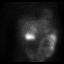
[frame 16/64]
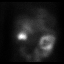
[frame 27/64]
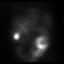
[frame 38/64]
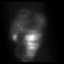
[frame 48/64]
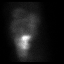
[frame 59/64]
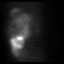

[Series 3: stress gated - perfusion · 6.51mm/px · 6 of 64 frames shown]
[frame 6/64]
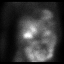
[frame 16/64]
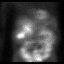
[frame 27/64]
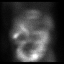
[frame 38/64]
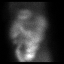
[frame 48/64]
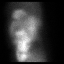
[frame 59/64]
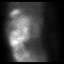

[12 of 12 positions shown; findings below may reference images not displayed]

Canned report from images found in remote index.

Refer to host system for actual result text.

## 2019-12-19 DIAGNOSIS — Z85828 Personal history of other malignant neoplasm of skin: Secondary | ICD-10-CM | POA: Diagnosis not present

## 2019-12-19 DIAGNOSIS — C44311 Basal cell carcinoma of skin of nose: Secondary | ICD-10-CM | POA: Diagnosis not present

## 2019-12-28 DIAGNOSIS — G4733 Obstructive sleep apnea (adult) (pediatric): Secondary | ICD-10-CM | POA: Diagnosis not present

## 2020-01-19 ENCOUNTER — Telehealth: Payer: Self-pay | Admitting: *Deleted

## 2020-01-19 NOTE — Chronic Care Management (AMB) (Signed)
  Chronic Care Management   Note  01/19/2020 Name: Erica Braun MRN: [id number] DOB: 1950-12-01  Erica Braun is a 70 y.o. year old female who is a primary care patient of Sharion Balloon, [name]. I reached out to [facility] by phone today in response to a referral sent by Ms. Erica File Wedig's health plan.     [name] was given information about Chronic Care Management services today including:  1. CCM service includes personalized support from designated clinical staff supervised by her physician, including individualized plan of care and coordination with other care providers 2. 24/7 contact phone numbers for assistance for urgent and routine care needs. 3. Service will only be billed when office clinical staff spend 20 minutes or more in a month to coordinate care. 4. Only one practitioner may furnish and bill the service in a calendar month. 5. The patient may stop CCM services at any time (effective at the end of the month) by phone call to the office staff. 6. The patient will be responsible for cost sharing (co-pay) of up to 20% of the service fee (after annual deductible is met).  Patient agreed to services and verbal consent obtained.   Follow up plan: Telephone appointment with care management team member scheduled for:02/03/2020  [facility]

## 2020-02-03 ENCOUNTER — Ambulatory Visit: Payer: Medicare HMO | Admitting: *Deleted

## 2020-02-03 DIAGNOSIS — C159 Malignant neoplasm of esophagus, unspecified: Secondary | ICD-10-CM

## 2020-02-03 DIAGNOSIS — E1122 Type 2 diabetes mellitus with diabetic chronic kidney disease: Secondary | ICD-10-CM

## 2020-02-03 DIAGNOSIS — E119 Type 2 diabetes mellitus without complications: Secondary | ICD-10-CM

## 2020-02-03 NOTE — Patient Instructions (Signed)
Plan:  . The patient has been provided with contact information for the care management team and has been advised to call if they would like to re-enroll in the CCM program to receive care management and care coordination services.  . CCM enrollment status changed to "previously enrolled" on 02/03/2020  to discontinue enrollment. Case closed to case management services in primary care home.  . Patient was provided with general disease management education and encouraged to follow-up with their PCP and specialists as recommended.   Chong Sicilian, BSN, RN-BC Embedded Chronic Care Manager [facility] / [facility] Direct Dial: [phone]

## 2020-02-03 NOTE — Chronic Care Management (AMB) (Signed)
  Chronic Care Management   Initial Visit Note  02/03/2020 Name: Erica Braun MRN: [id number] DOB: 11/03/1950  Referred by: Sharion Balloon, [name] Reason for referral : Chronic Care Management (Initial Visit)   Erica Braun is a 70 y.o. year old female who is a primary care patient of Sharion Balloon, [name]. The CCM team was consulted for assistance with chronic disease management and care coordination needs related to HTN, HLD, COPD and DM  Review of patient status, including review of consultants reports, relevant laboratory and other test results was performed prior to outreach.   I spoke with Erica Braun by telephone today regarding management of their chronic medical conditions. They do not have any CCM or resource needs and feel that their medical conditions are well managed. They appreciated the outreach, but do not feel that CCM services are needed at this time. They will reach out in the future if a need arises.   SDOH (Social Determinants of Health) assessments performed: Yes See Care Plan activities for detailed interventions related to SDOH     Plan:  . The patient has been provided with contact information for the care management team and has been advised to call if they would like to re-enroll in the CCM program to receive care management and care coordination services.  . CCM enrollment status changed to "previously enrolled" as per patient request on 02/03/2020  to discontinue enrollment. Case closed to case management services in primary care home.  . Patient was provided with general disease management education and encouraged to follow-up with their PCP and specialists as recommended.   Chong Sicilian, BSN, RN-BC Embedded Chronic Care Manager [facility] Direct Dial: [phone]

## 2020-02-21 DIAGNOSIS — R1310 Dysphagia, unspecified: Secondary | ICD-10-CM | POA: Diagnosis not present

## 2020-02-21 DIAGNOSIS — E119 Type 2 diabetes mellitus without complications: Secondary | ICD-10-CM | POA: Diagnosis not present

## 2020-02-21 DIAGNOSIS — K449 Diaphragmatic hernia without obstruction or gangrene: Secondary | ICD-10-CM | POA: Diagnosis not present

## 2020-02-21 DIAGNOSIS — E1140 Type 2 diabetes mellitus with diabetic neuropathy, unspecified: Secondary | ICD-10-CM | POA: Diagnosis not present

## 2020-02-21 DIAGNOSIS — J449 Chronic obstructive pulmonary disease, unspecified: Secondary | ICD-10-CM | POA: Diagnosis not present

## 2020-02-21 DIAGNOSIS — K2210 Ulcer of esophagus without bleeding: Secondary | ICD-10-CM | POA: Diagnosis not present

## 2020-02-21 DIAGNOSIS — Z7984 Long term (current) use of oral hypoglycemic drugs: Secondary | ICD-10-CM | POA: Diagnosis not present

## 2020-02-21 DIAGNOSIS — K219 Gastro-esophageal reflux disease without esophagitis: Secondary | ICD-10-CM | POA: Diagnosis not present

## 2020-02-21 DIAGNOSIS — Z8501 Personal history of malignant neoplasm of esophagus: Secondary | ICD-10-CM | POA: Diagnosis not present

## 2020-02-21 DIAGNOSIS — I2510 Atherosclerotic heart disease of native coronary artery without angina pectoris: Secondary | ICD-10-CM | POA: Diagnosis not present

## 2020-02-21 DIAGNOSIS — I10 Essential (primary) hypertension: Secondary | ICD-10-CM | POA: Diagnosis not present

## 2020-02-21 DIAGNOSIS — G4730 Sleep apnea, unspecified: Secondary | ICD-10-CM | POA: Diagnosis not present

## 2020-02-21 DIAGNOSIS — K222 Esophageal obstruction: Secondary | ICD-10-CM | POA: Diagnosis not present

## 2020-03-01 DIAGNOSIS — G4733 Obstructive sleep apnea (adult) (pediatric): Secondary | ICD-10-CM | POA: Diagnosis not present

## 2020-03-29 DIAGNOSIS — G4733 Obstructive sleep apnea (adult) (pediatric): Secondary | ICD-10-CM | POA: Diagnosis not present

## 2020-04-29 DIAGNOSIS — G4733 Obstructive sleep apnea (adult) (pediatric): Secondary | ICD-10-CM | POA: Diagnosis not present

## 2020-08-19 ENCOUNTER — Other Ambulatory Visit: Payer: Self-pay | Admitting: Family

## 2020-08-19 DIAGNOSIS — K2100 Gastro-esophageal reflux disease with esophagitis, without bleeding: Secondary | ICD-10-CM

## 2020-10-15 ENCOUNTER — Other Ambulatory Visit: Payer: Self-pay | Admitting: Family

## 2020-10-15 ENCOUNTER — Other Ambulatory Visit: Payer: Self-pay | Admitting: Pulmonary Disease

## 2020-10-15 DIAGNOSIS — K2100 Gastro-esophageal reflux disease with esophagitis, without bleeding: Secondary | ICD-10-CM

## 2020-10-16 ENCOUNTER — Other Ambulatory Visit (HOSPITAL_COMMUNITY): Payer: Self-pay | Admitting: Family

## 2020-10-16 DIAGNOSIS — Z1231 Encounter for screening mammogram for malignant neoplasm of breast: Secondary | ICD-10-CM

## 2020-10-16 NOTE — Telephone Encounter (Signed)
[facility]. NTBS 30 days given 08/19/20 Last OV 09/01/19

## 2020-10-24 ENCOUNTER — Encounter (HOSPITAL_COMMUNITY): Payer: Self-pay

## 2020-10-24 ENCOUNTER — Ambulatory Visit (HOSPITAL_COMMUNITY)
Admission: RE | Admit: 2020-10-24 | Discharge: 2020-10-24 | Disposition: A | Discharge status: Home / Self Care | Payer: Medicare HMO | Source: Ambulatory Visit | Attending: Family | Admitting: Family

## 2020-10-24 ENCOUNTER — Other Ambulatory Visit: Payer: Self-pay

## 2020-10-24 DIAGNOSIS — Z1231 Encounter for screening mammogram for malignant neoplasm of breast: Secondary | ICD-10-CM | POA: Diagnosis not present

## 2020-10-25 ENCOUNTER — Ambulatory Visit (INDEPENDENT_AMBULATORY_CARE_PROVIDER_SITE_OTHER): Payer: Medicare HMO | Admitting: Family

## 2020-10-25 ENCOUNTER — Encounter: Payer: Self-pay | Admitting: Family

## 2020-10-25 VITALS — BP 105/72 | HR 88 | Temp 97.3°F | Ht 62.0 in | Wt 146.0 lb

## 2020-10-25 DIAGNOSIS — I129 Hypertensive chronic kidney disease with stage 1 through stage 4 chronic kidney disease, or unspecified chronic kidney disease: Secondary | ICD-10-CM

## 2020-10-25 DIAGNOSIS — E119 Type 2 diabetes mellitus without complications: Secondary | ICD-10-CM | POA: Diagnosis not present

## 2020-10-25 DIAGNOSIS — Z0000 Encounter for general adult medical examination without abnormal findings: Secondary | ICD-10-CM

## 2020-10-25 DIAGNOSIS — K2100 Gastro-esophageal reflux disease with esophagitis, without bleeding: Secondary | ICD-10-CM | POA: Diagnosis not present

## 2020-10-25 DIAGNOSIS — E1159 Type 2 diabetes mellitus with other circulatory complications: Secondary | ICD-10-CM | POA: Diagnosis not present

## 2020-10-25 DIAGNOSIS — I2510 Atherosclerotic heart disease of native coronary artery without angina pectoris: Secondary | ICD-10-CM | POA: Diagnosis not present

## 2020-10-25 DIAGNOSIS — E785 Hyperlipidemia, unspecified: Secondary | ICD-10-CM | POA: Diagnosis not present

## 2020-10-25 DIAGNOSIS — I152 Hypertension secondary to endocrine disorders: Secondary | ICD-10-CM

## 2020-10-25 DIAGNOSIS — Z23 Encounter for immunization: Secondary | ICD-10-CM | POA: Diagnosis not present

## 2020-10-25 DIAGNOSIS — Z0001 Encounter for general adult medical examination with abnormal findings: Secondary | ICD-10-CM

## 2020-10-25 DIAGNOSIS — J431 Panlobular emphysema: Secondary | ICD-10-CM | POA: Diagnosis not present

## 2020-10-25 DIAGNOSIS — E1169 Type 2 diabetes mellitus with other specified complication: Secondary | ICD-10-CM

## 2020-10-25 DIAGNOSIS — G4733 Obstructive sleep apnea (adult) (pediatric): Secondary | ICD-10-CM

## 2020-10-25 DIAGNOSIS — E1122 Type 2 diabetes mellitus with diabetic chronic kidney disease: Secondary | ICD-10-CM | POA: Diagnosis not present

## 2020-10-25 DIAGNOSIS — Z9989 Dependence on other enabling machines and devices: Secondary | ICD-10-CM

## 2020-10-25 LAB — CMP14+EGFR
ALT: 12 IU/L (ref 0–32)
AST: 12 IU/L (ref 0–40)
Albumin/Globulin Ratio: 2 (ref 1.2–2.2)
Albumin: 4.3 g/dL (ref 3.7–4.7)
Alkaline Phosphatase: 124 IU/L — ABNORMAL HIGH (ref 44–121)
BUN/Creatinine Ratio: 16 (ref 12–28)
BUN: 12 mg/dL (ref 8–27)
Bilirubin Total: 0.3 mg/dL (ref 0.0–1.2)
CO2: 25 mmol/L (ref 20–29)
Calcium: 9.8 mg/dL (ref 8.7–10.3)
Chloride: 98 mmol/L (ref 96–106)
Creatinine, Ser: 0.74 mg/dL (ref 0.57–1.00)
Globulin, Total: 2.1 g/dL (ref 1.5–4.5)
Glucose: 206 mg/dL — ABNORMAL HIGH (ref 70–99)
Potassium: 4 mmol/L (ref 3.5–5.2)
Sodium: 136 mmol/L (ref 134–144)
Total Protein: 6.4 g/dL (ref 6.0–8.5)
eGFR: 86 mL/min/{1.73_m2} (ref >59)

## 2020-10-25 LAB — CBC WITH DIFFERENTIAL/PLATELET
Basophils Absolute: 0 10*3/uL (ref 0.0–0.2)
Basos: 1 %
EOS (ABSOLUTE): 0.1 10*3/uL (ref 0.0–0.4)
Eos: 2 %
Hematocrit: 31.9 % — ABNORMAL LOW (ref 34.0–46.6)
Hemoglobin: 10.4 g/dL — ABNORMAL LOW (ref 11.1–15.9)
Immature Grans (Abs): 0 10*3/uL (ref 0.0–0.1)
Immature Granulocytes: 0 %
Lymphocytes Absolute: 0.6 10*3/uL — ABNORMAL LOW (ref 0.7–3.1)
Lymphs: 19 %
MCH: 28.2 pg (ref 26.6–33.0)
MCHC: 32.6 g/dL (ref 31.5–35.7)
MCV: 86 fL (ref 79–97)
Monocytes Absolute: 0.3 10*3/uL (ref 0.1–0.9)
Monocytes: 10 %
Neutrophils Absolute: 2.3 10*3/uL (ref 1.4–7.0)
Neutrophils: 68 %
Platelets: 153 10*3/uL (ref 150–450)
RBC: 3.69 x10E6/uL — ABNORMAL LOW (ref 3.77–5.28)
RDW: 14 % (ref 11.7–15.4)
WBC: 3.4 10*3/uL (ref 3.4–10.8)

## 2020-10-25 LAB — LIPID PANEL
Chol/HDL Ratio: 3.9 ratio (ref 0.0–4.4)
Cholesterol, Total: 203 mg/dL — ABNORMAL HIGH (ref 100–199)
HDL: 52 mg/dL (ref >39)
LDL Chol Calc (NIH): 124 mg/dL — ABNORMAL HIGH (ref 0–99)
Triglycerides: 154 mg/dL — ABNORMAL HIGH (ref 0–149)
VLDL Cholesterol Cal: 27 mg/dL (ref 5–40)

## 2020-10-25 LAB — BAYER DCA HB A1C WAIVED: HB A1C (BAYER DCA - WAIVED): 7 % — ABNORMAL HIGH (ref 4.8–5.6)

## 2020-10-25 MED ORDER — OMEPRAZOLE 40 MG PO CPDR: 40.0000 mg | DELAYED_RELEASE_CAPSULE | Freq: Two times a day (BID) | ORAL | 1 refills | Status: DC

## 2020-10-25 MED ORDER — METFORMIN HCL 1000 MG PO TABS: 1000.0000 mg | ORAL_TABLET | Freq: Two times a day (BID) | ORAL | 1 refills | Status: DC

## 2020-10-25 NOTE — Progress Notes (Signed)
Subjective:    Patient ID: Erica Braun, female    DOB: Nov 06, 1950, 70 y.o.   MRN: [id number]  Chief Complaint  Patient presents with   Medical Management of Chronic Issues   Pt presents to the office today for CPE and chronic follow up. She is followed by Cardiologists for CAD, pulmonologist's for COPD, and GI for esophageal dysphagia and hx of squamous cell esophageal cancer. Hypertension This is a chronic problem. The current episode started more than 1 year ago. The problem has been gradually worsening since onset. The problem is controlled. Pertinent negatives include no blurred vision, malaise/fatigue, peripheral edema or shortness of breath. Risk factors for coronary artery disease include diabetes mellitus, dyslipidemia and sedentary lifestyle. The current treatment provides moderate improvement.  Gastroesophageal Reflux She complains of belching, choking, dysphagia, early satiety and heartburn. This is a chronic problem. The current episode started more than 1 year ago. The problem occurs frequently. She has tried a PPI for the symptoms. The treatment provided mild relief.  Diabetes She presents for her follow-up diabetic visit. She has type 2 diabetes mellitus. There are no hypoglycemic associated symptoms. Pertinent negatives for diabetes include no blurred vision and no foot paresthesias. Symptoms are stable. Diabetic complications include heart disease. Risk factors for coronary artery disease include dyslipidemia, diabetes mellitus, hypertension, sedentary lifestyle and post-menopausal. She is following a generally unhealthy diet. Her overall blood glucose range is 180-200 mg/dl.   OSA Uses CPAP nightly. Stable.  COPD States her breathing is stable and using Anoro daily.   Review of Systems  Constitutional:  Negative for malaise/fatigue.  Eyes:  Negative for blurred vision.  Respiratory:  Positive for choking. Negative for shortness of breath.   Gastrointestinal:   Positive for dysphagia and heartburn.  All other systems reviewed and are negative.  Family History  Problem Relation Age of Onset   Cancer Mother        breast   Arthritis Mother    COPD Father 57       emphysema   Alcohol abuse Father    Diabetes Father    Cancer Sister        breast   Early death Brother        overdose   Drug abuse Brother    Alcohol abuse Paternal Aunt    COPD Paternal Aunt    Cancer Maternal Grandmother        liver cancer   Diabetes Maternal Grandmother    Alcohol abuse Maternal Grandfather    Early death Maternal Grandfather    Stroke Paternal Grandmother    Heart disease Paternal Grandfather    Esophageal cancer Neg Hx    Stomach cancer Neg Hx    Social History   Socioeconomic History   Marital status: Married    Spouse name: [name]   Number of children: 3   Years of education: 32   Highest education level: Some college, no degree  Occupational History   Occupation: retired    Comment: Advertising account planner  Tobacco Use   Smoking status: Former    Packs/day: 1.50    Years: 41.00    Pack years: 61.50    Types: Cigarettes    Start date: 1969    Quit date: 2010    Years since quitting: 12.8   Smokeless tobacco: Never  Vaping Use   Vaping Use: Never used  Substance and Sexual Activity   Alcohol use: No   Drug use: No   Sexual activity: Yes  Birth control/protection: Post-menopausal  Other Topics Concern   Not on file  Social History Narrative   Recent move from [place] to walk   Married to [employer] dog at home   Social Determinants of Radio broadcast assistant Strain: Not on [employer] Insecurity: Not on file  Transportation Needs: Not on file  Physical Activity: Not on file  Stress: Not on file  Social Connections: Not on file       Objective:   Physical Exam Vitals reviewed.  Constitutional:      General: She is not in acute distress.    Appearance: She is well-developed.  HENT:     Head: Normocephalic and  atraumatic.     Right Ear: Tympanic membrane normal.     Left Ear: Tympanic membrane normal.  Eyes:     Pupils: Pupils are equal, round, and reactive to light.  Neck:     Thyroid: No thyromegaly.  Cardiovascular:     Rate and Rhythm: Normal rate and regular rhythm.     Heart sounds: Normal heart sounds. No murmur heard. Pulmonary:     Effort: Pulmonary effort is normal. No respiratory distress.     Breath sounds: Normal breath sounds. No wheezing.  Abdominal:     General: Bowel sounds are normal. There is no distension.     Palpations: Abdomen is soft.     Tenderness: There is no abdominal tenderness.  Musculoskeletal:        General: No tenderness. Normal range of motion.     Cervical back: Normal range of motion and neck supple.  Skin:    General: Skin is warm and dry.  Neurological:     Mental Status: She is alert and oriented to person, place, and time.     Cranial Nerves: No cranial nerve deficit.     Deep Tendon Reflexes: Reflexes are normal and symmetric.  Psychiatric:        Behavior: Behavior normal.        Thought Content: Thought content normal.        Judgment: Judgment normal.      BP 105/72   Pulse 88   Temp (!) 97.3 F (36.3 C) (Temporal)   Ht _0  (1.575 m)   Wt 146 lb (66.2 kg)   BMI 26.70 kg/m      Assessment & Plan:  Erica Braun comes in today with chief complaint of Medical Management of Chronic Issues   Diagnosis and orders addressed:  1. Type 2 diabetes mellitus without complication, without long-term current use of insulin (HCC)  - CBC with Differential/Platelet - Bayer DCA Hb A1c Waived - metFORMIN (GLUCOPHAGE) 1000 MG tablet; Take 1 tablet (1,000 mg total) by mouth 2 (two) times daily with a meal.  Dispense: 180 tablet; Refill: 1  2. Hypertension associated with chronic kidney disease due to type 2 diabetes mellitus ([place])  - CBC with Differential/Platelet - CMP14+EGFR  3. Hyperlipidemia associated with type 2 diabetes  mellitus ([place]) - Lipid panel  4. Gastroesophageal reflux disease with esophagitis without hemorrhage - omeprazole (PRILOSEC) 40 MG capsule; Take 1 capsule (40 mg total) by mouth 2 (two) times daily before a meal. (NEEDS TO BE SEEN BEFORE NEXT REFILL)  Dispense: 180 capsule; Refill: 1  5. Annual physical exam - TSH  6. OSA on CPAP  7. Panlobular emphysema ([place])  8. CAD in native artery  9. Hypertension associated with diabetes ([place])   Labs  pending Health Maintenance reviewed Diet and exercise encouraged  Follow up plan: 6 months    Evelina Dun, [name]

## 2020-10-25 NOTE — Patient Instructions (Signed)
Diabetes Mellitus and Nutrition, Adult When you have diabetes, or diabetes mellitus, it is very important to have healthy eating habits because your blood sugar (glucose) levels are greatly affected by what you eat and drink. Eating healthy foods in the right amounts, at about the same times every day, can help you:  Control your blood glucose.  Lower your risk of heart disease.  Improve your blood pressure.  Reach or maintain a healthy weight. What can affect my meal plan? Every person with diabetes is different, and each person has different needs for a meal plan. Your health care provider may recommend that you work with a dietitian to make a meal plan that is best for you. Your meal plan may vary depending on factors such as:  The calories you need.  The medicines you take.  Your weight.  Your blood glucose, blood pressure, and cholesterol levels.  Your activity level.  Other health conditions you have, such as heart or kidney disease. How do carbohydrates affect me? Carbohydrates, also called carbs, affect your blood glucose level more than any other type of food. Eating carbs naturally raises the amount of glucose in your blood. Carb counting is a method for keeping track of how many carbs you eat. Counting carbs is important to keep your blood glucose at a healthy level, especially if you use insulin or take certain oral diabetes medicines. It is important to know how many carbs you can safely have in each meal. This is different for every person. Your dietitian can help you calculate how many carbs you should have at each meal and for each snack. How does alcohol affect me? Alcohol can cause a sudden decrease in blood glucose (hypoglycemia), especially if you use insulin or take certain oral diabetes medicines. Hypoglycemia can be a life-threatening condition. Symptoms of hypoglycemia, such as sleepiness, dizziness, and confusion, are similar to symptoms of having too much  alcohol.  Do not drink alcohol if: ? Your health care provider tells you not to drink. ? You are pregnant, may be pregnant, or are planning to become pregnant.  If you drink alcohol: ? Do not drink on an empty stomach. ? Limit how much you use to:  0-1 drink a day for women.  0-2 drinks a day for men. ? Be aware of how much alcohol is in your drink. In the [place] one drink equals one 12 oz bottle of beer (355 mL), one 5 oz glass of wine (148 mL), or one 1 oz glass of hard liquor (44 mL). ? Keep yourself hydrated with water, diet soda, or unsweetened iced tea.  Keep in mind that regular soda, juice, and other mixers may contain a lot of sugar and must be counted as carbs. What are tips for following this plan? Reading food labels  Start by checking the serving size on the "Nutrition Facts" label of packaged foods and drinks. The amount of calories, carbs, fats, and other nutrients listed on the label is based on one serving of the item. Many items contain more than one serving per package.  Check the total grams (g) of carbs in one serving. You can calculate the number of servings of carbs in one serving by dividing the total carbs by 15. For example, if a food has 30 g of total carbs per serving, it would be equal to 2 servings of carbs.  Check the number of grams (g) of saturated fats and trans fats in one serving. Choose foods that have   a low amount or none of these fats.  Check the number of milligrams (mg) of salt (sodium) in one serving. Most people should limit total sodium intake to less than 2,300 mg per day.  Always check the nutrition information of foods labeled as "low-fat" or "nonfat." These foods may be higher in added sugar or refined carbs and should be avoided.  Talk to your dietitian to identify your daily goals for nutrients listed on the label. Shopping  Avoid buying canned, pre-made, or processed foods. These foods tend to be high in fat, sodium, and added  sugar.  Shop around the outside edge of the grocery store. This is where you will most often find fresh fruits and vegetables, bulk grains, fresh meats, and fresh dairy. Cooking  Use low-heat cooking methods, such as baking, instead of high-heat cooking methods like deep frying.  Cook using healthy oils, such as olive, canola, or sunflower oil.  Avoid cooking with butter, cream, or high-fat meats. Meal planning  Eat meals and snacks regularly, preferably at the same times every day. Avoid going long periods of time without eating.  Eat foods that are high in fiber, such as fresh fruits, vegetables, beans, and whole grains. Talk with your dietitian about how many servings of carbs you can eat at each meal.  Eat 4-6 oz (112-168 g) of lean protein each day, such as lean meat, chicken, fish, eggs, or tofu. One ounce (oz) of lean protein is equal to: ? 1 oz (28 g) of meat, chicken, or fish. ? 1 egg. ?  cup (62 g) of tofu.  Eat some foods each day that contain healthy fats, such as avocado, nuts, seeds, and fish.   What foods should I eat? Fruits Berries. Apples. Oranges. Peaches. Apricots. Plums. Grapes. Mango. Papaya. Pomegranate. Kiwi. Cherries. Vegetables Lettuce. Spinach. Leafy greens, including kale, chard, collard greens, and mustard greens. Beets. Cauliflower. Cabbage. Broccoli. Carrots. Green beans. Tomatoes. Peppers. Onions. Cucumbers. Brussels sprouts. Grains Whole grains, such as whole-wheat or whole-grain bread, crackers, tortillas, cereal, and pasta. Unsweetened oatmeal. Quinoa. Brown or wild rice. Meats and other proteins Seafood. Poultry without skin. Lean cuts of poultry and beef. Tofu. Nuts. Seeds. Dairy Low-fat or fat-free dairy products such as milk, yogurt, and cheese. The items listed above may not be a complete list of foods and beverages you can eat. Contact a dietitian for more information. What foods should I avoid? Fruits Fruits canned with  syrup. Vegetables Canned vegetables. Frozen vegetables with butter or cream sauce. Grains Refined white flour and flour products such as bread, pasta, snack foods, and cereals. Avoid all processed foods. Meats and other proteins Fatty cuts of meat. Poultry with skin. Breaded or fried meats. Processed meat. Avoid saturated fats. Dairy Full-fat yogurt, cheese, or milk. Beverages Sweetened drinks, such as soda or iced tea. The items listed above may not be a complete list of foods and beverages you should avoid. Contact a dietitian for more information. Questions to ask a health care provider  Do I need to meet with a diabetes educator?  Do I need to meet with a dietitian?  What number can I call if I have questions?  When are the best times to check my blood glucose? Where to find more information:  American Diabetes Association: diabetes.org  Academy of Nutrition and Dietetics: www.eatright.org  National Institute of Diabetes and Digestive and Kidney Diseases: www.niddk.nih.gov  Association of Diabetes Care and Education Specialists: www.diabeteseducator.org Summary  It is important to have healthy eating   habits because your blood sugar (glucose) levels are greatly affected by what you eat and drink.  A healthy meal plan will help you control your blood glucose and maintain a healthy lifestyle.  Your health care provider may recommend that you work with a dietitian to make a meal plan that is best for you.  Keep in mind that carbohydrates (carbs) and alcohol have immediate effects on your blood glucose levels. It is important to count carbs and to use alcohol carefully. This information is not intended to replace advice given to you by your health care provider. Make sure you discuss any questions you have with your health care provider. Document Revised: 11/30/2018 Document Reviewed: 11/30/2018 Elsevier Patient Education  2021 Elsevier Inc.  

## 2020-10-27 LAB — SPECIMEN STATUS REPORT

## 2020-10-27 LAB — TSH: TSH: 1.78 u[IU]/mL (ref 0.450–4.500)

## 2020-11-07 ENCOUNTER — Other Ambulatory Visit: Payer: Self-pay | Admitting: Family

## 2020-11-07 DIAGNOSIS — I129 Hypertensive chronic kidney disease with stage 1 through stage 4 chronic kidney disease, or unspecified chronic kidney disease: Secondary | ICD-10-CM

## 2020-11-19 ENCOUNTER — Encounter: Payer: Self-pay | Admitting: Pulmonary Disease

## 2020-11-19 ENCOUNTER — Ambulatory Visit (HOSPITAL_COMMUNITY)
Admission: RE | Admit: 2020-11-19 | Discharge: 2020-11-19 | Disposition: A | Discharge status: Home / Self Care | Payer: Medicare HMO | Source: Ambulatory Visit | Attending: Pulmonary Disease | Admitting: Pulmonary Disease

## 2020-11-19 ENCOUNTER — Other Ambulatory Visit: Payer: Self-pay

## 2020-11-19 ENCOUNTER — Ambulatory Visit: Payer: Medicare HMO | Admitting: Pulmonary Disease

## 2020-11-19 VITALS — BP 126/82 | HR 102 | Temp 98.0°F | Ht 61.0 in | Wt 147.1 lb

## 2020-11-19 DIAGNOSIS — R0789 Other chest pain: Secondary | ICD-10-CM | POA: Diagnosis not present

## 2020-11-19 DIAGNOSIS — J432 Centrilobular emphysema: Secondary | ICD-10-CM

## 2020-11-19 DIAGNOSIS — R0600 Dyspnea, unspecified: Secondary | ICD-10-CM | POA: Diagnosis not present

## 2020-11-19 DIAGNOSIS — G4733 Obstructive sleep apnea (adult) (pediatric): Secondary | ICD-10-CM | POA: Diagnosis not present

## 2020-11-19 DIAGNOSIS — R0609 Other forms of dyspnea: Secondary | ICD-10-CM

## 2020-11-19 NOTE — Patient Instructions (Signed)
Chest xray today  You can look up CPAP mask strap cushions or different mask options at CPAP.com or similar website  Call if you need help getting follow up appointment with Cardiology  Follow up in 1 year

## 2020-11-19 NOTE — Progress Notes (Signed)
Erica Braun, Erica Braun, Erica Braun  Chief Complaint  Patient presents with   Follow-up    Cpap working well. Mask is uncomfortable.     Past Surgical History:  She  has a past surgical history that includes Abdominal hysterectomy; Breast biopsy (Left); LEFT HEART CATH Erica CORONARY ANGIOGRAPHY (N/A, 06/11/2017); Breast lumpectomy (Left); Esophagogastroduodenoscopy (egd) with propofol (N/A, 05/03/2018); biopsy (05/03/2018); EUS (05/03/2018); Esophagogastroduodenoscopy (egd) with propofol (N/A, 11/18/2018); Erica Foreign Body Removal (11/18/2018).  Past Medical History:  CAD, DM type 2, Esophageal cancer, Diverticulosis, Esophageal stricture, Cataracts, GERD, HLD, HTN, PNA, COVID 25 November 2018  Constitutional:  BP 126/82   Pulse (!) 102   Temp 98 F (36.7 C)   Ht 5\' 1"  (1.549 m)   Wt 147 lb 1.9 oz (66.7 kg)   SpO2 98% Comment: ra  BMI 27.80 kg/m   Brief Summary:  Erica Braun is a 70 y.o. female former smoker with COPD Erica obstructive sleep apnea.       Subjective:   She has noticed feeling more discomfort in her mid chest.  She thinks this is same feeling she had when she was diagnosed with esophageal cancer.  She has appointment with GI at [facility] scheduled.  She also feels short of breath when she gets chest discomfort.  She has noticed more sensitivity with her breathing around strong smells (smoke, perfume, dust).  She has been using her albuterol more often when she goes to the mall or works in her yard.  Not having wheeze, sputum, or hemoptysis.  She uses CPAP nightly.  Not having sinus congestion or sore throat.  Her mask straps have been putting pressure on the side of her head.  Physical Exam:   Appearance - well kempt   ENMT - no sinus tenderness, no oral exudate, no LAN, Mallampati 2 airway, no stridor, wears dentures, elongated uvula  Respiratory - equal breath sounds bilaterally, no wheezing or rales  CV - s1s2 regular rate Erica  rhythm, no murmurs  Ext - no clubbing, no edema  Skin - no rashes  Psych - normal mood Erica affect    Braun testing:  Spirometry 03/02/15 >> FEV1 1.23 (57%), FEV1% 50 PFT 12/11/17 >> FEV1 0.98 (47%), FEV1% 60, TLC 108%, DLCO 59% IgE 12/11/17 >> 91 A1AT 12/13/17 >> 179, MM  Chest Imaging:  CT chest 04/23/18 >> mild centrilobular emphysema, scarring in lingular Erica RML  Sleep Tests:  PSG 11/15/17 >> AHI 11.1, SpO2 low 74% CPAP 10/17/20 to 11/15/20 >> used on 30 of 30 nights with average 6 hrs 58 min.  Average AHI 0.1 with CPAP 11 cm H2O  Cardiac Tests:  LHC 06/11/17 >> EF 55%, nl LVEDP, patent Lt main Erica circumflex stent, chronic occlusion of mid RCA  Social History:  She  reports that she quit smoking about 12 years ago. Her smoking use included cigarettes. She started smoking about 53 years ago. She has a 61.50 pack-year smoking history. She has never used smokeless tobacco. She reports that she does not drink alcohol Erica does not use drugs.  Family History:  Her family history includes Alcohol abuse in her father, maternal grandfather, Erica paternal aunt; Arthritis in her mother; COPD in her paternal aunt; COPD (age of onset: 66) in her father; Cancer in her maternal grandmother, mother, Erica sister; Diabetes in her father Erica maternal grandmother; Drug abuse in her brother; Early death in her brother Erica maternal grandfather; Heart disease in her paternal grandfather; Stroke in her  paternal grandmother.      Assessment/Plan:   Atypical chest pain. - chest xray today - she will schedule follow up with cardiology Erica has GI follow up scheduled  COPD with emphysema. - continue anoro - prn albuterol; explained she could use albuterol as pretreatment also if she is going to be in situation with know triggers for her respiratory symptoms - if her breathing symptoms progress, then consider adding ICS to LABA/LAMA  Obstructive sleep apnea. - she is compliant with CPAP Erica  reports benefit from therapy - uses [employer] for her DME - Continue CPAP 11 cm H2O - she can look up mask options Erica mask strap options online  Squamous cell carcinoma of esophagus. - Stage 1 - previously seen by Dr. Burr Medico with oncology - followed by GI at [facility]  CAD s/p stent. - followed by [facility]  Time Spent Involved in Patient Braun on Day of Examination:  32 minutes  Follow up:   Patient Instructions  Chest xray today  You can look up CPAP mask strap cushions or different mask options at CPAP.com or similar website  Call if you need help getting follow up appointment with Cardiology  Follow up in 1 year  Medication List:   Allergies as of 11/19/2020   No Known Allergies      Medication List        Accurate as of November 19, 2020 10:55 AM. If you have any questions, ask your nurse or doctor.          STOP taking these medications    isosorbide mononitrate 30 MG 24 hr tablet Commonly known as: Imdur Stopped by: Chesley Mires, MD       TAKE these medications    albuterol (2.5 MG/3ML) 0.083% nebulizer solution Commonly known as: PROVENTIL Take 3 mLs (2.5 mg total) by nebulization every 6 (six) hours as needed for wheezing or shortness of breath.   Anoro Ellipta 62.5-25 MCG/ACT Aepb Generic drug: umeclidinium-vilanterol Inhale 1 puff by mouth once daily   aspirin EC 81 MG tablet Take 81 mg by mouth daily.   atorvastatin 40 MG tablet Commonly known as: LIPITOR Take 1 tablet (40 mg total) by mouth daily.   Blood Glucose Monitor Software Devi Test BS BID Erica PRN Dx E11.9   famotidine 40 MG tablet Commonly known as: PEPCID Take 1 tablet by mouth at bedtime.   glimepiride 2 MG tablet Commonly known as: AMARYL Take 1 tablet by mouth daily as needed.   glucose blood test strip test BS BID Dx E11.9   losartan 25 MG tablet Commonly known as: COZAAR Take 1 tablet (25 mg total) by mouth daily.   metFORMIN 1000 MG tablet Commonly known  as: GLUCOPHAGE Take 1 tablet (1,000 mg total) by mouth 2 (two) times daily with a meal.   MULTIVITAMIN PO Take 1 tablet by mouth daily.   nitroGLYCERIN 0.4 MG SL tablet Commonly known as: Nitrostat Place 1 tablet (0.4 mg total) under the tongue every 5 (five) minutes as needed for chest pain.   omeprazole 40 MG capsule Commonly known as: PRILOSEC Take 1 capsule (40 mg total) by mouth 2 (two) times daily before a meal. (NEEDS TO BE SEEN BEFORE NEXT REFILL)   onetouch ultrasoft lancets Test BS BID Dx E11.9   Vitamin D3 50 MCG (2000 UT) Tabs Take 4,000 Units by mouth daily.        Signature:  Chesley Mires, MD [place] Pager - [phone] -  [phone] 11/19/2020, 10:55 AM

## 2020-11-22 IMAGING — PT NUCLEAR MEDICINE PET IMAGE INITIAL (PI) SKULL BASE TO THIGH
3 series · 23 of 25 positions shown · non-contrast
Comparison: Chest abdomen and pelvic CTs of 04/23/2018

CLINICAL DATA: Initial treatment strategy for new diagnosis of
squamous cell carcinoma of the esophagus.

EXAM:
NUCLEAR MEDICINE PET SKULL BASE TO THIGH
TECHNIQUE: 8.8 mCi F-18 FDG was injected intravenously. Full-ring PET imaging
was performed from the skull base to thigh after the radiotracer. CT
data was obtained and used for attenuation correction and anatomic
localization.
Fasting blood glucose: 68 mg/dl

[mip · 4 of 48 slices shown]
[im 1/48]
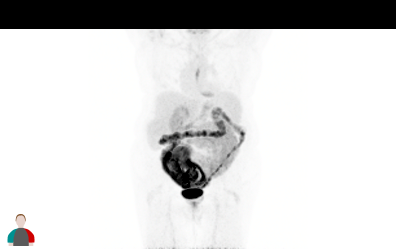
[im 16/48]
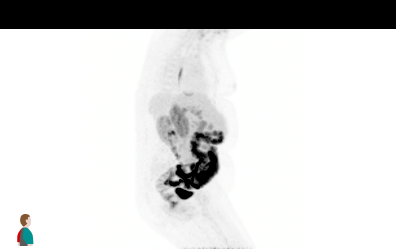
[im 32/48]
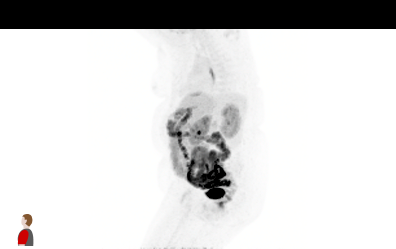
[im 48/48]
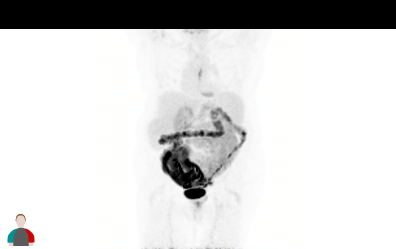

[axial ct wb fusion · 13 of 158 slices shown]
[im 1/158]
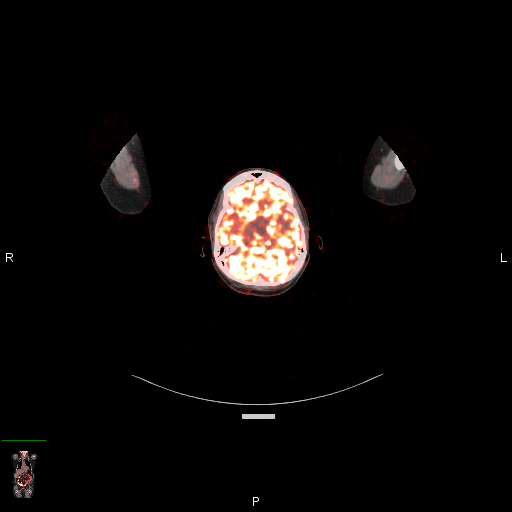
[im 12/158]
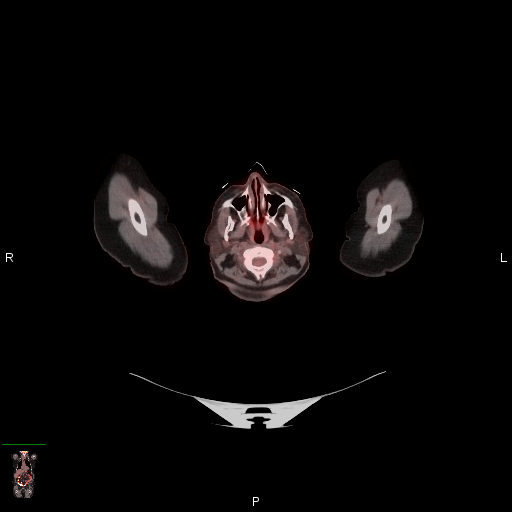
[im 34/158]
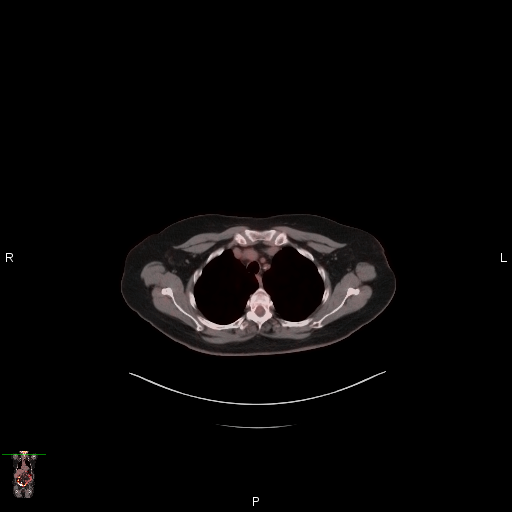
[im 45/158]
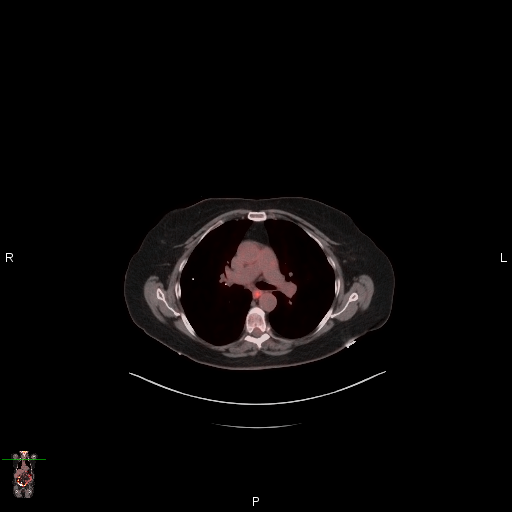
[im 57/158]
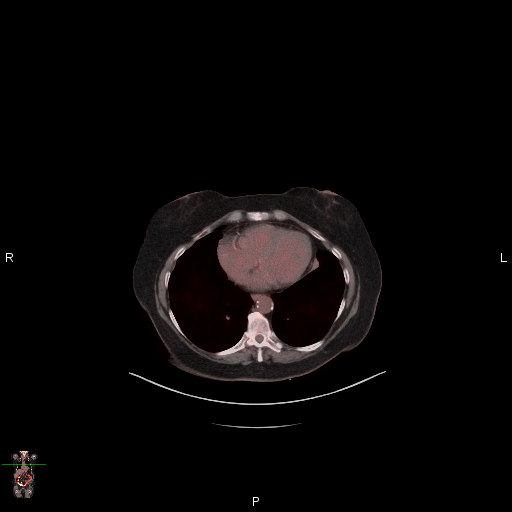
[im 68/158]
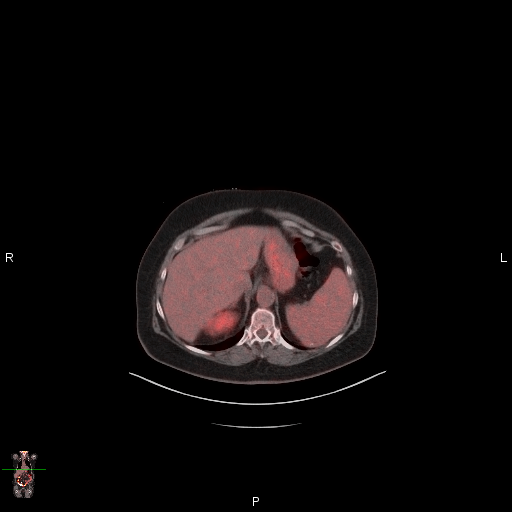
[im 79/158]
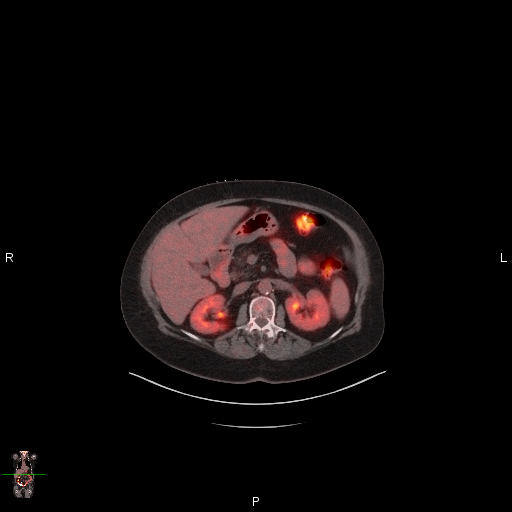
[im 90/158]
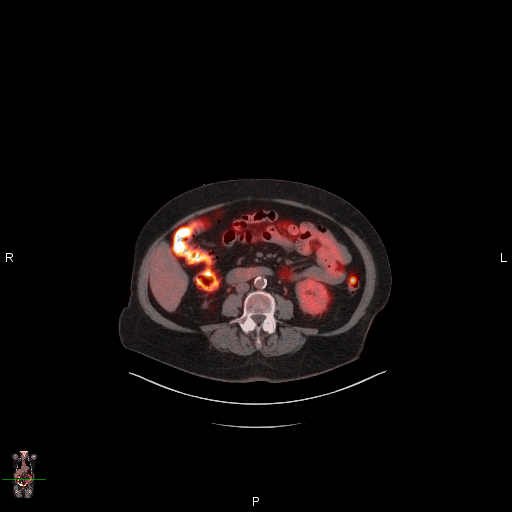
[im 101/158]
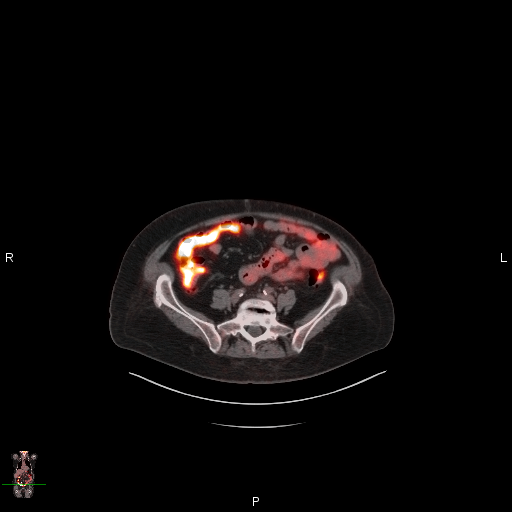
[im 113/158]
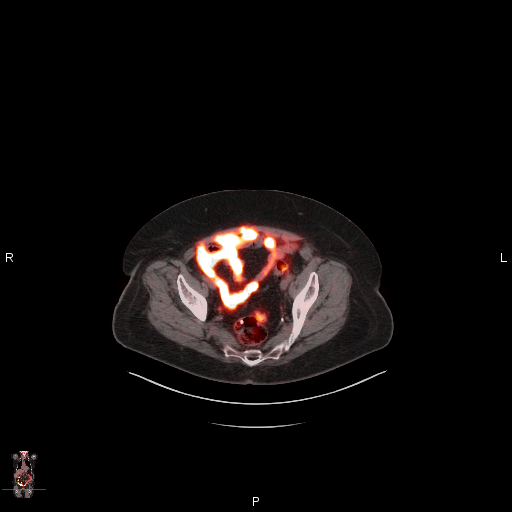
[im 124/158]
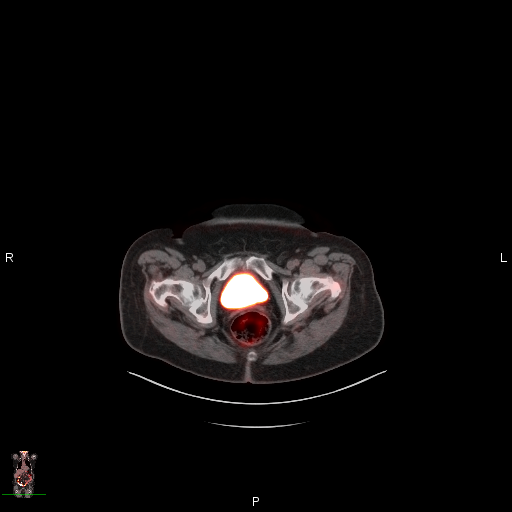
[im 135/158]
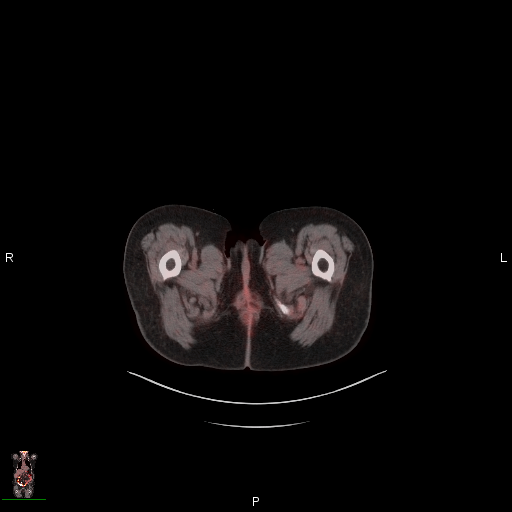
[im 146/158]
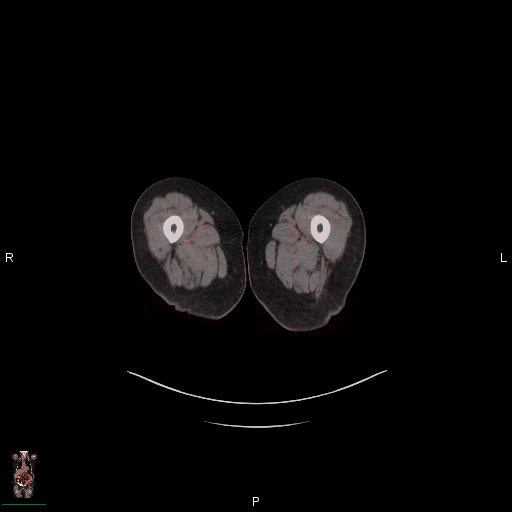

[coronal ct wb fusion · 6 of 61 slices shown]
[im 1/61]
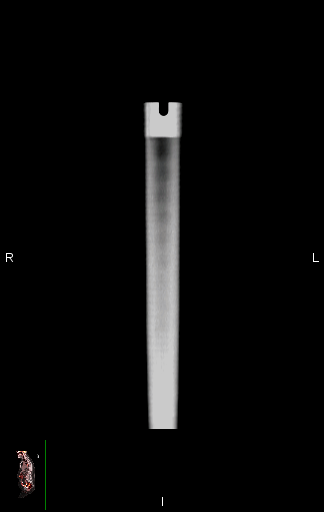
[im 13/61]
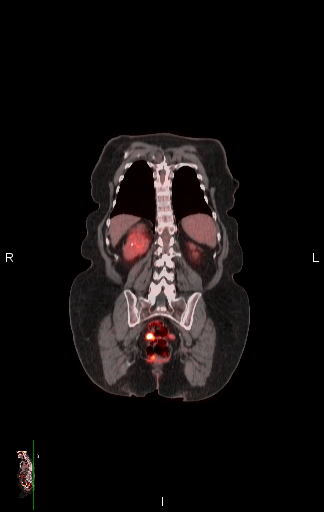
[im 25/61]
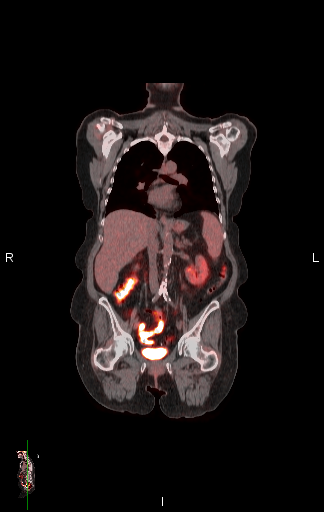
[im 37/61]
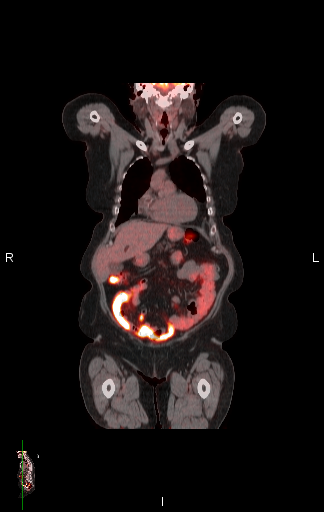
[im 49/61]
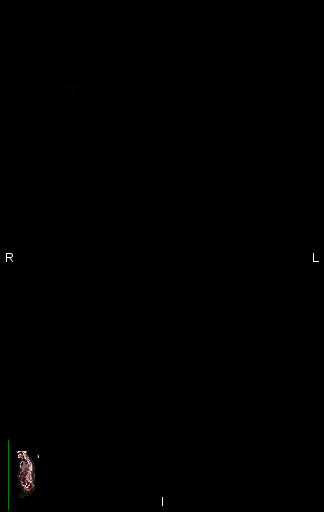
[im 61/61]
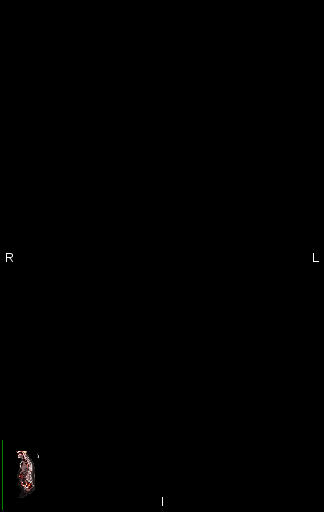

[23 of 25 positions shown; findings below may reference images not displayed]

FINDINGS: Mediastinal blood pool activity: SUV max

NECK: No areas of abnormal hypermetabolism.

Incidental CT findings: No cervical adenopathy. Left carotid
atherosclerosis.

CHEST: Mid esophageal hypermetabolism presumably represents the
primary lesion. This measures a S.U.V. max of 7.2, including on
image 94/3. No well-defined mass in this area.

No pulmonary parenchymal or thoracic nodal hypermetabolism.

Incidental CT findings: Aortic and coronary artery atherosclerosis.
Left-sided thyroid atrophy versus surgical absence.

Mild centrilobular emphysema.  Calcified right upper lobe granuloma.

ABDOMEN/PELVIS: No abdominopelvic parenchymal or nodal
hypermetabolism. Diffuse colonic hypermetabolism is likely
physiologic.

Incidental CT findings: Normal adrenal glands. 4 mm interpolar right
renal collecting system calculus. Abdominal aortic atherosclerosis.
Old granulomatous disease in the spleen. Scattered colonic
diverticula. Hysterectomy.

SKELETON: No abnormal marrow activity.

Incidental CT findings: none
IMPRESSION: 1. Mid esophageal hypermetabolism, presumably representing the
primary lesion.
2. No findings of hypermetabolic metastatic disease.
3. Aortic atherosclerosis (MREMY-RGM.M), coronary artery
atherosclerosis and emphysema (MREMY-PUP.U).
4. Right nephrolithiasis

## 2020-12-05 ENCOUNTER — Ambulatory Visit (INDEPENDENT_AMBULATORY_CARE_PROVIDER_SITE_OTHER): Payer: Medicare HMO

## 2020-12-05 VITALS — Ht 62.0 in | Wt 145.0 lb

## 2020-12-05 DIAGNOSIS — I129 Hypertensive chronic kidney disease with stage 1 through stage 4 chronic kidney disease, or unspecified chronic kidney disease: Secondary | ICD-10-CM | POA: Diagnosis not present

## 2020-12-05 DIAGNOSIS — E119 Type 2 diabetes mellitus without complications: Secondary | ICD-10-CM

## 2020-12-05 DIAGNOSIS — E1122 Type 2 diabetes mellitus with diabetic chronic kidney disease: Secondary | ICD-10-CM

## 2020-12-05 DIAGNOSIS — Z0000 Encounter for general adult medical examination without abnormal findings: Secondary | ICD-10-CM

## 2020-12-05 DIAGNOSIS — Z0001 Encounter for general adult medical examination with abnormal findings: Secondary | ICD-10-CM

## 2020-12-05 NOTE — Progress Notes (Signed)
Subjective:   Erica Braun is a 70 y.o. female who presents for Medicare Annual (Subsequent) preventive examination. Virtual Visit via Telephone Note  I connected with  Kenn File Sessa on 12/05/20 at  3:30 PM EST by telephone and verified that I am speaking with the correct person using two identifiers.  Location: Patient: HOME Provider: [facility] Persons participating in the virtual visit: patient/Nurse Health Advisor   I discussed the limitations, risks, security and privacy concerns of performing an evaluation and management service by telephone and the availability of in person appointments. The patient expressed understanding and agreed to proceed.  Interactive audio and video telecommunications were attempted between this nurse and patient, however failed, due to patient having technical difficulties OR patient did not have access to video capability.  We continued and completed visit with audio only.  Some vital signs may be absent or patient reported.   Chriss Driver, LPN  Review of Systems     Cardiac Risk Factors include: advanced age (>57men, >71 women);hypertension;diabetes mellitus;sedentary lifestyle     Objective:    Today's Vitals   12/05/20 1522  Weight: 145 lb (65.8 kg)  Height: 5\' 2"  (1.575 m)   Body mass index is 26.52 kg/m.  Advanced Directives 12/05/2020 05/11/2018 05/03/2018 04/08/2018 06/11/2017  Does Patient Have a Medical Advance Directive? No No No No No  Would patient like information on creating a medical advance directive? No - Patient declined No - Patient declined No - Patient declined No - Guardian declined Yes (MAU/Ambulatory/Procedural Areas - Information given)    Current Medications (verified) Outpatient Encounter Medications as of 12/05/2020  Medication Sig   albuterol (PROVENTIL) (2.5 MG/3ML) 0.083% nebulizer solution Take 3 mLs (2.5 mg total) by nebulization every 6 (six) hours as needed for wheezing or shortness of  breath.   aspirin EC 81 MG tablet Take 81 mg by mouth daily.   atorvastatin (LIPITOR) 40 MG tablet Take 1 tablet (40 mg total) by mouth daily.   Blood Glucose Monitor Software DEVI Test BS BID and PRN Dx E11.9   Cholecalciferol (VITAMIN D3) 2000 units TABS Take 4,000 Units by mouth daily.    famotidine (PEPCID) 40 MG tablet Take 1 tablet by mouth at bedtime.   glimepiride (AMARYL) 2 MG tablet Take 1 tablet by mouth daily as needed.   glucose blood test strip test BS BID Dx E11.9   Lancets (ONETOUCH ULTRASOFT) lancets Test BS BID Dx E11.9   losartan (COZAAR) 25 MG tablet Take 1 tablet (25 mg total) by mouth daily.   metFORMIN (GLUCOPHAGE) 1000 MG tablet Take 1 tablet (1,000 mg total) by mouth 2 (two) times daily with a meal.   Multiple Vitamins-Minerals (MULTIVITAMIN PO) Take 1 tablet by mouth daily.   omeprazole (PRILOSEC) 40 MG capsule Take 1 capsule (40 mg total) by mouth 2 (two) times daily before a meal. (NEEDS TO BE SEEN BEFORE NEXT REFILL)   umeclidinium-vilanterol (ANORO ELLIPTA) 62.5-25 MCG/INH AEPB Inhale 1 puff by mouth once daily   nitroGLYCERIN (NITROSTAT) 0.4 MG SL tablet Place 1 tablet (0.4 mg total) under the tongue every 5 (five) minutes as needed for chest pain.   No facility-administered encounter medications on file as of 12/05/2020.    Allergies (verified) Patient has no known allergies.   History: Past Medical History:  Diagnosis Date   Complication of anesthesia    COPD (chronic obstructive pulmonary disease) ([place])    Coronary artery disease    Diabetes mellitus without complication ([place])  Diverticulosis    Early cataracts, bilateral 11/21/2016   Emphysema of lung ([facility])    GERD (gastroesophageal reflux disease)    Glaucoma    Hyperlipidemia    Hypertension    Internal hemorrhoids    Pneumonia    PONV (postoperative nausea and vomiting)    Sleep apnea    uses Cpap   Spasm of the cricopharyngeus muscle    Squamous cell esophageal cancer ([place]) 05/10/2018    Past Surgical History:  Procedure Laterality Date   ABDOMINAL HYSTERECTOMY     cervical dysplasia   BIOPSY  05/03/2018   Procedure: BIOPSY;  Surgeon: Irving Copas., MD;  Location: [facility] ENDOSCOPY;  Service: Gastroenterology;;   BREAST BIOPSY Left    BREAST LUMPECTOMY Left    ESOPHAGOGASTRODUODENOSCOPY (EGD) WITH PROPOFOL N/A 05/03/2018   Procedure: ESOPHAGOGASTRODUODENOSCOPY (EGD) WITH PROPOFOL;  Surgeon: Irving Copas., MD;  Location: [facility];  Service: Gastroenterology;  Laterality: N/A;   ESOPHAGOGASTRODUODENOSCOPY (EGD) WITH PROPOFOL N/A 11/18/2018   Procedure: ESOPHAGOGASTRODUODENOSCOPY (EGD) WITH PROPOFOL;  Surgeon: Irene Shipper, MD;  Location[facility] ENDOSCOPY;  Service: Endoscopy;  Laterality: N/A;   EUS  05/03/2018   Procedure: UPPER ENDOSCOPIC ULTRASOUND (EUS) RADIAL;  Surgeon: Irving Copas., MD;  Location[facility] ENDOSCOPY;  Service: Gastroenterology;;   FOREIGN BODY REMOVAL  11/18/2018   Procedure: FOREIGN BODY REMOVAL;  Surgeon: Irene Shipper, MD;  Location[facility] ENDOSCOPY;  Service: Endoscopy;;   LEFT HEART CATH AND CORONARY ANGIOGRAPHY N/A 06/11/2017   Procedure: LEFT HEART CATH AND CORONARY ANGIOGRAPHY;  Surgeon: Belva Crome, MD;  Location: [place] CV LAB;  Service: Cardiovascular;  Laterality: N/A;   Family History  Problem Relation Age of Onset   Cancer Mother        breast   Arthritis Mother    COPD Father 43       emphysema   Alcohol abuse Father    Diabetes Father    Cancer Sister        breast   Early death Brother        overdose   Drug abuse Brother    Alcohol abuse Paternal Aunt    COPD Paternal Aunt    Cancer Maternal Grandmother        liver cancer   Diabetes Maternal Grandmother    Alcohol abuse Maternal Grandfather    Early death Maternal Grandfather    Stroke Paternal Grandmother    Heart disease Paternal Grandfather    Esophageal cancer Neg Hx    Stomach cancer Neg Hx    Social History   Socioeconomic History    Marital status: Married    Spouse name: [name]   Number of children: 3   Years of education: 61   Highest education level: Some college, no degree  Occupational History   Occupation: retired    Comment: Advertising account planner  Tobacco Use   Smoking status: Former    Packs/day: 1.50    Years: 41.00    Pack years: 61.50    Types: Cigarettes    Start date: 1969    Quit date: 2010    Years since quitting: 12.9   Smokeless tobacco: Never  Vaping Use   Vaping Use: Never used  Substance and Sexual Activity   Alcohol use: No   Drug use: No   Sexual activity: Yes    Birth control/protection: Post-menopausal  Other Topics Concern   Not on file  Social History Narrative   Recent move from [place]  to walk   Married to [employer] dog at home   Social Determinants of Radio broadcast assistant Strain: Low Risk    Difficulty of Paying Living Expenses: Not hard at all  Food Insecurity: No Food Insecurity   Worried About Charity fundraiser in the Last Year: Never true   Arboriculturist in the Last Year: Never true  Transportation Needs: No Transportation Needs   Lack of Transportation (Medical): No   Lack of Transportation (Non-Medical): No  Physical Activity: Insufficiently Active   Days of Exercise per Week: 3 days   Minutes of Exercise per Session: 30 min  Stress: No Stress Concern Present   Feeling of Stress : Not at all  Social Connections: Socially Integrated   Frequency of Communication with Friends and Family: More than three times a week   Frequency of Social Gatherings with Friends and Family: More than three times a week   Attends Religious Services: More than 4 times per year   Active Member of [employer] or Organizations: Yes   Attends Music therapist: More than 4 times per year   Marital Status: Married    Tobacco Counseling Counseling given: Not Answered   Clinical Intake:  Pre-visit preparation completed: Yes  Pain : No/denies pain     BMI -  recorded: 26.52 Nutritional Status: BMI 25 -29 Overweight Nutritional Risks: None Diabetes: Yes  How often do you need to have someone help you when you read instructions, pamphlets, or other written materials from your doctor or pharmacy?: 1 - Never  Diabetic?Nutrition Risk Assessment:  Has the patient had any N/V/D within the last 2 months?  Yes  Does the patient have any non-healing wounds?  No  Has the patient had any unintentional weight loss or weight gain?  No   Diabetes:  Is the patient diabetic?  Yes  If diabetic, was a CBG obtained today?  No  Did the patient bring in their glucometer from home?  No  How often do you monitor your CBG's? Daily.   Financial Strains and Diabetes Management:  Are you having any financial strains with the device, your supplies or your medication? No .  Does the patient want to be seen by Chronic Care Management for management of their diabetes?  No  Would the patient like to be referred to a Nutritionist or for Diabetic Management?  No   Diabetic Exams:  Diabetic Eye Exam: Completed 07/19/2019. Overdue for diabetic eye exam. Pt has been advised about the importance in completing this exam. A referral has been placed today. Message sent to referral coordinator for scheduling purposes. Advised pt to expect a call from office referred to regarding appt.  Diabetic Foot Exam: Completed 10/25/2020. Pt has been advised about the importance in completing this exam.   Interpreter Needed?: No  Information entered by :: MJ Londa Mackowski, LPN   Activities of Daily Living In your present state of health, do you have any difficulty performing the following activities: 12/05/2020  Hearing? N  Vision? N  Difficulty concentrating or making decisions? Y  Comment Memory  Walking or climbing stairs? N  Dressing or bathing? N  Doing errands, shopping? N  Preparing Food and eating ? N  Using the Toilet? N  In the past six months, have you accidently leaked  urine? N  Do you have problems with loss of bowel control? N  Managing your Medications? N  Managing your Finances? N  Housekeeping or managing your Housekeeping? N  Some recent data might be hidden    Patient Care Team: Sharion Balloon, [name] as PCP - General (Family Medicine) Herminio Commons, MD (Inactive) as PCP - Cardiology (Cardiology)  Indicate any recent Medical Services you may have received from other than [facility] providers in the past year (date may be approximate).     Assessment:   This is a routine wellness examination for Pooja.  Hearing/Vision screen Hearing Screening - Comments:: No hearing issues.  Vision Screening - Comments:: Glasses. My [place]. 07/19/2019.  Dietary issues and exercise activities discussed: Current Exercise Habits: Home exercise routine, Type of exercise: walking, Time (Minutes): 20, Frequency (Times/Week): 3, Weekly Exercise (Minutes/Week): 60, Intensity: Mild, Exercise limited by: cardiac condition(s)   Goals Addressed             This Visit's Progress    Exercise 3x per week (30 min per time)       Increase walking.        Depression Screen PHQ 2/9 Scores 12/05/2020 09/01/2019 03/21/2019 03/18/2019 03/01/2019 03/23/2018 12/17/2017  PHQ - 2 Score 0 0 0 0 0 0 0    Fall Risk Fall Risk  12/05/2020 09/01/2019 03/21/2019 03/18/2019 03/01/2019  Falls in the past year? 0 0 0 0 0  Number falls in past yr: 0 - - 0 -  Injury with Fall? 0 - - 0 -  Risk for fall due to : No Fall Risks - - No Fall Risks -  Follow up Falls prevention discussed - - Falls evaluation completed -    FALL RISK PREVENTION PERTAINING TO THE HOME:  Any stairs in or around the home? Yes  If so, are there any without handrails? No  Home free of loose throw rugs in walkways, pet beds, electrical cords, etc? Yes  Adequate lighting in your home to reduce risk of falls? Yes   ASSISTIVE DEVICES UTILIZED TO PREVENT FALLS:  Life alert? No  Use of a cane, walker or  w/c? No  Grab bars in the bathroom? No  Shower chair or bench in shower? Yes  Elevated toilet seat or a handicapped toilet? Yes   TIMED UP AND GO:  Was the test performed? No .  Phone visit.  Cognitive Function:     6CIT Screen 12/05/2020 03/18/2019  What Year? 0 points 0 points  What month? 0 points 0 points  What time? 0 points 0 points  Count back from 20 0 points 0 points  Months in reverse 0 points 0 points  Repeat phrase 0 points 0 points  Total Score 0 0    Immunizations Immunization History  Administered Date(s) Administered   Fluad Quad(high Dose 65+) 10/20/2019, 10/25/2020   Influenza, High Dose Seasonal PF 11/01/2017, 10/07/2018, 11/08/2018, 11/08/2018   Influenza,inj,Quad PF,6+ Mos 11/21/2016   Moderna Sars-Covid-2 Vaccination 04/07/2019, 05/10/2019   Pneumococcal Conjugate-13 09/01/2019   Pneumococcal Polysaccharide-23 12/11/2017    TDAP status: Due, Education has been provided regarding the importance of this vaccine. Advised may receive this vaccine at local pharmacy or Health Dept. Aware to provide a copy of the vaccination record if obtained from local pharmacy or Health Dept. Verbalized acceptance and understanding.  Flu Vaccine status: Up to date  Pneumococcal vaccine status: Up to date  Covid-19 vaccine status: Information provided on how to obtain vaccines.   Qualifies for Shingles Vaccine? Yes   Zostavax completed No   Shingrix Completed?: No.    Education has been provided  regarding the importance of this vaccine. Patient has been advised to call insurance company to determine out of pocket expense if they have not yet received this vaccine. Advised may also receive vaccine at local pharmacy or Health Dept. Verbalized acceptance and understanding.  Screening Tests Health Maintenance  Topic Date Due   COVID-19 Vaccine (3 - Moderna risk series) 06/07/2019   OPHTHALMOLOGY EXAM  07/18/2020   Zoster Vaccines- Shingrix (1 of 2) 01/25/2021 (Originally  06/18/1969)   TETANUS/TDAP  10/25/2021 (Originally 06/18/1969)   HEMOGLOBIN A1C  04/25/2021   FOOT EXAM  10/25/2021   MAMMOGRAM  10/25/2022   COLONOSCOPY (Pts 45-42yrs Insurance coverage will need to be confirmed)  03/21/2025   Pneumonia Vaccine 10+ Years old  Completed   INFLUENZA VACCINE  Completed   DEXA SCAN  Completed   Hepatitis C Screening  Completed   HPV VACCINES  Aged Out    Health Maintenance  Health Maintenance Due  Topic Date Due   COVID-19 Vaccine (3 - Moderna risk series) 06/07/2019   OPHTHALMOLOGY EXAM  07/18/2020    Colorectal cancer screening: Type of screening: Colonoscopy. Completed 03/22/2015. Repeat every 10 years  Mammogram status: Completed 10/24/2020. Repeat every year  Bone Density status: Completed 07/23/2015. Results reflect: Bone density results: OSTEOPENIA. Repeat every 2 years.  Lung Cancer Screening: (Low Dose CT Chest recommended if Age 52-80 years, 30 pack-year currently smoking OR have quit w/in 15years.) does not qualify.  NON SMOKER  Additional Screening:  Hepatitis C Screening: does qualify; Completed 09/21/2015  Vision Screening: Recommended annual ophthalmology exams for early detection of glaucoma and other disorders of the eye. Is the patient up to date with their annual eye exam?  Yes  Who is the provider or what is the name of the office in which the patient attends annual eye exams? [facility] If pt is not established with a provider, would they like to be referred to a provider to establish care? Yes .   Dental Screening: Recommended annual dental exams for proper oral hygiene  Community Resource Referral / Chronic Care Management: CRR required this visit?  No   CCM required this visit?  No      Plan:     I have personally reviewed and noted the following in the patient's chart:   Medical and social history Use of alcohol, tobacco or illicit drugs  Current medications and supplements including opioid prescriptions.   Functional ability and status Nutritional status Physical activity Advanced directives List of other physicians Hospitalizations, surgeries, and ER visits in previous 12 months Vitals Screenings to include cognitive, depression, and falls Referrals and appointments  In addition, I have reviewed and discussed with patient certain preventive protocols, quality metrics, and best practice recommendations. A written personalized care plan for preventive services as well as general preventive health recommendations were provided to patient.     Chriss Driver, LPN   [id number]   Nurse Notes: Phone visit. Pt at Home. 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 Up to date on all health maintenance. Pt states she had a bone density in 2021. Asked patient to sign records release, so copy can be scanned into her chart, from [facility]. Pt verbalized understanding. Pt requests referral to [facility] for eye exam.

## 2020-12-05 NOTE — Patient Instructions (Signed)
Erica Braun , Thank you for taking time to come for your Medicare Wellness Visit. I appreciate your ongoing commitment to your health goals. Please review the following plan we discussed and let me know if I can assist you in the future.   Screening recommendations/referrals: Colonoscopy: Done 03/22/2015 Repeat in 10 years  Mammogram: Done 10/24/2020 Repeat annually  Bone Density: Done 07/23/2015 Osteopenia. At your convenience, please sign a records release to have current report sent to the office.   Recommended yearly ophthalmology/optometry visit for glaucoma screening and checkup Recommended yearly dental visit for hygiene and checkup  Vaccinations: Influenza vaccine: Done 10/25/2020  Pneumococcal vaccine: Done 01/31/2017 and 09/01/2019 Tdap vaccine: Due Repeat in 10 years  Shingles vaccine: Shingrix discussed. Please contact your pharmacy for coverage information.     Covid-19:Done 04/07/2019 and 05/10/2019  Advanced directives: Advance directive discussed with you today. Even though you declined this today, please call our office should you change your mind, and we can give you the proper paperwork for you to fill out.   Conditions/risks identified: Aim for 30 minutes of exercise or brisk walking each day, drink 6-8 glasses of water and eat lots of fruits and vegetables.   Next appointment: Follow up in one year for your annual wellness visit 2023.   Preventive Care 16 Years and Older, Female Preventive care refers to lifestyle choices and visits with your health care provider that can promote health and wellness. What does preventive care include? A yearly physical exam. This is also called an annual well check. Dental exams once or twice a year. Routine eye exams. Ask your health care provider how often you should have your eyes checked. Personal lifestyle choices, including: Daily care of your teeth and gums. Regular physical activity. Eating a healthy diet. Avoiding  tobacco and drug use. Limiting alcohol use. Practicing safe sex. Taking low-dose aspirin every day. Taking vitamin and mineral supplements as recommended by your health care provider. What happens during an annual well check? The services and screenings done by your health care provider during your annual well check will depend on your age, overall health, lifestyle risk factors, and family history of disease. Counseling  Your health care provider may ask you questions about your: Alcohol use. Tobacco use. Drug use. Emotional well-being. Home and relationship well-being. Sexual activity. Eating habits. History of falls. Memory and ability to understand (cognition). Work and work Statistician. Reproductive health. Screening  You may have the following tests or measurements: Height, weight, and BMI. Blood pressure. Lipid and cholesterol levels. These may be checked every 5 years, or more frequently if you are over 73 years old. Skin check. Lung cancer screening. You may have this screening every year starting at age 21 if you have a 30-pack-year history of smoking and currently smoke or have quit within the past 15 years. Fecal occult blood test (FOBT) of the stool. You may have this test every year starting at age 61. Flexible sigmoidoscopy or colonoscopy. You may have a sigmoidoscopy every 5 years or a colonoscopy every 10 years starting at age 65. Hepatitis C blood test. Hepatitis B blood test. Sexually transmitted disease (STD) testing. Diabetes screening. This is done by checking your blood sugar (glucose) after you have not eaten for a while (fasting). You may have this done every 1-3 years. Bone density scan. This is done to screen for osteoporosis. You may have this done starting at age 28. Mammogram. This may be done every 1-2 years. Talk to your health  care provider about how often you should have regular mammograms. Talk with your health care provider about your test  results, treatment options, and if necessary, the need for more tests. Vaccines  Your health care provider may recommend certain vaccines, such as: Influenza vaccine. This is recommended every year. Tetanus, diphtheria, and acellular pertussis (Tdap, Td) vaccine. You may need a Td booster every 10 years. Zoster vaccine. You may need this after age 19. Pneumococcal 13-valent conjugate (PCV13) vaccine. One dose is recommended after age 88. Pneumococcal polysaccharide (PPSV23) vaccine. One dose is recommended after age 21. Talk to your health care provider about which screenings and vaccines you need and how often you need them. This information is not intended to replace advice given to you by your health care provider. Make sure you discuss any questions you have with your health care provider. Document Released: 01/19/2015 Document Revised: 09/12/2015 Document Reviewed: 10/24/2014 Elsevier Interactive Patient Education  2017 [place] Prevention in the Home Falls can cause injuries. They can happen to people of all ages. There are many things you can do to make your home safe and to help prevent falls. What can I do on the outside of my home? Regularly fix the edges of walkways and driveways and fix any cracks. Remove anything that might make you trip as you walk through a door, such as a raised step or threshold. Trim any bushes or trees on the path to your home. Use bright outdoor lighting. Clear any walking paths of anything that might make someone trip, such as rocks or tools. Regularly check to see if handrails are loose or broken. Make sure that both sides of any steps have handrails. Any raised decks and porches should have guardrails on the edges. Have any leaves, snow, or ice cleared regularly. Use sand or salt on walking paths during winter. Clean up any spills in your garage right away. This includes oil or grease spills. What can I do in the bathroom? Use night  lights. Install grab bars by the toilet and in the tub and shower. Do not use towel bars as grab bars. Use non-skid mats or decals in the tub or shower. If you need to sit down in the shower, use a plastic, non-slip stool. Keep the floor dry. Clean up any water that spills on the floor as soon as it happens. Remove soap buildup in the tub or shower regularly. Attach bath mats securely with double-sided non-slip rug tape. Do not have throw rugs and other things on the floor that can make you trip. What can I do in the bedroom? Use night lights. Make sure that you have a light by your bed that is easy to reach. Do not use any sheets or blankets that are too big for your bed. They should not hang down onto the floor. Have a firm chair that has side arms. You can use this for support while you get dressed. Do not have throw rugs and other things on the floor that can make you trip. What can I do in the kitchen? Clean up any spills right away. Avoid walking on wet floors. Keep items that you use a lot in easy-to-reach places. If you need to reach something above you, use a strong step stool that has a grab bar. Keep electrical cords out of the way. Do not use floor polish or wax that makes floors slippery. If you must use wax, use non-skid floor wax. Do not have throw  rugs and other things on the floor that can make you trip. What can I do with my stairs? Do not leave any items on the stairs. Make sure that there are handrails on both sides of the stairs and use them. Fix handrails that are broken or loose. Make sure that handrails are as long as the stairways. Check any carpeting to make sure that it is firmly attached to the stairs. Fix any carpet that is loose or worn. Avoid having throw rugs at the top or bottom of the stairs. If you do have throw rugs, attach them to the floor with carpet tape. Make sure that you have a light switch at the top of the stairs and the bottom of the stairs. If  you do not have them, ask someone to add them for you. What else can I do to help prevent falls? Wear shoes that: Do not have high heels. Have rubber bottoms. Are comfortable and fit you well. Are closed at the toe. Do not wear sandals. If you use a stepladder: Make sure that it is fully opened. Do not climb a closed stepladder. Make sure that both sides of the stepladder are locked into place. Ask someone to hold it for you, if possible. Clearly mark and make sure that you can see: Any grab bars or handrails. First and last steps. Where the edge of each step is. Use tools that help you move around (mobility aids) if they are needed. These include: Canes. Walkers. Scooters. Crutches. Turn on the lights when you go into a dark area. Replace any light bulbs as soon as they burn out. Set up your furniture so you have a clear path. Avoid moving your furniture around. If any of your floors are uneven, fix them. If there are any pets around you, be aware of where they are. Review your medicines with your doctor. Some medicines can make you feel dizzy. This can increase your chance of falling. Ask your doctor what other things that you can do to help prevent falls. This information is not intended to replace advice given to you by your health care provider. Make sure you discuss any questions you have with your health care provider. Document Released: 10/19/2008 Document Revised: 05/31/2015 Document Reviewed: 01/27/2014 Elsevier Interactive Patient Education  2017 [employer].

## 2021-01-16 DIAGNOSIS — Z7982 Long term (current) use of aspirin: Secondary | ICD-10-CM | POA: Diagnosis not present

## 2021-01-16 DIAGNOSIS — Z85828 Personal history of other malignant neoplasm of skin: Secondary | ICD-10-CM | POA: Diagnosis not present

## 2021-01-16 DIAGNOSIS — I10 Essential (primary) hypertension: Secondary | ICD-10-CM | POA: Diagnosis not present

## 2021-01-16 DIAGNOSIS — K222 Esophageal obstruction: Secondary | ICD-10-CM | POA: Diagnosis not present

## 2021-01-16 DIAGNOSIS — J439 Emphysema, unspecified: Secondary | ICD-10-CM | POA: Diagnosis not present

## 2021-01-16 DIAGNOSIS — Z8501 Personal history of malignant neoplasm of esophagus: Secondary | ICD-10-CM | POA: Diagnosis not present

## 2021-01-16 DIAGNOSIS — K219 Gastro-esophageal reflux disease without esophagitis: Secondary | ICD-10-CM | POA: Diagnosis not present

## 2021-01-16 DIAGNOSIS — R1310 Dysphagia, unspecified: Secondary | ICD-10-CM | POA: Diagnosis not present

## 2021-01-16 DIAGNOSIS — G4730 Sleep apnea, unspecified: Secondary | ICD-10-CM | POA: Diagnosis not present

## 2021-01-16 DIAGNOSIS — E119 Type 2 diabetes mellitus without complications: Secondary | ICD-10-CM | POA: Diagnosis not present

## 2021-01-16 DIAGNOSIS — K449 Diaphragmatic hernia without obstruction or gangrene: Secondary | ICD-10-CM | POA: Diagnosis not present

## 2021-02-21 ENCOUNTER — Other Ambulatory Visit: Payer: Self-pay | Admitting: Family

## 2021-05-15 ENCOUNTER — Telehealth: Payer: Self-pay | Admitting: Internal Medicine

## 2021-05-15 NOTE — Telephone Encounter (Signed)
Hi Dr. Hilarie Fredrickson, ? ?Patient called requesting a transfer of care back to you. She is currently with [facility] said her husband suffered from a stroke and he is no longer able to help her with transportation long distance and [facility] would be most convenient for her to continue GI care. ? ?Records are available in Epic for review and advise on scheduling. ? ?Thanks ?

## 2021-05-16 NOTE — Telephone Encounter (Signed)
[place] for followup with me ?

## 2021-05-16 NOTE — Telephone Encounter (Signed)
Patient states that she wants to change back to Dr Vena Rua care due to transportation. She feels she is stable at this point, although she also endorses dysphagia to breads, crackers etc. She states that she was told she would need dilation every 6 weeks, but feels this is too often in comparison to her swallowing symptoms. She says she did have an esophageal stent placed at one time but this has since been removed as it migrated and had to be emergently removed. ? ?

## 2021-05-16 NOTE — Telephone Encounter (Signed)
I have read familiarize myself with her case ? ?Erica Braun, please call and ask her if she is having specific symptoms.  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  She was seen by Dr. Leroy Sea at [facility] for ESD.  She developed esophageal stricture thereafter and has had stenting and then subsequent dilations last about 4 months ago. ?I certainly want to help her if we can but I need to know what her symptoms are to ensure that we had the necessary services to help. ? ?Thanks ?JMP ? ?

## 2021-05-16 NOTE — Telephone Encounter (Signed)
I have contacted patient to make her aware that Dr Hilarie Fredrickson indicates follow up with him is ok. She states that she will call back to schedule follow up since she is currently driving. ?

## 2021-06-18 DIAGNOSIS — G4733 Obstructive sleep apnea (adult) (pediatric): Secondary | ICD-10-CM | POA: Diagnosis not present

## 2021-07-05 NOTE — Progress Notes (Signed)
CARDIOLOGY CONSULT NOTE       Patient ID: Erica Braun MRN: [id number] DOB/AGE: 08/15/1950 71 y.o.  Admit date: (Not on file) Referring Physician: Craige Cotta Primary Physician: Junie Spencer, [name] Primary Cardiologist: New Reason for Consultation: Chest Pain/Dyspnea  Active Problems:   * No active hospital problems. *   HPI:  71 y.o. referred by Dr Craige Cotta for chest pain and dyspnea. Sees pulmonary for asthma/COPD Quit smoking 12 years ago Uses CPAP For OSA  Has history of stage one esophageal cancer followed at [facility] Last EGD 01/2021 with no cancer and dilatation performed She had some chest symptoms on diagnosis of this She is diabetic with HTN and HLD.    Previously seen by Dr Darl Householder Cath by Dr Katrinka Blazing 06/12/17 with CTO RCA with left to right collaterals and 60% D1 stenosis EF 55% Medical Rx recommended She was prescribed imdur but for some reason this was stopped on her last pulmonary visit Prior to cath had an abnormal myovue with moderate mixed defect in inferior wall EF 52%   She has been having exertional dyspnea but denies chest pain Dyspnea better after pollen season and when not in humidity like when she saw her brother in [place] who owns a vinyard  Lipitor giving her bad leg pains Discussed changing to crestor   Still with some dysphagia from esophageal cancer   ROS All other systems reviewed and negative except as noted above  Past Medical History:  Diagnosis Date   Complication of anesthesia    COPD (chronic obstructive pulmonary disease) (HCC)    Coronary artery disease    Diabetes mellitus without complication (HCC)    Diverticulosis    Early cataracts, bilateral 11/21/2016   Emphysema of lung (HCC)    GERD (gastroesophageal reflux disease)    Glaucoma    Hyperlipidemia    Hypertension    Internal hemorrhoids    Pneumonia    PONV (postoperative nausea and vomiting)    Sleep apnea    uses Cpap   Spasm of the cricopharyngeus muscle    Squamous cell  esophageal cancer (HCC) 05/10/2018    Family History  Problem Relation Age of Onset   Cancer Mother        breast   Arthritis Mother    COPD Father 58       emphysema   Alcohol abuse Father    Diabetes Father    Cancer Sister        breast   Early death Brother        overdose   Drug abuse Brother    Alcohol abuse Paternal Aunt    COPD Paternal Aunt    Cancer Maternal Grandmother        liver cancer   Diabetes Maternal Grandmother    Alcohol abuse Maternal Grandfather    Early death Maternal Grandfather    Stroke Paternal Grandmother    Heart disease Paternal Grandfather    Esophageal cancer Neg Hx    Stomach cancer Neg Hx     Social History   Socioeconomic History   Marital status: Married    Spouse name: [name]   Number of children: 3   Years of education: 14   Highest education level: Some college, no degree  Occupational History   Occupation: retired    Comment: Radiation protection practitioner  Tobacco Use   Smoking status: Former    Packs/day: 1.50    Years: 41.00    Total pack years: 61.50  Types: Cigarettes    Start date: 32    Quit date: 2010    Years since quitting: 13.5   Smokeless tobacco: Never  Vaping Use   Vaping Use: Never used  Substance and Sexual Activity   Alcohol use: No   Drug use: No   Sexual activity: Yes    Birth control/protection: Post-menopausal  Other Topics Concern   Not on file  Social History Narrative   Recent move from [place] to walk   Married to [name] dog at home   Social Determinants of [employer]: Low Risk  (12/05/2020)   Overall Financial Resource Strain (CARDIA)    Difficulty of Paying Living Expenses: Not hard at all  Food Insecurity: No Food Insecurity (12/05/2020)   Hunger Vital Sign    Worried About Running Out of Food in the Last Year: Never true    Ran Out of Food in the Last Year: Never true  Transportation Needs: No Transportation Needs (12/05/2020)   PRAPARE - Therapist, art (Medical): No    Lack of Transportation (Non-Medical): No  Physical Activity: Insufficiently Active (12/05/2020)   Exercise Vital Sign    Days of Exercise per Week: 3 days    Minutes of Exercise per Session: 30 min  Stress: No Stress Concern Present (12/05/2020)   [employer] of Occupational Health - Occupational Stress Questionnaire    Feeling of Stress : Not at all  Social Connections: Socially Integrated (12/05/2020)   Social Connection and Isolation Panel [NHANES]    Frequency of Communication with Friends and Family: More than three times a week    Frequency of Social Gatherings with Friends and Family: More than three times a week    Attends Religious Services: More than 4 times per year    Active Member of Clubs or Organizations: Yes    Attends Banker Meetings: More than 4 times per year    Marital Status: Married  Catering manager Violence: Not At Risk (12/05/2020)   Humiliation, Afraid, Rape, and Kick questionnaire    Fear of Current or Ex-Partner: No    Emotionally Abused: No    Physically Abused: No    Sexually Abused: No    Past Surgical History:  Procedure Laterality Date   ABDOMINAL HYSTERECTOMY     cervical dysplasia   BIOPSY  05/03/2018   Procedure: BIOPSY;  Surgeon: Meridee Score, Netty Starring., MD;  Location: [facility] ENDOSCOPY;  Service: Gastroenterology;;   BREAST BIOPSY Left    BREAST LUMPECTOMY Left    ESOPHAGOGASTRODUODENOSCOPY (EGD) WITH PROPOFOL N/A 05/03/2018   Procedure: ESOPHAGOGASTRODUODENOSCOPY (EGD) WITH PROPOFOL;  Surgeon: Lemar Lofty., MD;  Location[facility] END[facility]COPY;  Service: Gastroenterology;  Laterality: N/A;   ESOPHAGOGASTRODUODENOSCOPY (EGD) WITH PROPOFOL N/A 11/18/2018   Procedure: ESOPHAGOGASTRODUODENOSCOPY (EGD) WITH PROPOFOL;  Surgeon: Hilarie Fredrickson, MD;  Location: [facility] ENDOSCOPY;  Service: Endoscopy;  Laterality: N/A;   EUS  05/03/2018   Procedure: UPPER ENDOSCOPIC ULTRASOUND (EUS) RADIAL;  Surgeon:  Lemar Lofty., MD;  Location: [facility] ENDOSCOPY;  Service: Gastroenterology;;   FOREIGN BODY REMOVAL  11/18/2018   Procedure: FOREIGN BODY REMOVAL;  Surgeon: Hilarie Fredrickson, MD;  Location: [facility] ENDOSCOPY;  Service: Endoscopy;;   LEFT HEART CATH AND CORONARY ANGIOGRAPHY N/A 06/11/2017   Procedure: LEFT HEART CATH AND CORONARY ANGIOGRAPHY;  Surgeon: Lyn Records, MD;  Location: [facility] INVASIVE CV LAB;  Service: Cardiovascular;  Laterality: N/A;  Current Outpatient Medications:    albuterol (PROVENTIL) (2.5 MG/3ML) 0.083% nebulizer solution, Take 3 mLs (2.5 mg total) by nebulization every 6 (six) hours as needed for wheezing or shortness of breath., Disp: 75 mL, Rfl: 3   aspirin EC 81 MG tablet, Take 81 mg by mouth daily., Disp: , Rfl:    atorvastatin (LIPITOR) 40 MG tablet, Take 1 tablet by mouth once daily, Disp: 90 tablet, Rfl: 0   Blood Glucose Monitor Software DEVI, Test BS BID and PRN Dx E11.9, Disp: 1 Device, Rfl: 1   Cholecalciferol (VITAMIN D3) 2000 units TABS, Take 4,000 Units by mouth daily. , Disp: , Rfl:    famotidine (PEPCID) 40 MG tablet, Take 1 tablet by mouth at bedtime., Disp: , Rfl:    glimepiride (AMARYL) 2 MG tablet, Take 1 tablet by mouth daily as needed., Disp: , Rfl:    glucose blood test strip, test BS BID Dx E11.9, Disp: 200 each, Rfl: 3   Lancets (ONETOUCH ULTRASOFT) lancets, Test BS BID Dx E11.9, Disp: 200 each, Rfl: 3   losartan (COZAAR) 25 MG tablet, Take 1 tablet (25 mg total) by mouth daily., Disp: 90 tablet, Rfl: 1   metFORMIN (GLUCOPHAGE) 1000 MG tablet, Take 1 tablet (1,000 mg total) by mouth 2 (two) times daily with a meal., Disp: 180 tablet, Rfl: 1   Multiple Vitamins-Minerals (MULTIVITAMIN PO), Take 1 tablet by mouth daily., Disp: , Rfl:    nitroGLYCERIN (NITROSTAT) 0.4 MG SL tablet, Place 1 tablet (0.4 mg total) under the tongue every 5 (five) minutes as needed for chest pain., Disp: 25 tablet, Rfl: 3   omeprazole (PRILOSEC) 40 MG capsule, Take 1 capsule  (40 mg total) by mouth 2 (two) times daily before a meal. (NEEDS TO BE SEEN BEFORE NEXT REFILL), Disp: 180 capsule, Rfl: 1   umeclidinium-vilanterol (ANORO ELLIPTA) 62.5-25 MCG/INH AEPB, Inhale 1 puff by mouth once daily, Disp: 60 each, Rfl: 11    Physical Exam: There were no vitals taken for this visit.   Affect appropriate Healthy:  appears stated age HEENT: normal Neck supple with no adenopathy JVP normal no bruits no thyromegaly Lungs clear with no wheezing and good diaphragmatic motion Heart:  S1/S2 no murmur, no rub, gallop or click PMI normal Abdomen: benighn, BS positve, no tenderness, no AAA no bruit.  No HSM or HJR Distal pulses intact with no bruits No edema Neuro non-focal Skin warm and dry No muscular weakness   Labs:   Lab Results  Component Value Date   WBC 3.4 10/25/2020   HGB 10.4 (L) 10/25/2020   HCT 31.9 (L) 10/25/2020   MCV 86 10/25/2020   PLT 153 10/25/2020   No results for input(s): "NA", "K", "CL", "CO2", "BUN", "CREATININE", "CALCIUM", "PROT", "BILITOT", "ALKPHOS", "ALT", "AST", "GLUCOSE" in the last 168 hours.  Invalid input(s): "LABALBU" No results found for: "CKTOTAL", "CKMB", "CKMBINDEX", "TROPONINI"  Lab Results  Component Value Date   CHOL 203 (H) 10/25/2020   CHOL 202 (H) 09/01/2019   CHOL 159 03/23/2018   Lab Results  Component Value Date   HDL 52 10/25/2020   HDL 56 09/01/2019   HDL 41 03/23/2018   Lab Results  Component Value Date   LDLCALC 124 (H) 10/25/2020   LDLCALC 117 (H) 09/01/2019   LDLCALC 77 03/23/2018   Lab Results  Component Value Date   TRIG 154 (H) 10/25/2020   TRIG 166 (H) 09/01/2019   TRIG 206 (H) 03/23/2018   Lab Results  Component Value Date  CHOLHDL 3.9 10/25/2020   CHOLHDL 3.6 09/01/2019   CHOLHDL 3.9 03/23/2018   No results found for: "LDLDIRECT"    Radiology: No results found.  EKG: ST rate 133 no acute ST changes 11/19/18  07/10/2021 SR normal    ASSESSMENT AND PLAN:   CAD:  cath 2019  with CTO RCA left to right collaterals and 60% D1 stenosis Currently on ASA/Statin Change to crestor add back imdur 15 mg daily  COPD:  f/u [facility] no recent CXR/CT   Albuterol inhaler  Esophageal Cancer:  F/U [facility] on prilosec and pepcid Last EGD 2021 should have f/u with Dr Rhea Belton ? [facility] HTN:  continue losartan given DM HLD:  Last LDL in Epic 124 10/25/20 will start crestor 10 mg labs in 3 months  DM:  Discussed low carb diet.  Target hemoglobin A1c is 6.5 or less.  Continue current medications.  A1c 7.0 10/25/20   D/c Lipitor Start Crestor 10 mg Imdur 15 mg Lipid/Liver in 3 months   F/U in 6 months   Signed: Charlton Haws 07/05/2021, 12:58 PM

## 2021-07-10 ENCOUNTER — Encounter: Payer: Self-pay | Admitting: Cardiovascular Disease

## 2021-07-10 ENCOUNTER — Ambulatory Visit: Payer: Medicare HMO | Admitting: Cardiovascular Disease

## 2021-07-10 VITALS — BP 120/70 | HR 95 | Ht 61.0 in | Wt 147.0 lb

## 2021-07-10 DIAGNOSIS — E785 Hyperlipidemia, unspecified: Secondary | ICD-10-CM

## 2021-07-10 DIAGNOSIS — I2510 Atherosclerotic heart disease of native coronary artery without angina pectoris: Secondary | ICD-10-CM | POA: Diagnosis not present

## 2021-07-10 DIAGNOSIS — E1169 Type 2 diabetes mellitus with other specified complication: Secondary | ICD-10-CM

## 2021-07-10 DIAGNOSIS — J431 Panlobular emphysema: Secondary | ICD-10-CM

## 2021-07-10 MED ORDER — ROSUVASTATIN CALCIUM 10 MG PO TABS: 10.0000 mg | ORAL_TABLET | Freq: Every day | ORAL | 3 refills | Status: DC

## 2021-07-10 MED ORDER — ISOSORBIDE MONONITRATE ER 30 MG PO TB24: 15.0000 mg | ORAL_TABLET | Freq: Every day | ORAL | 3 refills | Status: DC

## 2021-07-10 NOTE — Patient Instructions (Signed)
Medication Instructions:  Your physician has recommended you make the following change in your medication:   Stop Taking Atorvastatin Start Crestor 10 mg Daily  Start Imdur 15 mg  Daily    *If you need a refill on your cardiac medications before your next appointment, please call your pharmacy*   Lab Work: Your physician recommends that you return for lab work in: 3 Months Fasting   If you have labs (blood work) drawn today and your tests are completely normal, you will receive your results only by: [place] (if you have [place]) OR A paper copy in the mail If you have any lab test that is abnormal or we need to change your treatment, we will call you to review the results.   Testing/Procedures: NONE    Follow-Up: At [facility], you and your health needs are our priority.  As part of our continuing mission to provide you with exceptional heart care, we have created designated Provider Care Teams.  These Care Teams include your primary Cardiologist (physician) and Advanced Practice Providers (APPs -  Physician Assistants and Nurse Practitioners) who all work together to provide you with the care you need, when you need it.  We recommend signing up for the patient portal called "MyChart".  Sign up information is provided on this After Visit Summary.  MyChart is used to connect with patients for Virtual Visits (Telemedicine).  Patients are able to view lab/test results, encounter notes, upcoming appointments, etc.  Non-urgent messages can be sent to your provider as well.   To learn more about what you can do with MyChart, go to [email or web address].    Your next appointment:   6 month(s)  The format for your next appointment:   In Person  Provider:   Jenkins Rouge, MD    Other Instructions Thank you for choosing [facility]!    Important Information About Sugar

## 2021-07-15 NOTE — Progress Notes (Signed)
[facility] Quality Team Note  Name: Erica Braun Date of Birth: May 21, 1950 MRN: [id number] Date: 07/15/2021  [facility] Quality Team has reviewed this patient's chart, please see recommendations below:  Diabetic Retinal Eye Exam; Patient requests [facility] Quality Coordinator to schedule Diabetic Retinal Screening at ([place] on 07/18/2021 at 2:30 PM).

## 2021-07-17 ENCOUNTER — Ambulatory Visit (INDEPENDENT_AMBULATORY_CARE_PROVIDER_SITE_OTHER): Payer: Medicare HMO | Admitting: Family Medicine

## 2021-07-17 ENCOUNTER — Encounter: Payer: Self-pay | Admitting: Family Medicine

## 2021-07-17 VITALS — BP 154/68 | HR 83 | Temp 97.3°F | Ht 61.0 in | Wt 147.6 lb

## 2021-07-17 DIAGNOSIS — M533 Sacrococcygeal disorders, not elsewhere classified: Secondary | ICD-10-CM

## 2021-07-17 DIAGNOSIS — R1012 Left upper quadrant pain: Secondary | ICD-10-CM | POA: Diagnosis not present

## 2021-07-17 DIAGNOSIS — M7918 Myalgia, other site: Secondary | ICD-10-CM

## 2021-07-17 MED ORDER — METHOCARBAMOL 500 MG PO TABS: 500.0000 mg | ORAL_TABLET | Freq: Three times a day (TID) | ORAL | 0 refills | Status: DC | PRN

## 2021-07-17 NOTE — Progress Notes (Signed)
Assessment & Plan:  1-2. Musculoskeletal pain/Coccyx pain Education provided on musculoskeletal pain. Continue Tylenol since it is helpful. May also use Robaxin as needed. - methocarbamol (ROBAXIN) 500 MG tablet; Take 1 tablet (500 mg total) by mouth every 8 (eight) hours as needed for muscle spasms.  Dispense: 60 tablet; Refill: 0  3. Left upper quadrant abdominal pain Labs just to ensure nothing is going on in the left upper abdomen.  - CBC with Differential/Platelet - Amylase - Lipase   Follow up plan: Return if symptoms worsen or fail to improve.  Hendricks Limes, MSN, APRN, [name]-C [facility]  Subjective:   Patient ID: Erica Braun, female    DOB: 11-25-1950, 71 y.o.   MRN: [id number]  HPI: Erica Braun is a 71 y.o. female presenting on 07/17/2021 for Flank Pain (Left side x 5 days. Patient states she feels the pain worse when she takes a deep breath. ) and Tailbone Pain (Had a fall on 6/26/)  Patient reports she fell a little over two weeks ago and landed on her tailbone. She was getting in the tub and her bathmat was not suctioned down; when it slipped from under her, she went backwards. She also has a pain on the left side of her torso that travels around to her back when she takes a deep breath. This pain just started five days ago; she describes it as nagging. Tylenol is helpful in relieving her pains.    ROS: Negative unless specifically indicated above in HPI.   Relevant past medical history reviewed and updated as indicated.   Allergies and medications reviewed and updated.   Current Outpatient Medications:    albuterol (PROVENTIL) (2.5 MG/3ML) 0.083% nebulizer solution, Take 3 mLs (2.5 mg total) by nebulization every 6 (six) hours as needed for wheezing or shortness of breath., Disp: 75 mL, Rfl: 3   aspirin EC 81 MG tablet, Take 81 mg by mouth daily., Disp: , Rfl:    Blood Glucose Monitor Software DEVI, Test BS BID and  PRN Dx E11.9, Disp: 1 Device, Rfl: 1   Cholecalciferol (VITAMIN D3) 2000 units TABS, Take 4,000 Units by mouth daily. , Disp: , Rfl:    famotidine (PEPCID) 40 MG tablet, Take 1 tablet by mouth at bedtime., Disp: , Rfl:    glimepiride (AMARYL) 2 MG tablet, Take 1 tablet by mouth daily as needed., Disp: , Rfl:    glucose blood test strip, test BS BID Dx E11.9, Disp: 200 each, Rfl: 3   isosorbide mononitrate (IMDUR) 30 MG 24 hr tablet, Take 0.5 tablets (15 mg total) by mouth daily., Disp: 45 tablet, Rfl: 3   Lancets (ONETOUCH ULTRASOFT) lancets, Test BS BID Dx E11.9, Disp: 200 each, Rfl: 3   losartan (COZAAR) 25 MG tablet, Take 1 tablet (25 mg total) by mouth daily., Disp: 90 tablet, Rfl: 1   metFORMIN (GLUCOPHAGE) 1000 MG tablet, Take 1 tablet (1,000 mg total) by mouth 2 (two) times daily with a meal., Disp: 180 tablet, Rfl: 1   Multiple Vitamins-Minerals (MULTIVITAMIN PO), Take 1 tablet by mouth daily., Disp: , Rfl:    omeprazole (PRILOSEC) 40 MG capsule, Take 1 capsule (40 mg total) by mouth 2 (two) times daily before a meal. (NEEDS TO BE SEEN BEFORE NEXT REFILL), Disp: 180 capsule, Rfl: 1   rosuvastatin (CRESTOR) 10 MG tablet, Take 1 tablet (10 mg total) by mouth daily., Disp: 90 tablet, Rfl: 3   umeclidinium-vilanterol (ANORO ELLIPTA) 62.5-25 MCG/INH AEPB, Inhale 1  puff by mouth once daily, Disp: 60 each, Rfl: 11   nitroGLYCERIN (NITROSTAT) 0.4 MG SL tablet, Place 1 tablet (0.4 mg total) under the tongue every 5 (five) minutes as needed for chest pain., Disp: 25 tablet, Rfl: 3  No Known Allergies  Objective:   BP (!) 154/68   Pulse 83   Temp (!) 97.3 F (36.3 C) (Temporal)   Ht '5\' 1"'$  (1.549 m)   Wt 147 lb 9.6 oz (67 kg)   SpO2 96%   BMI 27.89 kg/m    Physical Exam Vitals reviewed.  Constitutional:      General: She is not in acute distress.    Appearance: Normal appearance. She is not ill-appearing, toxic-appearing or diaphoretic.  HENT:     Head: Normocephalic and atraumatic.   Eyes:     General: No scleral icterus.       Right eye: No discharge.        Left eye: No discharge.     Conjunctiva/sclera: Conjunctivae normal.  Cardiovascular:     Rate and Rhythm: Normal rate.  Pulmonary:     Effort: Pulmonary effort is normal. No respiratory distress.  Abdominal:     General: Bowel sounds are normal. There is no distension or abdominal bruit. There are no signs of injury.     Palpations: Abdomen is soft. There is no shifting dullness, fluid wave, hepatomegaly, splenomegaly, mass or pulsatile mass.     Tenderness: There is no abdominal tenderness.  Musculoskeletal:        General: Normal range of motion.     Cervical back: Normal range of motion.     Comments: Coccyx tenderness. Muscular pain on left side of torso.  Skin:    General: Skin is warm and dry.     Capillary Refill: Capillary refill takes less than 2 seconds.  Neurological:     General: No focal deficit present.     Mental Status: She is alert and oriented to person, place, and time. Mental status is at baseline.  Psychiatric:        Mood and Affect: Mood normal.        Behavior: Behavior normal.        Thought Content: Thought content normal.        Judgment: Judgment normal.

## 2021-07-18 DIAGNOSIS — G4733 Obstructive sleep apnea (adult) (pediatric): Secondary | ICD-10-CM | POA: Diagnosis not present

## 2021-07-18 LAB — CBC WITH DIFFERENTIAL/PLATELET
Basophils Absolute: 0 10*3/uL (ref 0.0–0.2)
Basos: 1 %
EOS (ABSOLUTE): 0.1 10*3/uL (ref 0.0–0.4)
Eos: 2 %
Hematocrit: 34.3 % (ref 34.0–46.6)
Hemoglobin: 10.8 g/dL — ABNORMAL LOW (ref 11.1–15.9)
Immature Grans (Abs): 0 10*3/uL (ref 0.0–0.1)
Immature Granulocytes: 0 %
Lymphocytes Absolute: 0.6 10*3/uL — ABNORMAL LOW (ref 0.7–3.1)
Lymphs: 19 %
MCH: 27.1 pg (ref 26.6–33.0)
MCHC: 31.5 g/dL (ref 31.5–35.7)
MCV: 86 fL (ref 79–97)
Monocytes Absolute: 0.3 10*3/uL (ref 0.1–0.9)
Monocytes: 10 %
Neutrophils Absolute: 2.2 10*3/uL (ref 1.4–7.0)
Neutrophils: 68 %
Platelets: 136 10*3/uL — ABNORMAL LOW (ref 150–450)
RBC: 3.99 x10E6/uL (ref 3.77–5.28)
RDW: 13.6 % (ref 11.7–15.4)
WBC: 3.2 10*3/uL — ABNORMAL LOW (ref 3.4–10.8)

## 2021-07-18 LAB — LIPASE: Lipase: 17 U/L (ref 14–85)

## 2021-07-18 LAB — AMYLASE: Amylase: 44 U/L (ref 31–110)

## 2021-07-18 LAB — HM DIABETES EYE EXAM

## 2021-07-25 ENCOUNTER — Encounter: Payer: Self-pay | Admitting: Internal Medicine

## 2021-07-25 ENCOUNTER — Ambulatory Visit: Payer: Medicare HMO | Admitting: Internal Medicine

## 2021-07-25 VITALS — BP 118/72 | HR 114 | Ht 61.0 in | Wt 146.5 lb

## 2021-07-25 DIAGNOSIS — K222 Esophageal obstruction: Secondary | ICD-10-CM

## 2021-07-25 DIAGNOSIS — R1310 Dysphagia, unspecified: Secondary | ICD-10-CM | POA: Diagnosis not present

## 2021-07-25 DIAGNOSIS — K219 Gastro-esophageal reflux disease without esophagitis: Secondary | ICD-10-CM

## 2021-07-25 DIAGNOSIS — Z8501 Personal history of malignant neoplasm of esophagus: Secondary | ICD-10-CM

## 2021-07-25 NOTE — Progress Notes (Signed)
Patient ID: Erica Braun, female   DOB: [birth year], 71 y.o.   MRN: [id number] HPI: Erica Braun is a 71 year old female with past medical squamous cell carcinoma of the esophagus treated by ESD by Dr. Leroy Sea at [facility], posttreatment esophageal stricture, history of GERD, hypertension, hyperlipidemia, diabetes and sleep apnea who is seen to reestablish care.  She is here alone today.  After discovering her squamous cell esophageal cancer I referred her to [facility] where she had endoscopic submucosal dissection.  This was complicated by stricture formation requiring stent placement which was subsequently removed.  She has needed periodic dilation since that time.  Her last procedure was January 2023.  A benign-appearing stenosis was seen at 27 cm from the incisors.  It was dilated with the scope at that time.  She reports that on the whole she is feeling well.  She has solid food dysphagia for foods like breads and some meats.  She can handle ground beef but has to chew her food very well.  Sticky foods like rice are also a problem.  She is not having heartburn but she can feel acid at times, up into the back of her throat.  She is using 40 of omeprazole twice daily and famotidine 40 mg at bedtime.  Still at times during a meal she will have to leave the table to regurgitate or vomit food which has stuck in her esophagus.  She denies issues with lower GI pain, change in bowel habit, blood in stool or melena.  Past Medical History:  Diagnosis Date   Complication of anesthesia    COPD (chronic obstructive pulmonary disease) ([place])    Coronary artery disease    Diabetes mellitus without complication (HCC)    Diverticulosis    Early cataracts, bilateral 11/21/2016   Emphysema of lung (HCC)    Esophageal cancer (HCC)    GERD (gastroesophageal reflux disease)    Glaucoma    Hyperlipidemia    Hypertension    Internal hemorrhoids    Pneumonia    PONV (postoperative nausea and vomiting)    Sleep  apnea    uses Cpap   Spasm of the cricopharyngeus muscle    Squamous cell esophageal cancer ([place]) 05/10/2018    Past Surgical History:  Procedure Laterality Date   ABDOMINAL HYSTERECTOMY     cervical dysplasia   BIOPSY  05/03/2018   Procedure: BIOPSY;  Surgeon: Irving Copas., MD;  Location: [facility] ENDOSCOPY;  Service: Gastroenterology;;   BREAST BIOPSY Left    BREAST LUMPECTOMY Left    ESOPHAGOGASTRODUODENOSCOPY (EGD) WITH PROPOFOL N/A 05/03/2018   Procedure: ESOPHAGOGASTRODUODENOSCOPY (EGD) WITH PROPOFOL;  Surgeon: Irving Copas., MD;  Location: [facility] ENDOSCOPY;  Service: Gastroenterology;  Laterality: N/A;   ESOPHAGOGASTRODUODENOSCOPY (EGD) WITH PROPOFOL N/A 11/18/2018   Procedure: ESOPHAGOGASTRODUODENOSCOPY (EGD) WITH PROPOFOL;  Surgeon: Irene Shipper, MD;  Location: [facility] ENDOSCOPY;  Service: Endoscopy;  Laterality: N/A;   EUS  05/03/2018   Procedure: UPPER ENDOSCOPIC ULTRASOUND (EUS) RADIAL;  Surgeon: Irving Copas., MD;  Location: [facility] ENDOSCOPY;  Service: Gastroenterology;;   FOREIGN BODY REMOVAL  11/18/2018   Procedure: FOREIGN BODY REMOVAL;  Surgeon: Irene Shipper, MD;  Location: [facility] ENDOSCOPY;  Service: Endoscopy;;   LEFT HEART CATH AND CORONARY ANGIOGRAPHY N/A 06/11/2017   Procedure: LEFT HEART CATH AND CORONARY ANGIOGRAPHY;  Surgeon: Belva Crome, MD;  Location: [place] [facility] LAB;  Service: Cardiovascular;  Laterality: N/A;    Outpatient Medications Prior to Visit  Medication Sig Dispense Refill  albuterol (PROVENTIL) (2.5 MG/3ML) 0.083% nebulizer solution Take 3 mLs (2.5 mg total) by nebulization every 6 (six) hours as needed for wheezing or shortness of breath. 75 mL 3   aspirin EC 81 MG tablet Take 81 mg by mouth daily.     Blood Glucose Monitor Software DEVI Test BS BID and PRN Dx E11.9 1 Device 1   Cholecalciferol (VITAMIN D3) 2000 units TABS Take 4,000 Units by mouth daily.      famotidine (PEPCID) 40 MG tablet Take 1 tablet by mouth at bedtime.      glimepiride (AMARYL) 2 MG tablet Take 1 tablet by mouth daily as needed.     glucose blood test strip test BS BID Dx E11.9 200 each 3   isosorbide mononitrate (IMDUR) 30 MG 24 hr tablet Take 0.5 tablets (15 mg total) by mouth daily. 45 tablet 3   Lancets (ONETOUCH ULTRASOFT) lancets Test BS BID Dx E11.9 200 each 3   losartan (COZAAR) 25 MG tablet Take 1 tablet (25 mg total) by mouth daily. 90 tablet 1   metFORMIN (GLUCOPHAGE) 1000 MG tablet Take 1 tablet (1,000 mg total) by mouth 2 (two) times daily with a meal. 180 tablet 1   Multiple Vitamins-Minerals (MULTIVITAMIN PO) Take 1 tablet by mouth daily.     omeprazole (PRILOSEC) 40 MG capsule Take 1 capsule (40 mg total) by mouth 2 (two) times daily before a meal. (NEEDS TO BE SEEN BEFORE NEXT REFILL) 180 capsule 1   rosuvastatin (CRESTOR) 10 MG tablet Take 1 tablet (10 mg total) by mouth daily. 90 tablet 3   umeclidinium-vilanterol (ANORO ELLIPTA) 62.5-25 MCG/INH AEPB Inhale 1 puff by mouth once daily 60 each 11   nitroGLYCERIN (NITROSTAT) 0.4 MG SL tablet Place 1 tablet (0.4 mg total) under the tongue every 5 (five) minutes as needed for chest pain. 25 tablet 3   methocarbamol (ROBAXIN) 500 MG tablet Take 1 tablet (500 mg total) by mouth every 8 (eight) hours as needed for muscle spasms. 60 tablet 0   No facility-administered medications prior to visit.    No Known Allergies  Family History  Problem Relation Age of Onset   Cancer Mother        breast   Arthritis Mother    COPD Father 67       emphysema   Alcohol abuse Father    Diabetes Father    Cancer Sister        breast   Early death Brother        overdose   Drug abuse Brother    Alcohol abuse Paternal Aunt    COPD Paternal Aunt    Cancer Maternal Grandmother        liver cancer   Diabetes Maternal Grandmother    Alcohol abuse Maternal Grandfather    Early death Maternal Grandfather    Stroke Paternal Grandmother    Heart disease Paternal Grandfather    Esophageal cancer  Neg Hx    Stomach cancer Neg Hx     Social History   Tobacco Use   Smoking status: Former    Packs/day: 1.50    Years: 41.00    Total pack years: 61.50    Types: Cigarettes    Start date: 1969    Quit date: 2010    Years since quitting: 13.5   Smokeless tobacco: Never  Vaping Use   Vaping Use: Never used  Substance Use Topics   Alcohol use: No   Drug use: No  ROS: As per history of present illness, otherwise negative  BP 118/72   Pulse (!) 114   Ht '5\' 1"'$  (1.549 m)   Wt 146 lb 8 oz (66.5 kg)   SpO2 96%   BMI 27.68 kg/m  Gen: awake, alert, NAD HEENT: anicteric  CV: RRR, no mrg Pulm: CTA b/l Abd: soft, NT/ND, +BS throughout Ext: no c/c/e Neuro: nonfocal   RELEVANT LABS AND IMAGING: CBC    Component Value Date/Time   WBC 3.2 (L) 07/17/2021 0940   WBC 4.1 11/18/2018 1345   RBC 3.99 07/17/2021 0940   RBC 4.19 11/18/2018 1345   HGB 10.8 (L) 07/17/2021 0940   HCT 34.3 07/17/2021 0940   PLT 136 (L) 07/17/2021 0940   MCV 86 07/17/2021 0940   MCH 27.1 07/17/2021 0940   MCH 28.4 11/18/2018 1345   MCHC 31.5 07/17/2021 0940   MCHC 31.6 11/18/2018 1345   RDW 13.6 07/17/2021 0940   LYMPHSABS 0.6 (L) 07/17/2021 0940   MONOABS 0.5 05/11/2018 1203   EOSABS 0.1 07/17/2021 0940   BASOSABS 0.0 07/17/2021 0940    CMP     Component Value Date/Time   NA 136 10/25/2020 1048   K 4.0 10/25/2020 1048   CL 98 10/25/2020 1048   CO2 25 10/25/2020 1048   GLUCOSE 206 (H) 10/25/2020 1048   GLUCOSE 163 (H) 11/18/2018 1345   BUN 12 10/25/2020 1048   CREATININE 0.74 10/25/2020 1048   CREATININE 0.56 11/21/2016 1012   CALCIUM 9.8 10/25/2020 1048   PROT 6.4 10/25/2020 1048   ALBUMIN 4.3 10/25/2020 1048   AST 12 10/25/2020 1048   ALT 12 10/25/2020 1048   ALKPHOS 124 (H) 10/25/2020 1048   BILITOT 0.3 10/25/2020 1048   GFRNONAA 85 09/01/2019 1413   GFRNONAA 96 11/21/2016 1012   GFRAA 98 09/01/2019 1413   GFRAA 112 11/21/2016 1012    ASSESSMENT/PLAN: 71 year old  female with past medical squamous cell carcinoma of the esophagus treated by ESD by Dr. Leroy Sea at [facility], posttreatment esophageal stricture, history of GERD, hypertension, hyperlipidemia, diabetes and sleep apnea who is seen to reestablish care.   Esophageal dysphagia/esophageal stricture/GERD --she is having solid food dysphagia symptoms.  We discussed repeat upper endoscopy which I am recommending.  We reviewed the risk, benefits and alternatives and she is agreeable wishes to proceed.  We discussed that she may need serial upper endoscopies for dilation and we may consider intralesional steroid injection if we are able to achieve adequate esophageal size.  Continue acid suppression therapy to control heartburn and GERD --EGD in the Loop with dilation --Continue omeprazole 40 mg twice daily before meals and famotidine 40 mg near bedtime --GERD dietary precautions  2.  History of squamous cell esophageal cancer treated by endoscopic submucosal dissection --patient achieved cure by endoscopic management with resultant posttreatment stricture, see #1  3.  CRC screening --normal colonoscopy with the exception of small internal hemorrhoids and diverticulosis in March 2017; consider repeat in March 2027    QA:STMHD, Theador Hawth[address], [place],  [place] [id number]

## 2021-07-25 NOTE — Patient Instructions (Addendum)
If you are age 72 or older, your body mass index should be between 23-30. Your Body mass index is 27.68 kg/m. If this is out of the aforementioned range listed, please consider follow up with your Primary Care Provider.  If you are age 72 or younger, your body mass index should be between 19-25. Your Body mass index is 27.68 kg/m. If this is out of the aformentioned range listed, please consider follow up with your Primary Care Provider.   ________________________________________________________  The [place] [facility] providers would like to encourage you to use [facility] to communicate with providers for non-urgent requests or questions.  Due to long hold times on the telephone, sending your provider a message by [facility] may be a faster and more efficient way to get a response.  Please allow 48 business hours for a response.  Please remember that this is for non-urgent requests.  _______________________________________________________   Dennis Bast have been scheduled for an endoscopy. Please follow written instructions given to you at your visit today. If you use inhalers (even only as needed), please bring them with you on the day of your procedure.   Continue Omeprazole 40 mg twice a day Continue Famotidine   Due to recent changes in healthcare laws, you may see the results of your imaging and laboratory studies on MyChart before your provider has had a chance to review them.  We understand that in some cases there may be results that are confusing or concerning to you. Not all laboratory results come back in the same time frame and the provider may be waiting for multiple results in order to interpret others.  Please give [place] 48 hours in order for your provider to thoroughly review all the results before contacting the office for clarification of your results.    Thank you for entrusting me with your care and choosing [facility].  Dr.Pyrtle

## 2021-07-26 ENCOUNTER — Other Ambulatory Visit: Payer: Self-pay | Admitting: Family

## 2021-07-26 DIAGNOSIS — E119 Type 2 diabetes mellitus without complications: Secondary | ICD-10-CM

## 2021-08-05 ENCOUNTER — Ambulatory Visit (INDEPENDENT_AMBULATORY_CARE_PROVIDER_SITE_OTHER): Payer: Medicare HMO | Admitting: Family

## 2021-08-05 ENCOUNTER — Encounter: Payer: Self-pay | Admitting: Family

## 2021-08-05 VITALS — BP 104/58 | HR 82 | Temp 97.2°F | Ht 61.0 in | Wt 148.2 lb

## 2021-08-05 DIAGNOSIS — K2100 Gastro-esophageal reflux disease with esophagitis, without bleeding: Secondary | ICD-10-CM | POA: Diagnosis not present

## 2021-08-05 DIAGNOSIS — Z9989 Dependence on other enabling machines and devices: Secondary | ICD-10-CM | POA: Diagnosis not present

## 2021-08-05 DIAGNOSIS — E785 Hyperlipidemia, unspecified: Secondary | ICD-10-CM

## 2021-08-05 DIAGNOSIS — G4733 Obstructive sleep apnea (adult) (pediatric): Secondary | ICD-10-CM | POA: Diagnosis not present

## 2021-08-05 DIAGNOSIS — Z8501 Personal history of malignant neoplasm of esophagus: Secondary | ICD-10-CM | POA: Diagnosis not present

## 2021-08-05 DIAGNOSIS — I152 Hypertension secondary to endocrine disorders: Secondary | ICD-10-CM | POA: Diagnosis not present

## 2021-08-05 DIAGNOSIS — E119 Type 2 diabetes mellitus without complications: Secondary | ICD-10-CM

## 2021-08-05 DIAGNOSIS — J431 Panlobular emphysema: Secondary | ICD-10-CM | POA: Diagnosis not present

## 2021-08-05 DIAGNOSIS — E1159 Type 2 diabetes mellitus with other circulatory complications: Secondary | ICD-10-CM

## 2021-08-05 DIAGNOSIS — E1169 Type 2 diabetes mellitus with other specified complication: Secondary | ICD-10-CM

## 2021-08-05 DIAGNOSIS — I2510 Atherosclerotic heart disease of native coronary artery without angina pectoris: Secondary | ICD-10-CM | POA: Diagnosis not present

## 2021-08-05 LAB — BAYER DCA HB A1C WAIVED: HB A1C (BAYER DCA - WAIVED): 9.7 % — ABNORMAL HIGH (ref 4.8–5.6)

## 2021-08-05 MED ORDER — ALBUTEROL SULFATE (2.5 MG/3ML) 0.083% IN NEBU
2.5000 mg | INHALATION_SOLUTION | Freq: Four times a day (QID) | RESPIRATORY_TRACT | 3 refills | Status: DC | PRN
Start: 2021-08-05 — End: 2023-03-30

## 2021-08-05 MED ORDER — GLIMEPIRIDE 2 MG PO TABS: 2.0000 mg | ORAL_TABLET | Freq: Every day | ORAL | 1 refills | Status: DC | PRN

## 2021-08-05 MED ORDER — BLOOD GLUCOSE METER KIT: PACK | 0 refills | Status: DC

## 2021-08-05 MED ORDER — FAMOTIDINE 40 MG PO TABS
40.0000 mg | ORAL_TABLET | Freq: Every day | ORAL | 3 refills | Status: DC
Start: 2021-08-05 — End: 2023-01-01

## 2021-08-05 NOTE — Progress Notes (Signed)
Subjective:    Patient ID: Erica Braun, female    DOB: [birth year], 71 y.o.   MRN: [id number]  Chief Complaint  Patient presents with   Medical Management of Chronic Issues    No concerns    Pt presents to the office today for chronic follow up. She is followed by Cardiologists for CAD, pulmonologist's for COPD, and GI for esophageal dysphagia and hx of squamous cell esophageal cancer.  These are stable. Has COPD and quit smoking approx 2011. Has intermittent SOB that is worse in the heat.   Has OSA and uses CPAP nightly.  Hypertension This is a chronic problem. The current episode started more than 1 year ago. The problem has been resolved since onset. The problem is controlled. Pertinent negatives include no blurred vision, malaise/fatigue, peripheral edema or shortness of breath. Risk factors for coronary artery disease include dyslipidemia and sedentary lifestyle. The current treatment provides moderate improvement. There is no history of heart failure.  Gastroesophageal Reflux She complains of belching and heartburn. She reports no globus sensation. This is a chronic problem. The current episode started more than 1 year ago. The problem occurs occasionally. She has tried a histamine-2 antagonist for the symptoms. The treatment provided moderate relief.  Diabetes She presents for her follow-up diabetic visit. She has type 2 diabetes mellitus. Associated symptoms include foot paresthesias. Pertinent negatives for diabetes include no blurred vision. Symptoms are stable. Diabetic complications include peripheral neuropathy. Risk factors for coronary artery disease include diabetes mellitus, dyslipidemia, hypertension, sedentary lifestyle and post-menopausal. She is following a generally healthy diet. Her overall blood glucose range is 140-180 mg/dl. Eye exam is current.  Hyperlipidemia This is a chronic problem. The current episode started more than 1 year ago. The problem is  controlled. Pertinent negatives include no shortness of breath. Current antihyperlipidemic treatment includes statins. The current treatment provides moderate improvement of lipids. Risk factors for coronary artery disease include diabetes mellitus, dyslipidemia, hypertension and a sedentary lifestyle.      Review of Systems  Constitutional:  Negative for malaise/fatigue.  Eyes:  Negative for blurred vision.  Respiratory:  Negative for shortness of breath.   Gastrointestinal:  Positive for heartburn.  All other systems reviewed and are negative.      Objective:   Physical Exam Vitals reviewed.  Constitutional:      General: She is not in acute distress.    Appearance: She is well-developed.  HENT:     Head: Normocephalic and atraumatic.     Right Ear: Tympanic membrane normal.     Left Ear: Tympanic membrane normal.  Eyes:     Pupils: Pupils are equal, round, and reactive to light.  Neck:     Thyroid: No thyromegaly.  Cardiovascular:     Rate and Rhythm: Normal rate and regular rhythm.     Heart sounds: Normal heart sounds. No murmur heard. Pulmonary:     Effort: Pulmonary effort is normal. No respiratory distress.     Breath sounds: Normal breath sounds. No wheezing.  Abdominal:     General: Bowel sounds are normal. There is no distension.     Palpations: Abdomen is soft.     Tenderness: There is no abdominal tenderness.  Musculoskeletal:        General: No tenderness. Normal range of motion.     Cervical back: Normal range of motion and neck supple.  Skin:    General: Skin is warm and dry.  Neurological:     Mental Status:  She is alert and oriented to person, place, and time.     Cranial Nerves: No cranial nerve deficit.     Deep Tendon Reflexes: Reflexes are normal and symmetric.  Psychiatric:        Behavior: Behavior normal.        Thought Content: Thought content normal.        Judgment: Judgment normal.       BP (!) 104/58   Pulse 82   Temp (!) 97.2 F  (36.2 C) (Oral)   Ht _0  (1.549 m)   Wt 148 lb 3.2 oz (67.2 kg)   SpO2 97%   BMI 28.00 kg/m      Assessment & Plan:  Erica Braun comes in today with chief complaint of Medical Management of Chronic Issues (No concerns )   Diagnosis and orders addressed:  1. Hypertension associated with diabetes ([place]) - CMP14+EGFR  2. CAD in native artery - CMP14+EGFR  3. Panlobular emphysema (HCC) - albuterol (PROVENTIL) (2.5 MG/3ML) 0.083% nebulizer solution; Take 3 mLs (2.5 mg total) by nebulization every 6 (six) hours as needed for wheezing or shortness of breath.  Dispense: 75 mL; Refill: 3 - CMP14+EGFR  4. OSA on CPAP - CMP14+EGFR  5. Gastroesophageal reflux disease with esophagitis without hemorrhage - famotidine (PEPCID) 40 MG tablet; Take 1 tablet (40 mg total) by mouth at bedtime.  Dispense: 90 tablet; Refill: 3 - CMP14+EGFR  6. Type 2 diabetes mellitus without complication, without long-term current use of insulin (HCC) - blood glucose meter kit and supplies; Dispense based on patient and insurance preference. Use up to four times daily as directed. (FOR ICD-10 E10.9, E11.9).  Dispense: 1 each; Refill: 0 - glimepiride (AMARYL) 2 MG tablet; Take 1 tablet (2 mg total) by mouth daily as needed.  Dispense: 90 tablet; Refill: 1 - CMP14+EGFR - Bayer DCA Hb A1c Waived - Microalbumin / creatinine urine ratio  7. Hyperlipidemia associated with type 2 diabetes mellitus (HCC) - CMP14+EGFR  8. History of esophageal cancer - CMP14+EGFR   Labs pending Health Maintenance reviewed Diet and exercise encouraged  Follow up plan: 6 months    Evelina Dun, [name]

## 2021-08-05 NOTE — Patient Instructions (Signed)

## 2021-08-06 ENCOUNTER — Other Ambulatory Visit: Payer: Self-pay | Admitting: Family

## 2021-08-06 LAB — CMP14+EGFR
ALT: 12 IU/L (ref 0–32)
AST: 8 IU/L (ref 0–40)
Albumin/Globulin Ratio: 1.9 (ref 1.2–2.2)
Albumin: 4.4 g/dL (ref 3.8–4.8)
Alkaline Phosphatase: 118 IU/L (ref 44–121)
BUN/Creatinine Ratio: 21 (ref 12–28)
BUN: 16 mg/dL (ref 8–27)
Bilirubin Total: 0.4 mg/dL (ref 0.0–1.2)
CO2: 25 mmol/L (ref 20–29)
Calcium: 9.6 mg/dL (ref 8.7–10.3)
Chloride: 96 mmol/L (ref 96–106)
Creatinine, Ser: 0.75 mg/dL (ref 0.57–1.00)
Globulin, Total: 2.3 g/dL (ref 1.5–4.5)
Glucose: 249 mg/dL — ABNORMAL HIGH (ref 70–99)
Potassium: 4.5 mmol/L (ref 3.5–5.2)
Sodium: 137 mmol/L (ref 134–144)
Total Protein: 6.7 g/dL (ref 6.0–8.5)
eGFR: 85 mL/min/{1.73_m2} (ref >59)

## 2021-08-06 LAB — MICROALBUMIN / CREATININE URINE RATIO
Creatinine, Urine: 36.4 mg/dL
Microalb/Creat Ratio: 21 mg/g creat (ref 0–29)
Microalbumin, Urine: 7.5 ug/mL

## 2021-08-06 MED ORDER — EMPAGLIFLOZIN 10 MG PO TABS: 10.0000 mg | ORAL_TABLET | Freq: Every day | ORAL | 1 refills | Status: DC

## 2021-08-07 ENCOUNTER — Other Ambulatory Visit: Payer: Self-pay | Admitting: Family

## 2021-08-07 DIAGNOSIS — E1122 Type 2 diabetes mellitus with diabetic chronic kidney disease: Secondary | ICD-10-CM

## 2021-08-07 DIAGNOSIS — K2100 Gastro-esophageal reflux disease with esophagitis, without bleeding: Secondary | ICD-10-CM

## 2021-08-08 ENCOUNTER — Telehealth: Payer: Self-pay | Admitting: Internal Medicine

## 2021-08-08 ENCOUNTER — Other Ambulatory Visit: Payer: Self-pay | Admitting: Family

## 2021-08-08 DIAGNOSIS — E1122 Type 2 diabetes mellitus with diabetic chronic kidney disease: Secondary | ICD-10-CM

## 2021-08-08 DIAGNOSIS — K2100 Gastro-esophageal reflux disease with esophagitis, without bleeding: Secondary | ICD-10-CM

## 2021-08-08 NOTE — Telephone Encounter (Signed)
Discussed with pt that she should take it normally the day before the procedure but hold it the day of the procedure. She verbalized understanding.

## 2021-08-08 NOTE — Telephone Encounter (Signed)
Patient has a procedure with Dr. Hilarie Fredrickson on Monday.  Her doctor recently started her on Jardiance.  Should she hold that on the day of her procedure?  Please call patient and advise.  Thank you.

## 2021-08-12 ENCOUNTER — Ambulatory Visit (AMBULATORY_SURGERY_CENTER): Payer: Medicare HMO | Admitting: Internal Medicine

## 2021-08-12 ENCOUNTER — Encounter: Payer: Self-pay | Admitting: Internal Medicine

## 2021-08-12 VITALS — BP 120/66 | HR 84 | Temp 96.8°F | Resp 19 | Ht 61.0 in | Wt 146.0 lb

## 2021-08-12 DIAGNOSIS — K222 Esophageal obstruction: Secondary | ICD-10-CM

## 2021-08-12 DIAGNOSIS — Z8501 Personal history of malignant neoplasm of esophagus: Secondary | ICD-10-CM

## 2021-08-12 DIAGNOSIS — K449 Diaphragmatic hernia without obstruction or gangrene: Secondary | ICD-10-CM | POA: Diagnosis not present

## 2021-08-12 DIAGNOSIS — R1310 Dysphagia, unspecified: Secondary | ICD-10-CM | POA: Diagnosis not present

## 2021-08-12 DIAGNOSIS — R1319 Other dysphagia: Secondary | ICD-10-CM

## 2021-08-12 MED ORDER — SODIUM CHLORIDE 0.9 % IV SOLN: 500.0000 mL | Freq: Once | INTRAVENOUS | Status: DC

## 2021-08-12 NOTE — Progress Notes (Signed)
See office note dated 07/25/2021 for details and current H&P  Patient presenting for upper endoscopy with probable dilation of the esophagus  She remains appropriate for EGD today

## 2021-08-12 NOTE — Op Note (Addendum)
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 The patient's tolerance of                            previous anesthesia was also reviewed. The risks                            and benefits of the procedure and the sedation                            options and risks were discussed with the patient.                            All questions were answered, and informed consent                            was obtained. Prior Anticoagulants: The patient has                            taken no previous anticoagulant or antiplatelet                            agents. ASA Grade Assessment: III - A patient with                            severe systemic disease. After reviewing the risks                            and benefits, the patient was deemed in  satisfactory condition to undergo the procedure.                           After obtaining informed consent, the endoscope was                            passed under direct vision. Throughout the                            procedure, the patient's blood pressure, pulse, and                            oxygen saturations were monitored continuously. The                            GIF [id number]  #[id number] was introduced through the                            mouth, and advanced to the second part of duodenum.                            The upper GI endoscopy was accomplished without                            difficulty. The patient tolerated the procedure                            well. Scope In: Scope Out: Findings:                 One benign-appearing, intrinsic moderate                            (circumferential scarring or stenosis; an endoscope                            may pass) stenosis was found 27 cm from the                            incisors. This stenosis measured 8 mm (inner                            diameter) x 1 cm (in length). The stenosis was                            traversed and there after there was a moderate                            mucosal rent with good dilation-type result. No                            evidence of recurrent neoplasm.                           A 1 cm hiatal hernia  was present.                           The entire examined stomach was normal.                           The examined duodenum was normal. Complications:            No immediate complications. Estimated Blood Loss:     Estimated blood loss was minimal. Impression:               - Benign-appearing esophageal stenosis. Dilated                            with the scope.                           - Otherwise normal esophageal mucosa.                           - 1 cm hiatal hernia.                           - Normal stomach.                           - Normal examined duodenum.                           - No specimens collected. Recommendation:           - Patient has a contact number available for                            emergencies. The signs and symptoms of potential                            delayed complications were discussed with the                            patient. Return to normal activities tomorrow.                            Written discharge  instructions were provided to the                            patient.                           - Advance diet as tolerated.                           - Continue present medications.                           - Repeat upper endoscopy in 4 weeks for retreatment                            as stenosis  at prior treatment location in                            mid-esophagus remains. Jerene Bears, MD 08/12/2021 12:48:01 PM This report has been signed electronically.

## 2021-08-12 NOTE — Progress Notes (Signed)
Sedate, gd SR, tolerated procedure well, VSS, report to RN 

## 2021-08-12 NOTE — Patient Instructions (Signed)
Read al of the handouts given to you by your recovery room.  YOU HAD AN ENDOSCOPIC PROCEDURE TODAY AT [facility]:   Refer to the procedure report that was given to you for any specific questions about what was found during the examination.  If the procedure report does not answer your questions, please call your gastroenterologist to clarify.  If you requested that your care partner not be given the details of your procedure findings, then the procedure report has been included in a sealed envelope for you to review at your convenience later.  YOU SHOULD EXPECT: Some feelings of bloating in the abdomen. Passage of more gas than usual.    Please Note:  You might notice some irritation and congestion in your nose or some drainage.  This is from the oxygen used during your procedure.  There is no need for concern and it should clear up in a day or so.  SYMPTOMS TO REPORT IMMEDIATELY:   Following upper endoscopy (EGD)  Vomiting of blood or coffee ground material  New chest pain or pain under the shoulder blades  Painful or persistently difficult swallowing  New shortness of breath  Fever of 100F or higher  Black, tarry-looking stools  For urgent or emergent issues, a gastroenterologist can be reached at any hour by calling [phone]. Do not use MyChart messaging for urgent concerns.    DIET:  We do recommend a small meal at first, but then you may proceed to your regular diet.  Drink plenty of fluids but you should avoid alcoholic beverages for 24 hours.  ACTIVITY:  You should plan to take it easy for the rest of today and you should NOT DRIVE or use heavy machinery until tomorrow (because of the sedation medicines used during the test).    FOLLOW UP: Our staff will call the number listed on your records the next business day following your procedure.  We will call around 7:15- 8:00 am to check on you and address any questions or concerns that you may have regarding  the information given to you following your procedure. If we do not reach you, we will leave a message.  If you develop any symptoms (ie: fever, flu-like symptoms, shortness of breath, cough etc.) before then, please call [phone].  If you test positive for Covid 19 in the 2 weeks post procedure, please call and report this information to [place].     SIGNATURES/CONFIDENTIALITY: You and/or your care partner have signed paperwork which will be entered into your electronic medical record.  These signatures attest to the fact that that the information above on your After Visit Summary has been reviewed and is understood.  Full responsibility of the confidentiality of this discharge information lies with you and/or your care-partner.

## 2021-08-13 ENCOUNTER — Telehealth: Payer: Self-pay | Admitting: *Deleted

## 2021-08-13 NOTE — Telephone Encounter (Signed)
  Follow up Call-     08/12/2021   11:08 AM  Call back number  Post procedure Call Back phone  # [phone]  Permission to leave phone message Yes     Patient questions:  Do you have a fever, pain , or abdominal swelling? No. Pain Score  0 *  Have you tolerated food without any problems? Yes.    Have you been able to return to your normal activities? Yes.    Do you have any questions about your discharge instructions: Diet   No. Medications  No. Follow up visit  No.  Do you have questions or concerns about your Care? No.  Actions: * If pain score is 4 or above: No action needed, pain <4.

## 2021-08-18 DIAGNOSIS — G4733 Obstructive sleep apnea (adult) (pediatric): Secondary | ICD-10-CM | POA: Diagnosis not present

## 2021-09-03 ENCOUNTER — Other Ambulatory Visit: Payer: Self-pay | Admitting: *Deleted

## 2021-09-03 DIAGNOSIS — E119 Type 2 diabetes mellitus without complications: Secondary | ICD-10-CM

## 2021-09-03 MED ORDER — METFORMIN HCL 1000 MG PO TABS: 1000.0000 mg | ORAL_TABLET | Freq: Two times a day (BID) | ORAL | 0 refills | Status: DC

## 2021-09-10 ENCOUNTER — Ambulatory Visit (INDEPENDENT_AMBULATORY_CARE_PROVIDER_SITE_OTHER): Payer: Medicare HMO | Admitting: Family

## 2021-09-10 ENCOUNTER — Encounter: Payer: Self-pay | Admitting: Family

## 2021-09-10 VITALS — BP 109/60 | HR 84 | Temp 97.4°F | Ht 61.0 in | Wt 141.6 lb

## 2021-09-10 DIAGNOSIS — E785 Hyperlipidemia, unspecified: Secondary | ICD-10-CM | POA: Diagnosis not present

## 2021-09-10 DIAGNOSIS — E1159 Type 2 diabetes mellitus with other circulatory complications: Secondary | ICD-10-CM | POA: Diagnosis not present

## 2021-09-10 DIAGNOSIS — I152 Hypertension secondary to endocrine disorders: Secondary | ICD-10-CM | POA: Diagnosis not present

## 2021-09-10 DIAGNOSIS — K2100 Gastro-esophageal reflux disease with esophagitis, without bleeding: Secondary | ICD-10-CM | POA: Diagnosis not present

## 2021-09-10 DIAGNOSIS — J431 Panlobular emphysema: Secondary | ICD-10-CM | POA: Diagnosis not present

## 2021-09-10 DIAGNOSIS — Z9989 Dependence on other enabling machines and devices: Secondary | ICD-10-CM | POA: Diagnosis not present

## 2021-09-10 DIAGNOSIS — E119 Type 2 diabetes mellitus without complications: Secondary | ICD-10-CM | POA: Diagnosis not present

## 2021-09-10 DIAGNOSIS — G4733 Obstructive sleep apnea (adult) (pediatric): Secondary | ICD-10-CM

## 2021-09-10 DIAGNOSIS — I2510 Atherosclerotic heart disease of native coronary artery without angina pectoris: Secondary | ICD-10-CM

## 2021-09-10 DIAGNOSIS — E1169 Type 2 diabetes mellitus with other specified complication: Secondary | ICD-10-CM

## 2021-09-10 LAB — CMP14+EGFR
ALT: 11 IU/L (ref 0–32)
AST: 12 IU/L (ref 0–40)
Albumin/Globulin Ratio: 2.3 — ABNORMAL HIGH (ref 1.2–2.2)
Albumin: 4.3 g/dL (ref 3.8–4.8)
Alkaline Phosphatase: 90 IU/L (ref 44–121)
BUN/Creatinine Ratio: 20 (ref 12–28)
BUN: 15 mg/dL (ref 8–27)
Bilirubin Total: 0.5 mg/dL (ref 0.0–1.2)
CO2: 22 mmol/L (ref 20–29)
Calcium: 9.4 mg/dL (ref 8.7–10.3)
Chloride: 100 mmol/L (ref 96–106)
Creatinine, Ser: 0.76 mg/dL (ref 0.57–1.00)
Globulin, Total: 1.9 g/dL (ref 1.5–4.5)
Glucose: 89 mg/dL (ref 70–99)
Potassium: 4.1 mmol/L (ref 3.5–5.2)
Sodium: 136 mmol/L (ref 134–144)
Total Protein: 6.2 g/dL (ref 6.0–8.5)
eGFR: 83 mL/min/{1.73_m2} (ref >59)

## 2021-09-10 LAB — LIPID PANEL
Chol/HDL Ratio: 2.3 ratio (ref 0.0–4.4)
Cholesterol, Total: 126 mg/dL (ref 100–199)
HDL: 54 mg/dL (ref >39)
LDL Chol Calc (NIH): 49 mg/dL (ref 0–99)
Triglycerides: 135 mg/dL (ref 0–149)
VLDL Cholesterol Cal: 23 mg/dL (ref 5–40)

## 2021-09-10 LAB — BAYER DCA HB A1C WAIVED: HB A1C (BAYER DCA - WAIVED): 6.4 % — ABNORMAL HIGH (ref 4.8–5.6)

## 2021-09-10 NOTE — Patient Instructions (Signed)

## 2021-09-10 NOTE — Progress Notes (Signed)
Subjective:    Patient ID: Erica Braun, female    DOB: [birth year], 71 y.o.   MRN: [id number]  Chief Complaint  Patient presents with   Follow-up   Pt presents to the office today for chronic follow up. She is followed by Cardiologists for CAD, pulmonologist's for COPD, and GI for esophageal dysphagia and hx of squamous cell esophageal cancer.   These are stable. Has COPD and quit smoking approx 2011. Has intermittent SOB that is worse in the heat.    Has OSA and uses CPAP nightly.  Hypertension This is a chronic problem. The current episode started more than 1 year ago. The problem has been resolved since onset. The problem is controlled. Pertinent negatives include no blurred vision, malaise/fatigue, peripheral edema or shortness of breath. Risk factors for coronary artery disease include dyslipidemia, diabetes mellitus, obesity and sedentary lifestyle. The current treatment provides moderate improvement. There is no history of heart failure.  Gastroesophageal Reflux She complains of belching and heartburn. This is a chronic problem. The current episode started more than 1 year ago. The problem occurs occasionally. She has tried a PPI for the symptoms. The treatment provided moderate relief.  Diabetes She presents for her follow-up diabetic visit. She has type 2 diabetes mellitus. Pertinent negatives for diabetes include no blurred vision and no foot paresthesias. Symptoms are stable. Risk factors for coronary artery disease include dyslipidemia, diabetes mellitus, hypertension, sedentary lifestyle and post-menopausal. She is following a generally healthy diet. Her overall blood glucose range is 110-130 mg/dl.  Hyperlipidemia This is a chronic problem. The current episode started more than 1 year ago. Recent lipid tests were reviewed and are normal. Pertinent negatives include no shortness of breath. Current antihyperlipidemic treatment includes statins. The current treatment  provides moderate improvement of lipids.      Review of Systems  Constitutional:  Negative for malaise/fatigue.  Eyes:  Negative for blurred vision.  Respiratory:  Negative for shortness of breath.   Gastrointestinal:  Positive for heartburn.       Objective:   Physical Exam Vitals reviewed.  Constitutional:      General: She is not in acute distress.    Appearance: She is well-developed.  HENT:     Head: Normocephalic and atraumatic.     Right Ear: Tympanic membrane normal.     Left Ear: Tympanic membrane normal.  Eyes:     Pupils: Pupils are equal, round, and reactive to light.  Neck:     Thyroid: No thyromegaly.  Cardiovascular:     Rate and Rhythm: Normal rate and regular rhythm.     Heart sounds: Normal heart sounds. No murmur heard. Pulmonary:     Effort: Pulmonary effort is normal. No respiratory distress.     Breath sounds: Normal breath sounds. No wheezing.  Abdominal:     General: Bowel sounds are normal. There is no distension.     Palpations: Abdomen is soft.     Tenderness: There is no abdominal tenderness.  Musculoskeletal:        General: No tenderness. Normal range of motion.     Cervical back: Normal range of motion and neck supple.  Skin:    General: Skin is warm and dry.  Neurological:     Mental Status: She is alert and oriented to person, place, and time.     Cranial Nerves: No cranial nerve deficit.     Deep Tendon Reflexes: Reflexes are normal and symmetric.  Psychiatric:  Behavior: Behavior normal.        Thought Content: Thought content normal.        Judgment: Judgment normal.       BP 109/60   Pulse 84   Temp (!) 97.4 F (36.3 C) (Temporal)   Ht 5' 1" (1.549 m)   Wt 141 lb 9.6 oz (64.2 kg)   SpO2 99%   BMI 26.76 kg/m      Assessment & Plan:  Erica Braun comes in today with chief complaint of Follow-up   Diagnosis and orders addressed:  1. Hypertension associated with diabetes (HCC) -  CMP14+EGFR  2. CAD in native artery - CMP14+EGFR  3. Panlobular emphysema (HCC) - CMP14+EGFR  4. OSA on CPAP - CMP14+EGFR  5. Gastroesophageal reflux disease with esophagitis without hemorrhage - CMP14+EGFR  6. Type 2 diabetes mellitus without complication, without long-term current use of insulin (HCC) - Bayer DCA Hb A1c Waived - CMP14+EGFR  7. Hyperlipidemia associated with type 2 diabetes mellitus (HCC) - CMP14+EGFR - Lipid panel   Labs pending Health Maintenance reviewed Diet and exercise encouraged  Follow up plan: 3 months    Christy Hawks, [name]    

## 2021-09-15 ENCOUNTER — Encounter: Payer: Self-pay | Admitting: Certified Registered Nurse Anesthetist

## 2021-09-18 ENCOUNTER — Other Ambulatory Visit (HOSPITAL_COMMUNITY): Payer: Self-pay | Admitting: Family

## 2021-09-18 DIAGNOSIS — Z1231 Encounter for screening mammogram for malignant neoplasm of breast: Secondary | ICD-10-CM

## 2021-09-20 ENCOUNTER — Encounter: Payer: Self-pay | Admitting: Internal Medicine

## 2021-09-20 ENCOUNTER — Ambulatory Visit (AMBULATORY_SURGERY_CENTER): Payer: Medicare HMO | Admitting: Internal Medicine

## 2021-09-20 VITALS — BP 130/83 | HR 87 | Temp 96.9°F | Resp 20 | Ht 61.0 in | Wt 146.0 lb

## 2021-09-20 DIAGNOSIS — K449 Diaphragmatic hernia without obstruction or gangrene: Secondary | ICD-10-CM | POA: Diagnosis not present

## 2021-09-20 DIAGNOSIS — R1319 Other dysphagia: Secondary | ICD-10-CM

## 2021-09-20 DIAGNOSIS — K222 Esophageal obstruction: Secondary | ICD-10-CM

## 2021-09-20 DIAGNOSIS — I2510 Atherosclerotic heart disease of native coronary artery without angina pectoris: Secondary | ICD-10-CM | POA: Diagnosis not present

## 2021-09-20 DIAGNOSIS — R1310 Dysphagia, unspecified: Secondary | ICD-10-CM | POA: Diagnosis not present

## 2021-09-20 DIAGNOSIS — Z8501 Personal history of malignant neoplasm of esophagus: Secondary | ICD-10-CM | POA: Diagnosis not present

## 2021-09-20 DIAGNOSIS — J449 Chronic obstructive pulmonary disease, unspecified: Secondary | ICD-10-CM | POA: Diagnosis not present

## 2021-09-20 MED ORDER — SODIUM CHLORIDE 0.9 % IV SOLN: 500.0000 mL | Freq: Once | INTRAVENOUS | Status: DC

## 2021-09-20 NOTE — Progress Notes (Signed)
Report given to PACU, vss 

## 2021-09-20 NOTE — Progress Notes (Signed)
1023 HR > 100 with esmolol 25 mg given IV, MD updated, vss

## 2021-09-20 NOTE — Progress Notes (Signed)
Pt's states no medical or surgical changes since previsit or office visit. 

## 2021-09-20 NOTE — Progress Notes (Signed)
GASTROENTEROLOGY PROCEDURE H&P NOTE   Primary Care Physician: Sharion Balloon, [name]    Reason for Procedure:  Treatment of post ESD esophageal stricture in setting of patient with history squamous cell esophageal cancer  Plan:    EGD with dilation  Patient is appropriate for endoscopic procedure(s) in the ambulatory ([place]) setting.  The nature of the procedure, as well as the risks, benefits, and alternatives were carefully and thoroughly reviewed with the patient. Ample time for discussion and questions allowed. The patient understood, was satisfied, and agreed to proceed.     HPI: Erica Braun is a 71 y.o. female who presents for EGD with dilation.  Medical history as below.  No recent chest pain or shortness of breath.  No abdominal pain today.  Past Medical History:  Diagnosis Date   Complication of anesthesia    COPD (chronic obstructive pulmonary disease) ([place])    Coronary artery disease    Diabetes mellitus without complication (HCC)    Diverticulosis    Early cataracts, bilateral 11/21/2016   Emphysema of lung (HCC)    Esophageal cancer (HCC)    GERD (gastroesophageal reflux disease)    Glaucoma    Hyperlipidemia    Hypertension    Internal hemorrhoids    Pneumonia    PONV (postoperative nausea and vomiting)    Sleep apnea    uses Cpap   Spasm of the cricopharyngeus muscle    Squamous cell esophageal cancer ([place]) 05/10/2018    Past Surgical History:  Procedure Laterality Date   ABDOMINAL HYSTERECTOMY     cervical dysplasia   BIOPSY  05/03/2018   Procedure: BIOPSY;  Surgeon: Irving Copas., MD;  [facility];  Service: Gastroenterology;;   BREAST BIOPSY Left    BREAST LUMPECTOMY Left    ESOPHAGOGASTRODUODENOSCOPY (EGD) WITH PROPOFOL N/A 05/03/2018   Procedure: ESOPHAGOGASTRODUODENOSCOPY (EGD) WITH PROPOFOL;  Surgeon: Irving Copas., MD;  Location: [facility] ENDOSCOPY;  Service: Gastroenterology;  Laterality: N/A;    ESOPHAGOGASTRODUODENOSCOPY (EGD) WITH PROPOFOL N/A 11/18/2018   Procedure: ESOPHAGOGASTRODUODENOSCOPY (EGD) WITH PROPOFOL;  Surgeon: Irene Shipper, MD;  Location: [facility] ENDOSCOPY;  Service: Endoscopy;  Laterality: N/A;   EUS  05/03/2018   Procedure: UPPER ENDOSCOPIC ULTRASOUND (EUS) RADIAL;  Surgeon: Irving Copas., MD;  Location: [facility] ENDOSCOPY;  Service: Gastroenterology;;   FOREIGN BODY REMOVAL  11/18/2018   Procedure: FOREIGN BODY REMOVAL;  Surgeon: Irene Shipper, MD;  Location: [facility] ENDOSCOPY;  Service: Endoscopy;;   LEFT HEART CATH AND CORONARY ANGIOGRAPHY N/A 06/11/2017   Procedure: LEFT HEART CATH AND CORONARY ANGIOGRAPHY;  Surgeon: Belva Crome, MD;  Location: [facility];  Service: Cardiovascular;  Laterality: N/A;    Prior to Admission medications   Medication Sig Start Date End Date Taking? Authorizing Provider  aspirin EC 81 MG tablet Take 81 mg by mouth daily.   Yes [provider]  blood glucose meter kit and supplies Dispense based on patient and insurance preference. Use up to four times daily as directed. (FOR ICD-10 E10.9, E11.9). 08/05/21  Yes Sharion Balloon, [name]  Blood Glucose Monitor Software DEVI Test BS BID and PRN Dx E11.9 03/31/18  Yes Rakes, Connye Burkitt, [name]  empagliflozin (JARDIANCE) 10 MG TABS tablet Take 1 tablet (10 mg total) by mouth daily before breakfast. 08/06/21  Yes Hawks, Christy A, [name]  famotidine (PEPCID) 40 MG tablet Take 1 tablet (40 mg total) by mouth at bedtime. 08/05/21  Yes Hawks, Christy A, [name]  glimepiride (AMARYL) 2  MG tablet Take 1 tablet (2 mg total) by mouth daily as needed. 08/05/21  Yes Hawks, Alyse Low A, [name]  glucose blood test strip test BS BID Dx E11.9 03/30/18  Yes Rakes, Connye Burkitt, [name]  isosorbide mononitrate (IMDUR) 30 MG 24 hr tablet Take 0.5 tablets (15 mg total) by mouth daily. 07/10/21 07/05/22 Yes Josue Hector, MD  Lancets [facility] ULTRASOFT) lancets Test BS BID Dx E11.9 03/30/18  Yes Baruch Gouty, [name]  metFORMIN  (GLUCOPHAGE) 1000 MG tablet Take 1 tablet (1,000 mg total) by mouth 2 (two) times daily with a meal. 09/03/21  Yes Hawks, Christy A, [name]  Multiple Vitamins-Minerals (MULTIVITAMIN PO) Take 1 tablet by mouth daily.   Yes [provider]  omeprazole (PRILOSEC) 40 MG capsule Take 1 capsule (40 mg total) by mouth 2 (two) times daily before a meal. 08/07/21  Yes Hawks, Christy A, [name]  rosuvastatin (CRESTOR) 10 MG tablet Take 1 tablet (10 mg total) by mouth daily. 07/10/21 07/05/22 Yes Josue Hector, MD  umeclidinium-vilanterol (ANORO ELLIPTA) 62.5-25 MCG/INH AEPB Inhale 1 puff by mouth once daily 10/16/20  Yes Chesley Mires, MD  albuterol (PROVENTIL) (2.5 MG/3ML) 0.083% nebulizer solution Take 3 mLs (2.5 mg total) by nebulization every 6 (six) hours as needed for wheezing or shortness of breath. 08/05/21   Evelina Dun A, [name]  Cholecalciferol (VITAMIN D3) 2000 units TABS Take 4,000 Units by mouth daily.    [provider]  losartan (COZAAR) 25 MG tablet Take 1 tablet by mouth once daily 08/07/21   Evelina Dun A, [name]  nitroGLYCERIN (NITROSTAT) 0.4 MG SL tablet Place 1 tablet (0.4 mg total) under the tongue every 5 (five) minutes as needed for chest pain. 01/14/19 07/10/21  Herminio Commons, MD    Current Outpatient Medications  Medication Sig Dispense Refill   aspirin EC 81 MG tablet Take 81 mg by mouth daily.     blood glucose meter kit and supplies Dispense based on patient and insurance preference. Use up to four times daily as directed. (FOR ICD-10 E10.9, E11.9). 1 each 0   Blood Glucose Monitor Software DEVI Test BS BID and PRN Dx E11.9 1 Device 1   empagliflozin (JARDIANCE) 10 MG TABS tablet Take 1 tablet (10 mg total) by mouth daily before breakfast. 90 tablet 1   famotidine (PEPCID) 40 MG tablet Take 1 tablet (40 mg total) by mouth at bedtime. 90 tablet 3   glimepiride (AMARYL) 2 MG tablet Take 1 tablet (2 mg total) by mouth daily as needed. 90 tablet 1   glucose blood test strip  test BS BID Dx E11.9 200 each 3   isosorbide mononitrate (IMDUR) 30 MG 24 hr tablet Take 0.5 tablets (15 mg total) by mouth daily. 45 tablet 3   Lancets (ONETOUCH ULTRASOFT) lancets Test BS BID Dx E11.9 200 each 3   metFORMIN (GLUCOPHAGE) 1000 MG tablet Take 1 tablet (1,000 mg total) by mouth 2 (two) times daily with a meal. 200 tablet 0   Multiple Vitamins-Minerals (MULTIVITAMIN PO) Take 1 tablet by mouth daily.     omeprazole (PRILOSEC) 40 MG capsule Take 1 capsule (40 mg total) by mouth 2 (two) times daily before a meal. 180 capsule 0   rosuvastatin (CRESTOR) 10 MG tablet Take 1 tablet (10 mg total) by mouth daily. 90 tablet 3   umeclidinium-vilanterol (ANORO ELLIPTA) 62.5-25 MCG/INH AEPB Inhale 1 puff by mouth once daily 60 each 11   albuterol (PROVENTIL) (2.5 MG/3ML) 0.083% nebulizer solution Take 3  mLs (2.5 mg total) by nebulization every 6 (six) hours as needed for wheezing or shortness of breath. 75 mL 3   Cholecalciferol (VITAMIN D3) 2000 units TABS Take 4,000 Units by mouth daily.     losartan (COZAAR) 25 MG tablet Take 1 tablet by mouth once daily 90 tablet 0   nitroGLYCERIN (NITROSTAT) 0.4 MG SL tablet Place 1 tablet (0.4 mg total) under the tongue every 5 (five) minutes as needed for chest pain. 25 tablet 3   Current Facility-Administered Medications  Medication Dose Route Frequency Provider Last Rate Last Admin   0.9 %  sodium chloride infusion  500 mL Intravenous Once Jany Buckwalter, Lajuan Lines, MD        Allergies as of 09/20/2021   (No Known Allergies)    Family History  Problem Relation Age of Onset   Cancer Mother        breast   Arthritis Mother    COPD Father 48       emphysema   Alcohol abuse Father    Diabetes Father    Cancer Sister        breast   Early death Brother        overdose   Drug abuse Brother    Alcohol abuse Paternal Aunt    COPD Paternal Aunt    Cancer Maternal Grandmother        liver cancer   Diabetes Maternal Grandmother    Alcohol abuse Maternal  Grandfather    Early death Maternal Grandfather    Stroke Paternal Grandmother    Heart disease Paternal Grandfather    Esophageal cancer Neg Hx    Stomach cancer Neg Hx    Colon cancer Neg Hx     Social History   Socioeconomic History   Marital status: Married    Spouse name: [name]   Number of children: 3   Years of education: 14   Highest education level: Some college, no degree  Occupational History   Occupation: retired    Comment: Advertising account planner  Tobacco Use   Smoking status: Former    Packs/day: 1.50    Years: 41.00    Total pack years: 61.50    Types: Cigarettes    Start date: 1969    Quit date: 2010    Years since quitting: 13.7   Smokeless tobacco: Never  Vaping Use   Vaping Use: Never used  Substance and Sexual Activity   Alcohol use: Yes    Comment: wine occasionaly   Drug use: No   Sexual activity: Yes    Birth control/protection: Post-menopausal  Other Topics Concern   Not on file  Social History Narrative   Recent move from [place] to walk   Married to [employer] dog at home   Social Determinants of [employer]: Low Risk  (12/05/2020)   Overall Financial Resource Strain (CARDIA)    Difficulty of Paying Living Expenses: Not hard at all  Food Insecurity: No Food Insecurity (12/05/2020)   Hunger Vital Sign    Worried About Running Out of Food in the Last Year: Never true    [place] in the Last Year: Never true  Transportation Needs: No Transportation Needs (12/05/2020)   PRAPARE - Hydrologist (Medical): No    Lack of Transportation (Non-Medical): No  Physical Activity: Insufficiently Active (12/05/2020)   Exercise Vital Sign    Days of Exercise per  Week: 3 days    Minutes of Exercise per Session: 30 min  Stress: No Stress Concern Present (12/05/2020)   [place]    Feeling of Stress : Not at all  Social  Connections: [place] (12/05/2020)   Social Connection and Isolation Panel [NHANES]    Frequency of Communication with Friends and Family: More than three times a week    Frequency of Social Gatherings with Friends and Family: More than three times a week    Attends Religious Services: More than 4 times per year    Active Member of [employer] or Organizations: Yes    Attends Music therapist: More than 4 times per year    Marital Status: Married  Human resources officer Violence: Not At Risk (12/05/2020)   Humiliation, Afraid, Rape, and Kick questionnaire    Fear of Current or Ex-Partner: No    Emotionally Abused: No    Physically Abused: No    Sexually Abused: No    Physical Exam: Vital signs in last 24 hours: _0  112/68   Pulse 81   Temp (!) 96.9 F (36.1 C) (Temporal)   Ht _1  (1.549 m)   Wt 146 lb (66.2 kg)   SpO2 100%   BMI 27.59 kg/m  GEN: NAD EYE: Sclerae anicteric ENT: MMM CV: Non-tachycardic Pulm: CTA b/l GI: Soft, NT/ND NEURO:  Alert & Oriented x 3   Zenovia Jarred, MD [facility]  09/20/2021 9:55 AM

## 2021-09-20 NOTE — Patient Instructions (Signed)
Please read handouts provided. Continue present medications. Follow Dilation Diet. Repeat upper endoscopy as needed.   YOU HAD AN ENDOSCOPIC PROCEDURE TODAY AT [facility]:   Refer to the procedure report that was given to you for any specific questions about what was found during the examination.  If the procedure report does not answer your questions, please call your gastroenterologist to clarify.  If you requested that your care partner not be given the details of your procedure findings, then the procedure report has been included in a sealed envelope for you to review at your convenience later.  YOU SHOULD EXPECT: Some feelings of bloating in the abdomen. Passage of more gas than usual.  Walking can help get rid of the air that was put into your GI tract during the procedure and reduce the bloating. If you had a lower endoscopy (such as a colonoscopy or flexible sigmoidoscopy) you may notice spotting of blood in your stool or on the toilet paper. If you underwent a bowel prep for your procedure, you may not have a normal bowel movement for a few days.  Please Note:  You might notice some irritation and congestion in your nose or some drainage.  This is from the oxygen used during your procedure.  There is no need for concern and it should clear up in a day or so.  SYMPTOMS TO REPORT IMMEDIATELY:  Following upper endoscopy (EGD)  Vomiting of blood or coffee ground material  New chest pain or pain under the shoulder blades  Painful or persistently difficult swallowing  New shortness of breath  Fever of 100F or higher  Black, tarry-looking stools  For urgent or emergent issues, a gastroenterologist can be reached at any hour by calling [phone]. Do not use MyChart messaging for urgent concerns.    DIET:   Drink plenty of fluids but you should avoid alcoholic beverages for 24 hours.  ACTIVITY:  You should plan to take it easy for the rest of today and you should  NOT DRIVE or use heavy machinery until tomorrow (because of the sedation medicines used during the test).    FOLLOW UP: Our staff will call the number listed on your records the next business day following your procedure.  We will call around 7:15- 8:00 am to check on you and address any questions or concerns that you may have regarding the information given to you following your procedure. If we do not reach you, we will leave a message.     If any biopsies were taken you will be contacted by phone or by letter within the next 1-3 weeks.  Please call us at [phone] if you have not heard about the biopsies in 3 weeks.    SIGNATURES/CONFIDENTIALITY: You and/or your care partner have signed paperwork which will be entered into your electronic medical record.  These signatures attest to the fact that that the information above on your After Visit Summary has been reviewed and is understood.  Full responsibility of the confidentiality of this discharge information lies with you and/or your care-partner.

## 2021-09-20 NOTE — Op Note (Signed)
[facility] Patient Name: Erica Braun Procedure Date: 09/20/2021 9:57 AM MRN: [id number] Endoscopist: Jerene Bears , MD Age: 71 Referring MD:  Date of Birth: 1950/10/02 Gender: Female Account #: [id number] Procedure:                Upper GI endoscopy Indications:              Dysphagia, For therapy of esophageal stenosis after                            ESD for sq cell esophageal cancer, last EGD 08/12/21                            with dilation by endoscope only Medicines:                Monitored Anesthesia Care Procedure:                Pre-Anesthesia Assessment:                           - Prior to the procedure, a History and Physical                            was performed, and patient medications and                            allergies were reviewed. The patient's tolerance of                            previous anesthesia was also reviewed. The risks                            and benefits of the procedure and the sedation                            options and risks were discussed with the patient.                            All questions were answered, and informed consent                            was obtained. Prior Anticoagulants: The patient has                            taken no previous anticoagulant or antiplatelet                            agents. ASA Grade Assessment: III - A patient with                            severe systemic disease. After reviewing the risks                            and benefits, the patient was deemed in  satisfactory condition to undergo the procedure.                           After obtaining informed consent, the endoscope was                            passed under direct vision. Throughout the                            procedure, the patient's blood pressure, pulse, and                            oxygen saturations were monitored continuously. The                            Endoscope was  introduced through the mouth, and                            advanced to the second part of duodenum. The upper                            GI endoscopy was accomplished without difficulty.                            The patient tolerated the procedure well. Scope In: Scope Out: Findings:                 One benign-appearing, intrinsic moderate                            (circumferential scarring or stenosis; an endoscope                            may pass) stenosis was found 27 cm from the                            incisors. This stenosis measured 9 mm (inner                            diameter) x 1 cm (in length). The stenosis was                            traversed. A TTS dilator was passed through the                            scope. Dilation with a 13.5-14.5-15.5 mm balloon                            dilator was performed to 14.5 mm. The dilation site                            was examined and showed moderate mucosal disruption  and moderate improvement in luminal narrowing.                           The exam of the esophagus was otherwise normal.                           A 1 cm hiatal hernia was present.                           The entire examined stomach was normal.                           The examined duodenum was normal. Complications:            No immediate complications. Estimated Blood Loss:     Estimated blood loss was minimal. Impression:               - Benign-appearing esophageal stenosis. Dilated to                            14.5 mm with balloon.                           - 1 cm hiatal hernia.                           - Normal stomach.                           - Normal examined duodenum.                           - No specimens collected. Recommendation:           - Patient has a contact number available for                            emergencies. The signs and symptoms of potential                            delayed complications  were discussed with the                            patient. Return to normal activities tomorrow.                            Written discharge instructions were provided to the                            patient.                           - Post-dilation protocol and then advance diet as                            tolerated.                           -  Continue present medications.                           - Repeat upper endoscopy as needed for retreatment. Jerene Bears, MD 09/20/2021 10:27:51 AM This report has been signed electronically.

## 2021-09-20 NOTE — Progress Notes (Signed)
1004 Robinul 0.1 mg IV given due large amount of secretions upon assessment.  MD made aware, vss 

## 2021-09-23 ENCOUNTER — Telehealth: Payer: Self-pay

## 2021-09-23 NOTE — Telephone Encounter (Signed)
  Follow up Call-     09/20/2021    9:13 AM 08/12/2021   11:08 AM  Call back number  Post procedure Call Back phone  # [phone]  Permission to leave phone message Yes Yes     Patient questions:  Do you have a fever, pain , or abdominal swelling? No. Pain Score  0 *  Have you tolerated food without any problems? Yes.    Have you been able to return to your normal activities? Yes.    Do you have any questions about your discharge instructions: Diet   No. Medications  No. Follow up visit  No.  Do you have questions or concerns about your Care? No.  Actions: * If pain score is 4 or above: No action needed, pain <4.

## 2021-10-02 DIAGNOSIS — Z23 Encounter for immunization: Secondary | ICD-10-CM | POA: Diagnosis not present

## 2021-10-02 DIAGNOSIS — S41102A Unspecified open wound of left upper arm, initial encounter: Secondary | ICD-10-CM | POA: Diagnosis not present

## 2021-10-07 DIAGNOSIS — H52223 Regular astigmatism, bilateral: Secondary | ICD-10-CM | POA: Diagnosis not present

## 2021-10-07 DIAGNOSIS — H524 Presbyopia: Secondary | ICD-10-CM | POA: Diagnosis not present

## 2021-10-07 DIAGNOSIS — H43813 Vitreous degeneration, bilateral: Secondary | ICD-10-CM | POA: Diagnosis not present

## 2021-10-07 DIAGNOSIS — H2513 Age-related nuclear cataract, bilateral: Secondary | ICD-10-CM | POA: Diagnosis not present

## 2021-10-07 DIAGNOSIS — H5203 Hypermetropia, bilateral: Secondary | ICD-10-CM | POA: Diagnosis not present

## 2021-10-11 ENCOUNTER — Telehealth: Payer: Self-pay | Admitting: Family

## 2021-10-11 MED ORDER — GLIPIZIDE ER 2.5 MG PO TB24: 2.5000 mg | ORAL_TABLET | Freq: Every day | ORAL | 1 refills | Status: DC

## 2021-10-11 NOTE — Addendum Note (Signed)
Addended by: Evelina Dun A on: 10/11/2021 03:05 PM   Modules accepted: Orders

## 2021-10-11 NOTE — Telephone Encounter (Signed)
Fax was rcvd asking to change med from Glimepiride 3 mg d/t high risk for pt's 65 & older, this was check ok to change to Glipizide ER. I faxed this back New RX for Glipizide ER needs directions, quantity and refills if any. This can be sent to pt's pharmacy, [facility] in [place]

## 2021-10-11 NOTE — Telephone Encounter (Signed)
Prescription sent to pharmacy.

## 2021-10-28 ENCOUNTER — Ambulatory Visit (HOSPITAL_COMMUNITY): Payer: Medicare HMO

## 2021-11-06 ENCOUNTER — Ambulatory Visit (HOSPITAL_COMMUNITY)
Admission: RE | Admit: 2021-11-06 | Discharge: 2021-11-06 | Disposition: A | Discharge status: Home / Self Care | Payer: Medicare HMO | Source: Ambulatory Visit | Attending: Family | Admitting: Family

## 2021-11-06 DIAGNOSIS — Z1231 Encounter for screening mammogram for malignant neoplasm of breast: Secondary | ICD-10-CM | POA: Insufficient documentation

## 2021-11-11 ENCOUNTER — Other Ambulatory Visit (HOSPITAL_COMMUNITY): Payer: Self-pay | Admitting: Family

## 2021-11-11 DIAGNOSIS — R928 Other abnormal and inconclusive findings on diagnostic imaging of breast: Secondary | ICD-10-CM

## 2021-11-13 ENCOUNTER — Ambulatory Visit (HOSPITAL_COMMUNITY)
Admission: RE | Admit: 2021-11-13 | Discharge: 2021-11-13 | Disposition: A | Discharge status: Home / Self Care | Payer: Medicare HMO | Source: Ambulatory Visit | Attending: Family | Admitting: Family

## 2021-11-13 DIAGNOSIS — R921 Mammographic calcification found on diagnostic imaging of breast: Secondary | ICD-10-CM | POA: Diagnosis not present

## 2021-11-13 DIAGNOSIS — R928 Other abnormal and inconclusive findings on diagnostic imaging of breast: Secondary | ICD-10-CM | POA: Diagnosis not present

## 2021-11-13 DIAGNOSIS — Z803 Family history of malignant neoplasm of breast: Secondary | ICD-10-CM | POA: Diagnosis not present

## 2021-11-14 ENCOUNTER — Encounter: Payer: Self-pay | Admitting: Family

## 2021-11-14 ENCOUNTER — Other Ambulatory Visit (HOSPITAL_COMMUNITY): Payer: Self-pay | Admitting: Family

## 2021-11-14 ENCOUNTER — Other Ambulatory Visit: Payer: Self-pay | Admitting: Family

## 2021-11-14 DIAGNOSIS — R921 Mammographic calcification found on diagnostic imaging of breast: Secondary | ICD-10-CM

## 2021-11-14 DIAGNOSIS — N6310 Unspecified lump in the right breast, unspecified quadrant: Secondary | ICD-10-CM

## 2021-11-14 DIAGNOSIS — R928 Other abnormal and inconclusive findings on diagnostic imaging of breast: Secondary | ICD-10-CM

## 2021-11-17 ENCOUNTER — Encounter (INDEPENDENT_AMBULATORY_CARE_PROVIDER_SITE_OTHER): Payer: Self-pay | Admitting: Gastroenterology

## 2021-11-20 ENCOUNTER — Encounter: Payer: Self-pay | Admitting: Pulmonary Disease

## 2021-11-20 ENCOUNTER — Ambulatory Visit: Payer: Medicare HMO | Admitting: Pulmonary Disease

## 2021-11-20 VITALS — BP 128/78 | HR 94 | Temp 98.8°F | Ht 61.0 in | Wt 134.8 lb

## 2021-11-20 DIAGNOSIS — J432 Centrilobular emphysema: Secondary | ICD-10-CM | POA: Diagnosis not present

## 2021-11-20 DIAGNOSIS — G4733 Obstructive sleep apnea (adult) (pediatric): Secondary | ICD-10-CM | POA: Diagnosis not present

## 2021-11-20 DIAGNOSIS — Z23 Encounter for immunization: Secondary | ICD-10-CM

## 2021-11-20 MED ORDER — FLUTICASONE-SALMETEROL 250-50 MCG/ACT IN AEPB: 1.0000 | INHALATION_SPRAY | Freq: Two times a day (BID) | RESPIRATORY_TRACT | 11 refills | Status: DC

## 2021-11-20 NOTE — Patient Instructions (Signed)
Advair 1 puff twice per day, and rinse your mouth after each use  Stop anoro once you start advair  Will have your CPAP changed to 9 cm water pressure   High dose flu shot today  Follow up in 1 year

## 2021-11-20 NOTE — Progress Notes (Signed)
[facility]  Chief Complaint  Patient presents with   Follow-up    Cpap working well, has some issues with mask  Wants to discuss changing to advair inhaler from anoro for cost reasons  Would like flu shot today     Past Surgical History:  She  has a past surgical history that includes Abdominal hysterectomy; Breast biopsy (Left); LEFT HEART CATH AND CORONARY ANGIOGRAPHY (N/A, 06/11/2017); Breast lumpectomy (Left); Esophagogastroduodenoscopy (egd) with propofol (N/A, 05/03/2018); biopsy (05/03/2018); EUS (05/03/2018); Esophagogastroduodenoscopy (egd) with propofol (N/A, 11/18/2018); and Foreign Body Removal (11/18/2018).  Past Medical History:  CAD, DM type 2, Esophageal cancer, Diverticulosis, Esophageal stricture, Cataracts, GERD, HLD, HTN, PNA, COVID 25 November 2018  Constitutional:  BP 128/78   Pulse 94   Temp 98.8 F (37.1 C) (Oral)   Ht _0  (1.549 m)   Wt 134 lb 12.8 oz (61.1 kg)   SpO2 97% Comment: ra  BMI 25.47 kg/m   Brief Summary:  Erica Braun is a 71 y.o. female former smoker with COPD and obstructive sleep apnea.       Subjective:   Uses CPAP nightly.  Full face mask seems to work best.  Pressure is okay, but she gets very dry at night.  She tried turning up humidifier, but then get rain out.  Breathing okay.  Main problem is during Summer with humidity and pollen.  She keeps busy working around her house.  Not having cough, wheeze, or sputum.  Anoro will be too expensive since some of her other medications have pushed her into the donut hole quicker.  Physical Exam:   Appearance - well kempt   ENMT - no sinus tenderness, no oral exudate, no LAN, Mallampati 3 airway, no stridor  Respiratory - equal breath sounds bilaterally, no wheezing or rales  CV - s1s2 regular rate and rhythm, no murmurs  Ext - no clubbing, no edema  Skin - no rashes  Psych - normal mood and affect     Pulmonary testing:   Spirometry 03/02/15 >> FEV1 1.23 (57%), FEV1% 50 PFT 12/11/17 >> FEV1 0.98 (47%), FEV1% 60, TLC 108%, DLCO 59% IgE 12/11/17 >> 91 A1AT 12/13/17 >> 179, MM  Chest Imaging:  CT chest 04/23/18 >> mild centrilobular emphysema, scarring in lingular and RML  Sleep Tests:  PSG 11/15/17 >> AHI 11.1, SpO2 low 74% CPAP 10/20/21 to 11/18/21 >> used on 30 of 30 nights with average 6 hrs 35 min.  Average AHI 0.4 with CPAP 11 cm H2O  Cardiac Tests:  LHC 06/11/17 >> EF 55%, nl LVEDP, patent Lt main and circumflex stent, chronic occlusion of mid RCA  Social History:  She  reports that she quit smoking about 13 years ago. Her smoking use included cigarettes. She started smoking about 54 years ago. She has a 61.50 pack-year smoking history. She has never used smokeless tobacco. She reports current alcohol use. She reports that she does not use drugs.  Family History:  Her family history includes Alcohol abuse in her father, maternal grandfather, and paternal aunt; Arthritis in her mother; COPD in her paternal aunt; COPD (age of onset: 65) in her father; Cancer in her maternal grandmother, mother, and sister; Diabetes in her father and maternal grandmother; Drug abuse in her brother; Early death in her brother and maternal grandfather; Heart disease in her paternal grandfather; Stroke in her paternal grandmother.      Assessment/Plan:   COPD with emphysema. - will need to  change from anoro to advair due to better insurance coverage; explained this is not a direct switch, and symptoms to monitor for  - prn albuterol  Obstructive sleep apnea. - she is compliant with CPAP and reports benefit from therapy - uses [employer] for her DME - current CPAP ordered December 2019 - discussed options to prevent rain out - will decrease CPAP to 9 cm H2O to see if this improves mouth dryness  Squamous cell carcinoma of esophagus. - Stage 1 - previously seen by Dr. Burr Medico with oncology and Dr. Zenovia Jarred with GI  CAD  s/p stent. - followed by Dr. Jenkins Rouge with cardiology  Time Spent Involved in Patient Care on Day of Examination:  27 minutes  Follow up:   Patient Instructions  Advair 1 puff twice per day, and rinse your mouth after each use  Stop anoro once you start advair  Will have your CPAP changed to 9 cm water pressure   High dose flu shot today  Follow up in 1 year  Medication List:   Allergies as of 11/20/2021   No Known Allergies      Medication List        Accurate as of November 20, 2021 11:47 AM. If you have any questions, ask your nurse or doctor.          STOP taking these medications    Anoro Ellipta 62.5-25 MCG/ACT Aepb Generic drug: umeclidinium-vilanterol Stopped by: Chesley Mires, MD   Vitamin D3 50 MCG (2000 UT) Tabs Stopped by: Chesley Mires, MD       TAKE these medications    albuterol (2.5 MG/3ML) 0.083% nebulizer solution Commonly known as: PROVENTIL Take 3 mLs (2.5 mg total) by nebulization every 6 (six) hours as needed for wheezing or shortness of breath.   aspirin EC 81 MG tablet Take 81 mg by mouth daily.   blood glucose meter kit and supplies Dispense based on patient and insurance preference. Use up to four times daily as directed. (FOR ICD-10 E10.9, E11.9).   Blood Glucose Monitor Software Devi Test BS BID and PRN Dx E11.9   empagliflozin 10 MG Tabs tablet Commonly known as: Jardiance Take 1 tablet (10 mg total) by mouth daily before breakfast.   famotidine 40 MG tablet Commonly known as: PEPCID Take 1 tablet (40 mg total) by mouth at bedtime.   fluticasone-salmeterol 250-50 MCG/ACT Aepb Commonly known as: ADVAIR Inhale 1 puff into the lungs every 12 (twelve) hours. Started by: Chesley Mires, MD   glipiZIDE 2.5 MG 24 hr tablet Commonly known as: GLUCOTROL XL Take 1 tablet (2.5 mg total) by mouth daily with breakfast.   glucose blood test strip test BS BID Dx E11.9   isosorbide mononitrate 30 MG 24 hr tablet Commonly  known as: IMDUR Take 0.5 tablets (15 mg total) by mouth daily.   losartan 25 MG tablet Commonly known as: COZAAR Take 1 tablet by mouth once daily   metFORMIN 1000 MG tablet Commonly known as: GLUCOPHAGE Take 1 tablet (1,000 mg total) by mouth 2 (two) times daily with a meal.   MULTIVITAMIN PO Take 1 tablet by mouth daily.   nitroGLYCERIN 0.4 MG SL tablet Commonly known as: Nitrostat Place 1 tablet (0.4 mg total) under the tongue every 5 (five) minutes as needed for chest pain.   omeprazole 40 MG capsule Commonly known as: PRILOSEC Take 1 capsule (40 mg total) by mouth 2 (two) times daily before a meal.   onetouch ultrasoft lancets Test BS  BID Dx E11.9   rosuvastatin 10 MG tablet Commonly known as: CRESTOR Take 1 tablet (10 mg total) by mouth daily.        Signature:  Chesley Mires, MD [place] Pager - [phone] 11/20/2021, 11:47 AM

## 2021-11-25 ENCOUNTER — Telehealth: Payer: Self-pay | Admitting: Pulmonary Disease

## 2021-11-25 DIAGNOSIS — G4733 Obstructive sleep apnea (adult) (pediatric): Secondary | ICD-10-CM

## 2021-11-25 NOTE — Telephone Encounter (Signed)
Pt states pressures being decreased is causing headaches and requesting pressures to be increased to where they were.  Please advise.  [facility]   Dr. Halford Chessman please advise

## 2021-11-26 NOTE — Telephone Encounter (Signed)
Please send order to have her CPAP increased back to 11 cm H2O.

## 2021-11-26 NOTE — Telephone Encounter (Signed)
Order placed. Nothing further needed. 

## 2021-11-27 ENCOUNTER — Ambulatory Visit
Admission: RE | Admit: 2021-11-27 | Discharge: 2021-11-27 | Disposition: A | Discharge status: Home / Self Care | Payer: Medicare HMO | Source: Ambulatory Visit | Attending: Family | Admitting: Family

## 2021-11-27 DIAGNOSIS — D241 Benign neoplasm of right breast: Secondary | ICD-10-CM | POA: Diagnosis not present

## 2021-11-27 DIAGNOSIS — N6310 Unspecified lump in the right breast, unspecified quadrant: Secondary | ICD-10-CM

## 2021-11-27 DIAGNOSIS — R921 Mammographic calcification found on diagnostic imaging of breast: Secondary | ICD-10-CM

## 2021-11-27 DIAGNOSIS — N6341 Unspecified lump in right breast, subareolar: Secondary | ICD-10-CM | POA: Diagnosis not present

## 2021-11-27 DIAGNOSIS — R928 Other abnormal and inconclusive findings on diagnostic imaging of breast: Secondary | ICD-10-CM

## 2021-11-27 HISTORY — PX: BREAST BIOPSY: SHX20

## 2021-12-09 ENCOUNTER — Ambulatory Visit (INDEPENDENT_AMBULATORY_CARE_PROVIDER_SITE_OTHER): Payer: Medicare HMO

## 2021-12-09 VITALS — Ht 61.0 in | Wt 134.0 lb

## 2021-12-09 DIAGNOSIS — Z0000 Encounter for general adult medical examination without abnormal findings: Secondary | ICD-10-CM | POA: Diagnosis not present

## 2021-12-09 NOTE — Progress Notes (Signed)
Subjective:   Francys Carisa Backhaus is a 71 y.o. female who presents for Medicare Annual (Subsequent) preventive examination.  I connected with  Almas Monta Vanmetre on 12/09/21 by a audio enabled telemedicine application and verified that I am speaking with the correct person using two identifiers.  Patient Location: Home  Provider Location: Home Office  I discussed the limitations of evaluation and management by telemedicine. The patient expressed understanding and agreed to proceed.  Review of Systems     Cardiac Risk Factors include: advanced age (>65mn, >>69women);diabetes mellitus;dyslipidemia;hypertension     Objective:    Today's Vitals   12/09/21 1601  Weight: 134 lb (60.8 kg)  Height: _0  (1.549 m)   Body mass index is 25.32 kg/m.     12/09/2021    3:33 PM 12/05/2020    3:37 PM 05/11/2018   10:24 AM 05/03/2018    6:53 AM 04/08/2018    7:35 AM 06/11/2017    9:29 AM  Advanced Directives  Does Patient Have a Medical Advance Directive? _1  No  Would patient like information on creating a medical advance directive? Yes (MAU/Ambulatory/Procedural Areas - Information given) No - Patient declined No - Patient declined No - Patient declined No - Guardian declined Yes (MAU/Ambulatory/Procedural Areas - Information given)    Current Medications (verified) Outpatient Encounter Medications as of 12/09/2021  Medication Sig   albuterol (PROVENTIL) (2.5 MG/3ML) 0.083% nebulizer solution Take 3 mLs (2.5 mg total) by nebulization every 6 (six) hours as needed for wheezing or shortness of breath.   aspirin EC 81 MG tablet Take 81 mg by mouth daily.   Blood Glucose Monitor Software DEVI Test BS BID and PRN Dx E11.9   empagliflozin (JARDIANCE) 10 MG TABS tablet Take 1 tablet (10 mg total) by mouth daily before breakfast.   famotidine (PEPCID) 40 MG tablet Take 1 tablet (40 mg total) by mouth at bedtime.   fluticasone-salmeterol (ADVAIR) 250-50 MCG/ACT AEPB Inhale 1  puff into the lungs every 12 (twelve) hours.   glipiZIDE (GLUCOTROL XL) 2.5 MG 24 hr tablet Take 1 tablet (2.5 mg total) by mouth daily with breakfast.   glucose blood test strip test BS BID Dx E11.9   isosorbide mononitrate (IMDUR) 30 MG 24 hr tablet Take 0.5 tablets (15 mg total) by mouth daily.   losartan (COZAAR) 25 MG tablet Take 1 tablet by mouth once daily   metFORMIN (GLUCOPHAGE) 1000 MG tablet Take 1 tablet (1,000 mg total) by mouth 2 (two) times daily with a meal.   Multiple Vitamins-Minerals (MULTIVITAMIN PO) Take 1 tablet by mouth daily.   omeprazole (PRILOSEC) 40 MG capsule Take 1 capsule (40 mg total) by mouth 2 (two) times daily before a meal.   rosuvastatin (CRESTOR) 10 MG tablet Take 1 tablet (10 mg total) by mouth daily.   Lancets (ONETOUCH ULTRASOFT) lancets Test BS BID Dx E11.9   nitroGLYCERIN (NITROSTAT) 0.4 MG SL tablet Place 1 tablet (0.4 mg total) under the tongue every 5 (five) minutes as needed for chest pain.   [DISCONTINUED] blood glucose meter kit and supplies Dispense based on patient and insurance preference. Use up to four times daily as directed. (FOR ICD-10 E10.9, E11.9).   No facility-administered encounter medications on file as of 12/09/2021.    Allergies (verified) Patient has no known allergies.   History: Past Medical History:  Diagnosis Date   Complication of anesthesia    COPD (chronic obstructive pulmonary disease) ([place]    Coronary artery  disease    Diabetes mellitus without complication ([place])    Diverticulosis    Early cataracts, bilateral 11/21/2016   Emphysema of lung (HCC)    Esophageal cancer (HCC)    GERD (gastroesophageal reflux disease)    Glaucoma    Hyperlipidemia    Hypertension    Internal hemorrhoids    Pneumonia    PONV (postoperative nausea and vomiting)    Sleep apnea    uses Cpap   Spasm of the cricopharyngeus muscle    Squamous cell esophageal cancer ([place]) 05/10/2018   Past Surgical History:  Procedure Laterality  Date   ABDOMINAL HYSTERECTOMY     cervical dysplasia   BIOPSY  05/03/2018   Procedure: BIOPSY;  Surgeon: Irving Copas., MD;  Location: [facility] ENDOSCOPY;  Service: Gastroenterology;;   BREAST BIOPSY Left    BREAST BIOPSY Right 11/27/2021   MM RT BREAST BX W LOC DEV 1ST LESION IMAGE BX SPEC STEREO GUIDE 11/27/2021 [facility] MAMMOGRAPHY   BREAST LUMPECTOMY Left    ESOPHAGOGASTRODUODENOSCOPY (EGD) WITH PROPOFOL N/A 05/03/2018   Procedure: ESOPHAGOGASTRODUODENOSCOPY (EGD) WITH PROPOFOL;  Surgeon: Irving Copas., MD;  Location: [place];  Service: Gastroenterology;  Laterality: N/A;   ESOPHAGOGASTRODUODENOSCOPY (EGD) WITH PROPOFOL N/A 11/18/2018   Procedure: ESOPHAGOGASTRODUODENOSCOPY (EGD) WITH PROPOFOL;  Surgeon: Irene Shipper, MD;  Location: [facility] ENDOSCOPY;  Service: Endoscopy;  Laterality: N/A;   EUS  05/03/2018   Procedure: UPPER ENDOSCOPIC ULTRASOUND (EUS) RADIAL;  Surgeon: Irving Copas., MD;  Location: [facility] ENDOSCOPY;  Service: Gastroenterology;;   FOREIGN BODY REMOVAL  11/18/2018   Procedure: FOREIGN BODY REMOVAL;  Surgeon: Irene Shipper, MD;  Location: [facility] ENDOSCOPY;  Service: Endoscopy;;   LEFT HEART CATH AND CORONARY ANGIOGRAPHY N/A 06/11/2017   Procedure: LEFT HEART CATH AND CORONARY ANGIOGRAPHY;  Surgeon: Belva Crome, MD;  Location: [place] LAB;  Service: Cardiovascular;  Laterality: N/A;   Family History  Problem Relation Age of Onset   Cancer Mother        breast   Arthritis Mother    COPD Father 30       emphysema   Alcohol abuse Father    Diabetes Father    Cancer Sister        breast   Early death Brother        overdose   Drug abuse Brother    Alcohol abuse Paternal Aunt    COPD Paternal Aunt    Cancer Maternal Grandmother        liver cancer   Diabetes Maternal Grandmother    Alcohol abuse Maternal Grandfather    Early death Maternal Grandfather    Stroke Paternal Grandmother    Heart disease Paternal Grandfather    Esophageal cancer  Neg Hx    Stomach cancer Neg Hx    Colon cancer Neg Hx    Social History   Socioeconomic History   Marital status: Married    Spouse name: [name]   Number of children: 3   Years of education: 14   Highest education level: Some college, no degree  Occupational History   Occupation: retired    Comment: Advertising account planner  Tobacco Use   Smoking status: Former    Packs/day: 1.50    Years: 41.00    Total pack years: 61.50    Types: Cigarettes    Start date: 1969    Quit date: 2010    Years since quitting: 13.9   Smokeless tobacco: Never  Vaping Use   Vaping  Use: Never used  Substance and Sexual Activity   Alcohol use: Yes    Comment: wine occasionaly   Drug use: No   Sexual activity: Yes    Birth control/protection: Post-menopausal  Other Topics Concern   Not on file  Social History Narrative   Recent move from [place] to walk   Married to [employer] dog at home   Social Determinants of [employer]: Low Risk  (12/09/2021)   Overall Financial Resource Strain (CARDIA)    Difficulty of Paying Living Expenses: Not hard at all  Food Insecurity: No Food Insecurity (12/09/2021)   Hunger Vital Sign    Worried About Running Out of Food in the Last Year: Never true    [place] in the Last Year: Never true  Transportation Needs: No Transportation Needs (12/09/2021)   PRAPARE - Hydrologist (Medical): No    Lack of Transportation (Non-Medical): No  Physical Activity: Insufficiently Active (12/09/2021)   Exercise Vital Sign    Days of Exercise per Week: 3 days    Minutes of Exercise per Session: 30 min  Stress: No Stress Concern Present (12/09/2021)   [place]    Feeling of Stress : Only a little  Social Connections: Socially Integrated (12/09/2021)   Social Connection and Isolation Panel [NHANES]    Frequency of Communication with Friends and Family:  More than three times a week    Frequency of Social Gatherings with Friends and Family: More than three times a week    Attends Religious Services: More than 4 times per year    Active Member of [employer] or Organizations: Yes    Attends Music therapist: More than 4 times per year    Marital Status: Married    Tobacco Counseling Counseling given: Not Answered   Clinical Intake:  Pre-visit preparation completed: Yes  Pain : No/denies pain        How often do you need to have someone help you when you read instructions, pamphlets, or other written materials from your doctor or pharmacy?: 1 - Never  Diabetic?Yes  Nutrition Risk Assessment:  Has the patient had any N/V/D within the last 2 months?  No  Does the patient have any non-healing wounds?  No  Has the patient had any unintentional weight loss or weight gain?  No   Diabetes:  Is the patient diabetic?  Yes  If diabetic, was a CBG obtained today?  No  Did the patient bring in their glucometer from home?  No  How often do you monitor your CBG's? As directed .   Financial Strains and Diabetes Management:  Are you having any financial strains with the device, your supplies or your medication? No .  Does the patient want to be seen by Chronic Care Management for management of their diabetes?  No  Would the patient like to be referred to a Nutritionist or for Diabetic Management?  No   Diabetic Exams:  Diabetic Eye Exam: Completed with [facility]; will request records  Diabetic Foot Exam: Completed at next office visit     Interpreter Needed?: No  Information entered by :: Denman George LPN   Activities of Daily Living    12/09/2021    4:06 PM 12/05/2021    9:57 AM  In your present state of health, do you have any difficulty performing the  following activities:  Hearing? 0 0  Vision? 0 0  Difficulty concentrating or making decisions? 0 0  Walking or climbing stairs? 0 0  Dressing or bathing? 0  0  Doing errands, shopping? 0 0  Preparing Food and eating ? N N  Using the Toilet? N N  In the past six months, have you accidently leaked urine? N N  Do you have problems with loss of bowel control? N N  Managing your Medications? N N  Managing your Finances? N N  Housekeeping or managing your Housekeeping? N N    Patient Care Team: Sharion Balloon, [name] as PCP - General (Family Medicine) Herminio Commons, MD (Inactive) as PCP - Cardiology (Cardiology) Pyrtle, Lajuan Lines, MD as Consulting Physician (Gastroenterology) Chesley Mires, MD as Consulting Physician (Pulmonary Disease)  Indicate any recent Medical Services you may have received from other than [facility] providers in the past year (date may be approximate).     Assessment:   This is a routine wellness examination for Georgina.  Hearing/Vision screen Hearing Screening - Comments:: No concerns  Vision Screening - Comments:: Up to date with routine eye exams with [facility]     Dietary issues and exercise activities discussed: Current Exercise Habits: Home exercise routine, Type of exercise: walking, Time (Minutes): 30, Frequency (Times/Week): 3, Weekly Exercise (Minutes/Week): 90, Intensity: Mild   Goals Addressed             This Visit's Progress    Exercise 3x per week (30 min per time)   On track    Increase walking.        Depression Screen    12/09/2021    3:36 PM 08/05/2021    9:22 AM 07/17/2021    9:02 AM 12/05/2020    3:34 PM 09/01/2019    2:21 PM 03/21/2019    2:10 PM 03/18/2019   11:12 AM  PHQ 2/9 Scores  PHQ - 2 Score 0 0 0 0 0 0 0  PHQ- 9 Score  1 2        Fall Risk    12/09/2021    4:02 PM 12/05/2021    9:57 AM 09/10/2021   10:29 AM 07/17/2021    9:02 AM 12/05/2020    3:38 PM  Fall Risk   Falls in the past year? _0 0  Number falls in past yr: 0 0 0 0 0  Injury with Fall? 0 0 0 1 0  Risk for fall due to : History of fall(s)    No Fall Risks  Follow up Education provided;Falls  prevention discussed;Falls evaluation completed  Falls evaluation completed Falls prevention discussed Falls prevention discussed    FALL RISK PREVENTION PERTAINING TO THE HOME:  Any stairs in or around the home? Yes  If so, are there any without handrails? No  Home free of loose throw rugs in walkways, pet beds, electrical cords, etc? Yes  Adequate lighting in your home to reduce risk of falls? Yes   ASSISTIVE DEVICES UTILIZED TO PREVENT FALLS:  Life alert? No  Use of a cane, walker or w/c? No  Grab bars in the bathroom? Yes  Shower chair or bench in shower? No  Elevated toilet seat or a handicapped toilet? No   TIMED UP AND GO:  Was the test performed? No .  Length of time to ambulate 10 feet: telephonic visit  Cognitive Function:        12/09/2021    4:06 PM 12/05/2020  3:42 PM 03/18/2019   11:14 AM  6CIT Screen  What Year? 0 points 0 points 0 points  What month? 0 points 0 points 0 points  What time? 0 points 0 points 0 points  Count back from 20 0 points 0 points 0 points  Months in reverse 0 points 0 points 0 points  Repeat phrase 0 points 0 points 0 points  Total Score 0 points 0 points 0 points    Immunizations Immunization History  Administered Date(s) Administered   Fluad Quad(high Dose 65+) 10/20/2019, 10/25/2020, 11/20/2021   Influenza, High Dose Seasonal PF 11/01/2017, 10/07/2018, 11/08/2018, 11/08/2018   Influenza,inj,Quad PF,6+ Mos 11/21/2016   Influenza-Unspecified 11/08/2018   Moderna Sars-Covid-2 Vaccination 04/07/2019, 05/10/2019   Pneumococcal Conjugate-13 09/01/2019   Pneumococcal Polysaccharide-23 12/11/2017    TDAP status: Due, Education has been provided regarding the importance of this vaccine. Advised may receive this vaccine at local pharmacy or Health Dept. Aware to provide a copy of the vaccination record if obtained from local pharmacy or Health Dept. Verbalized acceptance and understanding.  Flu Vaccine status: Up to  date  Pneumococcal vaccine status: Up to date  Covid-19 vaccine status: Information provided on how to obtain vaccines.   Qualifies for Shingles Vaccine? Yes   Zostavax completed No   Shingrix Completed?: No.    Education has been provided regarding the importance of this vaccine. Patient has been advised to call insurance company to determine out of pocket expense if they have not yet received this vaccine. Advised may also receive vaccine at local pharmacy or Health Dept. Verbalized acceptance and understanding.  Screening Tests Health Maintenance  Topic Date Due   DTaP/Tdap/Td (1 - Tdap) Never done   Zoster Vaccines- Shingrix (1 of 2) Never done   DEXA SCAN  07/22/2017   Lung Cancer Screening  04/23/2019   COVID-19 Vaccine (3 - Moderna risk series) 06/07/2019   FOOT EXAM  10/25/2021   HEMOGLOBIN A1C  03/11/2022   OPHTHALMOLOGY EXAM  07/19/2022   Diabetic kidney evaluation - Urine ACR  08/06/2022   Diabetic kidney evaluation - GFR measurement  09/11/2022   Medicare Annual Wellness (AWV)  12/10/2022   MAMMOGRAM  11/07/2023   COLONOSCOPY (Pts 45-14yr Insurance coverage will need to be confirmed)  03/21/2025   Pneumonia Vaccine 72 Years old  Completed   INFLUENZA VACCINE  Completed   Hepatitis C Screening  Completed   HPV VACCINES  Aged Out    Health Maintenance  Health Maintenance Due  Topic Date Due   DTaP/Tdap/Td (1 - Tdap) Never done   Zoster Vaccines- Shingrix (1 of 2) Never done   DEXA SCAN  07/22/2017   Lung Cancer Screening  04/23/2019   COVID-19 Vaccine (3 - Moderna risk series) 06/07/2019   FOOT EXAM  10/25/2021    Colorectal cancer screening: Type of screening: Colonoscopy. Completed 03/22/15. Repeat every 10 years  Mammogram status: Completed 11/06/21. Repeat every year  Bone Density status: Ordered at next office visit after discussion with provider . Pt provided with contact info and advised to call to schedule appt.  Lung Cancer Screening: (Low Dose CT  Chest recommended if Age 219-80years, 30 pack-year currently smoking OR have quit w/in 15years.) does qualify.   Lung Cancer Screening Referral: Patient would like to postpone for now   Additional Screening:  Hepatitis C Screening: does qualify; Completed 09/21/15  Vision Screening: Recommended annual ophthalmology exams for early detection of glaucoma and other disorders of the eye. Is the patient up  to date with their annual eye exam?  Yes  Who is the provider or what is the name of the office in which the patient attends annual eye exams? [facility] Dr. Debe Coder  If pt is not established with a provider, would they like to be referred to a provider to establish care? No .   Dental Screening: Recommended annual dental exams for proper oral hygiene  Community Resource Referral / Chronic Care Management: CRR required this visit?  No   CCM required this visit?  No      Plan:     I have personally reviewed and noted the following in the patient's chart:   Medical and social history Use of alcohol, tobacco or illicit drugs  Current medications and supplements including opioid prescriptions. Patient is not currently taking opioid prescriptions. Functional ability and status Nutritional status Physical activity Advanced directives List of other physicians Hospitalizations, surgeries, and ER visits in previous 12 months Vitals Screenings to include cognitive, depression, and falls Referrals and appointments  In addition, I have reviewed and discussed with patient certain preventive protocols, quality metrics, and best practice recommendations. A written personalized care plan for preventive services as well as general preventive health recommendations were provided to patient.     Vanetta Mulders, LPN   [id number]   Due to this being a virtual visit, the after visit summary with patients personalized plan was offered to patient via mail or my-chart.  Patient would like to  access on my-chart  Nurse Notes: No concerns

## 2021-12-09 NOTE — Patient Instructions (Signed)
[name] , Thank you for taking time to come for your Medicare Wellness Visit. I appreciate your ongoing commitment to your health goals. Please review the following plan we discussed and let me know if I can assist you in the future.   These are the goals we discussed:  Goals      Blood Pressure < 140/90     Exercise 3x per week (30 min per time)     Increase walking.      HEMOGLOBIN A1C < 7.0        This is a list of the screening recommended for you and due dates:  Health Maintenance  Topic Date Due   DTaP/Tdap/Td vaccine (1 - Tdap) Never done   Zoster (Shingles) Vaccine (1 of 2) Never done   DEXA scan (bone density measurement)  07/22/2017   Screening for Lung Cancer  04/23/2019   COVID-19 Vaccine (3 - Moderna risk series) 06/07/2019   Complete foot exam   10/25/2021   Hemoglobin A1C  03/11/2022   Eye exam for diabetics  07/19/2022   Yearly kidney health urinalysis for diabetes  08/06/2022   Yearly kidney function blood test for diabetes  09/11/2022   Medicare Annual Wellness Visit  12/10/2022   Mammogram  11/07/2023   Colon Cancer Screening  03/21/2025   Pneumonia Vaccine  Completed   Flu Shot  Completed   Hepatitis C Screening: USPSTF Recommendation to screen - Ages 18-79 yo.  Completed   HPV Vaccine  Aged Out    Advanced directives: Advance directive discussed with you today. I have provided a copy for you to complete at home and have notarized. Once this is complete please bring a copy in to our office so we can scan it into your chart.   Conditions/risks identified: Aim for 30 minutes of exercise or brisk walking, 6-8 glasses of water, and 5 servings of fruits and vegetables each day.   Next appointment: Follow up in one year for your annual wellness visit    Preventive Care 65 Years and Older, Female Preventive care refers to lifestyle choices and visits with your health care provider that can promote health and wellness. What does preventive care  include? A yearly physical exam. This is also called an annual well check. Dental exams once or twice a year. Routine eye exams. Ask your health care provider how often you should have your eyes checked. Personal lifestyle choices, including: Daily care of your teeth and gums. Regular physical activity. Eating a healthy diet. Avoiding tobacco and drug use. Limiting alcohol use. Practicing safe sex. Taking low-dose aspirin every day. Taking vitamin and mineral supplements as recommended by your health care provider. What happens during an annual well check? The services and screenings done by your health care provider during your annual well check will depend on your age, overall health, lifestyle risk factors, and family history of disease. Counseling  Your health care provider may ask you questions about your: Alcohol use. Tobacco use. Drug use. Emotional well-being. Home and relationship well-being. Sexual activity. Eating habits. History of falls. Memory and ability to understand (cognition). Work and work Statistician. Reproductive health. Screening  You may have the following tests or measurements: Height, weight, and BMI. Blood pressure. Lipid and cholesterol levels. These may be checked every 5 years, or more frequently if you are over 25 years old. Skin check. Lung cancer screening. You may have this screening every year starting at age 42 if you have a 30-pack-year history of  smoking and currently smoke or have quit within the past 15 years. Fecal occult blood test (FOBT) of the stool. You may have this test every year starting at age 72. Flexible sigmoidoscopy or colonoscopy. You may have a sigmoidoscopy every 5 years or a colonoscopy every 10 years starting at age 27. Hepatitis C blood test. Hepatitis B blood test. Sexually transmitted disease (STD) testing. Diabetes screening. This is done by checking your blood sugar (glucose) after you have not eaten for a while  (fasting). You may have this done every 1-3 years. Bone density scan. This is done to screen for osteoporosis. You may have this done starting at age 62. Mammogram. This may be done every 1-2 years. Talk to your health care provider about how often you should have regular mammograms. Talk with your health care provider about your test results, treatment options, and if necessary, the need for more tests. Vaccines  Your health care provider may recommend certain vaccines, such as: Influenza vaccine. This is recommended every year. Tetanus, diphtheria, and acellular pertussis (Tdap, Td) vaccine. You may need a Td booster every 10 years. Zoster vaccine. You may need this after age 33. Pneumococcal 13-valent conjugate (PCV13) vaccine. One dose is recommended after age 26. Pneumococcal polysaccharide (PPSV23) vaccine. One dose is recommended after age 48. Talk to your health care provider about which screenings and vaccines you need and how often you need them. This information is not intended to replace advice given to you by your health care provider. Make sure you discuss any questions you have with your health care provider. Document Released: 01/19/2015 Document Revised: 09/12/2015 Document Reviewed: 10/24/2014 Elsevier Interactive Patient Education  2017 [place] Prevention in the Home Falls can cause injuries. They can happen to people of all ages. There are many things you can do to make your home safe and to help prevent falls. What can I do on the outside of my home? Regularly fix the edges of walkways and driveways and fix any cracks. Remove anything that might make you trip as you walk through a door, such as a raised step or threshold. Trim any bushes or trees on the path to your home. Use bright outdoor lighting. Clear any walking paths of anything that might make someone trip, such as rocks or tools. Regularly check to see if handrails are loose or broken. Make sure that  both sides of any steps have handrails. Any raised decks and porches should have guardrails on the edges. Have any leaves, snow, or ice cleared regularly. Use sand or salt on walking paths during winter. Clean up any spills in your garage right away. This includes oil or grease spills. What can I do in the bathroom? Use night lights. Install grab bars by the toilet and in the tub and shower. Do not use towel bars as grab bars. Use non-skid mats or decals in the tub or shower. If you need to sit down in the shower, use a plastic, non-slip stool. Keep the floor dry. Clean up any water that spills on the floor as soon as it happens. Remove soap buildup in the tub or shower regularly. Attach bath mats securely with double-sided non-slip rug tape. Do not have throw rugs and other things on the floor that can make you trip. What can I do in the bedroom? Use night lights. Make sure that you have a light by your bed that is easy to reach. Do not use any sheets or blankets that  are too big for your bed. They should not hang down onto the floor. Have a firm chair that has side arms. You can use this for support while you get dressed. Do not have throw rugs and other things on the floor that can make you trip. What can I do in the kitchen? Clean up any spills right away. Avoid walking on wet floors. Keep items that you use a lot in easy-to-reach places. If you need to reach something above you, use a strong step stool that has a grab bar. Keep electrical cords out of the way. Do not use floor polish or wax that makes floors slippery. If you must use wax, use non-skid floor wax. Do not have throw rugs and other things on the floor that can make you trip. What can I do with my stairs? Do not leave any items on the stairs. Make sure that there are handrails on both sides of the stairs and use them. Fix handrails that are broken or loose. Make sure that handrails are as long as the stairways. Check  any carpeting to make sure that it is firmly attached to the stairs. Fix any carpet that is loose or worn. Avoid having throw rugs at the top or bottom of the stairs. If you do have throw rugs, attach them to the floor with carpet tape. Make sure that you have a light switch at the top of the stairs and the bottom of the stairs. If you do not have them, ask someone to add them for you. What else can I do to help prevent falls? Wear shoes that: Do not have high heels. Have rubber bottoms. Are comfortable and fit you well. Are closed at the toe. Do not wear sandals. If you use a stepladder: Make sure that it is fully opened. Do not climb a closed stepladder. Make sure that both sides of the stepladder are locked into place. Ask someone to hold it for you, if possible. Clearly mark and make sure that you can see: Any grab bars or handrails. First and last steps. Where the edge of each step is. Use tools that help you move around (mobility aids) if they are needed. These include: Canes. Walkers. Scooters. Crutches. Turn on the lights when you go into a dark area. Replace any light bulbs as soon as they burn out. Set up your furniture so you have a clear path. Avoid moving your furniture around. If any of your floors are uneven, fix them. If there are any pets around you, be aware of where they are. Review your medicines with your doctor. Some medicines can make you feel dizzy. This can increase your chance of falling. Ask your doctor what other things that you can do to help prevent falls. This information is not intended to replace advice given to you by your health care provider. Make sure you discuss any questions you have with your health care provider. Document Released: 10/19/2008 Document Revised: 05/31/2015 Document Reviewed: 01/27/2014 Elsevier Interactive Patient Education  2017 [employer].

## 2021-12-10 ENCOUNTER — Ambulatory Visit (INDEPENDENT_AMBULATORY_CARE_PROVIDER_SITE_OTHER): Payer: Medicare HMO

## 2021-12-10 ENCOUNTER — Encounter: Payer: Self-pay | Admitting: Family

## 2021-12-10 ENCOUNTER — Ambulatory Visit (INDEPENDENT_AMBULATORY_CARE_PROVIDER_SITE_OTHER): Payer: Medicare HMO | Admitting: Family

## 2021-12-10 VITALS — BP 100/55 | HR 72 | Temp 97.5°F | Ht 61.0 in | Wt 134.4 lb

## 2021-12-10 DIAGNOSIS — Z8501 Personal history of malignant neoplasm of esophagus: Secondary | ICD-10-CM

## 2021-12-10 DIAGNOSIS — J431 Panlobular emphysema: Secondary | ICD-10-CM

## 2021-12-10 DIAGNOSIS — G4733 Obstructive sleep apnea (adult) (pediatric): Secondary | ICD-10-CM | POA: Diagnosis not present

## 2021-12-10 DIAGNOSIS — Z0001 Encounter for general adult medical examination with abnormal findings: Secondary | ICD-10-CM | POA: Diagnosis not present

## 2021-12-10 DIAGNOSIS — E1159 Type 2 diabetes mellitus with other circulatory complications: Secondary | ICD-10-CM | POA: Diagnosis not present

## 2021-12-10 DIAGNOSIS — I25118 Atherosclerotic heart disease of native coronary artery with other forms of angina pectoris: Secondary | ICD-10-CM

## 2021-12-10 DIAGNOSIS — I152 Hypertension secondary to endocrine disorders: Secondary | ICD-10-CM

## 2021-12-10 DIAGNOSIS — M810 Age-related osteoporosis without current pathological fracture: Secondary | ICD-10-CM | POA: Diagnosis not present

## 2021-12-10 DIAGNOSIS — Z780 Asymptomatic menopausal state: Secondary | ICD-10-CM

## 2021-12-10 DIAGNOSIS — E785 Hyperlipidemia, unspecified: Secondary | ICD-10-CM | POA: Diagnosis not present

## 2021-12-10 DIAGNOSIS — I2510 Atherosclerotic heart disease of native coronary artery without angina pectoris: Secondary | ICD-10-CM

## 2021-12-10 DIAGNOSIS — K2100 Gastro-esophageal reflux disease with esophagitis, without bleeding: Secondary | ICD-10-CM

## 2021-12-10 DIAGNOSIS — E1169 Type 2 diabetes mellitus with other specified complication: Secondary | ICD-10-CM | POA: Diagnosis not present

## 2021-12-10 DIAGNOSIS — Z122 Encounter for screening for malignant neoplasm of respiratory organs: Secondary | ICD-10-CM

## 2021-12-10 DIAGNOSIS — Z0000 Encounter for general adult medical examination without abnormal findings: Secondary | ICD-10-CM | POA: Diagnosis not present

## 2021-12-10 DIAGNOSIS — E119 Type 2 diabetes mellitus without complications: Secondary | ICD-10-CM | POA: Diagnosis not present

## 2021-12-10 LAB — BAYER DCA HB A1C WAIVED: HB A1C (BAYER DCA - WAIVED): 6.3 % — ABNORMAL HIGH (ref 4.8–5.6)

## 2021-12-10 NOTE — Progress Notes (Signed)
Subjective:    Patient ID: Erica Braun, female    DOB: 1950/01/30, 71 y.o.   MRN: [id number]  Chief Complaint  Patient presents with   Medical Management of Chronic Issues   Pt presents to the office today for CPE and chronic follow up. She is followed by Cardiologists for CAD, pulmonologist's for COPD, and GI for esophageal dysphagia and hx of squamous cell esophageal cancer.   CAD and takes Crestor daily.   These are stable. Has COPD and quit smoking approx 2011. Has intermittent SOB that is worse in the heat.    Has OSA and uses CPAP nightly.  Diabetes She presents for her follow-up diabetic visit. She has type 2 diabetes mellitus. There are no hypoglycemic associated symptoms. Pertinent negatives for hypoglycemia include no headaches. Associated symptoms include foot paresthesias. Pertinent negatives for diabetes include no blurred vision. Symptoms are stable. Diabetic complications include heart disease and peripheral neuropathy. Risk factors for coronary artery disease include dyslipidemia, diabetes mellitus, hypertension, sedentary lifestyle and post-menopausal. Her overall blood glucose range is 110-130 mg/dl. Eye exam is current.  Hypertension This is a chronic problem. The current episode started more than 1 year ago. The problem has been resolved since onset. The problem is controlled. Pertinent negatives include no blurred vision, headaches, malaise/fatigue, palpitations, peripheral edema or shortness of breath. Risk factors for coronary artery disease include dyslipidemia, diabetes mellitus and sedentary lifestyle. The current treatment provides moderate improvement.  Gastroesophageal Reflux She complains of belching and heartburn. This is a chronic problem. The current episode started more than 1 year ago. The problem occurs occasionally. She has tried a PPI for the symptoms. The treatment provided moderate relief.  Hyperlipidemia This is a chronic problem. The  current episode started more than 1 year ago. The problem is controlled. Recent lipid tests were reviewed and are normal. Pertinent negatives include no shortness of breath. Current antihyperlipidemic treatment includes statins. The current treatment provides moderate improvement of lipids. Risk factors for coronary artery disease include dyslipidemia, diabetes mellitus, hypertension, post-menopausal and a sedentary lifestyle.      Review of Systems  Constitutional: Negative.  Negative for malaise/fatigue.  HENT: Negative.    Eyes: Negative.  Negative for blurred vision.  Respiratory: Negative.  Negative for shortness of breath.   Cardiovascular: Negative.  Negative for palpitations.  Gastrointestinal:  Positive for heartburn.  Endocrine: Negative.   Genitourinary: Negative.   Musculoskeletal: Negative.   Neurological: Negative.  Negative for headaches.  Hematological: Negative.   Psychiatric/Behavioral: Negative.    All other systems reviewed and are negative.  Family History  Problem Relation Age of Onset   Cancer Mother        breast   Arthritis Mother    COPD Father 82       emphysema   Alcohol abuse Father    Diabetes Father    Cancer Sister        breast   Early death Brother        overdose   Drug abuse Brother    Alcohol abuse Paternal Aunt    COPD Paternal Aunt    Cancer Maternal Grandmother        liver cancer   Diabetes Maternal Grandmother    Alcohol abuse Maternal Grandfather    Early death Maternal Grandfather    Stroke Paternal Grandmother    Heart disease Paternal Grandfather    Esophageal cancer Neg Hx    Stomach cancer Neg Hx    Colon cancer Neg  Hx    Social History   Socioeconomic History   Marital status: Married    Spouse name: [name]   Number of children: 3   Years of education: 14   Highest education level: Some college, no degree  Occupational History   Occupation: retired    Comment: Advertising account planner  Tobacco Use   Smoking status: Former     Packs/day: 1.50    Years: 41.00    Total pack years: 61.50    Types: Cigarettes    Start date: 1969    Quit date: 2010    Years since quitting: 13.9   Smokeless tobacco: Never  Vaping Use   Vaping Use: Never used  Substance and Sexual Activity   Alcohol use: Yes    Comment: wine occasionaly   Drug use: No   Sexual activity: Yes    Birth control/protection: Post-menopausal  Other Topics Concern   Not on file  Social History Narrative   Recent move from [place] to walk   Married to [employer] dog at home   Social Determinants of [employer]: Low Risk  (12/09/2021)   Overall Financial Resource Strain (CARDIA)    Difficulty of Paying Living Expenses: Not hard at all  Food Insecurity: No Food Insecurity (12/09/2021)   Hunger Vital Sign    Worried About Running Out of Food in the Last Year: Never true    [place] in the Last Year: Never true  Transportation Needs: No Transportation Needs (12/09/2021)   PRAPARE - Hydrologist (Medical): No    Lack of Transportation (Non-Medical): No  Physical Activity: Insufficiently Active (12/09/2021)   Exercise Vital Sign    Days of Exercise per Week: 3 days    Minutes of Exercise per Session: 30 min  Stress: No Stress Concern Present (12/09/2021)   [place]    Feeling of Stress : Only a little  Social Connections: Socially Integrated (12/09/2021)   Social Connection and Isolation Panel [NHANES]    Frequency of Communication with Friends and Family: More than three times a week    Frequency of Social Gatherings with Friends and Family: More than three times a week    Attends Religious Services: More than 4 times per year    Active Member of [employer] or Organizations: Yes    Attends Music therapist: More than 4 times per year    Marital Status: Married       Objective:   Physical Exam Vitals  reviewed.  Constitutional:      General: She is not in acute distress.    Appearance: She is well-developed.  HENT:     Head: Normocephalic and atraumatic.     Right Ear: Tympanic membrane normal.     Left Ear: Tympanic membrane normal.  Eyes:     Pupils: Pupils are equal, round, and reactive to light.  Neck:     Thyroid: No thyromegaly.  Cardiovascular:     Rate and Rhythm: Normal rate and regular rhythm.     Heart sounds: Normal heart sounds. No murmur heard. Pulmonary:     Effort: Pulmonary effort is normal. No respiratory distress.     Breath sounds: Normal breath sounds. No wheezing.  Abdominal:     General: Bowel sounds are normal. There is no distension.     Palpations: Abdomen is soft.  Tenderness: There is no abdominal tenderness.  Musculoskeletal:        General: No tenderness. Normal range of motion.     Cervical back: Normal range of motion and neck supple.  Skin:    General: Skin is warm and dry.  Neurological:     Mental Status: She is alert and oriented to person, place, and time.     Cranial Nerves: No cranial nerve deficit.     Deep Tendon Reflexes: Reflexes are normal and symmetric.  Psychiatric:        Behavior: Behavior normal.        Thought Content: Thought content normal.        Judgment: Judgment normal.    Diabetic Foot Exam - Simple   Simple Foot Form Diabetic Foot exam was performed with the following findings: Yes 12/10/2021 10:32 AM  Visual Inspection No deformities, no ulcerations, no other skin breakdown bilaterally: Yes Sensation Testing Intact to touch and monofilament testing bilaterally: Yes Pulse Check Posterior Tibialis and Dorsalis pulse intact bilaterally: Yes Comments       BP (!) 100/55   Pulse 72   Temp (!) 97.5 F (36.4 C) (Temporal)   Ht _0  (1.549 m)   Wt 134 lb 6.4 oz (61 kg)   SpO2 98%   BMI 25.39 kg/m      Assessment & Plan:   Kenn File Interrante comes in today with chief complaint of Medical  Management of Chronic Issues   Diagnosis and orders addressed:  1. Type 2 diabetes mellitus without complication, without long-term current use of insulin (HCC) - Bayer DCA Hb A1c Waived - CMP14+EGFR - CBC with Differential/Platelet  2. Hypertension associated with diabetes ([place]) - CMP14+EGFR - CBC with Differential/Platelet  3. Hyperlipidemia associated with type 2 diabetes mellitus (HCC) - CMP14+EGFR - CBC with Differential/Platelet  4. Gastroesophageal reflux disease with esophagitis without hemorrhage - CMP14+EGFR - CBC with Differential/Platelet  5. Panlobular emphysema (HCC) - CMP14+EGFR - CBC with Differential/Platelet  6. CAD in native artery - CMP14+EGFR - CBC with Differential/Platelet  7. OSA on CPAP - CMP14+EGFR - CBC with Differential/Platelet  8. History of esophageal cancer - CMP14+EGFR - CBC with Differential/Platelet  9. Coronary artery disease of native artery of native heart with stable angina pectoris (HCC) - CMP14+EGFR - CBC with Differential/Platelet  10. Post-menopause - DG [facility] DEXA - CMP14+EGFR - CBC with Differential/Platelet  11. Encounter for screening for lung cancer - CT CHEST LUNG CA SCREEN LOW DOSE W/O CM; Future - CMP14+EGFR - CBC with Differential/Platelet  12. Annual physical exam - Lipid panel - TSH   Labs pending Health Maintenance reviewed Diet and exercise encouraged  Follow up plan: 4 months    Evelina Dun, [name]

## 2021-12-10 NOTE — Patient Instructions (Signed)
Health Maintenance After Age 72 After age 72, you are at a higher risk for certain long-term diseases and infections as well as injuries from falls. Falls are a major cause of broken bones and head injuries in people who are older than age 72. Getting regular preventive care can help to keep you healthy and well. Preventive care includes getting regular testing and making lifestyle changes as recommended by your health care provider. Talk with your health care provider about: Which screenings and tests you should have. A screening is a test that checks for a disease when you have no symptoms. A diet and exercise plan that is right for you. What should I know about screenings and tests to prevent falls? Screening and testing are the best ways to find a health problem early. Early diagnosis and treatment give you the best chance of managing medical conditions that are common after age 72. Certain conditions and lifestyle choices may make you more likely to have a fall. Your health care provider may recommend: Regular vision checks. Poor vision and conditions such as cataracts can make you more likely to have a fall. If you wear glasses, make sure to get your prescription updated if your vision changes. Medicine review. Work with your health care provider to regularly review all of the medicines you are taking, including over-the-counter medicines. Ask your health care provider about any side effects that may make you more likely to have a fall. Tell your health care provider if any medicines that you take make you feel dizzy or sleepy. Strength and balance checks. Your health care provider may recommend certain tests to check your strength and balance while standing, walking, or changing positions. Foot health exam. Foot pain and numbness, as well as not wearing proper footwear, can make you more likely to have a fall. Screenings, including: Osteoporosis screening. Osteoporosis is a condition that causes  the bones to get weaker and break more easily. Blood pressure screening. Blood pressure changes and medicines to control blood pressure can make you feel dizzy. Depression screening. You may be more likely to have a fall if you have a fear of falling, feel depressed, or feel unable to do activities that you used to do. Alcohol use screening. Using too much alcohol can affect your balance and may make you more likely to have a fall. Follow these instructions at home: Lifestyle Do not drink alcohol if: Your health care provider tells you not to drink. If you drink alcohol: Limit how much you have to: 0-1 drink a day for women. 0-2 drinks a day for men. Know how much alcohol is in your drink. In the [place] one drink equals one 12 oz bottle of beer (355 mL), one 5 oz glass of wine (148 mL), or one 1 oz glass of hard liquor (44 mL). Do not use any products that contain nicotine or tobacco. These products include cigarettes, chewing tobacco, and vaping devices, such as e-cigarettes. If you need help quitting, ask your health care provider. Activity  Follow a regular exercise program to stay fit. This will help you maintain your balance. Ask your health care provider what types of exercise are appropriate for you. If you need a cane or walker, use it as recommended by your health care provider. Wear supportive shoes that have nonskid soles. Safety  Remove any tripping hazards, such as rugs, cords, and clutter. Install safety equipment such as grab bars in bathrooms and safety rails on stairs. Keep rooms and walkways   well-lit. General instructions Talk with your health care provider about your risks for falling. Tell your health care provider if: You fall. Be sure to tell your health care provider about all falls, even ones that seem minor. You feel dizzy, tiredness (fatigue), or off-balance. Take over-the-counter and prescription medicines only as told by your health care provider. These include  supplements. Eat a healthy diet and maintain a healthy weight. A healthy diet includes low-fat dairy products, low-fat (lean) meats, and fiber from whole grains, beans, and lots of fruits and vegetables. Stay current with your vaccines. Schedule regular health, dental, and eye exams. Summary Having a healthy lifestyle and getting preventive care can help to protect your health and wellness after age 72. Screening and testing are the best way to find a health problem early and help you avoid having a fall. Early diagnosis and treatment give you the best chance for managing medical conditions that are more common for people who are older than age 72. Falls are a major cause of broken bones and head injuries in people who are older than age 72. Take precautions to prevent a fall at home. Work with your health care provider to learn what changes you can make to improve your health and wellness and to prevent falls. This information is not intended to replace advice given to you by your health care provider. Make sure you discuss any questions you have with your health care provider. Document Revised: 05/14/2020 Document Reviewed: 05/14/2020 Elsevier Patient Education  2023 [employer] Inc[employer]  

## 2021-12-11 LAB — CBC WITH DIFFERENTIAL/PLATELET
Basophils Absolute: 0 10*3/uL (ref 0.0–0.2)
Basos: 0 %
EOS (ABSOLUTE): 0.1 10*3/uL (ref 0.0–0.4)
Eos: 2 %
Hematocrit: 34.7 % (ref 34.0–46.6)
Hemoglobin: 11.3 g/dL (ref 11.1–15.9)
Immature Grans (Abs): 0 10*3/uL (ref 0.0–0.1)
Immature Granulocytes: 0 %
Lymphocytes Absolute: 0.8 10*3/uL (ref 0.7–3.1)
Lymphs: 23 %
MCH: 26.8 pg (ref 26.6–33.0)
MCHC: 32.6 g/dL (ref 31.5–35.7)
MCV: 82 fL (ref 79–97)
Monocytes Absolute: 0.4 10*3/uL (ref 0.1–0.9)
Monocytes: 10 %
Neutrophils Absolute: 2.2 10*3/uL (ref 1.4–7.0)
Neutrophils: 65 %
Platelets: 149 10*3/uL — ABNORMAL LOW (ref 150–450)
RBC: 4.22 x10E6/uL (ref 3.77–5.28)
RDW: 13.8 % (ref 11.7–15.4)
WBC: 3.4 10*3/uL (ref 3.4–10.8)

## 2021-12-11 LAB — CMP14+EGFR
ALT: 15 IU/L (ref 0–32)
AST: 13 IU/L (ref 0–40)
Albumin/Globulin Ratio: 2 (ref 1.2–2.2)
Albumin: 4.5 g/dL (ref 3.8–4.8)
Alkaline Phosphatase: 107 IU/L (ref 44–121)
BUN/Creatinine Ratio: 17 (ref 12–28)
BUN: 13 mg/dL (ref 8–27)
Bilirubin Total: 0.4 mg/dL (ref 0.0–1.2)
CO2: 22 mmol/L (ref 20–29)
Calcium: 9.3 mg/dL (ref 8.7–10.3)
Chloride: 102 mmol/L (ref 96–106)
Creatinine, Ser: 0.76 mg/dL (ref 0.57–1.00)
Globulin, Total: 2.2 g/dL (ref 1.5–4.5)
Glucose: 115 mg/dL — ABNORMAL HIGH (ref 70–99)
Potassium: 4.1 mmol/L (ref 3.5–5.2)
Sodium: 139 mmol/L (ref 134–144)
Total Protein: 6.7 g/dL (ref 6.0–8.5)
eGFR: 83 mL/min/{1.73_m2} (ref >59)

## 2021-12-11 LAB — LIPID PANEL
Chol/HDL Ratio: 2.3 ratio (ref 0.0–4.4)
Cholesterol, Total: 147 mg/dL (ref 100–199)
HDL: 64 mg/dL (ref >39)
LDL Chol Calc (NIH): 69 mg/dL (ref 0–99)
Triglycerides: 72 mg/dL (ref 0–149)
VLDL Cholesterol Cal: 14 mg/dL (ref 5–40)

## 2021-12-11 LAB — TSH: TSH: 2.14 u[IU]/mL (ref 0.450–4.500)

## 2021-12-16 ENCOUNTER — Encounter: Payer: Self-pay | Admitting: Family

## 2021-12-16 ENCOUNTER — Ambulatory Visit: Payer: Self-pay | Admitting: Surgery

## 2021-12-16 ENCOUNTER — Other Ambulatory Visit: Payer: Self-pay | Admitting: Family

## 2021-12-16 DIAGNOSIS — D241 Benign neoplasm of right breast: Secondary | ICD-10-CM | POA: Diagnosis not present

## 2021-12-16 DIAGNOSIS — M810 Age-related osteoporosis without current pathological fracture: Secondary | ICD-10-CM

## 2021-12-16 HISTORY — DX: Age-related osteoporosis without current pathological fracture: M81.0

## 2021-12-16 MED ORDER — ALENDRONATE SODIUM 70 MG PO TABS: 70.0000 mg | ORAL_TABLET | ORAL | 11 refills | Status: AC

## 2021-12-18 ENCOUNTER — Other Ambulatory Visit: Payer: Self-pay | Admitting: Surgery

## 2021-12-18 DIAGNOSIS — D241 Benign neoplasm of right breast: Secondary | ICD-10-CM

## 2021-12-26 NOTE — Progress Notes (Signed)
CARDIOLOGY CONSULT NOTE       Patient ID: Erica Braun MRN: [id number] DOB/AGE: 1950/04/05 71 y.o.  Admit date: (Not on file) Referring Physician: Halford Chessman Primary Physician: Sharion Balloon, [name] Primary Cardiologist: Johnsie Cancel   HPI:  70 y.o. referred by Dr Halford Chessman for chest pain and dyspnea. First seen on 07/10/21  Sees pulmonary for asthma/COPD Quit smoking 12 years ago Uses CPAP For OSA  Has history of stage one esophageal cancer followed at [facility] Last EGD 01/2021 with no cancer and dilatation performed She had some chest symptoms on diagnosis of this She is diabetic with HTN and HLD.    Previously seen by Dr Jacinta Shoe Cath by Dr Tamala Julian 06/12/17 with CTO RCA with left to right collaterals and 60% D1 stenosis EF 55% Medical Rx recommended She was prescribed imdur but for some reason this was stopped on her last pulmonary visit Prior to cath had an abnormal myovue with moderate mixed defect in inferior wall EF 52%   She has been having exertional dyspnea but denies chest pain Dyspnea better after pollen season and when not in humidity like when she saw her brother in [place] who owns a vinyard  Lipitor giving her bad leg pains Changed to crestor July 2023 improved and LDL at goal 69 12/10/21   Still with some dysphagia from esophageal cancer   She is being w/u for abnormal right breast mass and will need surgery with Dr Brantley Stage  01/14/22  No angina active breathing better From cardiac perspective ok to have surgery     ROS All other systems reviewed and negative except as noted above  Past Medical History:  Diagnosis Date   Complication of anesthesia    COPD (chronic obstructive pulmonary disease) ([place])    Coronary artery disease    Diabetes mellitus without complication ([place])    Diverticulosis    Early cataracts, bilateral 11/21/2016   Emphysema of lung ([place])    Esophageal cancer ([place])    GERD (gastroesophageal reflux disease)    Glaucoma    Hyperlipidemia    Hypertension     Internal hemorrhoids    Osteoporosis 12/16/2021   Pneumonia    PONV (postoperative nausea and vomiting)    Sleep apnea    uses Cpap   Spasm of the cricopharyngeus muscle    Squamous cell esophageal cancer ([place]) 05/10/2018    Family History  Problem Relation Age of Onset   Cancer Mother        breast   Arthritis Mother    COPD Father 48       emphysema   Alcohol abuse Father    Diabetes Father    Cancer Sister        breast   Early death Brother        overdose   Drug abuse Brother    Alcohol abuse Paternal Aunt    COPD Paternal Aunt    Cancer Maternal Grandmother        liver cancer   Diabetes Maternal Grandmother    Alcohol abuse Maternal Grandfather    Early death Maternal Grandfather    Stroke Paternal Grandmother    Heart disease Paternal Grandfather    Esophageal cancer Neg Hx    Stomach cancer Neg Hx    Colon cancer Neg Hx     Social History   Socioeconomic History   Marital status: Married    Spouse name: [name]   Number of children: 3   Years of education: 86  Highest education level: Some college, no degree  Occupational History   Occupation: retired    Comment: Advertising account planner  Tobacco Use   Smoking status: Former    Packs/day: 1.50    Years: 41.00    Total Braun years: 61.50    Types: Cigarettes    Start date: 1969    Quit date: 2010    Years since quitting: 14.0   Smokeless tobacco: Never  Vaping Use   Vaping Use: Never used  Substance and Sexual Activity   Alcohol use: Yes    Comment: wine occasionaly   Drug use: No   Sexual activity: Yes    Birth control/protection: Post-menopausal  Other Topics Concern   Not on file  Social History Narrative   Recent move from [place] to walk   Married to [employer] dog at home   Social Determinants of [employer]: Low Risk  (12/09/2021)   Overall Financial Resource Strain (CARDIA)    Difficulty of Paying Living Expenses: Not hard at all  Food Insecurity: No Food  Insecurity (12/09/2021)   Hunger Vital Sign    Worried About Running Out of Food in the Last Year: Never true    [place] in the Last Year: Never true  Transportation Needs: No Transportation Needs (12/09/2021)   PRAPARE - Hydrologist (Medical): No    Lack of Transportation (Non-Medical): No  Physical Activity: Insufficiently Active (12/09/2021)   Exercise Vital Sign    Days of Exercise per Week: 3 days    Minutes of Exercise per Session: 30 min  Stress: No Stress Concern Present (12/09/2021)   [place]    Feeling of Stress : Only a little  Social Connections: Socially Integrated (12/09/2021)   Social Connection and Isolation Panel [NHANES]    Frequency of Communication with Friends and Family: More than three times a week    Frequency of Social Gatherings with Friends and Family: More than three times a week    Attends Religious Services: More than 4 times per year    Active Member of Clubs or Organizations: Yes    Attends Archivist Meetings: More than 4 times per year    Marital Status: Married  Human resources officer Violence: Not At Risk (12/09/2021)   Humiliation, Afraid, Rape, and Kick questionnaire    Fear of Current or Ex-Partner: No    Emotionally Abused: No    Physically Abused: No    Sexually Abused: No    Past Surgical History:  Procedure Laterality Date   ABDOMINAL HYSTERECTOMY     cervical dysplasia   BIOPSY  05/03/2018   Procedure: BIOPSY;  Surgeon: Rush Landmark, Telford Nab., MD;  Location: [place];  Service: Gastroenterology;;   BREAST BIOPSY Left    BREAST BIOPSY Right 11/27/2021   MM RT BREAST BX W LOC DEV 1ST LESION IMAGE BX SPEC STEREO GUIDE 11/27/2021 [facility]   BREAST LUMPECTOMY Left    ESOPHAGOGASTRODUODENOSCOPY (EGD) WITH PROPOFOL N/A 05/03/2018   Procedure: ESOPHAGOGASTRODUODENOSCOPY (EGD) WITH PROPOFOL;  Surgeon: Irving Copas.,  MD;  Location: [place];  Service: Gastroenterology;  Laterality: N/A;   ESOPHAGOGASTRODUODENOSCOPY (EGD) WITH PROPOFOL N/A 11/18/2018   Procedure: ESOPHAGOGASTRODUODENOSCOPY (EGD) WITH PROPOFOL;  Surgeon: Irene Shipper, MD;  Location[facility] ENDOSCOPY;  Service: Endoscopy;  Laterality: N/A;   EUS  05/03/2018   Procedure: UPPER ENDOSCOPIC ULTRASOUND (EUS) RADIAL;  Surgeon: Irving Copas., MD;  Location[facility] ENDOSCOPY;  Service: Gastroenterology;;   FOREIGN BODY REMOVAL  11/18/2018   Procedure: FOREIGN BODY REMOVAL;  Surgeon: Irene Shipper, MD;  Location[facility] ENDOSCOPY;  Service: Endoscopy;;   LEFT HEART CATH AND CORONARY ANGIOGRAPHY N/A 06/11/2017   Procedure: LEFT HEART CATH AND CORONARY ANGIOGRAPHY;  Surgeon: Belva Crome, MD;  Location: [place] CV LAB;  Service: Cardiovascular;  Laterality: N/A;      Current Outpatient Medications:    albuterol (PROVENTIL) (2.5 MG/3ML) 0.083% nebulizer solution, Take 3 mLs (2.5 mg total) by nebulization every 6 (six) hours as needed for wheezing or shortness of breath., Disp: 75 mL, Rfl: 3   alendronate (FOSAMAX) 70 MG tablet, Take 1 tablet (70 mg total) by mouth every 7 (seven) days. Take with a full glass of water on an empty stomach., Disp: 4 tablet, Rfl: 11   aspirin EC 81 MG tablet, Take 81 mg by mouth daily., Disp: , Rfl:    Blood Glucose Monitor Software DEVI, Test BS BID and PRN Dx E11.9, Disp: 1 Device, Rfl: 1   empagliflozin (JARDIANCE) 10 MG TABS tablet, Take 1 tablet (10 mg total) by mouth daily before breakfast., Disp: 90 tablet, Rfl: 1   famotidine (PEPCID) 40 MG tablet, Take 1 tablet (40 mg total) by mouth at bedtime., Disp: 90 tablet, Rfl: 3   fluticasone-salmeterol (ADVAIR) 250-50 MCG/ACT AEPB, Inhale 1 puff into the lungs every 12 (twelve) hours., Disp: 60 each, Rfl: 11   glipiZIDE (GLUCOTROL XL) 2.5 MG 24 hr tablet, Take 1 tablet (2.5 mg total) by mouth daily with breakfast., Disp: 90 tablet, Rfl: 1   glucose blood test strip,  test BS BID Dx E11.9, Disp: 200 each, Rfl: 3   isosorbide mononitrate (IMDUR) 30 MG 24 hr tablet, Take 0.5 tablets (15 mg total) by mouth daily., Disp: 45 tablet, Rfl: 3   losartan (COZAAR) 25 MG tablet, Take 1 tablet by mouth once daily, Disp: 90 tablet, Rfl: 0   metFORMIN (GLUCOPHAGE) 1000 MG tablet, Take 1 tablet (1,000 mg total) by mouth 2 (two) times daily with a meal., Disp: 200 tablet, Rfl: 0   Multiple Vitamins-Minerals (MULTIVITAMIN PO), Take 1 tablet by mouth daily., Disp: , Rfl:    omeprazole (PRILOSEC) 40 MG capsule, Take 1 capsule (40 mg total) by mouth 2 (two) times daily before a meal., Disp: 180 capsule, Rfl: 0   rosuvastatin (CRESTOR) 10 MG tablet, Take 1 tablet (10 mg total) by mouth daily., Disp: 90 tablet, Rfl: 3   nitroGLYCERIN (NITROSTAT) 0.4 MG SL tablet, Place 1 tablet (0.4 mg total) under the tongue every 5 (five) minutes as needed for chest pain., Disp: 25 tablet, Rfl: 3    Physical Exam: Blood pressure 99/63, pulse 96, height '5\' 2"'$  (1.575 m), weight 134 lb 3.2 oz (60.9 kg), SpO2 95 %.   Affect appropriate Healthy:  appears stated age 72: normal Neck supple with no adenopathy JVP normal no bruits no thyromegaly Lungs clear with no wheezing and good diaphragmatic motion Heart:  S1/S2 no murmur, no rub, gallop or click PMI normal Abdomen: benighn, BS positve, no tenderness, no AAA no bruit.  No HSM or HJR Distal pulses intact with no bruits No edema Neuro non-focal Skin warm and dry No muscular weakness   Labs:   Lab Results  Component Value Date   WBC 3.4 12/10/2021   HGB 11.3 12/10/2021   HCT 34.7 12/10/2021   MCV 82 12/10/2021   PLT 149 (  L) 12/10/2021   No results for input(s): "NA", "K", "CL", "CO2", "BUN", "CREATININE", "CALCIUM", "PROT", "BILITOT", "ALKPHOS", "ALT", "AST", "GLUCOSE" in the last 168 hours.  Invalid input(s): "LABALBU" No results found for: "CKTOTAL", "CKMB", "CKMBINDEX", "TROPONINI"  Lab Results  Component Value Date    CHOL 147 12/10/2021   CHOL 126 09/10/2021   CHOL 203 (H) 10/25/2020   Lab Results  Component Value Date   HDL 64 12/10/2021   HDL 54 09/10/2021   HDL 52 10/25/2020   Lab Results  Component Value Date   LDLCALC 69 12/10/2021   LDLCALC 49 09/10/2021   LDLCALC 124 (H) 10/25/2020   Lab Results  Component Value Date   TRIG 72 12/10/2021   TRIG 135 09/10/2021   TRIG 154 (H) 10/25/2020   Lab Results  Component Value Date   CHOLHDL 2.3 12/10/2021   CHOLHDL 2.3 09/10/2021   CHOLHDL 3.9 10/25/2020   No results found for: "LDLDIRECT"    Radiology: DG [facility] DEXA  Result Date: 12/10/2021 EXAM: DUAL X-RAY ABSORPTIOMETRY (DXA) FOR BONE MINERAL DENSITY 12/10/2021 4:40 pm CLINICAL DATA:  71 year old Female Postmenopausal. post menopause TECHNIQUE: An axial (e.g., hips, spine) and/or appendicular (e.g., radius) exam was performed, as appropriate, using GE Nature conservation officer at [place]. Images are obtained for bone mineral density measurement and are not obtained for diagnostic purposes. [id number] Exclusions: Lumbar L4. COMPARISON:  None. FINDINGS: Scan quality: Good. LUMBAR SPINE (L1-L3): BMD (in g/cm2): 0.888 T-score: -2.4 Z-score: -0.6 LEFT FEMORAL NECK: BMD (in g/cm2): 0.692 T-score: -2.5 Z-score: -0.6 LEFT TOTAL HIP: BMD (in g/cm2): 0.684 T-score: -2.6 Z-score: -0.9 RIGHT FEMORAL NECK: BMD (in g/cm2): 0.720 T-score: -2.3 Z-score: -0.4 RIGHT TOTAL HIP: BMD (in g/cm2): 0.724 T-score: -2.3 Z-score: -0.6 FRAX 10-YEAR PROBABILITY OF FRACTURE: Patient does not meet criteria for FRAX assessment. IMPRESSION: Osteoporosis based on BMD. Fracture risk is increased. Increased risk is based on low BMD. RECOMMENDATIONS: 1. All patients should optimize calcium and vitamin D intake. 2. Consider FDA-approved medical therapies in postmenopausal women and men aged 30 years and older, based on the following: - A hip or vertebral (clinical or morphometric) fracture - T-score  less than or equal to -2.5 and secondary causes have been excluded. - Low bone mass (T-score between -1.0 and -2.5) and a 10-year probability of a hip fracture greater than or equal to 3% or a 10-year probability of a major osteoporosis-related fracture greater than or equal to 20% based on the [place]-adapted WHO algorithm. - Clinician judgment and/or patient preferences may indicate treatment for people with 10-year fracture probabilities above or below these levels 3. Patients with diagnosis of osteoporosis or at high risk for fracture should have regular bone mineral density tests. For patients eligible for Medicare, routine testing is allowed once every 2 years. The testing frequency can be increased to one year for patients who have rapidly progressing disease, those who are receiving or discontinuing medical therapy to restore bone mass, or have additional risk factors. Electronically Signed   By: Lavonia Dana M.D.   On: 12/10/2021 16:59    EKG: ST rate 133 no acute ST changes 11/19/18  01/03/2022 SR normal    ASSESSMENT AND PLAN:   CAD:  cath 2019 with CTO RCA left to right collaterals and 60% D1 stenosis Currently on ASA/Statin Change to crestor add back imdur 15 mg daily No angina continue medical Rx  COPD:  f/u [facility] no recent CXR/CT   Albuterol inhaler  Esophageal Cancer:  F/U [facility] on prilosec and pepcid Last EGD 2021 should have f/u with Dr Hilarie Fredrickson ? [facility] HTN:  continue losartan given DM HLD:  Last LDL in Epic 124 10/25/20 will start crestor 10 mg labs in 3 months  DM:  Discussed low carb diet.  Target hemoglobin A1c is 6.5 or less.  Continue current medications.  A1c 7.0 10/25/20  Breast Cancer:  Surgery in January with Dr Brantley Stage  clear to have from cardiac perspective     F/U in 6 months   Signed: Jenkins Rouge 01/03/2022, 1:37 PM

## 2022-01-03 ENCOUNTER — Encounter: Payer: Self-pay | Admitting: Cardiovascular Disease

## 2022-01-03 ENCOUNTER — Ambulatory Visit: Payer: Medicare HMO | Attending: Cardiovascular Disease | Admitting: Cardiovascular Disease

## 2022-01-03 VITALS — BP 99/63 | HR 96 | Ht 62.0 in | Wt 134.2 lb

## 2022-01-03 DIAGNOSIS — E1169 Type 2 diabetes mellitus with other specified complication: Secondary | ICD-10-CM

## 2022-01-03 DIAGNOSIS — J431 Panlobular emphysema: Secondary | ICD-10-CM | POA: Diagnosis not present

## 2022-01-03 DIAGNOSIS — Z01810 Encounter for preprocedural cardiovascular examination: Secondary | ICD-10-CM | POA: Diagnosis not present

## 2022-01-03 DIAGNOSIS — E782 Mixed hyperlipidemia: Secondary | ICD-10-CM | POA: Diagnosis not present

## 2022-01-03 DIAGNOSIS — I2510 Atherosclerotic heart disease of native coronary artery without angina pectoris: Secondary | ICD-10-CM | POA: Diagnosis not present

## 2022-01-03 DIAGNOSIS — E785 Hyperlipidemia, unspecified: Secondary | ICD-10-CM | POA: Diagnosis not present

## 2022-01-03 NOTE — Patient Instructions (Signed)
Medication Instructions:  Your physician recommends that you continue on your current medications as directed. Please refer to the Current Medication list given to you today.   Labwork: None  Testing/Procedures: None  Follow-Up: Follow up with Dr. Nishan in 6 months.   Any Other Special Instructions Will Be Listed Below (If Applicable).     If you need a refill on your cardiac medications before your next appointment, please call your pharmacy.  

## 2022-01-09 NOTE — Pre-Procedure Instructions (Signed)
Surgical Instructions    Your procedure is scheduled on Wednesday 01/15/22.   Report to [facility] Main Entrance "A" at 09:00 A.M., then check in with the Admitting office.  Call this number if you have problems the morning of surgery:  [phone]   If you have any questions prior to your surgery date call [phone]: Open Monday-Friday 8am-4pm If you experience any cold or flu symptoms such as cough, fever, chills, shortness of breath, etc. between now and your scheduled surgery, please notify us at the above number     Remember:  Do not eat after midnight the night before your surgery  You may drink clear liquids until 08:00 A.M. the morning of your surgery.   Clear liquids allowed are: Water, Non-Citrus Juices (without pulp), Carbonated Beverages, Clear Tea, Black Coffee ONLY (NO MILK, CREAM OR POWDERED CREAMER of any kind), and Gatorade    Take these medicines the morning of surgery with A SIP OF WATER:   fluticasone-salmeterol (ADVAIR)   isosorbide mononitrate (IMDUR)   omeprazole (PRILOSEC)     Take these medicines if needed:   albuterol (PROVENTIL)     As of today, STOP taking any Aspirin (unless otherwise instructed by your surgeon) Aleve, Naproxen, Ibuprofen, Motrin, Advil, Goody's, BC's, all herbal medications, fish oil, and all vitamins.  WHAT DO I DO ABOUT MY DIABETES MEDICATION?   Do not take oral diabetes medicines (pills) the morning of surgery.  DO NOT TAKE empagliflozin (JARDIANCE) 72 hours prior to surgery. Last dose should be taken on Saturday 01/11/22.  DO NOT TAKE glimepiride (AMARYL) the morning of surgery.   DO NOT TAKE metFORMIN (GLUCOPHAGE)  the morning of surgery.   The day of surgery, do not take other diabetes injectables, including Byetta (exenatide), Bydureon (exenatide ER), Victoza (liraglutide), or Trulicity (dulaglutide).  If your CBG is greater than 220 mg/dL, you may take  of your sliding scale (correction) dose of insulin.   HOW TO  MANAGE YOUR DIABETES BEFORE AND AFTER SURGERY  Why is it important to control my blood sugar before and after surgery? Improving blood sugar levels before and after surgery helps healing and can limit problems. A way of improving blood sugar control is eating a healthy diet by:  Eating less sugar and carbohydrates  Increasing activity/exercise  Talking with your doctor about reaching your blood sugar goals High blood sugars (greater than 180 mg/dL) can raise your risk of infections and slow your recovery, so you will need to focus on controlling your diabetes during the weeks before surgery. Make sure that the doctor who takes care of your diabetes knows about your planned surgery including the date and location.  How do I manage my blood sugar before surgery? Check your blood sugar at least 4 times a day, starting 2 days before surgery, to make sure that the level is not too high or low.  Check your blood sugar the morning of your surgery when you wake up and every 2 hours until you get to the Short Stay unit.  If your blood sugar is less than 70 mg/dL, you will need to treat for low blood sugar: Do not take insulin. Treat a low blood sugar (less than 70 mg/dL) with  cup of clear juice (cranberry or apple), 4 glucose tablets, OR glucose gel. Recheck blood sugar in 15 minutes after treatment (to make sure it is greater than 70 mg/dL). If your blood sugar is not greater than 70 mg/dL on recheck, call [phone] for further  instructions. Report your blood sugar to the short stay nurse when you get to Short Stay.  If you are admitted to the hospital after surgery: Your blood sugar will be checked by the staff and you will probably be given insulin after surgery (instead of oral diabetes medicines) to make sure you have good blood sugar levels. The goal for blood sugar control after surgery is 80-180 mg/dL.            Do not wear jewelry or makeup. Do not wear lotions, powders,  perfumes/cologne or deodorant. Do not shave 48 hours prior to surgery.  Men may shave face and neck. Do not bring valuables to the hospital. Do not wear nail polish, gel polish, artificial nails, or any other type of covering on natural nails (fingers and toes) If you have artificial nails or gel coating that need to be removed by a nail salon, please have this removed prior to surgery. Artificial nails or gel coating may interfere with anesthesia's ability to adequately monitor your vital signs.  [facility] is not responsible for any belongings or valuables.    Do NOT Smoke (Tobacco/Vaping)  24 hours prior to your procedure  If you use a CPAP at night, you may bring your mask for your overnight stay.   Contacts, glasses, hearing aids, dentures or partials may not be worn into surgery, please bring cases for these belongings   For patients admitted to the hospital, discharge time will be determined by your treatment team.   Patients discharged the day of surgery will not be allowed to drive home, and someone needs to stay with them for 24 hours.   SURGICAL WAITING ROOM VISITATION Patients having surgery or a procedure may have no more than 2 support people in the waiting area - these visitors may rotate.   Children under the age of 31 must have an adult with them who is not the patient. If the patient needs to stay at the hospital during part of their recovery, the visitor guidelines for inpatient rooms apply. Pre-op nurse will coordinate an appropriate time for 1 support person to accompany patient in pre-op.  This support person may not rotate.   Please refer to [email or web address].hu for the visitor guidelines for Inpatients (after your surgery is over and you are in a regular room).    Special instructions:    Oral Hygiene is also important to reduce your risk of infection.  Remember - BRUSH YOUR TEETH THE MORNING OF SURGERY WITH YOUR  REGULAR TOOTHPASTE   [place]- Preparing For Surgery  Before surgery, you can play an important role. Because skin is not sterile, your skin needs to be as free of germs as possible. You can reduce the number of germs on your skin by washing with CHG (chlorahexidine gluconate) Soap before surgery.  CHG is an antiseptic cleaner which kills germs and bonds with the skin to continue killing germs even after washing.     Please do not use if you have an allergy to CHG or antibacterial soaps. If your skin becomes reddened/irritated stop using the CHG.  Do not shave (including legs and underarms) for at least 48 hours prior to first CHG shower. It is OK to shave your face.  Please follow these instructions carefully.     Shower the NIGHT BEFORE SURGERY and the MORNING OF SURGERY with CHG Soap.   If you chose to wash your hair, wash your hair first as usual with your normal shampoo. After  you shampoo, rinse your hair and body thoroughly to remove the shampoo.  Then [employer] and genitals (private parts) with your normal soap and rinse thoroughly to remove soap.  After that Use CHG Soap as you would any other liquid soap. You can apply CHG directly to the skin and wash gently with a scrungie or a clean washcloth.   Apply the CHG Soap to your body ONLY FROM THE NECK DOWN.  Do not use on open wounds or open sores. Avoid contact with your eyes, ears, mouth and genitals (private parts). Wash Face and genitals (private parts)  with your normal soap.   Wash thoroughly, paying special attention to the area where your surgery will be performed.  Thoroughly rinse your body with warm water from the neck down.  DO NOT shower/wash with your normal soap after using and rinsing off the CHG Soap.  Pat yourself dry with a CLEAN TOWEL.  Wear CLEAN PAJAMAS to bed the night before surgery  Place CLEAN SHEETS on your bed the night before your surgery  DO NOT SLEEP WITH PETS.   Day of Surgery:  Take a  shower with CHG soap. Wear Clean/Comfortable clothing the morning of surgery Do not apply any deodorants/lotions.   Remember to brush your teeth WITH YOUR REGULAR TOOTHPASTE.    If you received a COVID test during your pre-op visit, it is requested that you wear a mask when out in public, stay away from anyone that may not be feeling well, and notify your surgeon if you develop symptoms. If you have been in contact with anyone that has tested positive in the last 10 days, please notify your surgeon.    Please read over the following fact sheets that you were given.

## 2022-01-10 ENCOUNTER — Encounter (HOSPITAL_COMMUNITY)
Admission: RE | Admit: 2022-01-10 | Discharge: 2022-01-10 | Disposition: A | Discharge status: Home / Self Care | Payer: PPO | Source: Ambulatory Visit | Attending: Surgery | Admitting: Surgery

## 2022-01-10 ENCOUNTER — Other Ambulatory Visit: Payer: Self-pay

## 2022-01-10 ENCOUNTER — Encounter (HOSPITAL_COMMUNITY): Payer: Self-pay

## 2022-01-10 VITALS — BP 114/51 | Temp 97.7°F | Resp 18 | Ht 62.0 in | Wt 132.9 lb

## 2022-01-10 DIAGNOSIS — I152 Hypertension secondary to endocrine disorders: Secondary | ICD-10-CM | POA: Diagnosis not present

## 2022-01-10 DIAGNOSIS — Z01818 Encounter for other preprocedural examination: Secondary | ICD-10-CM | POA: Diagnosis not present

## 2022-01-10 DIAGNOSIS — E119 Type 2 diabetes mellitus without complications: Secondary | ICD-10-CM | POA: Diagnosis not present

## 2022-01-10 LAB — BASIC METABOLIC PANEL
Anion gap: 9 (ref 5–15)
BUN: 18 mg/dL (ref 8–23)
CO2: 25 mmol/L (ref 22–32)
Calcium: 9.2 mg/dL (ref 8.9–10.3)
Chloride: 103 mmol/L (ref 98–111)
Creatinine, Ser: 0.91 mg/dL (ref 0.44–1.00)
GFR, Estimated: >60 mL/min (ref >60)
Glucose, Bld: 115 mg/dL — ABNORMAL HIGH (ref 70–99)
Potassium: 3.8 mmol/L (ref 3.5–5.1)
Sodium: 137 mmol/L (ref 135–145)

## 2022-01-10 LAB — CBC
HCT: 35.7 % — ABNORMAL LOW (ref 36.0–46.0)
Hemoglobin: 11.3 g/dL — ABNORMAL LOW (ref 12.0–15.0)
MCH: 26.4 pg (ref 26.0–34.0)
MCHC: 31.7 g/dL (ref 30.0–36.0)
MCV: 83.4 fL (ref 80.0–100.0)
Platelets: 124 10*3/uL — ABNORMAL LOW (ref 150–400)
RBC: 4.28 MIL/uL (ref 3.87–5.11)
RDW: 15.2 % (ref 11.5–15.5)
WBC: 4 10*3/uL (ref 4.0–10.5)
nRBC: 0 % (ref 0.0–0.2)

## 2022-01-10 LAB — GLUCOSE, CAPILLARY: Glucose-Capillary: 124 mg/dL — ABNORMAL HIGH (ref 70–99)

## 2022-01-10 NOTE — Progress Notes (Addendum)
PCP - Pricilla Handler Cardiologist - Johnsie Cancel with [facility]  PPM/ICD - Denies  Chest x-ray - [facility] EKG - 07/10/21 Stress Test - 06/08/17 Yes cannot remember date "Four years ago, I think" ECHO - Don't remember having this Cardiac Cath - 06/11/17 Yes, done after the stress test, no stent place per patient  Sleep Study - Yes has OSA  CPAP - Nightly  DM - Type II  CBG at PAT appt 124 Fasting Blood Sugar - 89-120 Checks Blood Sugar _daily  Blood Thinner Instructions:Denies Aspirin Instructions:Patient thinks she is suppose to stop four days before, will go back through her paperwork  ERAS Protcol -Yes    COVID TEST- No    Anesthesia review: Yes cardiac history  Patient denies shortness of breath, fever, cough and chest pain at PAT appointment   All instructions explained to the patient, with a verbal understanding of the material. Patient agrees to go over the instructions while at home for a better understanding.  The opportunity to ask questions was provided.

## 2022-01-13 NOTE — Progress Notes (Signed)
Anesthesia Chart Review:  Follows with cardiology for history of HTN, CAD (cath 2019 with CTO of RCA left-to-right collaterals 60% D1 stenosis).  Last seen by Dr. Johnsie Cancel 01/03/2022 and upcoming surgery was discussed.  Per note, "Surgery in January with Dr Brantley Stage  clear to have from cardiac perspective."  Follows with pulmonologist Dr. Halford Chessman for history of COPD and OSA on CPAP.  Maintained on Advair and as needed albuterol.  History of esophageal cancer treated with endoscopic submucosal dissection at [facility].  This was complicated by posttreatment esophageal stricture requiring stent placement which was subsequently removed.  She has continued to require periodic dilation since that time, most recently 09/20/2021.  History of small hiatal hernia.  Non-insulin-dependent DM2, A1c 6.3 on 04/10/2021.  Preop labs reviewed, mild anemia with hemoglobin 11.3, otherwise unremarkable.  EKG 07/10/2021: NSR.  Rate 95.  Cath 06/11/2017:  Chronic total occlusion of the mid RCA collateralized from LAD and circumflex.  Widely patent left main.  30% irregularity in the proximal to mid LAD.  Large diagonal #1 with ostial 60% narrowing and mid vessel diffuse 70% stenosis.  The LAD wraps around the left ventricular apex.  Circumflex is widely patent with luminal irregularities.  Left ventriculography demonstrates inferobasal moderate hypokinesis.  EF 55%.  Normal LVEDP.   RECOMMENDATIONS:    Uptitrate anti-ischemic therapy.  Have prescribed 30 mg of isosorbide mononitrate which in addition to metoprolol can be uptitrated to control symptoms.  If symptom control on medical therapy is unacceptable, she should be referred to our CTO team.    Karoline Caldwell, PA-C [facility] Short Stay Center/Anesthesiology Phone [phone] 01/13/2022 4:21 PM

## 2022-01-13 NOTE — Anesthesia Preprocedure Evaluation (Signed)
Anesthesia Evaluation  Patient identified by MRN, date of birth, ID band Patient awake    Reviewed: Allergy & Precautions, H&P , NPO status , Patient's Chart, lab work & pertinent test results  History of Anesthesia Complications (+) PONV and history of anesthetic complications  Airway Mallampati: II  TM Distance: >3 FB Neck ROM: Full    Dental  (+) Edentulous Upper, Edentulous Lower   Pulmonary sleep apnea and Continuous Positive Airway Pressure Ventilation , COPD,  COPD inhaler, former smoker   Pulmonary exam normal breath sounds clear to auscultation       Cardiovascular hypertension, Pt. on medications + CAD  Normal cardiovascular exam Rhythm:Regular Rate:Normal  cath 2019 with CTO of RCA left-to-right collaterals 60% D1 stenosis   Neuro/Psych negative neurological ROS  negative psych ROS   GI/Hepatic Neg liver ROS,GERD  Controlled and Medicated,, History of esophageal cancer treated with endoscopic submucosal dissection at [facility].  This was complicated by posttreatment esophageal stricture requiring stent placement which was subsequently removed.  She has continued to require periodic dilation since that time, most recently 09/20/2021.    Well controlled GERD, sleeps flat at night without issues   Endo/Other  diabetes, Well Controlled, Type 2, Oral Hypoglycemic Agents    Renal/GU negative Renal ROS  negative genitourinary   Musculoskeletal negative musculoskeletal ROS (+)    Abdominal   Peds negative pediatric ROS (+)  Hematology negative hematology ROS (+)   Anesthesia Other Findings   Reproductive/Obstetrics negative OB ROS                              Anesthesia Physical Anesthesia Plan  ASA: 2  Anesthesia Plan: General   Post-op Pain Management: Tylenol PO (pre-op)*   Induction: Intravenous  PONV Risk Score and Plan: 4 or greater and Ondansetron, Dexamethasone, Midazolam  and Treatment may vary due to age or medical condition  Airway Management Planned: LMA  Additional Equipment: None  Intra-op Plan:   Post-operative Plan: Extubation in OR  Informed Consent: I have reviewed the patients History and Physical, chart, labs and discussed the procedure including the risks, benefits and alternatives for the proposed anesthesia with the patient or authorized representative who has indicated his/her understanding and acceptance.     Dental advisory given  Plan Discussed with: CRNA  Anesthesia Plan Comments:          Anesthesia Quick Evaluation

## 2022-01-14 ENCOUNTER — Ambulatory Visit
Admission: RE | Admit: 2022-01-14 | Discharge: 2022-01-14 | Disposition: A | Discharge status: Home / Self Care | Payer: PPO | Source: Ambulatory Visit | Attending: Surgery | Admitting: Surgery

## 2022-01-14 DIAGNOSIS — R928 Other abnormal and inconclusive findings on diagnostic imaging of breast: Secondary | ICD-10-CM | POA: Diagnosis not present

## 2022-01-14 DIAGNOSIS — D241 Benign neoplasm of right breast: Secondary | ICD-10-CM

## 2022-01-14 HISTORY — PX: BREAST BIOPSY: SHX20

## 2022-01-15 ENCOUNTER — Other Ambulatory Visit: Payer: Self-pay

## 2022-01-15 ENCOUNTER — Encounter (HOSPITAL_COMMUNITY): Payer: Self-pay | Admitting: Surgery

## 2022-01-15 ENCOUNTER — Ambulatory Visit (HOSPITAL_COMMUNITY)
Admission: RE | Admit: 2022-01-15 | Discharge: 2022-01-15 | Disposition: A | Discharge status: Home / Self Care | Payer: PPO | Attending: Surgery | Admitting: Surgery

## 2022-01-15 ENCOUNTER — Ambulatory Visit (HOSPITAL_COMMUNITY): Payer: PPO | Admitting: Physician Assistant

## 2022-01-15 ENCOUNTER — Encounter (HOSPITAL_COMMUNITY)
Admission: RE | Disposition: A | Discharge status: Home / Self Care | Payer: Self-pay | Source: Home / Self Care | Attending: Surgery

## 2022-01-15 ENCOUNTER — Ambulatory Visit
Admission: RE | Admit: 2022-01-15 | Discharge: 2022-01-15 | Disposition: A | Discharge status: Home / Self Care | Payer: Medicare HMO | Source: Ambulatory Visit | Attending: Surgery | Admitting: Surgery

## 2022-01-15 ENCOUNTER — Ambulatory Visit (HOSPITAL_BASED_OUTPATIENT_CLINIC_OR_DEPARTMENT_OTHER): Payer: PPO | Admitting: Physician Assistant

## 2022-01-15 DIAGNOSIS — K219 Gastro-esophageal reflux disease without esophagitis: Secondary | ICD-10-CM | POA: Insufficient documentation

## 2022-01-15 DIAGNOSIS — G4730 Sleep apnea, unspecified: Secondary | ICD-10-CM | POA: Insufficient documentation

## 2022-01-15 DIAGNOSIS — I10 Essential (primary) hypertension: Secondary | ICD-10-CM | POA: Diagnosis not present

## 2022-01-15 DIAGNOSIS — Z87891 Personal history of nicotine dependence: Secondary | ICD-10-CM | POA: Insufficient documentation

## 2022-01-15 DIAGNOSIS — Z803 Family history of malignant neoplasm of breast: Secondary | ICD-10-CM | POA: Insufficient documentation

## 2022-01-15 DIAGNOSIS — D241 Benign neoplasm of right breast: Secondary | ICD-10-CM | POA: Diagnosis not present

## 2022-01-15 DIAGNOSIS — E119 Type 2 diabetes mellitus without complications: Secondary | ICD-10-CM | POA: Diagnosis not present

## 2022-01-15 DIAGNOSIS — R920 Mammographic microcalcification found on diagnostic imaging of breast: Secondary | ICD-10-CM | POA: Diagnosis not present

## 2022-01-15 DIAGNOSIS — I2510 Atherosclerotic heart disease of native coronary artery without angina pectoris: Secondary | ICD-10-CM | POA: Insufficient documentation

## 2022-01-15 DIAGNOSIS — Z7984 Long term (current) use of oral hypoglycemic drugs: Secondary | ICD-10-CM | POA: Insufficient documentation

## 2022-01-15 DIAGNOSIS — Z800 Family history of malignant neoplasm of digestive organs: Secondary | ICD-10-CM | POA: Insufficient documentation

## 2022-01-15 DIAGNOSIS — N62 Hypertrophy of breast: Secondary | ICD-10-CM | POA: Diagnosis not present

## 2022-01-15 DIAGNOSIS — J449 Chronic obstructive pulmonary disease, unspecified: Secondary | ICD-10-CM | POA: Insufficient documentation

## 2022-01-15 DIAGNOSIS — R928 Other abnormal and inconclusive findings on diagnostic imaging of breast: Secondary | ICD-10-CM | POA: Diagnosis not present

## 2022-01-15 HISTORY — PX: BREAST LUMPECTOMY WITH RADIOACTIVE SEED LOCALIZATION: SHX6424

## 2022-01-15 LAB — GLUCOSE, CAPILLARY
Glucose-Capillary: 182 mg/dL — ABNORMAL HIGH (ref 70–99)
Glucose-Capillary: 164 mg/dL — ABNORMAL HIGH (ref 70–99)
Glucose-Capillary: 156 mg/dL — ABNORMAL HIGH (ref 70–99)
Glucose-Capillary: 147 mg/dL — ABNORMAL HIGH (ref 70–99)

## 2022-01-15 SURGERY — BREAST LUMPECTOMY WITH RADIOACTIVE SEED LOCALIZATION
Anesthesia: General | Site: Breast | Laterality: Right

## 2022-01-15 MED ORDER — OXYCODONE HCL 5 MG PO TABS: 5.0000 mg | ORAL_TABLET | Freq: Once | ORAL | Status: DC | PRN

## 2022-01-15 MED ORDER — OXYCODONE HCL 5 MG/5ML PO SOLN: 5.0000 mg | Freq: Once | ORAL | Status: DC | PRN

## 2022-01-15 MED ORDER — 0.9 % SODIUM CHLORIDE (POUR BTL) OPTIME
TOPICAL | Status: DC | PRN
  Administered 2022-01-15: 1000 mL

## 2022-01-15 MED ORDER — PHENYLEPHRINE 80 MCG/ML (10ML) SYRINGE FOR IV PUSH (FOR BLOOD PRESSURE SUPPORT)
PREFILLED_SYRINGE | INTRAVENOUS | Status: DC | PRN
  Administered 2022-01-15 (×4): 80 ug via INTRAVENOUS

## 2022-01-15 MED ORDER — BUPIVACAINE-EPINEPHRINE 0.25% -1:200000 IJ SOLN
INTRAMUSCULAR | Status: DC | PRN
  Administered 2022-01-15: 19 mL

## 2022-01-15 MED ORDER — DEXAMETHASONE SODIUM PHOSPHATE 10 MG/ML IJ SOLN
INTRAMUSCULAR | Status: AC
  Filled 2022-01-15: qty 1

## 2022-01-15 MED ORDER — AMISULPRIDE (ANTIEMETIC) 5 MG/2ML IV SOLN
INTRAVENOUS | Status: AC
  Filled 2022-01-15: qty 4

## 2022-01-15 MED ORDER — MIDAZOLAM HCL 2 MG/2ML IJ SOLN
INTRAMUSCULAR | Status: AC
  Filled 2022-01-15: qty 2

## 2022-01-15 MED ORDER — ONDANSETRON HCL 4 MG/2ML IJ SOLN
INTRAMUSCULAR | Status: AC
  Filled 2022-01-15: qty 2

## 2022-01-15 MED ORDER — MIDAZOLAM HCL 2 MG/2ML IJ SOLN
INTRAMUSCULAR | Status: DC | PRN
  Administered 2022-01-15: 2 mg via INTRAVENOUS

## 2022-01-15 MED ORDER — BUPIVACAINE HCL (PF) 0.25 % IJ SOLN
INTRAMUSCULAR | Status: AC
  Filled 2022-01-15: qty 30

## 2022-01-15 MED ORDER — ACETAMINOPHEN 500 MG PO TABS
1000.0000 mg | ORAL_TABLET | Freq: Once | ORAL | Status: AC
  Filled 2022-01-15: qty 2

## 2022-01-15 MED ORDER — PROPOFOL 10 MG/ML IV BOLUS
INTRAVENOUS | Status: DC | PRN
  Administered 2022-01-15: 100 mg via INTRAVENOUS

## 2022-01-15 MED ORDER — OXYCODONE HCL 5 MG PO TABS: 5.0000 mg | ORAL_TABLET | Freq: Four times a day (QID) | ORAL | 0 refills | Status: DC | PRN

## 2022-01-15 MED ORDER — CHLORHEXIDINE GLUCONATE 0.12 % MT SOLN
15.0000 mL | Freq: Once | OROMUCOSAL | Status: AC
  Filled 2022-01-15: qty 15

## 2022-01-15 MED ORDER — ONDANSETRON HCL 4 MG/2ML IJ SOLN: 4.0000 mg | Freq: Once | INTRAMUSCULAR | Status: DC | PRN

## 2022-01-15 MED ORDER — LACTATED RINGERS IV SOLN: INTRAVENOUS | Status: DC

## 2022-01-15 MED ORDER — ONDANSETRON HCL 4 MG/2ML IJ SOLN
INTRAMUSCULAR | Status: DC | PRN
  Administered 2022-01-15: 4 mg via INTRAVENOUS

## 2022-01-15 MED ORDER — PROPOFOL 10 MG/ML IV BOLUS
INTRAVENOUS | Status: AC
  Filled 2022-01-15: qty 20

## 2022-01-15 MED ORDER — CHLORHEXIDINE GLUCONATE CLOTH 2 % EX PADS: 6.0000 | MEDICATED_PAD | Freq: Once | CUTANEOUS | Status: DC

## 2022-01-15 MED ORDER — FENTANYL CITRATE (PF) 250 MCG/5ML IJ SOLN
INTRAMUSCULAR | Status: AC
  Filled 2022-01-15: qty 5

## 2022-01-15 MED ORDER — ORAL CARE MOUTH RINSE: 15.0000 mL | Freq: Once | OROMUCOSAL | Status: AC

## 2022-01-15 MED ORDER — INSULIN ASPART 100 UNIT/ML IJ SOLN: 0.0000 [IU] | INTRAMUSCULAR | Status: DC | PRN

## 2022-01-15 MED ORDER — GABAPENTIN 300 MG PO CAPS
300.0000 mg | ORAL_CAPSULE | ORAL | Status: AC
  Administered 2022-01-15: 300 mg via ORAL
  Filled 2022-01-15: qty 1

## 2022-01-15 MED ORDER — ACETAMINOPHEN 500 MG PO TABS
1000.0000 mg | ORAL_TABLET | ORAL | Status: AC
  Administered 2022-01-15: 1000 mg via ORAL

## 2022-01-15 MED ORDER — AMISULPRIDE (ANTIEMETIC) 5 MG/2ML IV SOLN
10.0000 mg | Freq: Once | INTRAVENOUS | Status: AC | PRN
  Administered 2022-01-15: 10 mg via INTRAVENOUS

## 2022-01-15 MED ORDER — CEFAZOLIN SODIUM-DEXTROSE 2-4 GM/100ML-% IV SOLN
2.0000 g | INTRAVENOUS | Status: AC
  Administered 2022-01-15: 2 g via INTRAVENOUS
  Filled 2022-01-15: qty 100

## 2022-01-15 MED ORDER — FENTANYL CITRATE (PF) 250 MCG/5ML IJ SOLN
INTRAMUSCULAR | Status: DC | PRN
  Administered 2022-01-15: 50 ug via INTRAVENOUS

## 2022-01-15 MED ORDER — DEXAMETHASONE SODIUM PHOSPHATE 10 MG/ML IJ SOLN
INTRAMUSCULAR | Status: DC | PRN
  Administered 2022-01-15: 5 mg via INTRAVENOUS

## 2022-01-15 MED ORDER — LIDOCAINE 2% (20 MG/ML) 5 ML SYRINGE
INTRAMUSCULAR | Status: DC | PRN
  Administered 2022-01-15: 60 mg via INTRAVENOUS

## 2022-01-15 MED ORDER — CHLORHEXIDINE GLUCONATE 0.12 % MT SOLN
15.0000 mL | Freq: Once | OROMUCOSAL | Status: AC
  Administered 2022-01-15: 15 mL via OROMUCOSAL

## 2022-01-15 MED ORDER — HYDROMORPHONE HCL 1 MG/ML IJ SOLN: 0.2500 mg | INTRAMUSCULAR | Status: DC | PRN

## 2022-01-15 MED ORDER — LIDOCAINE 2% (20 MG/ML) 5 ML SYRINGE
INTRAMUSCULAR | Status: AC
  Filled 2022-01-15: qty 5

## 2022-01-15 MED ORDER — EPINEPHRINE PF 1 MG/ML IJ SOLN
INTRAMUSCULAR | Status: AC
  Filled 2022-01-15: qty 1

## 2022-01-15 SURGICAL SUPPLY — 37 items
APPLIER CLIP 9.375 MED OPEN (MISCELLANEOUS)
BAG COUNTER SPONGE SURGICOUNT (BAG) ×1 IMPLANT
BINDER BREAST LRG (GAUZE/BANDAGES/DRESSINGS) IMPLANT
BINDER BREAST XLRG (GAUZE/BANDAGES/DRESSINGS) IMPLANT
CANISTER SUCT 3000ML PPV (MISCELLANEOUS) IMPLANT
CHLORAPREP W/TINT 26 (MISCELLANEOUS) ×1 IMPLANT
CLIP APPLIE 9.375 MED OPEN (MISCELLANEOUS) IMPLANT
COVER PROBE W GEL 5X96 (DRAPES) ×1 IMPLANT
COVER SURGICAL LIGHT HANDLE (MISCELLANEOUS) ×1 IMPLANT
DERMABOND ADVANCED .7 DNX12 (GAUZE/BANDAGES/DRESSINGS) ×1 IMPLANT
DEVICE DUBIN SPECIMEN MAMMOGRA (MISCELLANEOUS) ×1 IMPLANT
DRAPE CHEST BREAST 15X10 FENES (DRAPES) ×1 IMPLANT
ELECT CAUTERY BLADE 6.4 (BLADE) ×1 IMPLANT
ELECT REM PT RETURN 9FT ADLT (ELECTROSURGICAL) ×1
ELECTRODE REM PT RTRN 9FT ADLT (ELECTROSURGICAL) ×1 IMPLANT
GLOVE BIO SURGEON STRL SZ8 (GLOVE) ×1 IMPLANT
GLOVE BIOGEL PI IND STRL 8 (GLOVE) ×1 IMPLANT
GOWN STRL REUS W/ TWL LRG LVL3 (GOWN DISPOSABLE) ×1 IMPLANT
GOWN STRL REUS W/ TWL XL LVL3 (GOWN DISPOSABLE) ×1 IMPLANT
GOWN STRL REUS W/TWL LRG LVL3 (GOWN DISPOSABLE) ×1
GOWN STRL REUS W/TWL XL LVL3 (GOWN DISPOSABLE) ×1
KIT BASIN OR (CUSTOM PROCEDURE TRAY) ×1 IMPLANT
KIT MARKER MARGIN INK (KITS) IMPLANT
LIGHT WAVEGUIDE WIDE FLAT (MISCELLANEOUS) IMPLANT
NDL HYPO 25GX1X1/2 BEV (NEEDLE) IMPLANT
NEEDLE HYPO 25GX1X1/2 BEV (NEEDLE) IMPLANT
NS IRRIG 1000ML POUR BTL (IV SOLUTION) IMPLANT
PACK GENERAL/GYN (CUSTOM PROCEDURE TRAY) ×1 IMPLANT
SUT MNCRL AB 4-0 PS2 18 (SUTURE) ×1 IMPLANT
SUT SILK 2 0 SH (SUTURE) IMPLANT
SUT VIC AB 2-0 SH 27 (SUTURE)
SUT VIC AB 2-0 SH 27XBRD (SUTURE) IMPLANT
SUT VIC AB 3-0 SH 27 (SUTURE)
SUT VIC AB 3-0 SH 27X BRD (SUTURE) IMPLANT
SUT VIC AB 3-0 SH 8-18 (SUTURE) ×1 IMPLANT
SYR CONTROL 10ML LL (SYRINGE) IMPLANT
TOWEL GREEN STERILE FF (TOWEL DISPOSABLE) IMPLANT

## 2022-01-15 NOTE — Interval H&P Note (Signed)
History and Physical Interval Note:  01/15/2022 10:13 AM  Erica Braun  has presented today for surgery, with the diagnosis of RIGHT BREAST MASS.  The various methods of treatment have been discussed with the patient and family. After consideration of risks, benefits and other options for treatment, the patient has consented to  Procedure(s): RIGHT BREAST LUMPECTOMY WITH RADIOACTIVE SEED LOCALIZATION (Right) as a surgical intervention.  The patient's history has been reviewed, patient examined, no change in status, stable for surgery.  I have reviewed the patient's chart and labs.  Questions were answered to the patient's satisfaction.     Kieler

## 2022-01-15 NOTE — Anesthesia Postprocedure Evaluation (Signed)
Anesthesia Post Note  Patient: Erica Braun  Procedure(s) Performed: RIGHT BREAST LUMPECTOMY WITH RADIOACTIVE SEED LOCALIZATION (Right: Breast)     Patient location during evaluation: PACU Anesthesia Type: General Level of consciousness: awake and alert, oriented and patient cooperative Pain management: pain level controlled Vital Signs Assessment: post-procedure vital signs reviewed and stable Respiratory status: spontaneous breathing, nonlabored ventilation and respiratory function stable Cardiovascular status: blood pressure returned to baseline and stable Postop Assessment: no apparent nausea or vomiting Anesthetic complications: no   No notable events documented.  Last Vitals:  Vitals:   01/15/22 1225 01/15/22 1240  BP: (!) 123/54 128/60  Pulse: 70 69  Resp: 11 15  Temp:  36.5 C  SpO2: 97% 100%    Last Pain:  Vitals:   01/15/22 1240  PainSc: 0-No pain                 Pervis Hocking

## 2022-01-15 NOTE — Anesthesia Procedure Notes (Addendum)
Procedure Name: LMA Insertion Date/Time: 01/15/2022 11:13 AM  Performed by: Michele Rockers, CRNAPre-anesthesia Checklist: Patient identified, Emergency Drugs available, Suction available, Timeout performed and Patient being monitored Patient Re-evaluated:Patient Re-evaluated prior to induction Oxygen Delivery Method: Circle system utilized Preoxygenation: Pre-oxygenation with 100% oxygen Induction Type: IV induction Ventilation: Mask ventilation without difficulty LMA: LMA inserted LMA Size: 4.0 Number of attempts: 1 Placement Confirmation: breath sounds checked- equal and bilateral and positive ETCO2 Tube secured with: Tape Dental Injury: Teeth and Oropharynx as per pre-operative assessment

## 2022-01-15 NOTE — Discharge Instructions (Signed)
[facility] Office Phone Number [phone]  BREAST BIOPSY/ PARTIAL MASTECTOMY: POST OP INSTRUCTIONS  Always review your discharge instruction sheet given to you by the facility where your surgery was performed.  IF YOU HAVE DISABILITY OR FAMILY LEAVE FORMS, YOU MUST BRING THEM TO THE OFFICE FOR PROCESSING.  DO NOT GIVE THEM TO YOUR DOCTOR.  A prescription for pain medication may be given to you upon discharge.  Take your pain medication as prescribed, if needed.  If narcotic pain medicine is not needed, then you may take acetaminophen (Tylenol) or ibuprofen (Advil) as needed. Take your usually prescribed medications unless otherwise directed If you need a refill on your pain medication, please contact your pharmacy.  They will contact our office to request authorization.  Prescriptions will not be filled after 5pm or on week-ends. You should eat very light the first 24 hours after surgery, such as soup, crackers, pudding, etc.  Resume your normal diet the day after surgery. Most patients will experience some swelling and bruising in the breast.  Ice packs and a good support bra will help.  Swelling and bruising can take several days to resolve.  It is common to experience some constipation if taking pain medication after surgery.  Increasing fluid intake and taking a stool softener will usually help or prevent this problem from occurring.  A mild laxative (Milk of Magnesia or Miralax) should be taken according to package directions if there are no bowel movements after 48 hours. Unless discharge instructions indicate otherwise, you may remove your bandages 24-48 hours after surgery, and you may shower at that time.  You may have steri-strips (small skin tapes) in place directly over the incision.  These strips should be left on the skin for 7-10 days.  If your surgeon used skin glue on the incision, you may shower in 24 hours.  The glue will flake off over the next 2-3 weeks.  Any  sutures or staples will be removed at the office during your follow-up visit. ACTIVITIES:  You may resume regular daily activities (gradually increasing) beginning the next day.  Wearing a good support bra or sports bra minimizes pain and swelling.  You may have sexual intercourse when it is comfortable. You may drive when you no longer are taking prescription pain medication, you can comfortably wear a seatbelt, and you can safely maneuver your car and apply brakes. RETURN TO WORK:  ______________________________________________________________________________________ You should see your doctor in the office for a follow-up appointment approximately two weeks after your surgery.  Your doctor's nurse will typically make your follow-up appointment when she calls you with your pathology report.  Expect your pathology report 2-3 business days after your surgery.  You may call to check if you do not hear from us after three days. OTHER INSTRUCTIONS: _______________________________________________________________________________________________ _____________________________________________________________________________________________________________________________________ _____________________________________________________________________________________________________________________________________ _____________________________________________________________________________________________________________________________________  WHEN TO CALL YOUR DOCTOR: Fever over 101.0 Nausea and/or vomiting. Extreme swelling or bruising. Continued bleeding from incision. Increased pain, redness, or drainage from the incision.  The clinic staff is available to answer your questions during regular business hours.  Please don't hesitate to call and ask to speak to one of the nurses for clinical concerns.  If you have a medical emergency, go to the nearest emergency room or call 911.  A surgeon from [facility] is always on call at the hospital.  For further questions, please visit [email or web address]   

## 2022-01-15 NOTE — Op Note (Signed)
Preoperative diagnosis: Right breast papilloma sclerosis  Postoperative diagnosis: Same  Procedure: Right breast seed localized lumpectomy  Surgeon: Erroll Luna, MD  Anesthesia: LMA with 0.25% Marcaine plain local  EBL: Minimal  Specimen: Right breast tissue seed clip verified by imaging  Drains: None  Indications for procedure: The patient is a 72 year old female with an abnormal mammogram.  She was found to have a density on the right.  Core biopsy showed sclerosed papilloma.  We discussed options of observation versus removal.  We discussed potential upgrade risk as well.  Discussion but she wished to proceed with right breast seed lumpectomy.The procedure has been discussed with the patient. Alternatives to surgery have been discussed with the patient.  Risks of surgery include bleeding,  Infection,  Seroma formation, death,  and the need for further surgery.   The patient understands and wishes to proceed.     Description of procedure: The patient was met in the holding area.  Right breast was marked as correct site.  All questions were answered.  Of note she was placed as an outpatient.  She was then taken back to the operative room.  She was placed supine upon the OR table.  After induction of general esthesia, the right breast was prepped and draped in sterile fashion timeout performed.  Films were available for location.  Proper patient, site and procedure verified.  Neoprobe was used to identify the seed along the nipple areolar border between 9 and 12.  A curvilinear incision was made along this.  Dissection was carried in all tissue around the seed and clip were excised with a grossly negative margin.  Hemostasis achieved with cautery. Imaging showed the seed clip to be in the specimen.  Local anesthetic was infiltrated.  Deep tissue layers were closed with 3-0 Vicryl.  4 Monocryl used to close the skin.  Dermabond applied.  Breast binder placed.  All counts found to be correct.   The patient was awoke extubated taken recovery in satisfactory condition.

## 2022-01-15 NOTE — H&P (Signed)
History of Present Illness: Erica Braun is a 72 y.o. female who is seen today as an office consultation for evaluation of Breast Problem .  Patient presents for evaluation of right breast abnormal mammogram. She had a 1.3 cm retroareolar mass right breast with calcifications. Core biopsy showed sclerosing papilloma. She has 2 first-degree relatives of breast cancer in her 56s and 42s. Both are deceased. She has never had genetic testing. Denies any history of breast pain nipple discharge or breast mass.  Review of Systems: A complete review of systems was obtained from the patient. I have reviewed this information and discussed as appropriate with the patient. See HPI as well for other ROS.    Medical History: Past Medical History: Diagnosis Date BCC (basal cell carcinoma of skin) nose, vagina CAD (coronary artery disease) s/p heart cath 06/2017 COPD (chronic obstructive pulmonary disease) (CMS-HCC) DM2 (diabetes mellitus, type 2) (CMS-HCC) Dysphagia GERD (gastroesophageal reflux disease) Hypertension SCC (squamous cell carcinoma) Sleep apnea  Patient Active Problem List Diagnosis Esophageal stenosis COPD (chronic obstructive pulmonary disease) (CMS-HCC) Type 2 diabetes mellitus without complication, without long-term current use of insulin (CMS-HCC) Essential hypertension Esophageal dysphagia  Past Surgical History: Procedure Laterality Date ESOPHAGOGASTRODOUDENOSCOPY W/TRANSENDOSCOPIC STENT PLACEMENT N/A 07/29/2018 Procedure: EGD; Surgeon: Narda Bonds, MD; Location: [facility]; Service: Gastroenterology; Laterality: N/A; INTRALUMINAL DILATION RADIOLOGICAL SUPERVISION/INTERPRETATION 07/29/2018 Procedure: INTRALUMINAL DILATION OF STRICTURES AND/OR OBSTRUCTIONS (EG, ESOPHAGUS), RADIOLOGICAL SUPERVISION AND INTERPRETATION; Surgeon: Narda Bonds, MD; Location: [facility]; Service: Gastroenterology;; ESOPHAGOSCOPY W/REMOVAL FOREIGN BODY N/A  09/16/2018 Procedure: EGD; Surgeon: Narda Bonds, MD; Location: [facility]; Service: Gastroenterology; Laterality: N/A; stent removal ESOPHAGOSCOPY W/BALLOON DILATION N/A 09/16/2018 Procedure: ESOPHAGOSCOPY, FLEXIBLE, TRANSORAL; WITH TRANSENDOSCOPIC BALLOON DILATION (LESS THAN 30 MM DIAMETER); Surgeon: Narda Bonds, MD; Location: [facility]; Service: Gastroenterology; Laterality: N/A; INTRALUMINAL DILATION RADIOLOGICAL SUPERVISION/INTERPRETATION 10/14/2018 Procedure: INTRALUMINAL DILATION OF STRICTURES AND/OR OBSTRUCTIONS (EG, ESOPHAGUS), RADIOLOGICAL SUPERVISION AND INTERPRETATION; Surgeon: Narda Bonds, MD; Location: [facility]; Service: Gastroenterology;; [name] W/TRANSENDOSCOPIC STENT PLACEMENT 10/14/2018 Procedure: ESOPHAGOGASTRODUODENOSCOPY, FLEXIBLE, TRANSORAL; WITH PLACEMENT OF ENDOSCOPIC STENT (INCLUDES PRE- AND POST-DILATION AND GUIDE WIREPASSAGE, WHEN PERFORMED); Surgeon: Narda Bonds, MD; Location: [facility]; Service: Gastroenterology;; [name] W/DILATION N/A 11/22/2018 Procedure: EGD; Surgeon: Sherri Rad, MD; Location: [facility]; Service: Gastroenterology; Laterality: N/A; ESOPHAGOGASTRODOUDENOSCOPY W/REMOVAL FOREIGN BODY 11/22/2018 Procedure: ESOPHAGOGASTRODUODENOSCOPY, FLEXIBLE, TRANSORAL; WITH REMOVAL OF FOREIGN BODY(S); Surgeon: Sherri Rad, MD; Location: [facility]; Service: Gastroenterology;; stent removal ESOPHAGOGASTRODOUDENOSCOPY W/INJECTION 11/22/2018 Procedure: ESOPHAGOGASTRODUODENOSCOPY, FLEXIBLE, TRANSORAL; WITH DIRECTED SUBMUCOSAL INJECTION(S), ANY SUBSTANCE; Surgeon: Sherri Rad, MD; Location: [facility]; Service: Gastroenterology;; [name] W/DILATION N/A 12/14/2018 Procedure: ESOPHAGOGASTRODUODENOSCOPY, FLEXIBLE, TRANSORAL; WITH TRANSENDOSCOPIC BALLOON DILATION OF ESOPHAGUS (LESS THAN  30 MM DIAMETER); Surgeon: Sherri Rad, MD; Location: [facility]; Service: Gastroenterology; Laterality: N/A; INTRALUMINAL DILATION RADIOLOGICAL SUPERVISION/INTERPRETATION 12/14/2018 Procedure: INTRALUMINAL DILATION OF STRICTURES AND/OR OBSTRUCTIONS (EG, ESOPHAGUS), RADIOLOGICAL SUPERVISION AND INTERPRETATION; Surgeon: Sherri Rad, MD; Location: [facility]; Service: Gastroenterology;; [name] W/INJECTION N/A 12/14/2018 Procedure: ESOPHAGOGASTRODUODENOSCOPY, FLEXIBLE, TRANSORAL; WITH DIRECTED SUBMUCOSAL INJECTION(S), ANY SUBSTANCE; Surgeon: Sherri Rad, MD; Location: [facility]; Service: Gastroenterology; Laterality: N/A; [name] W/INJECTION N/A 12/28/2018 Procedure: THERAPEUTIC EGD; Surgeon: Sherri Rad, MD; Location: [facility]; Service: Gastroenterology; Laterality: N/A; ESOPHAGOGASTRODOUDENOSCOPY W/DILATION N/A 12/28/2018 Procedure: ESOPHAGOGASTRODUODENOSCOPY, FLEXIBLE, TRANSORAL; WITH TRANSENDOSCOPIC BALLOON DILATION OF ESOPHAGUS (LESS THAN 30 MM DIAMETER); Surgeon: Sherri Rad, MD; Location: [facility]; Service: Gastroenterology; Laterality: N/A; INTRALUMINAL DILATION RADIOLOGICAL SUPERVISION/INTERPRETATION 12/28/2018 Procedure: INTRALUMINAL DILATION OF STRICTURES AND/OR OBSTRUCTIONS (EG, ESOPHAGUS), RADIOLOGICAL SUPERVISION  AND INTERPRETATION; Surgeon: Sherri Rad, MD; Location: [facility]; Service: Gastroenterology;; [name] W/BIOPSY N/A 02/03/2019 Procedure: DIAGNOSTIC ESOPHAGOGASTRODUODENOSCOPY; Surgeon: Sherri Rad, MD; Location: [facility]; Service: Gastroenterology; Laterality: N/A; ESOPHAGOGASTRODOUDENOSCOPY W/INJECTION N/A 02/03/2019 Procedure: ESOPHAGOGASTRODUODENOSCOPY, FLEXIBLE, TRANSORAL; WITH DIRECTED SUBMUCOSAL INJECTION(S), ANY SUBSTANCE; Surgeon: Sherri Rad, MD; Location: [facility]; Service: Gastroenterology; Laterality: N/A; INTRALUMINAL DILATION RADIOLOGICAL SUPERVISION/INTERPRETATION 02/03/2019 Procedure: INTRALUMINAL DILATION OF STRICTURES AND/OR OBSTRUCTIONS (EG, ESOPHAGUS), RADIOLOGICAL SUPERVISION AND INTERPRETATION; Surgeon: Sherri Rad, MD; Location: [facility]; Service: Gastroenterology;; [name] W/DILATION N/A 02/03/2019 Procedure: ESOPHAGOGASTRODUODENOSCOPY, FLEXIBLE, TRANSORAL; WITH TRANSENDOSCOPIC BALLOON DILATION OF ESOPHAGUS (LESS THAN 30 MM DIAMETER); Surgeon: Sherri Rad, MD; Location: [facility]; Service: Gastroenterology; Laterality: N/A; [name] W/INJECTION N/A 04/28/2019 Procedure: EGD with Dilation; Surgeon: Sherri Rad, MD; Location: [facility]; Service: Gastroenterology; Laterality: N/A; Schedule in room 8 ONLY KENA[place] '50MG'$  ESOPHAGOGASTRODOUDENOSCOPY W/DILATION N/A 04/28/2019 Procedure: ESOPHAGOGASTRODUODENOSCOPY, FLEXIBLE, TRANSORAL; WITH TRANSENDOSCOPIC BALLOON DILATION OF ESOPHAGUS (LESS THAN 30 MM DIAMETER); Surgeon: Sherri Rad, MD; Location: [facility]; Service: Gastroenterology; Laterality: N/A; ESOPHAGOGASTRODOUDENOSCOPY W/DILATION N/A 06/09/2019 Procedure: EGD with Dilation; Surgeon: Sherri Rad, MD; Location: [facility]; Service: Gastroenterology; Laterality: N/A; ESOPHAGOGASTRODOUDENOSCOPY W/INJECTION N/A 06/09/2019 Procedure: ESOPHAGOGASTRODUODENOSCOPY, FLEXIBLE, TRANSORAL; WITH DIRECTED SUBMUCOSAL INJECTION(S), ANY SUBSTANCE; Surgeon: Sherri Rad, MD; Location: [facility]; Service: Gastroenterology; Laterality: N/A; INTRALUMINAL DILATION RADIOLOGICAL SUPERVISION/INTERPRETATION 06/09/2019 Procedure: INTRALUMINAL DILATION OF STRICTURES AND/OR OBSTRUCTIONS (EG, ESOPHAGUS), RADIOLOGICAL SUPERVISION AND INTERPRETATION; Surgeon:  Sherri Rad, MD; Location: [facility]; Service: Gastroenterology;; [name] W/DILATION N/A 07/22/2019 Procedure: EGD with Dilation; Surgeon: Raynelle Jan., MD; Location: [place]; Service: Gastroenterology; Laterality: N/A; ESOPHAGOGASTRODOUDENOSCOPY W/BIOPSY N/A 10/17/2019 Procedure: ESOPHAGOGASTRODUODENOSCOPY, FLEXIBLE, TRANSORAL; WITH BIOPSY, SINGLE OR MULTIPLE; Surgeon: Irven Baltimore, MD; Location: [place]; Service: Gastroenterology; Laterality: N/A; ESOPHAGOGASTRODOUDENOSCOPY W/DILATION N/A 10/17/2019 Procedure: ESOPHAGOGASTRODUODENOSCOPY, FLEXIBLE, TRANSORAL; WITH TRANSENDOSCOPIC BALLOON DILATION OF ESOPHAGUS (LESS THAN 30 MM DIAMETER); Surgeon: Irven Baltimore, MD; Location: [place]; Service: Gastroenterology; Laterality: N/A; ESOPHAGOGASTRODOUDENOSCOPY W/DILATION N/A 11/15/2019 Procedure: EGD with Dilation; Surgeon: Irven Baltimore, MD; Location: [place]; Service: Gastroenterology; Laterality: N/A; ESOPHAGOGASTRODOUDENOSCOPY W/DILATION N/A 02/21/2020 Procedure: DIAGNOSTIC EGD; Surgeon: Irven Baltimore, MD; Location: [place]; Service: Gastroenterology; Laterality: N/A; EGD N/A 01/16/2021 Procedure: EGD - Upper Endoscopy; Surgeon: Michael Boston, MD; Location: [place]; Service: Gastroenterology; Laterality: N/A; BREAST EXCISIONAL BIOPSY COLONOSCOPY EGD HYSTERECTOMY VAGINAL   No Known Allergies  Current Outpatient Medications on File Prior to Visit Medication Sig Dispense Refill albuterol 90 mcg/actuation inhaler Inhale 2 inhalations into the lungs every 4 (four) hours as needed for Wheezing Has not used in months ascorbic acid (VITAMIN C ORAL) Take by mouth once daily aspirin 81 MG chewable tablet Take 1 tablet (81 mg total) by mouth once daily atorvastatin (LIPITOR) 40 MG tablet Take 1 tablet (40 mg total) by mouth at  bedtime blood glucose diagnostic test strip test BS BID Dx E11.9 cholecalciferol (VITAMIN D3) 2,000 unit tablet Take 1 tablet (2,000 Units total) by mouth once daily CYANOCOBALAMIN, VITAMIN B-12, ORAL Take 1 tablet by mouth once daily famotidine (PEPCID) 40 MG tablet Take 1 tablet (40 mg total) by mouth at bedtime glimepiride (AMARYL) 2 MG tablet Take 1 tablet (2 mg total) by mouth daily with breakfast Taking 1 mg most mornings - based on blood surgars iron,carbonyl (IRON CHEWS ORAL) Take by mouth once daily (Patient not taking: Reported on 12/25/2020) isosorbide mononitrate (IMDUR) 30 MG ER tablet Take 30 mg by mouth every morning lancets Test  BS BID Dx E11.9 losartan (COZAAR) 25 MG tablet Take 1 tablet (25 mg total) by mouth every morning Liquid form metFORMIN (GLUCOPHAGE) 1000 MG tablet Take 1 tablet (1,000 mg total) by mouth 2 (two) times daily with meals metoprolol tartrate (LOPRESSOR) 50 MG tablet Take 1 tablet (50 mg total) by mouth 2 (two) times daily mv-min/iron/folic/calcium/vitK (WOMEN'S MULTIVITAMIN ORAL) Take 1 tablet by mouth once daily nitroGLYcerin (NITROSTAT) 0.4 MG SL tablet DISSOLVE ONE TABLET UNDER THE TONGUE EVERY 5 MINUTES AS NEEDED FOR CHEST PAIN. DO NOT EXCEED A TOTAL OF 3 DOSES IN 15 MINUTES omeprazole (PRILOSEC) 20 MG DR capsule Take 1 capsule (20 mg total) by mouth once daily ondansetron (ZOFRAN) 4 mg/5 mL solution Take 5 mLs (4 mg total) by mouth every 8 (eight) hours as needed for Nausea ONETOUCH ULTRA BLUE TEST STRIP test strip USE 1 STRIP TO CHECK GLUCOSE TWICE DAILY ONETOUCH ULTRA2 METER Misc USE 1 TO CHECK GLUCOSE TWICE DAILY AND AS NEEDED ONETOUCH ULTRASOFT lancets USE 1 TO CHECK GLUCOSE TWICE DAILY umeclidinium-vilanteroL (ANORO ELLIPTA) 62.5-25 mcg/actuation inhaler Inhale 1 inhalation into the lungs every morning  No current facility-administered medications on file prior to visit.  Family History Problem Relation Age of Onset Liver cancer Maternal  Grandmother Anesthesia problems Neg Hx   Social History  Tobacco Use Smoking Status Former Packs/day: 2.00 Years: 40.00 Additional pack years: 0.00 Total pack years: 80.00 Types: Cigarettes Quit date: 2010 Years since quitting: 13.9 Smokeless Tobacco Never   Social History  Socioeconomic History Marital status: Married Spouse name: john Number of children: 0 Occupational History Occupation: retired Advertising account planner Tobacco Use Smoking status: Former Packs/day: 2.00 Years: 40.00 Additional pack years: 0.00 Total pack years: 80.00 Types: Cigarettes Quit date: 2010 Years since quitting: 13.9 Smokeless tobacco: Never Vaping Use Vaping Use: Never used Substance and Sexual Activity Alcohol use: Not Currently Drug use: Never Sexual activity: Defer  Objective:  Vitals: 12/16/21 1343 BP: 128/72 Pulse: 93 Weight: 61.6 kg (135 lb 12.8 oz) Height: 154.9 cm ('5\' 1"'$ )  Body mass index is 25.66 kg/m.  Physical Exam Exam conducted with a chaperone present. HENT: Head: Normocephalic. Eyes: Pupils: Pupils are equal, round, and reactive to light. Cardiovascular: Rate and Rhythm: Normal rate. Pulmonary: Effort: Pulmonary effort is normal. Breath sounds: No stridor. Chest: Breasts: Right: Swelling and mass present. Left: Normal.  Comments: Hematoma right breast Musculoskeletal: General: Normal range of motion. Cervical back: Normal range of motion. Lymphadenopathy: Upper Body: Right upper body: No supraclavicular or axillary adenopathy. Left upper body: No supraclavicular or axillary adenopathy. Skin: General: Skin is warm. Neurological: General: No focal deficit present. Mental Status: She is alert. Psychiatric: Mood and Affect: Mood normal.    Labs, Imaging and Diagnostic Testing:  Diagnosis Breast, right, needle core biopsy, upper outer - SCLEROSED PAPILLOMA WITH ASSOCIATED CALCIFICATIONS. Tilford Pillar DO Pathologist, Electronic Signature (Case  signed 11/29/2021)  CLINICAL DATA: 72 year old female presenting as a recall from screening for possible asymmetry with calcifications in the right breast. Family history of breast cancer in the patient's mother.  EXAM: DIGITAL DIAGNOSTIC UNILATERAL RIGHT MAMMOGRAM WITH TOMOSYNTHESIS; ULTRASOUND RIGHT BREAST LIMITED  TECHNIQUE: Right digital diagnostic mammography and breast tomosynthesis was performed.; Targeted ultrasound examination of the right breast was performed  COMPARISON: Previous exam(s).  ACR Breast Density Category a: The breast tissue is almost entirely fatty.  FINDINGS: Mammogram:  Right breast: Spot 2D magnification views and full field true lateral tomosynthesis views of the right breast were performed demonstrating persistence of a small elongated mass,  possibly a dilated duct with a few round calcifications spanning up to 1.3 cm. No additional new findings elsewhere in the right breast.  Ultrasound:  Targeted ultrasound is performed in the retroareolar right breast demonstrating a mildly dilated duct with internal debris at 11 o'clock 1 cm from the nipple. No solid intraductal mass identified. No definite calcifications identified within this duct.  IMPRESSION: Indeterminate small mass with calcifications in the retroareolar right breast spanning up to 1.3 cm. This may represent a small dilated duct with intraductal calcifications. Tissue sampling is recommended.  RECOMMENDATION: Stereotactic core needle biopsy x1 of the right breast.  I have discussed the findings and recommendations with the patient. If applicable, a reminder letter will be sent to the patient regarding the next appointment.  BI-RADS CATEGORY 4: Suspicious.   Electronically Signed By: Audie Pinto M.D. On: 11/13/2021 14:46  Assessment and Plan:  Diagnoses and all orders for this visit:  Intraductal papilloma of breast, right   Recommend genetic testing. By  definition she has roughly 20% lifetime risk of breast cancer giving 2 first-degree relatives. Both are deceased.  Given the sclerosed nature of the papilloma with size, recommend right breast seed lumpectomy. Risk and benefits surgery reviewed. Alternative therapies of surgery reviewed. Potential risk of upgrade being as high as 20% in the circumstance. Describes utilization as well as lumpectomy and postop recovery. Discussed complications of bleeding, infection cosmetic deformity, nipple numbness, nerve damage, organ injury, cardiovascular risk, and the need for the treatment center procedures.  No follow-ups on file.  Kennieth Francois, MD

## 2022-01-15 NOTE — Transfer of Care (Signed)
Immediate Anesthesia Transfer of Care Note  Patient: Erica Braun  Procedure(s) Performed: RIGHT BREAST LUMPECTOMY WITH RADIOACTIVE SEED LOCALIZATION (Right: Breast)  Patient Location: PACU  Anesthesia Type:General  Level of Consciousness: awake, alert , oriented, and responds to stimulation  Airway & Oxygen Therapy: Patient Spontanous Breathing and Patient connected to nasal cannula oxygen  Post-op Assessment: Report given to RN, Post -op Vital signs reviewed and stable, and Patient moving all extremities X 4  Post vital signs: Reviewed and stable  Last Vitals:  Vitals Value Taken Time  BP 112/64 01/15/22 1154  Temp    Pulse 79 01/15/22 1155  Resp 13 01/15/22 1155  SpO2 100 % 01/15/22 1155  Vitals shown include unvalidated device data.  Last Pain:  Vitals:   01/15/22 1003  PainSc: 0-No pain         Complications: No notable events documented.

## 2022-01-16 ENCOUNTER — Encounter: Payer: Self-pay | Admitting: *Deleted

## 2022-01-16 ENCOUNTER — Encounter (HOSPITAL_COMMUNITY): Payer: Self-pay | Admitting: Surgery

## 2022-01-20 LAB — SURGICAL PATHOLOGY

## 2022-01-21 ENCOUNTER — Encounter: Payer: Self-pay | Admitting: Surgery

## 2022-01-28 ENCOUNTER — Encounter: Payer: Self-pay | Admitting: Genetic Counselor

## 2022-01-28 NOTE — Progress Notes (Signed)
Patient called regarding copay with her new insurance for genetic appointment. Advised her to reach out to her insurance company and provided number to genetic billing at [phone]. She verbalized understanding.  She has my name and number for any additional financial questions or concerns.

## 2022-01-31 ENCOUNTER — Telehealth: Payer: Self-pay | Admitting: Cardiovascular Disease

## 2022-01-31 ENCOUNTER — Other Ambulatory Visit: Payer: Self-pay | Admitting: Family

## 2022-01-31 DIAGNOSIS — K2100 Gastro-esophageal reflux disease with esophagitis, without bleeding: Secondary | ICD-10-CM

## 2022-01-31 MED ORDER — NITROGLYCERIN 0.4 MG SL SUBL: 0.4000 mg | SUBLINGUAL_TABLET | SUBLINGUAL | 3 refills | Status: DC | PRN

## 2022-01-31 NOTE — Telephone Encounter (Signed)
*  STAT* If patient is at the pharmacy, call can be transferred to refill team.   1. Which medications need to be refilled? (please list name of each medication and dose if known)  nitroGLYCERIN (NITROSTAT) 0.4 MG SL tablet    2. Which pharmacy/location (including street and city if local pharmacy) is medication to be sent to?  [place]    3. Do they need a 30 day or 90 day supply? 90  Pt states her pills have expired

## 2022-01-31 NOTE — Telephone Encounter (Signed)
Refilled per request.

## 2022-02-03 ENCOUNTER — Other Ambulatory Visit: Payer: Self-pay

## 2022-02-03 ENCOUNTER — Inpatient Hospital Stay: Payer: PPO | Attending: Nurse Practitioner | Admitting: Genetic Counselor

## 2022-02-03 ENCOUNTER — Inpatient Hospital Stay: Payer: PPO

## 2022-02-03 ENCOUNTER — Other Ambulatory Visit: Payer: Self-pay | Admitting: Genetic Counselor

## 2022-02-03 DIAGNOSIS — Z8501 Personal history of malignant neoplasm of esophagus: Secondary | ICD-10-CM

## 2022-02-03 DIAGNOSIS — Z8719 Personal history of other diseases of the digestive system: Secondary | ICD-10-CM | POA: Diagnosis not present

## 2022-02-03 DIAGNOSIS — Z9189 Other specified personal risk factors, not elsewhere classified: Secondary | ICD-10-CM | POA: Diagnosis not present

## 2022-02-03 DIAGNOSIS — Z803 Family history of malignant neoplasm of breast: Secondary | ICD-10-CM | POA: Diagnosis not present

## 2022-02-03 NOTE — Progress Notes (Signed)
Error

## 2022-02-03 NOTE — Progress Notes (Signed)
REFERRING PROVIDER: Erroll Luna, MD [address] [place],  [place] [address]   PRIMARY PROVIDER:  Sharion Balloon, [name]  PRIMARY REASON FOR VISIT:  No diagnosis found.   HISTORY OF PRESENT ILLNESS:   Ms. Fruge, a 72 y.o. female, was seen for a [place] cancer genetics consultation at the request of Dr. Lenna Gilford due to a {Personal/family:20331} history of {cancer/polyps}.  Ms. Both presents to clinic today to discuss the possibility of a hereditary predisposition to cancer, to discuss genetic testing, and to further clarify her future cancer risks, as well as potential cancer risks for family members.   Ms. Lenhard is a 72 y.o. female with no personal history of cancer.    Esophageal--- dx around age 4 [place] on nose (2), BCC (labia)--1; dx after 49  Cervix cancel  CANCER HISTORY:  Oncology History Overview Note  Cancer Staging Squamous cell esophageal cancer ([place]) Staging form: Esophagus - Squamous Cell Carcinoma, AJCC 8th Edition - Clinical stage from 04/08/2018: Stage I (cT1b, cN0, cM0) - Signed by Truitt Merle, MD on 05/10/2018     Squamous cell esophageal cancer ([place]) (Resolved)  04/08/2018 Cancer Staging   Staging form: Esophagus - Squamous Cell Carcinoma, AJCC 8th Edition - Clinical stage from 04/08/2018: Stage I (cT1b, cN0, cM0) - Signed by Truitt Merle, MD on 05/10/2018   04/08/2018 Procedure   Upper Endoscopy Dr. Hilarie Fredrickson 04/08/18  IMPRESSION - Moderately severe erosive esophagitis. Biopsied. - Z-line regular, 35 cm from the incisors. - Normal stomach. - Normal examined duodenum.     04/08/2018 Initial Biopsy   Diagnosis 04/08/18 Surgical [P], mid esophagus - HIGH GRADE SQUAMOUS DYSPLASIA/SQUAMOUS CELL CARCINOMA. - SEE COMMENT.   04/23/2018 Imaging   CT CAP 04/23/18 IMPRESSION: 1. No evidence of metastatic disease within the chest, abdomen, or pelvis. 2. Colonic diverticulosis, without radiographic evidence of diverticulitis. 3. Tiny nonobstructing right  renal calculus.   05/03/2018 Procedure   EGD and EUS by Dr. Rush Landmark 05/03/18 IMPRESSION EGD Impression: - No gross lesions in esophagus. - Discolored, granular, nodular, texture changed, increased vascular pattern, ulcerated mucosa in the esophagus. Previously biopsied with HGD and most likely SCC. Did not rebiopsy as this could make ESD more difficult if it were to be considered. The lesion is partially circumferential but has at lest another 3 cm of lesion that can be visualized with white-light and NBI. Lugol's stain was not available for use today. - Z-line irregular, 39 cm from the incisors. - A small amount of food (residue) in the stomach. - Gastritis. Non-bleeding erosive gastropathy. Biopsied for HP. - No gross lesions in the duodenal bulb, in the first portion of the duodenum and in the second portion of the duodenum.  EUS Impression: - Wall thickening was seen in the thoracic esophagus from 25-30 cm was noted. The lesion however extends on EGD to at least 33 cm. The thickening appeared to primarily be within the luminal interface/superficial mucosa (Layer 1) and deep mucosa (Layer 2). However at approximately 27-28 cm, there was some loss of the submucosa. At no point during the EUS was visualization of the muscularis propria felt to be involved. This would be staged at least T1a but more likely T1b due to my inability to visualize one area of the submucosa in the middle of the circumferential lesion. From a nodal perspective, the nodes were small as noted below and also adjacent to the lesion. - There was no sign of significant pathology in the cervical esophagus and in the  gastroesophageal junction. - Two benign lymph nodes were visualized in the middle paraesophageal mediastinum (level 5M). Tissue has not been obtained. However, the endosonographic appearance is suggestive of benign nature. They were not sampled. - No malignant-appearing lymph nodes were visualized in  the paracardial region (level 16), left gastric region (level 17), gastrohepatic ligament (level 18) and celiac region (level 20).  Diagnosis Stomach, biopsy - GASTRIC ANTRAL MUCOSA WITH MILD REACTIVE GASTROPATHY. - GASTRIC OXYNTIC MUCOSA WITH MILD CHRONIC GASTRITIS. - NEGATIVE FOR HELICOBACTER PYLORI.    05/10/2018 Initial Diagnosis   Squamous cell esophageal cancer ([place])   05/17/2018 PET scan   PET 05/17/18 IMPRESSION: 1. Mid esophageal hypermetabolism, presumably representing the primary lesion. 2. No findings of hypermetabolic metastatic disease. 3. Aortic atherosclerosis (ICD10-I70.0), coronary artery atherosclerosis and emphysema (ICD10-J43.9). 4. Right nephrolithiasis    07/08/2018 Procedure   EGD at [facility] 07/08/18  IMPRESSION -known squamous cell carcioma was ofund in the middle of third of the esophogus. Complete removal was accomplished with ESD. Single clip palced.  -Nomral stomach  -Nomral examined duoenum -Endocopic submucosal dissection was performed, Resectino and retreval were complete    07/08/2018 Pathology Results   ENDOSCOPIC SUBMUCOSAL DISSECTION OF ESOPHAGUS Pathology 07/08/18 A.  Esophagus, endoscopic submucosal dissection:  Multiple microscopic foci of moderately differentiated squamous cell carcinoma invading the muscularis mucosae in a background of extensive squamous cell carcinoma in situ.   Comment:  The largest focus of invasion measures approximately 3 mm in greatest dimension, and very closely approaches the deep margin at a distance of 0.074 mm.  The invasive tumor ends approximately at the junction between muscularis mucosae and submucosa; given that the margin is so close here, it is difficult to exclude if un-sampled submucosal invasion is present.      RISK FACTORS:  Mammogram within the last year: *** Number of breast biopsies: {Numbers 1-12 multi-select:20307}. Colonoscopy: yes;  most recent in 2017 . Hysterectomy: yes ~ 1996  Ovaries intact:  no.  Up to date with pelvic exams: {Yes/No-Ex:120004}. Menarche was at age 2.  First live birth at age 27.   HRT use: 0 years. Dermatology screening: most recent in screening approx 2 years ago.   Past Medical History:  Diagnosis Date   Complication of anesthesia    COPD (chronic obstructive pulmonary disease) ([place])    Coronary artery disease    Diabetes mellitus without complication ([place])    Diverticulosis    Early cataracts, bilateral 11/21/2016   Emphysema of lung ([place])    Esophageal cancer (HCC)    GERD (gastroesophageal reflux disease)    Glaucoma    patient denies   Hyperlipidemia    Hypertension    Internal hemorrhoids    Osteoporosis 12/16/2021   Pneumonia    PONV (postoperative nausea and vomiting)    Sleep apnea    uses Cpap   Spasm of the cricopharyngeus muscle    Squamous cell esophageal cancer ([place]) 05/10/2018    Past Surgical History:  Procedure Laterality Date   ABDOMINAL HYSTERECTOMY     cervical dysplasia   BIOPSY  05/03/2018   Procedure: BIOPSY;  Surgeon: Irving Copas., MD;  Location: [facility];  Service: Gastroenterology;;   BREAST BIOPSY Left    BREAST BIOPSY Right 11/27/2021   MM RT BREAST BX W LOC DEV 1ST LESION IMAGE BX SPEC STEREO GUIDE 11/27/2021 [facility]   BREAST BIOPSY  01/14/2022   MM RT RADIOACTIVE SEED LOC MAMMO GUIDE 01/14/2022 [facility]   BREAST LUMPECTOMY Left  BREAST LUMPECTOMY WITH RADIOACTIVE SEED LOCALIZATION Right 01/15/2022   Procedure: RIGHT BREAST LUMPECTOMY WITH RADIOACTIVE SEED LOCALIZATION;  Surgeon: Erroll Luna, MD;  Location: [facility];  Service: General;  Laterality: Right;   ESOPHAGOGASTRODUODENOSCOPY (EGD) WITH PROPOFOL N/A 05/03/2018   Procedure: ESOPHAGOGASTRODUODENOSCOPY (EGD) WITH PROPOFOL;  Surgeon: Irving Copas., MD;  Location: [facility];  Service: Gastroenterology;  Laterality: N/A;   ESOPHAGOGASTRODUODENOSCOPY (EGD) WITH PROPOFOL N/A 11/18/2018   Procedure:  ESOPHAGOGASTRODUODENOSCOPY (EGD) WITH PROPOFOL;  Surgeon: Irene Shipper, MD;  Location[facility] ENDOSCOPY;  Service: Endoscopy;  Laterality: N/A;   EUS  05/03/2018   Procedure: UPPER ENDOSCOPIC ULTRASOUND (EUS) RADIAL;  Surgeon: Irving Copas., MD;  Location: [facility] ENDOSCOPY;  Service: Gastroenterology;;   FOREIGN BODY REMOVAL  11/18/2018   Procedure: FOREIGN BODY REMOVAL;  Surgeon: Irene Shipper, MD;  Location: [facility] ENDOSCOPY;  Service: Endoscopy;;   LEFT HEART CATH AND CORONARY ANGIOGRAPHY N/A 06/11/2017   Procedure: LEFT HEART CATH AND CORONARY ANGIOGRAPHY;  Surgeon: Belva Crome, MD;  Location: [facility] CV LAB;  Service: Cardiovascular;  Laterality: N/A;    Social History   Socioeconomic History   Marital status: Married    Spouse name: [name]   Number of children: 3   Years of education: 14   Highest education level: Some college, no degree  Occupational History   Occupation: retired    Comment: Advertising account planner  Tobacco Use   Smoking status: Former    Packs/day: 1.50    Years: 41.00    Total pack years: 61.50    Types: Cigarettes    Start date: 1969    Quit date: 2010    Years since quitting: 14.0   Smokeless tobacco: Never  Vaping Use   Vaping Use: Never used  Substance and Sexual Activity   Alcohol use: Yes    Comment: wine occasionaly   Drug use: No   Sexual activity: Yes    Birth control/protection: Post-menopausal  Other Topics Concern   Not on file  Social History Narrative   Recent move from [place] to walk   Married to [employer] dog at home   Social Determinants of [employer]: Low Risk  (12/09/2021)   Overall Financial Resource Strain (CARDIA)    Difficulty of Paying Living Expenses: Not hard at all  Food Insecurity: No Food Insecurity (12/09/2021)   Hunger Vital Sign    Worried About Running Out of Food in the Last Year: Never true    [place] in the Last Year: Never true  Transportation Needs: No Transportation  Needs (12/09/2021)   PRAPARE - Hydrologist (Medical): No    Lack of Transportation (Non-Medical): No  Physical Activity: Insufficiently Active (12/09/2021)   Exercise Vital Sign    Days of Exercise per Week: 3 days    Minutes of Exercise per Session: 30 min  Stress: No Stress Concern Present (12/09/2021)   [place]    Feeling of Stress : Only a little  Social Connections: Socially Integrated (12/09/2021)   Social Connection and Isolation Panel [NHANES]    Frequency of Communication with Friends and Family: More than three times a week    Frequency of Social Gatherings with Friends and Family: More than three times a week    Attends Religious Services: More than 4 times per year    Active Member of [employer] or Organizations:  Yes    Attends Archivist Meetings: More than 4 times per year    Marital Status: Married     FAMILY HISTORY:  We obtained a detailed, 4-generation family history.  Significant diagnoses are listed below: Family History  Problem Relation Age of Onset   Cancer Mother        breast   Arthritis Mother    COPD Father 18       emphysema   Alcohol abuse Father    Diabetes Father    Cancer Sister        breast   Early death Brother        overdose   Drug abuse Brother    Alcohol abuse Paternal Aunt    COPD Paternal Aunt    Cancer Maternal Grandmother        liver cancer   Diabetes Maternal Grandmother    Alcohol abuse Maternal Grandfather    Early death Maternal Grandfather    Stroke Paternal Grandmother    Heart disease Paternal Grandfather    Esophageal cancer Neg Hx    Stomach cancer Neg Hx    Colon cancer Neg Hx     Ms. Chumley is {aware/unaware} of previous family history of genetic testing for hereditary cancer risks. Patient's maternal ancestors are of *** descent, and paternal ancestors are of *** descent. There {IS NO:12509} reported Ashkenazi  Jewish ancestry. There {IS NO:12509} known consanguinity.  GENETIC COUNSELING ASSESSMENT: Ms. Grosso is a 72 y.o. female with a {Personal/family:20331} history of {cancer/polyps} which is somewhat suggestive of a {DISEASE} and predisposition to cancer given ***. We, therefore, discussed and recommended the following at today's visit.   DISCUSSION: We discussed that *** - ***% of *** is hereditary, with most cases of hereditary *** cancer associated with ***.  There are other genes that can be associated with hereditary *** cancer syndromes.  These include ***.  We discussed that testing is beneficial for several reasons, including knowing about other cancer risks, identifying potential screening and risk-reduction options that may be appropriate, and to understanding if other family members could be at risk for cancer and allowing them to undergo genetic testing.  We reviewed the characteristics, features and inheritance patterns of hereditary cancer syndromes. We also discussed genetic testing, including the appropriate family members to test, the process of testing, insurance coverage and turn-around-time for results. We discussed the implications of a negative, positive, and variant of uncertain significant result. ***We discussed that negative results would be uninformative given that Ms. Ortego does not have a personal history of cancer. We recommended Ms. Villwock pursue genetic testing for a panel that contains genes associated with ***.  Ms. Barth was offered a common hereditary cancer panel (48 genes) and an expanded pan-cancer panel (85 genes). Ms. Stonesifer was informed of the benefits and limitations of each panel, including that expanded pan-cancer panels contain several genes that do not have clear management guidelines at this point in time.  We also discussed that as the number of genes included on a panel increases, the chances of variants of uncertain significance increases.   After considering the benefits and limitations of each gene panel, Ms. Vanmeter elected to have an *** through ***.   Based on Ms. Dubreuil's {Personal/family:20331} history of cancer, she meets medical criteria for genetic testing. Despite that she meets criteria, she may still have an out of pocket cost. We discussed that if her out of pocket cost for testing is over $100, the laboratory  should contact them to discuss self-pay options and/or patient pay assistance programs.   ***We reviewed the characteristics, features and inheritance patterns of hereditary cancer syndromes. We also discussed genetic testing, including the appropriate family members to test, the process of testing, insurance coverage and turn-around-time for results. We discussed the implications of a negative, positive and/or variant of uncertain significant result. In order to get genetic test results in a timely manner so that Ms. Arguijo can use these genetic test results for surgical decisions, we recommended Ms. Finkle pursue genetic testing for the ***. Once complete, we recommend Ms. Arntz pursue reflex genetic testing to the *** gene panel.   Based on Ms. Wacha's {Personal/family:20331} history of cancer, she meets medical criteria for genetic testing. Despite that she meets criteria, she may still have an out of pocket cost.   ***We discussed with Ms. Batte that the {Personal/family:20331} history does not meet insurance or NCCN criteria for genetic testing and, therefore, is not highly consistent with a familial hereditary cancer syndrome.  We feel she is at low risk to harbor a gene mutation associated with such a condition. Thus, we did not recommend any genetic testing, at this time, and recommended Ms. Toren continue to follow the cancer screening guidelines given by her primary healthcare provider.  ***In order to estimate her chance of having a {CA GENE:62345} mutation, we used statistical models  ({GENMODELS:62370}) that consider her personal medical history, family history and ancestry.  Because each model is different, there can be a lot of variability in the risks they give.  Therefore, these numbers must be considered a rough range and not a precise risk of having a {CA GENE:62345} mutation.  These models estimate that she has approximately a ***-***% chance of having a mutation. Based on this assessment of her family and personal history, genetic testing {IS/ISNOT:34056} recommended.  ***Based on the patient's {Personal/family:20331} history, a statistical model ({GENMODELS:62370}) was used to estimate her risk of developing {CA HX:54794}. This estimates her lifetime risk of developing {CA HX:54794} to be approximately ***%. This estimation does not consider any genetic testing results.  The patient's lifetime breast cancer risk is a preliminary estimate based on available information using one of several models endorsed by the [place] (ACS). The ACS recommends consideration of breast MRI screening as an adjunct to mammography for patients at high risk (defined as 20% or greater lifetime risk).   ***Ms. Cundy has been determined to be at high risk for breast cancer.  Therefore, we recommend that annual screening with mammography and breast MRI be performed.  ***begin at age 3, or 10 years prior to the age of breast cancer diagnosis in a relative (whichever is earlier).  We discussed that Ms. Karlen should discuss her individual situation with her referring physician and determine a breast cancer screening plan with which they are both comfortable.    We discussed the Genetic Information Non-Discrimination Act (GINA) of 2008, which helps protect individuals against genetic discrimination based on their genetic test results.  It impacts both health insurance and employment.  With health insurance, it protects against genetic test results being used for increased premiums or  policy termination. For employment, it protects against hiring, firing and promoting decisions based on genetic test results.  GINA does not apply to those in the [employer], those who work for companies with less than 15 employees, and new life insurance or long-term disability insurance policies.  Health status due to a cancer diagnosis is not protected  under GINA.  PLAN: After considering the risks, benefits, and limitations, Ms. Ruddell provided informed consent to pursue genetic testing and the blood sample was sent to {Lab} Laboratories for analysis of the {test}. Results should be available within approximately {TAT TIME} weeks' time, at which point they will be disclosed by telephone to Ms. Madril, as will any additional recommendations warranted by these results. Ms. Flicker will receive a summary of her genetic counseling visit and a copy of her results once available. This information will also be available in Epic.   *** Despite our recommendation, Ms. Garramone did not wish to pursue genetic testing at today's visit. We understand this decision and remain available to coordinate genetic testing at any time in the future. We, therefore, recommend Ms. Santini continue to follow the cancer screening guidelines given by her primary healthcare provider.  ***Based on Ms. Mccreadie's family history, we recommended her ***, who was diagnosed with *** at age ***, have genetic counseling and testing. Ms. Boutin will let us know if we can be of any assistance in coordinating genetic counseling and/or testing for this family member.   Lastly, we encouraged Ms. Novosel to remain in contact with cancer genetics annually so that we can continuously update the family history and inform her of any changes in cancer genetics and testing that may be of benefit for this family.   Ms. Malbrough questions were answered to her satisfaction today. Our contact information was provided should additional  questions or concerns arise. ***Thank you for the referral and allowing [place] to share in the care of your patient.   Persephone Schriever M. Joette Catching, [place], [facility] Genetic Counselor [email or web address] (P) [phone]   The patient was seen for a total of *** minutes in face-to-face genetic counseling.  ***The was patient was accompanied by ***.  ***The patient was seen alone.  Drs. Lindi Adie and/or Burr Medico were available to discuss this case as needed.  _______________________________________________________________________ For Office Staff:  Number of people involved in session: *** Was an Intern/ student involved with case: {YES/NO:63}

## 2022-02-04 ENCOUNTER — Encounter: Payer: Self-pay | Admitting: Genetic Counselor

## 2022-02-17 ENCOUNTER — Telehealth: Payer: Self-pay | Admitting: Pulmonary Disease

## 2022-02-17 NOTE — Telephone Encounter (Signed)
Spoke to [place] from [employer] [phone] and she states she needs formulary statement saying why pt needs brand name Advair and if the pt has tried any other medication besides Advair in a trial and error. I informed Laretta Bolster I will send the dr a message. She verbalized understanding.

## 2022-02-17 NOTE — Telephone Encounter (Signed)
Called and spoke with SaraBeth letting her know the info per Dr. Halford Chessman and she verbalized understanding. Nothing further needed.

## 2022-02-17 NOTE — Telephone Encounter (Signed)
Can she get generic advair - fluticasone/salmeterol?  If she can, then I am okay with changing to this in place of brand name advair.

## 2022-02-19 DIAGNOSIS — G4733 Obstructive sleep apnea (adult) (pediatric): Secondary | ICD-10-CM | POA: Diagnosis not present

## 2022-02-25 ENCOUNTER — Encounter (HOSPITAL_COMMUNITY): Payer: Self-pay

## 2022-03-07 ENCOUNTER — Telehealth (INDEPENDENT_AMBULATORY_CARE_PROVIDER_SITE_OTHER): Payer: PPO | Admitting: Family Medicine

## 2022-03-07 ENCOUNTER — Encounter: Payer: Self-pay | Admitting: Family Medicine

## 2022-03-07 DIAGNOSIS — J441 Chronic obstructive pulmonary disease with (acute) exacerbation: Secondary | ICD-10-CM

## 2022-03-07 MED ORDER — PREDNISONE 10 MG (21) PO TBPK: ORAL_TABLET | ORAL | 0 refills | Status: DC

## 2022-03-07 MED ORDER — DOXYCYCLINE HYCLATE 100 MG PO TABS: 100.0000 mg | ORAL_TABLET | Freq: Two times a day (BID) | ORAL | 0 refills | Status: AC

## 2022-03-07 NOTE — Progress Notes (Signed)
   Virtual Visit via video Note   Due to COVID-19 pandemic this visit was conducted virtually. This visit type was conducted due to national recommendations for restrictions regarding the COVID-19 Pandemic (e.g. social distancing, sheltering in place) in an effort to limit this patient's exposure and mitigate transmission in our community. All issues noted in this document were discussed and addressed.  A physical exam was not performed with this format.  I connected with  Erica Braun  on 03/07/22 at 1330 by video and verified that I am speaking with the correct person using two identifiers. Erica Braun is currently located at home and no one is currently with her during the visit. The provider, Gwenlyn Perking, [name] is located in their office at time of visit.  I discussed the limitations, risks, security and privacy concerns of performing an evaluation and management service by video  and the availability of in person appointments. I also discussed with the patient that there may be a patient responsible charge related to this service. The patient expressed understanding and agreed to proceed.  CC: COPD  History and Present Illness:  [name] reports increased cough, wheezing, and shortness of breath since having Covid. Her symptoms started 13 days ago. She reports that sore throat, fever, chills, and body aches resolved after a few days, but her chest congestion and cough has been worsening. She reports shortness of breath and wheezing with coughing fits or activity. She had had to use her albuterol nebulizer TID for the the few days. Her cough is productive. She reports that her oxygen saturation has been above 90%. Denies chest pain. She does feel a little better today compared to yesterday. She has been taking mucinex without improvement.    ROS As per HPI.     Observations/Objective: Alert and oriented x 3. Non toxic appearing. Respirations unlabored. Able to speak  in full sentences without difficulty. Normal mood and behavior.    Assessment and Plan: Anner was seen today for copd.  Diagnoses and all orders for this visit:  COPD exacerbation ([place]) Doxycyline and prednisone as below. Continue advair and albuterol. Continue mucinex.  -     doxycycline (VIBRA-TABS) 100 MG tablet; Take 1 tablet (100 mg total) by mouth 2 (two) times daily for 7 days. 1 po bid -     predniSONE (STERAPRED UNI-PAK 21 TAB) 10 MG (21) TBPK tablet; Use as directed on back of pill pack     Follow Up Instructions: Return to office for new or worsening symptoms, or if symptoms persist. Discussed when to seek emergency care.     I discussed the assessment and treatment plan with the patient. The patient was provided an opportunity to ask questions and all were answered. The patient agreed with the plan and demonstrated an understanding of the instructions.   The patient was advised to call back or seek an in-person evaluation if the symptoms worsen or if the condition fails to improve as anticipated.  The above assessment and management plan was discussed with the patient. The patient verbalized understanding of and has agreed to the management plan. Patient is aware to call the clinic if symptoms persist or worsen. Patient is aware when to return to the clinic for a follow-up visit. Patient educated on when it is appropriate to go to the emergency department.   Time call ended: 1306  I provided 6 minutes of face-to-face time during this encounter.    Gwenlyn Perking, [name]

## 2022-03-10 ENCOUNTER — Telehealth: Payer: Self-pay | Admitting: *Deleted

## 2022-03-10 MED ORDER — FLUTICASONE-SALMETEROL 250-50 MCG/ACT IN AEPB: 1.0000 | INHALATION_SPRAY | Freq: Two times a day (BID) | RESPIRATORY_TRACT | 11 refills | Status: DC

## 2022-03-10 NOTE — Telephone Encounter (Signed)
patient would like a generic prescription of Advair (insurance will cover generic but not name brand) sent over to [facility]. please call when script is in

## 2022-03-10 NOTE — Telephone Encounter (Signed)
Generic Rx for advair sent to [facility].

## 2022-03-28 ENCOUNTER — Ambulatory Visit (INDEPENDENT_AMBULATORY_CARE_PROVIDER_SITE_OTHER): Payer: PPO | Admitting: Family

## 2022-03-28 ENCOUNTER — Encounter: Payer: Self-pay | Admitting: Family

## 2022-03-28 VITALS — BP 114/86 | HR 98 | Temp 96.0°F | Ht 62.0 in | Wt 132.4 lb

## 2022-03-28 DIAGNOSIS — B379 Candidiasis, unspecified: Secondary | ICD-10-CM | POA: Diagnosis not present

## 2022-03-28 DIAGNOSIS — N390 Urinary tract infection, site not specified: Secondary | ICD-10-CM

## 2022-03-28 DIAGNOSIS — T3695XA Adverse effect of unspecified systemic antibiotic, initial encounter: Secondary | ICD-10-CM

## 2022-03-28 DIAGNOSIS — R399 Unspecified symptoms and signs involving the genitourinary system: Secondary | ICD-10-CM

## 2022-03-28 LAB — URINALYSIS, COMPLETE
Bilirubin, UA: NEGATIVE
Ketones, UA: NEGATIVE
Nitrite, UA: NEGATIVE
Protein,UA: NEGATIVE
RBC, UA: NEGATIVE
Specific Gravity, UA: 1.015 (ref 1.005–1.030)
Urobilinogen, Ur: 0.2 mg/dL (ref 0.2–1.0)
pH, UA: 5 (ref 5.0–7.5)

## 2022-03-28 LAB — MICROSCOPIC EXAMINATION
RBC, Urine: NONE SEEN /hpf (ref 0–2)
Renal Epithel, UA: NONE SEEN /hpf

## 2022-03-28 MED ORDER — CEPHALEXIN 500 MG PO CAPS: 500.0000 mg | ORAL_CAPSULE | Freq: Two times a day (BID) | ORAL | 0 refills | Status: DC

## 2022-03-28 MED ORDER — FLUCONAZOLE 150 MG PO TABS: 150.0000 mg | ORAL_TABLET | ORAL | 0 refills | Status: DC | PRN

## 2022-03-28 NOTE — Progress Notes (Signed)
Subjective:    Patient ID: [name], female    DOB: [birth year], 72 y.o.   MRN: [id number]  Chief Complaint  Patient presents with   Urinary Tract Infection   Pt presents to the office today with dysuria that started three days with urgency and frequency that has worsen.  Dysuria  This is a new problem. The current episode started in the past 7 days. The problem occurs intermittently. The problem has been waxing and waning. The quality of the pain is described as burning. The pain is at a severity of 4/10. The pain is mild. There has been no fever. Associated symptoms include frequency, hesitancy and urgency. Pertinent negatives include no hematuria or nausea. She has tried increased fluids for the symptoms. The treatment provided mild relief.      Review of Systems  Gastrointestinal:  Negative for nausea.  Genitourinary:  Positive for dysuria, frequency, hesitancy and urgency. Negative for hematuria.  All other systems reviewed and are negative.      Objective:   Physical Exam Vitals reviewed.  Constitutional:      General: She is not in acute distress.    Appearance: She is well-developed.  HENT:     Head: Normocephalic and atraumatic.  Eyes:     Pupils: Pupils are equal, round, and reactive to light.  Neck:     Thyroid: No thyromegaly.  Cardiovascular:     Rate and Rhythm: Normal rate and regular rhythm.     Heart sounds: Normal heart sounds. No murmur heard. Pulmonary:     Effort: Pulmonary effort is normal. No respiratory distress.     Breath sounds: Normal breath sounds. No wheezing.  Abdominal:     General: Bowel sounds are normal. There is no distension.     Palpations: Abdomen is soft.     Tenderness: There is no abdominal tenderness.  Musculoskeletal:        General: No tenderness. Normal range of motion.     Cervical back: Normal range of motion and neck supple.  Skin:    General: Skin is warm and dry.  Neurological:     Mental Status: She  is alert and oriented to person, place, and time.     Cranial Nerves: No cranial nerve deficit.     Deep Tendon Reflexes: Reflexes are normal and symmetric.  Psychiatric:        Behavior: Behavior normal.        Thought Content: Thought content normal.        Judgment: Judgment normal.      BP 114/86   Pulse 98   Temp (!) 96 F (35.6 C) (Temporal)   Ht 5\' 2"  (1.575 m)   Wt 132 lb 6.4 oz (60.1 kg)   SpO2 98%   BMI 24.22 kg/m       Assessment & Plan:  Kenn File Berberian comes in today with chief complaint of Urinary Tract Infection   Diagnosis and orders addressed:  1. Urinary tract infection without hematuria, site unspecified Force fluids AZO over the counter X2 days RTO prn Culture pending - Urinalysis, Complete - Urine Culture - cephALEXin (KEFLEX) 500 MG capsule; Take 1 capsule (500 mg total) by mouth 2 (two) times daily.  Dispense: 14 capsule; Refill: 0  2. UTI symptoms  3. Antibiotic-induced yeast infection - fluconazole (DIFLUCAN) 150 MG tablet; Take 1 tablet (150 mg total) by mouth every three (3) days as needed.  Dispense: 3 tablet; Refill: 0   Evelina Dun,  [name]    

## 2022-03-28 NOTE — Patient Instructions (Signed)
Urinary Tract Infection, Adult  A urinary tract infection (UTI) is an infection of any part of the urinary tract. The urinary tract includes the kidneys, ureters, bladder, and urethra. These organs make, store, and get rid of urine in the body. An upper UTI affects the ureters and kidneys. A lower UTI affects the bladder and urethra. What are the causes? Most urinary tract infections are caused by bacteria in your genital area around your urethra, where urine leaves your body. These bacteria grow and cause inflammation of your urinary tract. What increases the risk? You are more likely to develop this condition if: You have a urinary catheter that stays in place. You are not able to control when you urinate or have a bowel movement (incontinence). You are female and you: Use a spermicide or diaphragm for birth control. Have low estrogen levels. Are pregnant. You have certain genes that increase your risk. You are sexually active. You take antibiotic medicines. You have a condition that causes your flow of urine to slow down, such as: An enlarged prostate, if you are female. Blockage in your urethra. A kidney stone. A nerve condition that affects your bladder control (neurogenic bladder). Not getting enough to drink, or not urinating often. You have certain medical conditions, such as: Diabetes. A weak disease-fighting system (immunesystem). Sickle cell disease. Gout. Spinal cord injury. What are the signs or symptoms? Symptoms of this condition include: Needing to urinate right away (urgency). Frequent urination. This may include small amounts of urine each time you urinate. Pain or burning with urination. Blood in the urine. Urine that smells bad or unusual. Trouble urinating. Cloudy urine. Vaginal discharge, if you are female. Pain in the abdomen or the lower back. You may also have: Vomiting or a decreased appetite. Confusion. Irritability or tiredness. A fever or  chills. Diarrhea. The first symptom in older adults may be confusion. In some cases, they may not have any symptoms until the infection has worsened. How is this diagnosed? This condition is diagnosed based on your medical history and a physical exam. You may also have other tests, including: Urine tests. Blood tests. Tests for STIs (sexually transmitted infections). If you have had more than one UTI, a cystoscopy or imaging studies may be done to determine the cause of the infections. How is this treated? Treatment for this condition includes: Antibiotic medicine. Over-the-counter medicines to treat discomfort. Drinking enough water to stay hydrated. If you have frequent infections or have other conditions such as a kidney stone, you may need to see a health care provider who specializes in the urinary tract (urologist). In rare cases, urinary tract infections can cause sepsis. Sepsis is a life-threatening condition that occurs when the body responds to an infection. Sepsis is treated in the hospital with IV antibiotics, fluids, and other medicines. Follow these instructions at home:  Medicines Take over-the-counter and prescription medicines only as told by your health care provider. If you were prescribed an antibiotic medicine, take it as told by your health care provider. Do not stop using the antibiotic even if you start to feel better. General instructions Make sure you: Empty your bladder often and completely. Do not hold urine for long periods of time. Empty your bladder after sex. Wipe from front to back after urinating or having a bowel movement if you are female. Use each tissue only one time when you wipe. Drink enough fluid to keep your urine pale yellow. Keep all follow-up visits. This is important. Contact a health   care provider if: Your symptoms do not get better after 1-2 days. Your symptoms go away and then return. Get help right away if: You have severe pain in  your back or your lower abdomen. You have a fever or chills. You have nausea or vomiting. Summary A urinary tract infection (UTI) is an infection of any part of the urinary tract, which includes the kidneys, ureters, bladder, and urethra. Most urinary tract infections are caused by bacteria in your genital area. Treatment for this condition often includes antibiotic medicines. If you were prescribed an antibiotic medicine, take it as told by your health care provider. Do not stop using the antibiotic even if you start to feel better. Keep all follow-up visits. This is important. This information is not intended to replace advice given to you by your health care provider. Make sure you discuss any questions you have with your health care provider. Document Revised: 08/05/2019 Document Reviewed: 08/05/2019 Elsevier Patient Education  2023 Elsevier Inc.  

## 2022-04-01 LAB — URINE CULTURE

## 2022-04-03 ENCOUNTER — Other Ambulatory Visit: Payer: Self-pay | Admitting: Family

## 2022-04-03 MED ORDER — FLUCONAZOLE 150 MG PO TABS: 150.0000 mg | ORAL_TABLET | ORAL | 0 refills | Status: DC | PRN

## 2022-04-11 ENCOUNTER — Ambulatory Visit: Payer: Medicare HMO | Admitting: Family

## 2022-04-14 ENCOUNTER — Ambulatory Visit (INDEPENDENT_AMBULATORY_CARE_PROVIDER_SITE_OTHER): Payer: PPO | Admitting: Family

## 2022-04-14 ENCOUNTER — Encounter: Payer: Self-pay | Admitting: Family

## 2022-04-14 VITALS — BP 124/60 | HR 92 | Temp 97.2°F | Ht 62.0 in | Wt 130.6 lb

## 2022-04-14 DIAGNOSIS — E785 Hyperlipidemia, unspecified: Secondary | ICD-10-CM | POA: Diagnosis not present

## 2022-04-14 DIAGNOSIS — I2510 Atherosclerotic heart disease of native coronary artery without angina pectoris: Secondary | ICD-10-CM | POA: Diagnosis not present

## 2022-04-14 DIAGNOSIS — J431 Panlobular emphysema: Secondary | ICD-10-CM | POA: Diagnosis not present

## 2022-04-14 DIAGNOSIS — Z8501 Personal history of malignant neoplasm of esophagus: Secondary | ICD-10-CM | POA: Diagnosis not present

## 2022-04-14 DIAGNOSIS — E1169 Type 2 diabetes mellitus with other specified complication: Secondary | ICD-10-CM

## 2022-04-14 DIAGNOSIS — K2100 Gastro-esophageal reflux disease with esophagitis, without bleeding: Secondary | ICD-10-CM

## 2022-04-14 DIAGNOSIS — I152 Hypertension secondary to endocrine disorders: Secondary | ICD-10-CM | POA: Diagnosis not present

## 2022-04-14 DIAGNOSIS — I25118 Atherosclerotic heart disease of native coronary artery with other forms of angina pectoris: Secondary | ICD-10-CM

## 2022-04-14 DIAGNOSIS — G4733 Obstructive sleep apnea (adult) (pediatric): Secondary | ICD-10-CM | POA: Diagnosis not present

## 2022-04-14 DIAGNOSIS — E1159 Type 2 diabetes mellitus with other circulatory complications: Secondary | ICD-10-CM | POA: Diagnosis not present

## 2022-04-14 DIAGNOSIS — Z87891 Personal history of nicotine dependence: Secondary | ICD-10-CM | POA: Diagnosis not present

## 2022-04-14 LAB — BAYER DCA HB A1C WAIVED: HB A1C (BAYER DCA - WAIVED): 7.2 % — ABNORMAL HIGH (ref 4.8–5.6)

## 2022-04-14 NOTE — Patient Instructions (Signed)
Health Maintenance After Age 73 After age 73, you are at a higher risk for certain long-term diseases and infections as well as injuries from falls. Falls are a major cause of broken bones and head injuries in people who are older than age 73. Getting regular preventive care can help to keep you healthy and well. Preventive care includes getting regular testing and making lifestyle changes as recommended by your health care provider. Talk with your health care provider about: Which screenings and tests you should have. A screening is a test that checks for a disease when you have no symptoms. A diet and exercise plan that is right for you. What should I know about screenings and tests to prevent falls? Screening and testing are the best ways to find a health problem early. Early diagnosis and treatment give you the best chance of managing medical conditions that are common after age 73. Certain conditions and lifestyle choices may make you more likely to have a fall. Your health care provider may recommend: Regular vision checks. Poor vision and conditions such as cataracts can make you more likely to have a fall. If you wear glasses, make sure to get your prescription updated if your vision changes. Medicine review. Work with your health care provider to regularly review all of the medicines you are taking, including over-the-counter medicines. Ask your health care provider about any side effects that may make you more likely to have a fall. Tell your health care provider if any medicines that you take make you feel dizzy or sleepy. Strength and balance checks. Your health care provider may recommend certain tests to check your strength and balance while standing, walking, or changing positions. Foot health exam. Foot pain and numbness, as well as not wearing proper footwear, can make you more likely to have a fall. Screenings, including: Osteoporosis screening. Osteoporosis is a condition that causes  the bones to get weaker and break more easily. Blood pressure screening. Blood pressure changes and medicines to control blood pressure can make you feel dizzy. Depression screening. You may be more likely to have a fall if you have a fear of falling, feel depressed, or feel unable to do activities that you used to do. Alcohol use screening. Using too much alcohol can affect your balance and may make you more likely to have a fall. Follow these instructions at home: Lifestyle Do not drink alcohol if: Your health care provider tells you not to drink. If you drink alcohol: Limit how much you have to: 0-1 drink a day for women. 0-2 drinks a day for men. Know how much alcohol is in your drink. In the [place] one drink equals one 12 oz bottle of beer (355 mL), one 5 oz glass of wine (148 mL), or one 1 oz glass of hard liquor (44 mL). Do not use any products that contain nicotine or tobacco. These products include cigarettes, chewing tobacco, and vaping devices, such as e-cigarettes. If you need help quitting, ask your health care provider. Activity  Follow a regular exercise program to stay fit. This will help you maintain your balance. Ask your health care provider what types of exercise are appropriate for you. If you need a cane or walker, use it as recommended by your health care provider. Wear supportive shoes that have nonskid soles. Safety  Remove any tripping hazards, such as rugs, cords, and clutter. Install safety equipment such as grab bars in bathrooms and safety rails on stairs. Keep rooms and walkways   well-lit. General instructions Talk with your health care provider about your risks for falling. Tell your health care provider if: You fall. Be sure to tell your health care provider about all falls, even ones that seem minor. You feel dizzy, tiredness (fatigue), or off-balance. Take over-the-counter and prescription medicines only as told by your health care provider. These include  supplements. Eat a healthy diet and maintain a healthy weight. A healthy diet includes low-fat dairy products, low-fat (lean) meats, and fiber from whole grains, beans, and lots of fruits and vegetables. Stay current with your vaccines. Schedule regular health, dental, and eye exams. Summary Having a healthy lifestyle and getting preventive care can help to protect your health and wellness after age 73. Screening and testing are the best way to find a health problem early and help you avoid having a fall. Early diagnosis and treatment give you the best chance for managing medical conditions that are more common for people who are older than age 73. Falls are a major cause of broken bones and head injuries in people who are older than age 73. Take precautions to prevent a fall at home. Work with your health care provider to learn what changes you can make to improve your health and wellness and to prevent falls. This information is not intended to replace advice given to you by your health care provider. Make sure you discuss any questions you have with your health care provider. Document Revised: 05/14/2020 Document Reviewed: 05/14/2020 Elsevier Patient Education  2023 Elsevier Inc.  

## 2022-04-14 NOTE — Progress Notes (Signed)
Subjective:    Patient ID: Erica Braun, female    DOB: 10/28/1950, 72 y.o.   MRN: [id number]  Chief Complaint  Patient presents with   Medical Management of Chronic Issues   Pt presents to the office today for chronic follow up. She is followed by Cardiologists for CAD, pulmonologist's for COPD, and GI for esophageal dysphagia and hx of squamous cell esophageal cancer. These are stable.    CAD and takes Crestor daily.     Has COPD and quit smoking approx 2011. Has intermittent SOB that is worse in the heat.    Has OSA and uses CPAP nightly.  Hypertension This is a chronic problem. The current episode started more than 1 year ago. The problem has been resolved since onset. The problem is controlled. Pertinent negatives include no blurred vision, malaise/fatigue, peripheral edema or shortness of breath. Risk factors for coronary artery disease include dyslipidemia. The current treatment provides moderate improvement. There is no history of heart failure.  Gastroesophageal Reflux She complains of belching and heartburn. This is a chronic problem. The current episode started more than 1 year ago. The problem occurs occasionally. She has tried a PPI for the symptoms. The treatment provided moderate relief.  Diabetes She presents for her follow-up diabetic visit. She has type 2 diabetes mellitus. Pertinent negatives for diabetes include no blurred vision and no foot paresthesias. Risk factors for coronary artery disease include dyslipidemia, diabetes mellitus, hypertension, post-menopausal and sedentary lifestyle. She is following a generally healthy diet. Her overall blood glucose range is 140-180 mg/dl.  Hyperlipidemia This is a chronic problem. The current episode started more than 1 year ago. The problem is controlled. Pertinent negatives include no shortness of breath. Current antihyperlipidemic treatment includes statins. The current treatment provides moderate improvement of  lipids. Risk factors for coronary artery disease include diabetes mellitus, dyslipidemia, hypertension and a sedentary lifestyle.      Review of Systems  Constitutional:  Negative for malaise/fatigue.  Eyes:  Negative for blurred vision.  Respiratory:  Negative for shortness of breath.   Gastrointestinal:  Positive for heartburn.  All other systems reviewed and are negative.      Objective:   Physical Exam Vitals reviewed.  Constitutional:      General: She is not in acute distress.    Appearance: She is well-developed.  HENT:     Head: Normocephalic and atraumatic.     Right Ear: Tympanic membrane normal.     Left Ear: Tympanic membrane normal.  Eyes:     Pupils: Pupils are equal, round, and reactive to light.  Neck:     Thyroid: No thyromegaly.  Cardiovascular:     Rate and Rhythm: Normal rate and regular rhythm.     Heart sounds: Normal heart sounds. No murmur heard. Pulmonary:     Effort: Pulmonary effort is normal. No respiratory distress.     Breath sounds: Normal breath sounds. No wheezing.  Abdominal:     General: Bowel sounds are normal. There is no distension.     Palpations: Abdomen is soft.     Tenderness: There is no abdominal tenderness.  Musculoskeletal:        General: No tenderness. Normal range of motion.     Cervical back: Normal range of motion and neck supple.  Skin:    General: Skin is warm and dry.  Neurological:     Mental Status: She is alert and oriented to person, place, and time.     Cranial Nerves:  No cranial nerve deficit.     Deep Tendon Reflexes: Reflexes are normal and symmetric.  Psychiatric:        Behavior: Behavior normal.        Thought Content: Thought content normal.        Judgment: Judgment normal.       BP 124/60   Pulse 92   Temp (!) 97.2 F (36.2 C) (Temporal)   Ht 5\' 2"  (1.575 m)   Wt 130 lb 9.6 oz (59.2 kg)   SpO2 99%   BMI 23.89 kg/m      Assessment & Plan:  Erica Braun comes in today with  chief complaint of Medical Management of Chronic Issues   Diagnosis and orders addressed:  1. Type 2 diabetes mellitus with other specified complication, without long-term current use of insulin - Bayer DCA Hb A1c Waived - CMP14+EGFR - CBC with Differential/Platelet  2. Hypertension associated with diabetes - CMP14+EGFR - CBC with Differential/Platelet  3. Gastroesophageal reflux disease with esophagitis without hemorrhage - CMP14+EGFR - CBC with Differential/Platelet  4. Hyperlipidemia associated with type 2 diabetes mellitus - CMP14+EGFR - CBC with Differential/Platelet  5. Panlobular emphysema - CMP14+EGFR - CBC with Differential/Platelet  6. CAD in native artery - CMP14+EGFR - CBC with Differential/Platelet  7. History of esophageal cancer - CMP14+EGFR - CBC with Differential/Platelet  8. OSA on CPAP - CMP14+EGFR - CBC with Differential/Platelet  9. Coronary artery disease of native artery of native heart with stable angina pectoris - CMP14+EGFR - CBC with Differential/Platelet  10. Hx of smoking - CMP14+EGFR - CBC with Differential/Platelet - Ambulatory Referral Lung Cancer Screening [facility]   Labs pending Health Maintenance reviewed Diet and exercise encouraged  Follow up plan: 6 months    [name], [name]

## 2022-04-15 LAB — CMP14+EGFR
ALT: 12 IU/L (ref 0–32)
AST: 13 IU/L (ref 0–40)
Albumin/Globulin Ratio: 1.8 (ref 1.2–2.2)
Albumin: 4.3 g/dL (ref 3.8–4.8)
Alkaline Phosphatase: 105 IU/L (ref 44–121)
BUN/Creatinine Ratio: 16 (ref 12–28)
BUN: 13 mg/dL (ref 8–27)
Bilirubin Total: 0.3 mg/dL (ref 0.0–1.2)
CO2: 22 mmol/L (ref 20–29)
Calcium: 9.4 mg/dL (ref 8.7–10.3)
Chloride: 103 mmol/L (ref 96–106)
Creatinine, Ser: 0.79 mg/dL (ref 0.57–1.00)
Globulin, Total: 2.4 g/dL (ref 1.5–4.5)
Glucose: 142 mg/dL — ABNORMAL HIGH (ref 70–99)
Potassium: 4.2 mmol/L (ref 3.5–5.2)
Sodium: 143 mmol/L (ref 134–144)
Total Protein: 6.7 g/dL (ref 6.0–8.5)
eGFR: 79 mL/min/{1.73_m2} (ref >59)

## 2022-04-15 LAB — CBC WITH DIFFERENTIAL/PLATELET
Basophils Absolute: 0 10*3/uL (ref 0.0–0.2)
Basos: 1 %
EOS (ABSOLUTE): 0.1 10*3/uL (ref 0.0–0.4)
Eos: 2 %
Hematocrit: 36.6 % (ref 34.0–46.6)
Hemoglobin: 11.3 g/dL (ref 11.1–15.9)
Immature Grans (Abs): 0 10*3/uL (ref 0.0–0.1)
Immature Granulocytes: 0 %
Lymphocytes Absolute: 0.8 10*3/uL (ref 0.7–3.1)
Lymphs: 21 %
MCH: 26.3 pg — ABNORMAL LOW (ref 26.6–33.0)
MCHC: 30.9 g/dL — ABNORMAL LOW (ref 31.5–35.7)
MCV: 85 fL (ref 79–97)
Monocytes Absolute: 0.4 10*3/uL (ref 0.1–0.9)
Monocytes: 10 %
Neutrophils Absolute: 2.5 10*3/uL (ref 1.4–7.0)
Neutrophils: 66 %
Platelets: 141 10*3/uL — ABNORMAL LOW (ref 150–450)
RBC: 4.3 x10E6/uL (ref 3.77–5.28)
RDW: 15.6 % — ABNORMAL HIGH (ref 11.7–15.4)
WBC: 3.7 10*3/uL (ref 3.4–10.8)

## 2022-05-07 ENCOUNTER — Other Ambulatory Visit: Payer: Self-pay | Admitting: Family

## 2022-05-28 ENCOUNTER — Other Ambulatory Visit: Payer: Self-pay | Admitting: Family

## 2022-05-28 DIAGNOSIS — E119 Type 2 diabetes mellitus without complications: Secondary | ICD-10-CM

## 2022-06-23 NOTE — Progress Notes (Signed)
CARDIOLOGY CONSULT NOTE       Patient ID: Erica Braun MRN: [id number] DOB/AGE: Mar 14, 1950 72 y.o.  Referring Physician: Craige Cotta Primary Physician: Junie Spencer, [name] Primary Cardiologist: Eden Emms   HPI:  72 y.o. referred by Dr Craige Cotta for chest pain and dyspnea. First seen on 07/10/21  Sees pulmonary for asthma/COPD Quit smoking 12 years ago Uses CPAP For OSA  Has history of stage one esophageal cancer followed at [facility] Last EGD 01/2021 with no cancer and dilatation performed She had some chest symptoms on diagnosis of this She is diabetic with HTN and HLD.    Previously seen by Dr Darl Householder Cath by Dr Katrinka Blazing 06/12/17 with CTO RCA with left to right collaterals and 60% D1 stenosis EF 55% Medical Rx recommended She was prescribed imdur but for some reason this was stopped on her last pulmonary visit Prior to cath had an abnormal myovue with moderate mixed defect in inferior wall EF 52%   She has been having exertional dyspnea but denies chest pain Dyspnea better after pollen season and when not in humidity like when she saw her brother in [place] who owns a vinyard Has OSA and uses CPAP  Lipitor giving her bad leg pains Changed to crestor July 2023 improved and LDL at goal 69 12/10/21   Still with some dysphagia from esophageal cancer   She is being w/u for abnormal right breast mass Had right breast lumpectomy with Dr Luisa Hart 01/15/22   No angina. HR runs high Had a couple of episodes of blurred vision sitting down She checked her vitals and they were ok but HR in 90's at home to She use to be on metroprolol not clear why it was stopped   ROS All other systems reviewed and negative except as noted above  Past Medical History:  Diagnosis Date   Complication of anesthesia    COPD (chronic obstructive pulmonary disease) (HCC)    Coronary artery disease    Diabetes mellitus without complication (HCC)    Diverticulosis    Early cataracts, bilateral 11/21/2016   Emphysema of lung (HCC)     Esophageal cancer (HCC)    GERD (gastroesophageal reflux disease)    Glaucoma    patient denies   Hyperlipidemia    Hypertension    Internal hemorrhoids    Osteoporosis 12/16/2021   Pneumonia    PONV (postoperative nausea and vomiting)    Sleep apnea    uses Cpap   Spasm of the cricopharyngeus muscle    Squamous cell esophageal cancer (HCC) 05/10/2018    Family History  Problem Relation Age of Onset   Breast cancer Mother        dx 27s; mets   Arthritis Mother    COPD Father 43       emphysema   Alcohol abuse Father    Diabetes Father    Breast cancer Sister        dx late 54s; mat half sister   Early death Brother        overdose   Drug abuse Brother    Alcohol abuse Paternal Aunt    COPD Paternal Aunt    Throat cancer Paternal Uncle    Liver cancer Maternal Grandmother        or other primary; d. late 33s   Diabetes Maternal Grandmother    Alcohol abuse Maternal Grandfather    Early death Maternal Grandfather    Stroke Paternal Grandmother    Heart disease Paternal Grandfather  Esophageal cancer Neg Hx    Stomach cancer Neg Hx    Colon cancer Neg Hx     Social History   Socioeconomic History   Marital status: Married    Spouse name: [name]   Number of children: 3   Years of education: 14   Highest education level: Some college, no degree  Occupational History   Occupation: retired    Comment: Radiation protection practitioner  Tobacco Use   Smoking status: Former    Packs/day: 1.50    Years: 41.00    Additional pack years: 0.00    Total pack years: 61.50    Types: Cigarettes    Start date: 1969    Quit date: 2010    Years since quitting: 14.4   Smokeless tobacco: Never  Vaping Use   Vaping Use: Never used  Substance and Sexual Activity   Alcohol use: Yes    Comment: wine occasionaly   Drug use: No   Sexual activity: Yes    Birth control/protection: Post-menopausal  Other Topics Concern   Not on file  Social History Narrative   Recent move from [place] to walk   Married to [name] dog at home   Social Determinants of [employer]: Low Risk  (12/09/2021)   Overall Financial Resource Strain (CARDIA)    Difficulty of Paying Living Expenses: Not hard at all  Food Insecurity: No Food Insecurity (12/09/2021)   Hunger Vital Sign    Worried About Running Out of Food in the Last Year: Never true    Ran Out of Food in the Last Year: Never true  Transportation Needs: No Transportation Needs (12/09/2021)   PRAPARE - Administrator, Civil Service (Medical): No    Lack of Transportation (Non-Medical): No  Physical Activity: Insufficiently Active (12/09/2021)   Exercise Vital Sign    Days of Exercise per Week: 3 days    Minutes of Exercise per Session: 30 min  Stress: No Stress Concern Present (12/09/2021)   [employer] of Occupational Health - Occupational Stress Questionnaire    Feeling of Stress : Only a little  Social Connections: Socially Integrated (12/09/2021)   Social Connection and Isolation Panel [NHANES]    Frequency of Communication with Friends and Family: More than three times a week    Frequency of Social Gatherings with Friends and Family: More than three times a week    Attends Religious Services: More than 4 times per year    Active Member of Clubs or Organizations: Yes    Attends Banker Meetings: More than 4 times per year    Marital Status: Married  Catering manager Violence: Not At Risk (12/09/2021)   Humiliation, Afraid, Rape, and Kick questionnaire    Fear of Current or Ex-Partner: No    Emotionally Abused: No    Physically Abused: No    Sexually Abused: No    Past Surgical History:  Procedure Laterality Date   ABDOMINAL HYSTERECTOMY     cervical dysplasia   BIOPSY  05/03/2018   Procedure: BIOPSY;  Surgeon: Meridee Score Netty Starring., MD;  Location: [facility] ENDOSCOPY;  Service: Gastroenterology;;   BREAST BIOPSY Left    BREAST BIOPSY Right 11/27/2021   MM RT  BREAST BX W LOC DEV 1ST LESION IMAGE BX SPEC STEREO GUIDE 11/27/2021 [facility] MAMMOGRAPHY   BREAST BIOPSY  01/14/2022   MM RT RADIOACTIVE SEED LOC MAMMO GUIDE 01/14/2022 [facility] MAMMOGR[facility]   BREAST  LUMPECTOMY Left    BREAST LUMPECTOMY WITH RADIOACTIVE SEED LOCALIZATION Right 01/15/2022   Procedure: RIGHT BREAST LUMPECTOMY WITH RADIOACTIVE SEED LOCALIZATION;  Surgeon: Harriette Bouillon, MD;  Location: [facility] OR;  Service: General;  Laterality: Right;   ESOPHAGOGASTRODUODENOSCOPY (EGD) WITH PROPOFOL N/A 05/03/2018   Procedure: ESOPHAGOGASTRODUODENOSCOPY (EGD) WITH PROPOFOL;  Surgeon: Lemar Lofty., MD;  Location[facility] ENDOSCOPY;  Service: Gastroenterology;  Laterality: N/A;   ESOPHAGOGASTRODUODENOSCOPY (EGD) WITH PROPOFOL N/A 11/18/2018   Procedure: ESOPHAGOGASTRODUODENOSCOPY (EGD) WITH PROPOFOL;  Surgeon: Hilarie Fredrickson, MD;  Location: [facility] ENDOSCOPY;  Service: Endoscopy;  Laterality: N/A;   EUS  05/03/2018   Procedure: UPPER ENDOSCOPIC ULTRASOUND (EUS) RADIAL;  Surgeon: Lemar Lofty., MD;  Location: [facility] ENDOSCOPY;  Service: Gastroenterology;;   FOREIGN BODY REMOVAL  11/18/2018   Procedure: FOREIGN BODY REMOVAL;  Surgeon: Hilarie Fredrickson, MD;  Location: [facility] ENDOSCOPY;  Service: Endoscopy;;   LEFT HEART CATH AND CORONARY ANGIOGRAPHY N/A 06/11/2017   Procedure: LEFT HEART CATH AND CORONARY ANGIOGRAPHY;  Surgeon: Lyn Records, MD;  Location: [facility] INVASIVE CV LAB;  Service: Cardiovascular;  Laterality: N/A;      Current Outpatient Medications:    albuterol (PROVENTIL) (2.5 MG/3ML) 0.083% nebulizer solution, Take 3 mLs (2.5 mg total) by nebulization every 6 (six) hours as needed for wheezing or shortness of breath., Disp: 75 mL, Rfl: 3   alendronate (FOSAMAX) 70 MG tablet, Take 1 tablet (70 mg total) by mouth every 7 (seven) days. Take with a full glass of water on an empty stomach. (Patient taking differently: Take 70 mg by mouth every Saturday. Take with a full glass of water on an empty stomach.), Disp:  4 tablet, Rfl: 11   aspirin EC 81 MG tablet, Take 81 mg by mouth in the morning., Disp: , Rfl:    Blood Glucose Monitor Software DEVI, Test BS BID and PRN Dx E11.9, Disp: 1 Device, Rfl: 1   calcium carbonate (OSCAL) 1500 (600 Ca) MG TABS tablet, Take 600 mg by mouth in the morning and at bedtime., Disp: , Rfl:    Cholecalciferol (VITAMIN D-3 PO), Take 5,000 Units by mouth in the morning., Disp: , Rfl:    empagliflozin (JARDIANCE) 10 MG TABS tablet, TAKE 1 TABLET BY MOUTH ONCE DAILY BEFORE BREAKFAST, Disp: 90 tablet, Rfl: 1   famotidine (PEPCID) 40 MG tablet, Take 1 tablet (40 mg total) by mouth at bedtime., Disp: 90 tablet, Rfl: 3   fluconazole (DIFLUCAN) 150 MG tablet, Take 1 tablet (150 mg total) by mouth every three (3) days as needed., Disp: 3 tablet, Rfl: 0   fluticasone-salmeterol (ADVAIR) 250-50 MCG/ACT AEPB, Inhale 1 puff into the lungs every 12 (twelve) hours., Disp: 60 each, Rfl: 11   glimepiride (AMARYL) 2 MG tablet, Take 2 mg by mouth daily as needed (blood sugar more than 200.)., Disp: , Rfl:    glipiZIDE (GLUCOTROL XL) 2.5 MG 24 hr tablet, Take 1 tablet (2.5 mg total) by mouth daily with breakfast., Disp: 90 tablet, Rfl: 1   glucose blood test strip, test BS BID Dx E11.9, Disp: 200 each, Rfl: 3   isosorbide mononitrate (IMDUR) 30 MG 24 hr tablet, Take 0.5 tablets (15 mg total) by mouth daily., Disp: 45 tablet, Rfl: 3   losartan (COZAAR) 25 MG tablet, Take 1 tablet by mouth once daily, Disp: 90 tablet, Rfl: 0   metFORMIN (GLUCOPHAGE) 1000 MG tablet, TAKE 1 TABLET BY MOUTH TWICE DAILY WITH A MEAL, Disp: 200 tablet, Rfl: 0   Multiple Vitamins-Minerals (ADULT ONE  DAILY GUMMIES PO), Take 2 tablets by mouth in the morning. Women's Multi by [employer], Disp: , Rfl:    nitroGLYCERIN (NITROSTAT) 0.4 MG SL tablet, Place 1 tablet (0.4 mg total) under the tongue every 5 (five) minutes as needed for chest pain., Disp: 25 tablet, Rfl: 3   omeprazole (PRILOSEC) 40 MG capsule, TAKE 1 CAPSULE BY MOUTH  TWICE DAILY BEFORE A MEAL, Disp: 180 capsule, Rfl: 0   oxyCODONE (OXY IR/ROXICODONE) 5 MG immediate release tablet, Take 1 tablet (5 mg total) by mouth every 6 (six) hours as needed for severe pain., Disp: 15 tablet, Rfl: 0   predniSONE (STERAPRED UNI-PAK 21 TAB) 10 MG (21) TBPK tablet, Use as directed on back of pill pack, Disp: 21 tablet, Rfl: 0   rosuvastatin (CRESTOR) 10 MG tablet, Take 1 tablet (10 mg total) by mouth daily. (Patient taking differently: Take 10 mg by mouth every evening.), Disp: 90 tablet, Rfl: 3    Physical Exam: There were no vitals taken for this visit.   Affect appropriate Healthy:  appears stated age HEENT: normal Neck supple with no adenopathy JVP normal no bruits no thyromegaly Lungs clear with no wheezing and good diaphragmatic motion Heart:  S1/S2 no murmur, no rub, gallop or click PMI normal Abdomen: benighn, BS positve, no tenderness, no AAA no bruit.  No HSM or HJR Distal pulses intact with no bruits No edema Neuro non-focal Skin warm and dry No muscular weakness   Labs:   Lab Results  Component Value Date   WBC 3.7 04/14/2022   HGB 11.3 04/14/2022   HCT 36.6 04/14/2022   MCV 85 04/14/2022   PLT 141 (L) 04/14/2022   No results for input(s): "NA", "K", "CL", "CO2", "BUN", "CREATININE", "CALCIUM", "PROT", "BILITOT", "ALKPHOS", "ALT", "AST", "GLUCOSE" in the last 168 hours.  Invalid input(s): "LABALBU" No results found for: "CKTOTAL", "CKMB", "CKMBINDEX", "TROPONINI"  Lab Results  Component Value Date   CHOL 147 12/10/2021   CHOL 126 09/10/2021   CHOL 203 (H) 10/25/2020   Lab Results  Component Value Date   HDL 64 12/10/2021   HDL 54 09/10/2021   HDL 52 10/25/2020   Lab Results  Component Value Date   LDLCALC 69 12/10/2021   LDLCALC 49 09/10/2021   LDLCALC 124 (H) 10/25/2020   Lab Results  Component Value Date   TRIG 72 12/10/2021   TRIG 135 09/10/2021   TRIG 154 (H) 10/25/2020   Lab Results  Component Value Date    CHOLHDL 2.3 12/10/2021   CHOLHDL 2.3 09/10/2021   CHOLHDL 3.9 10/25/2020   No results found for: "LDLDIRECT"    Radiology: No results found.  EKG: ST rate 133 no acute ST changes 11/19/18  06/23/2022 SR normal    ASSESSMENT AND PLAN:   CAD:  cath 2019 with CTO RCA left to right collaterals and 60% D1 stenosis Currently on ASA/Statin Change to crestor add back imdur 15 mg daily No angina continue medical Rx  Add Toprol 25 mg daily given relative tachycardia  COPD:  f/u [facility] no recent CXR/CT   Albuterol inhaler not wheezing no contraindication to beta blocker Esophageal Cancer:  F/U [facility] on prilosec and pepcid Last EGD 2021 should have f/u with Dr Rhea Belton ? [facility] HTN:  continue losartan given DM HLD:  LDL 69 at goal continue statin  DM:  Discussed low carb diet.  Target hemoglobin A1c is 6.5 or less.  Continue current medications.  A1c 7.0 10/25/20  Breast Cancer:  post right lumpectomy  pathology negative for cancer  Blurred Vision: with funny moving sensation She has had vertigo in past but this was slightly different ECG with no afib and she indicates pulse / BP fine during episode Will check carotids    Carotid duplex Toprol 25 mg daily   F/U in 6 months   Signed: Charlton Haws 06/23/2022, 3:16 PM

## 2022-07-04 ENCOUNTER — Encounter: Payer: Self-pay | Admitting: Cardiovascular Disease

## 2022-07-04 ENCOUNTER — Ambulatory Visit: Payer: PPO | Attending: Cardiovascular Disease | Admitting: Cardiovascular Disease

## 2022-07-04 VITALS — BP 122/78 | HR 99 | Ht 62.0 in | Wt 132.0 lb

## 2022-07-04 DIAGNOSIS — E785 Hyperlipidemia, unspecified: Secondary | ICD-10-CM

## 2022-07-04 DIAGNOSIS — G459 Transient cerebral ischemic attack, unspecified: Secondary | ICD-10-CM | POA: Diagnosis not present

## 2022-07-04 DIAGNOSIS — I2510 Atherosclerotic heart disease of native coronary artery without angina pectoris: Secondary | ICD-10-CM | POA: Diagnosis not present

## 2022-07-04 DIAGNOSIS — E1169 Type 2 diabetes mellitus with other specified complication: Secondary | ICD-10-CM

## 2022-07-04 DIAGNOSIS — I10 Essential (primary) hypertension: Secondary | ICD-10-CM | POA: Diagnosis not present

## 2022-07-04 MED ORDER — NITROGLYCERIN 0.4 MG SL SUBL: 0.4000 mg | SUBLINGUAL_TABLET | SUBLINGUAL | 3 refills | Status: AC | PRN

## 2022-07-04 MED ORDER — METOPROLOL SUCCINATE ER 25 MG PO TB24: 25.0000 mg | ORAL_TABLET | Freq: Every day | ORAL | 3 refills | Status: DC

## 2022-07-04 NOTE — Patient Instructions (Signed)
Medication Instructions:  START Toprol XL 25 mg daily  Labwork: None today  Testing/Procedures: Your physician has requested that you have a carotid duplex. This test is an ultrasound of the carotid arteries in your neck. It looks at blood flow through these arteries that supply the brain with blood. Allow one hour for this exam. There are no restrictions or special instructions.   Follow-Up: 6 months Dr.Nishan  Any Other Special Instructions Will Be Listed Below (If Applicable).  If you need a refill on your cardiac medications before your next appointment, please call your pharmacy.

## 2022-07-15 ENCOUNTER — Telehealth: Payer: Self-pay | Admitting: Cardiovascular Disease

## 2022-07-15 ENCOUNTER — Ambulatory Visit (HOSPITAL_COMMUNITY)
Admission: RE | Admit: 2022-07-15 | Discharge: 2022-07-15 | Disposition: A | Discharge status: Home / Self Care | Payer: PPO | Source: Ambulatory Visit | Attending: Cardiovascular Disease | Admitting: Cardiovascular Disease

## 2022-07-15 DIAGNOSIS — I6523 Occlusion and stenosis of bilateral carotid arteries: Secondary | ICD-10-CM | POA: Diagnosis not present

## 2022-07-15 DIAGNOSIS — E119 Type 2 diabetes mellitus without complications: Secondary | ICD-10-CM | POA: Diagnosis not present

## 2022-07-15 DIAGNOSIS — I10 Essential (primary) hypertension: Secondary | ICD-10-CM | POA: Diagnosis not present

## 2022-07-15 DIAGNOSIS — G459 Transient cerebral ischemic attack, unspecified: Secondary | ICD-10-CM | POA: Diagnosis not present

## 2022-07-15 DIAGNOSIS — E785 Hyperlipidemia, unspecified: Secondary | ICD-10-CM | POA: Diagnosis not present

## 2022-07-15 NOTE — Telephone Encounter (Signed)
Spoke with Tiffany who states that carotid US preformed today has been read and is available in Epic. Will route to Dr. Eden Emms.

## 2022-07-15 NOTE — Telephone Encounter (Signed)
Erica Braun - [facility] calling to give result report for carotid

## 2022-07-20 ENCOUNTER — Other Ambulatory Visit: Payer: Self-pay | Admitting: Family

## 2022-07-20 DIAGNOSIS — K2100 Gastro-esophageal reflux disease with esophagitis, without bleeding: Secondary | ICD-10-CM

## 2022-07-29 ENCOUNTER — Other Ambulatory Visit: Payer: Self-pay | Admitting: *Deleted

## 2022-07-29 DIAGNOSIS — Z87891 Personal history of nicotine dependence: Secondary | ICD-10-CM

## 2022-07-29 DIAGNOSIS — Z122 Encounter for screening for malignant neoplasm of respiratory organs: Secondary | ICD-10-CM

## 2022-09-03 ENCOUNTER — Other Ambulatory Visit: Payer: Self-pay | Admitting: Cardiovascular Disease

## 2022-09-16 ENCOUNTER — Ambulatory Visit (HOSPITAL_COMMUNITY)
Admission: RE | Admit: 2022-09-16 | Discharge: 2022-09-16 | Disposition: A | Discharge status: Home / Self Care | Payer: PPO | Source: Ambulatory Visit | Attending: Acute Care | Admitting: Acute Care

## 2022-09-16 DIAGNOSIS — Z87891 Personal history of nicotine dependence: Secondary | ICD-10-CM | POA: Diagnosis not present

## 2022-09-16 DIAGNOSIS — Z122 Encounter for screening for malignant neoplasm of respiratory organs: Secondary | ICD-10-CM | POA: Diagnosis not present

## 2022-09-29 ENCOUNTER — Telehealth: Payer: Self-pay | Admitting: *Deleted

## 2022-09-29 ENCOUNTER — Other Ambulatory Visit: Payer: Self-pay | Admitting: Family

## 2022-09-29 DIAGNOSIS — R911 Solitary pulmonary nodule: Secondary | ICD-10-CM

## 2022-09-29 NOTE — Telephone Encounter (Signed)
Spoke with Erica Braun at [facility] for call report:  IMPRESSION: 1. Lung-RADS 4A, suspicious. New bilateral irregular nodular opacities identified in each lung measuring up to 7.0 mm. Follow up low-dose chest CT without contrast in 3 months (please use the following order, "CT CHEST LCS NODULE FOLLOW-UP W/O CM") is recommended. Alternatively, PET may be considered when there is a solid component 8mm or larger. 2. Nonobstructing right nephrolithiasis. 3.  Emphysema (ICD10-J43.9) and Aortic Atherosclerosis (ICD10-170.0)

## 2022-09-29 NOTE — Telephone Encounter (Signed)
[facility] calling with Call Report. [phone]

## 2022-09-30 ENCOUNTER — Other Ambulatory Visit: Payer: Self-pay | Admitting: Family

## 2022-09-30 DIAGNOSIS — R911 Solitary pulmonary nodule: Secondary | ICD-10-CM

## 2022-09-30 NOTE — Telephone Encounter (Signed)
Please review for refill  Last seen 09/16/22  Next appt is not scheduled

## 2022-09-30 NOTE — Addendum Note (Signed)
Addended by: [name] A on: 09/30/2022 01:50 PM   Modules accepted: Orders

## 2022-10-03 ENCOUNTER — Telehealth: Payer: Self-pay | Admitting: Acute Care

## 2022-10-03 DIAGNOSIS — R911 Solitary pulmonary nodule: Secondary | ICD-10-CM

## 2022-10-03 DIAGNOSIS — Z87891 Personal history of nicotine dependence: Secondary | ICD-10-CM

## 2022-10-03 NOTE — Telephone Encounter (Signed)
I have attempted to call the patient with the results of her low-dose screening CT.  Her scan was read as a lung RADS 4, suspicious.  There are new bilateral irregular nodular opacities in each lung that measure up to 7 mm.  Size of nodules is below PET avidity therefore recommendation per radiology is for a 12-month follow-up which will be due after December 16, 2022. Please call patient and let her know plan is for 34-month follow-up low-dose screening CT which be due in December of this year.  Please fax results to PCP and let them know plan for follow-up.  Thank you so much

## 2022-10-07 ENCOUNTER — Other Ambulatory Visit: Payer: Self-pay | Admitting: Cardiovascular Disease

## 2022-10-14 NOTE — Telephone Encounter (Signed)
Spoke with pt and advised of small nodules noted on lung screening CT scan. Advised that we will repeat CT in 3 months to reassess nodules. Pt verbalized understanding and is aware we will call her closer to 3 months to schedule CT. Order placed for 3 month nodule f/u CT.

## 2022-10-28 DIAGNOSIS — G4733 Obstructive sleep apnea (adult) (pediatric): Secondary | ICD-10-CM | POA: Diagnosis not present

## 2022-11-05 ENCOUNTER — Other Ambulatory Visit: Payer: Self-pay | Admitting: Family

## 2022-11-18 ENCOUNTER — Ambulatory Visit: Payer: PPO | Admitting: *Deleted

## 2022-11-18 DIAGNOSIS — E1169 Type 2 diabetes mellitus with other specified complication: Secondary | ICD-10-CM

## 2022-11-18 LAB — HM DIABETES EYE EXAM

## 2022-11-18 NOTE — Progress Notes (Signed)
Erica Braun arrived 11/18/2022 and has given verbal consent to obtain images and complete their overdue diabetic retinal screening.  The images have been sent to an ophthalmologist or optometrist for review and interpretation.  Results will be sent back to Erica Spencer, [name] for review.  Patient has been informed they will be contacted when we receive the results via telephone or MyChart

## 2022-11-24 NOTE — Telephone Encounter (Signed)
Copied from CRM [id number]. Topic: General - Call Back - No Documentation >> Nov 24, 2022  2:21 PM Theodis Sato wrote: Reason for CRM: PT states she missed a call from [place] and would like her to call back again when she gets a chance.

## 2022-12-01 ENCOUNTER — Other Ambulatory Visit: Payer: Self-pay | Admitting: Family

## 2022-12-01 MED ORDER — EMPAGLIFLOZIN 10 MG PO TABS: 10.0000 mg | ORAL_TABLET | Freq: Every day | ORAL | 0 refills | Status: DC

## 2022-12-01 NOTE — Addendum Note (Signed)
Addended by: Julious Payer D on: 12/01/2022 02:28 PM   Modules accepted: Orders

## 2022-12-01 NOTE — Telephone Encounter (Signed)
Patient has appt 12-26 with Christy.

## 2022-12-01 NOTE — Telephone Encounter (Signed)
[facility] pt NTBS 30-d given 11/05/22

## 2022-12-16 ENCOUNTER — Encounter (HOSPITAL_COMMUNITY): Payer: PPO

## 2022-12-25 ENCOUNTER — Ambulatory Visit (HOSPITAL_COMMUNITY)
Admission: RE | Admit: 2022-12-25 | Discharge: 2022-12-25 | Disposition: A | Discharge status: Home / Self Care | Payer: PPO | Source: Ambulatory Visit | Attending: Family | Admitting: Family

## 2022-12-25 ENCOUNTER — Encounter (HOSPITAL_COMMUNITY): Payer: Self-pay

## 2022-12-25 ENCOUNTER — Ambulatory Visit (HOSPITAL_COMMUNITY)
Admission: RE | Admit: 2022-12-25 | Discharge: 2022-12-25 | Disposition: A | Discharge status: Home / Self Care | Payer: PPO | Source: Ambulatory Visit | Attending: Acute Care | Admitting: Acute Care

## 2022-12-25 DIAGNOSIS — Z87891 Personal history of nicotine dependence: Secondary | ICD-10-CM | POA: Insufficient documentation

## 2022-12-25 DIAGNOSIS — R911 Solitary pulmonary nodule: Secondary | ICD-10-CM

## 2022-12-26 DIAGNOSIS — F17210 Nicotine dependence, cigarettes, uncomplicated: Secondary | ICD-10-CM | POA: Diagnosis not present

## 2023-01-01 ENCOUNTER — Encounter: Payer: Self-pay | Admitting: Family

## 2023-01-01 ENCOUNTER — Telehealth: Payer: PPO | Admitting: Family

## 2023-01-01 DIAGNOSIS — J431 Panlobular emphysema: Secondary | ICD-10-CM | POA: Diagnosis not present

## 2023-01-01 DIAGNOSIS — Z7984 Long term (current) use of oral hypoglycemic drugs: Secondary | ICD-10-CM | POA: Diagnosis not present

## 2023-01-01 DIAGNOSIS — B3731 Acute candidiasis of vulva and vagina: Secondary | ICD-10-CM

## 2023-01-01 DIAGNOSIS — G4733 Obstructive sleep apnea (adult) (pediatric): Secondary | ICD-10-CM

## 2023-01-01 DIAGNOSIS — E1169 Type 2 diabetes mellitus with other specified complication: Secondary | ICD-10-CM

## 2023-01-01 DIAGNOSIS — K2100 Gastro-esophageal reflux disease with esophagitis, without bleeding: Secondary | ICD-10-CM | POA: Diagnosis not present

## 2023-01-01 DIAGNOSIS — I152 Hypertension secondary to endocrine disorders: Secondary | ICD-10-CM

## 2023-01-01 DIAGNOSIS — E1159 Type 2 diabetes mellitus with other circulatory complications: Secondary | ICD-10-CM

## 2023-01-01 DIAGNOSIS — E119 Type 2 diabetes mellitus without complications: Secondary | ICD-10-CM

## 2023-01-01 DIAGNOSIS — E785 Hyperlipidemia, unspecified: Secondary | ICD-10-CM | POA: Diagnosis not present

## 2023-01-01 MED ORDER — EMPAGLIFLOZIN 10 MG PO TABS: 10.0000 mg | ORAL_TABLET | Freq: Every day | ORAL | 0 refills | Status: DC

## 2023-01-01 MED ORDER — METFORMIN HCL 1000 MG PO TABS: 1000.0000 mg | ORAL_TABLET | Freq: Two times a day (BID) | ORAL | 0 refills | Status: DC

## 2023-01-01 MED ORDER — FAMOTIDINE 40 MG PO TABS: 40.0000 mg | ORAL_TABLET | Freq: Every day | ORAL | 3 refills | Status: DC

## 2023-01-01 MED ORDER — GLIMEPIRIDE 2 MG PO TABS: 2.0000 mg | ORAL_TABLET | Freq: Every day | ORAL | 1 refills | Status: DC | PRN

## 2023-01-01 MED ORDER — OMEPRAZOLE 40 MG PO CPDR: 40.0000 mg | DELAYED_RELEASE_CAPSULE | Freq: Every day | ORAL | 0 refills | Status: DC

## 2023-01-01 MED ORDER — FLUCONAZOLE 150 MG PO TABS: 150.0000 mg | ORAL_TABLET | ORAL | 0 refills | Status: DC | PRN

## 2023-01-01 NOTE — Progress Notes (Signed)
Virtual Visit Consent   Erica Braun, you are scheduled for a virtual visit with a [facility] provider today. Just as with appointments in the office, your consent must be obtained to participate. Your consent will be active for this visit and any virtual visit you may have with one of our providers in the next 365 days. If you have a MyChart account, a copy of this consent can be sent to you electronically.  As this is a virtual visit, video technology does not allow for your provider to perform a traditional examination. This may limit your provider's ability to fully assess your condition. If your provider identifies any concerns that need to be evaluated in person or the need to arrange testing (such as labs, EKG, etc.), we will make arrangements to do so. Although advances in technology are sophisticated, we cannot ensure that it will always work on either your end or our end. If the connection with a video visit is poor, the visit may have to be switched to a telephone visit. With either a video or telephone visit, we are not always able to ensure that we have a secure connection.  By engaging in this virtual visit, you consent to the provision of healthcare and authorize for your insurance to be billed (if applicable) for the services provided during this visit. Depending on your insurance coverage, you may receive a charge related to this service.  I need to obtain your verbal consent now. Are you willing to proceed with your visit today? Erica Braun has provided verbal consent on 01/01/2023 for a virtual visit (video or telephone). Erica Rodney, [name]  Date: 01/01/2023 2:23 PM  Virtual Visit via Video Note   I, Erica Braun, connected with  Erica Braun  ([id number], 72) on 01/01/23 at  2:10 PM EST by a video-enabled telemedicine application and verified that I am speaking with the correct person using two identifiers.  Location: Patient:  Virtual Visit Location Patient: Home Provider: Virtual Visit Location Provider: Home Office   I discussed the limitations of evaluation and management by telemedicine and the availability of in person appointments. The patient expressed understanding and agreed to proceed.    History of Present Illness: Erica Braun is a 72 y.o. who identifies as a female who was assigned female at birth, and is being seen today for chronic follow up. She is followed by Cardiologists for CAD, pulmonologist's for COPD, and GI for esophageal dysphagia and hx of squamous cell esophageal cancer. These are stable.    CAD and takes Crestor daily.     Has COPD and quit smoking approx 2011. Has intermittent SOB that is worse in the heat.    Has OSA and uses CPAP nightly. Marland Kitchen  HPI: Diabetes She presents for her follow-up diabetic visit. She has type 2 diabetes mellitus. Pertinent negatives for diabetes include no blurred vision and no foot paresthesias. Symptoms are stable. Pertinent negatives for diabetic complications include no peripheral neuropathy. Risk factors for coronary artery disease include dyslipidemia, diabetes mellitus, hypertension, sedentary lifestyle and post-menopausal. She is following a generally unhealthy diet. Her overall blood glucose range is 140-180 mg/dl. Eye exam is current.  Hypertension This is a chronic problem. The current episode started more than 1 year ago. The problem has been resolved since onset. The problem is controlled. Pertinent negatives include no blurred vision, malaise/fatigue, peripheral edema or shortness of breath. Risk factors for coronary artery disease include dyslipidemia, diabetes mellitus, obesity and sedentary lifestyle.  The current treatment provides moderate improvement.  Gastroesophageal Reflux She complains of belching and heartburn. This is a chronic problem. The current episode started more than 1 year ago. The problem occurs occasionally. The symptoms  are aggravated by certain foods. She has tried a PPI for the symptoms. The treatment provided moderate relief.  Hyperlipidemia This is a chronic problem. The current episode started more than 1 year ago. The problem is controlled. Recent lipid tests were reviewed and are normal. Exacerbating diseases include obesity. Pertinent negatives include no shortness of breath. Current antihyperlipidemic treatment includes statins. The current treatment provides moderate improvement of lipids. Risk factors for coronary artery disease include dyslipidemia, diabetes mellitus, hypertension, a sedentary lifestyle and post-menopausal.  Vaginal Itching The patient's primary symptoms include genital itching. The patient's pertinent negatives include no genital odor, vaginal bleeding or vaginal discharge.    Problems:  Patient Active Problem List   Diagnosis Date Noted   Osteoporosis 12/16/2021   History of esophageal cancer 08/05/2021   Esophageal dysphagia 11/21/2018   Esophageal stenosis 11/21/2018   Esophageal stricture    CAD in native artery 06/11/2017   Abnormal nuclear cardiac imaging test 06/11/2017   Angina pectoris (HCC)    OSA on CPAP 12/19/2016   Diabetes mellitus (HCC) 11/21/2016   GERD with esophagitis 11/21/2016   History of esophageal stricture 11/21/2016   Hypertension associated with diabetes (HCC) 11/21/2016   Hyperlipidemia associated with type 2 diabetes mellitus (HCC) 11/21/2016   COPD (chronic obstructive pulmonary disease) (HCC) 11/21/2016   Early cataracts, bilateral 11/21/2016    Allergies: No Known Allergies Medications:  Current Outpatient Medications:    fluconazole (DIFLUCAN) 150 MG tablet, Take 1 tablet (150 mg total) by mouth every three (3) days as needed., Disp: 3 tablet, Rfl: 0   albuterol (PROVENTIL) (2.5 MG/3ML) 0.083% nebulizer solution, Take 3 mLs (2.5 mg total) by nebulization every 6 (six) hours as needed for wheezing or shortness of breath., Disp: 75 mL, Rfl:  3   aspirin EC 81 MG tablet, Take 81 mg by mouth in the morning., Disp: , Rfl:    Blood Glucose Monitor Software DEVI, Test BS BID and PRN Dx E11.9, Disp: 1 Device, Rfl: 1   calcium carbonate (OSCAL) 1500 (600 Ca) MG TABS tablet, Take 600 mg by mouth in the morning and at bedtime., Disp: , Rfl:    Cholecalciferol (VITAMIN D-3 PO), Take 5,000 Units by mouth in the morning., Disp: , Rfl:    empagliflozin (JARDIANCE) 10 MG TABS tablet, Take 1 tablet (10 mg total) by mouth daily before breakfast., Disp: 30 tablet, Rfl: 0   famotidine (PEPCID) 40 MG tablet, Take 1 tablet (40 mg total) by mouth at bedtime., Disp: 90 tablet, Rfl: 3   fluticasone-salmeterol (ADVAIR) 250-50 MCG/ACT AEPB, Inhale 1 puff into the lungs every 12 (twelve) hours., Disp: 60 each, Rfl: 11   glimepiride (AMARYL) 2 MG tablet, Take 1 tablet (2 mg total) by mouth daily as needed (blood sugar more than 200.)., Disp: 90 tablet, Rfl: 1   glucose blood test strip, test BS BID Dx E11.9, Disp: 200 each, Rfl: 3   isosorbide mononitrate (IMDUR) 30 MG 24 hr tablet, Take 1 tablet (30 mg total) by mouth daily., Disp: 45 tablet, Rfl: 3   metFORMIN (GLUCOPHAGE) 1000 MG tablet, Take 1 tablet (1,000 mg total) by mouth 2 (two) times daily with a meal., Disp: 200 tablet, Rfl: 0   metoprolol succinate (TOPROL XL) 25 MG 24 hr tablet, Take 1 tablet (25 mg  total) by mouth daily., Disp: 90 tablet, Rfl: 3   Multiple Vitamins-Minerals (ADULT ONE DAILY GUMMIES PO), Take 2 tablets by mouth in the morning. Women's Multi by [employer], Disp: , Rfl:    nitroGLYCERIN (NITROSTAT) 0.4 MG SL tablet, Place 1 tablet (0.4 mg total) under the tongue every 5 (five) minutes as needed for chest pain., Disp: 25 tablet, Rfl: 3   omeprazole (PRILOSEC) 40 MG capsule, Take 1 capsule (40 mg total) by mouth daily., Disp: 180 capsule, Rfl: 0   rosuvastatin (CRESTOR) 10 MG tablet, Take 1 tablet by mouth once daily, Disp: 90 tablet, Rfl: 3  Observations/Objective: Patient is  well-developed, well-nourished in no acute distress.  Resting comfortably  at home.  Head is normocephalic, atraumatic.  No labored breathing.  Speech is clear and coherent with logical content.  Patient is alert and oriented at baseline.    Assessment and Plan: 1. Type 2 diabetes mellitus with other specified complication, without long-term current use of insulin (HCC) (Primary) - Bayer DCA Hb A1c Waived; Future - CBC with Differential/Platelet; Future - CMP14+EGFR; Future - empagliflozin (JARDIANCE) 10 MG TABS tablet; Take 1 tablet (10 mg total) by mouth daily before breakfast.  Dispense: 30 tablet; Refill: 0 - metFORMIN (GLUCOPHAGE) 1000 MG tablet; Take 1 tablet (1,000 mg total) by mouth 2 (two) times daily with a meal.  Dispense: 200 tablet; Refill: 0 - glimepiride (AMARYL) 2 MG tablet; Take 1 tablet (2 mg total) by mouth daily as needed (blood sugar more than 200.).  Dispense: 90 tablet; Refill: 1 - Microalbumin / creatinine urine ratio; Future  2. Gastroesophageal reflux disease with esophagitis without hemorrhage - CBC with Differential/Platelet; Future - CMP14+EGFR; Future - famotidine (PEPCID) 40 MG tablet; Take 1 tablet (40 mg total) by mouth at bedtime.  Dispense: 90 tablet; Refill: 3 - omeprazole (PRILOSEC) 40 MG capsule; Take 1 capsule (40 mg total) by mouth daily.  Dispense: 180 capsule; Refill: 0  3. Hypertension associated with diabetes (HCC) - CBC with Differential/Platelet; Future - CMP14+EGFR; Future  4. Hyperlipidemia associated with type 2 diabetes mellitus (HCC) - CBC with Differential/Platelet; Future - CMP14+EGFR; Future  5. Panlobular emphysema (HCC) - CBC with Differential/Platelet; Future - CMP14+EGFR; Future  6. OSA on CPAP - CBC with Differential/Platelet; Future - CMP14+EGFR; Future  7. Type 2 diabetes mellitus without complication, without long-term current use of insulin (HCC) - metFORMIN (GLUCOPHAGE) 1000 MG tablet; Take 1 tablet (1,000 mg  total) by mouth 2 (two) times daily with a meal.  Dispense: 200 tablet; Refill: 0  8. Vagina, candidiasis - fluconazole (DIFLUCAN) 150 MG tablet; Take 1 tablet (150 mg total) by mouth every three (3) days as needed.  Dispense: 3 tablet; Refill: 0  Labs pending  Continue current medications  Healthy diet and exercise  RTO in 3 months   Follow Up Instructions: I discussed the assessment and treatment plan with the patient. The patient was provided an opportunity to ask questions and all were answered. The patient agreed with the plan and demonstrated an understanding of the instructions.  A copy of instructions were sent to the patient via MyChart unless otherwise noted below.     The patient was advised to call back or seek an in-person evaluation if the symptoms worsen or if the condition fails to improve as anticipated.    Erica Rodney, [name]

## 2023-01-03 NOTE — Progress Notes (Signed)
 CARDIOLOGY CONSULT NOTE       Patient ID: Erica Braun MRN: [id number] DOB/AGE: 14-Aug-1950 72 y.o.  Referring Physician: [name] Primary Physician: [name], [name] Primary Cardiologist: [name]   HPI:  72 y.o. referred by Dr [name] for chest pain and dyspnea. First seen on 07/10/21  Sees pulmonary for asthma/COPD Quit smoking 12 years ago Uses CPAP For OSA  Has history of stage one esophageal cancer followed at [facility] Last EGD 01/2021 with no cancer and dilatation performed She had some chest symptoms on diagnosis of this She is diabetic with HTN and HLD.    Previously seen by Dr [name] Cath by Dr [name] 06/12/17 with CTO RCA with left to right collaterals and 60% D1 stenosis EF 55% Medical Rx recommended She was prescribed imdur  but for some reason this was stopped on her last pulmonary visit Prior to cath had an abnormal myovue with moderate mixed defect in inferior wall EF 52%   She has been having exertional dyspnea but denies chest pain Dyspnea better after pollen season and when not in humidity like when she saw her brother in [place] who owns a vinyard Has OSA and uses CPAP  Lipitor giving her bad leg pains Changed to crestor  July 2023 improved and LDL at goal 69 12/10/21   Still with some dysphagia from esophageal cancer   She is being w/u for abnormal right breast mass Had right breast lumpectomy with Dr [name] 01/15/22  Carotids 07/15/22 with 50-69% right ICA ? Left subclavian steal.   Has new lung nodules on CT suspicious for cancer repeat done 12/26/22 initially delayed reading finally read yesterday 01/08/23 Lung-RADS 4B suspicious LLL 9.5 mm nodule.   I discussed this with her. I spoke with Dr [name] personally to arrange f/u I think nodule is large enough for PET scanning  She had a nice trip to [place] for nephews wedding.   She has 40 mmHg lower BP in left arm with no symptoms Discussed fu duplex scanning   ROS All other systems reviewed and negative except as  noted above  Past Medical History:  Diagnosis Date   Complication of anesthesia    COPD (chronic obstructive pulmonary disease) (HCC)    Coronary artery disease    Diabetes mellitus without complication (HCC)    Diverticulosis    Early cataracts, bilateral 11/21/2016   Emphysema of lung (HCC)    Esophageal cancer (HCC)    GERD (gastroesophageal reflux disease)    Glaucoma    patient denies   Hyperlipidemia    Hypertension    Internal hemorrhoids    Osteoporosis 12/16/2021   Pneumonia    PONV (postoperative nausea and vomiting)    Sleep apnea    uses Cpap   Spasm of the cricopharyngeus muscle    Squamous cell esophageal cancer (HCC) 05/10/2018    Family History  Problem Relation Age of Onset   Breast cancer Mother        dx 73s; mets   Arthritis Mother    COPD Father 70       emphysema   Alcohol abuse Father    Diabetes Father    Breast cancer Sister        dx late 18s; mat half sister   Early death Brother        overdose   Drug abuse Brother    Alcohol abuse Paternal Aunt    COPD Paternal Aunt    Throat cancer Paternal Uncle    Liver cancer  Maternal Grandmother        or other primary; d. late 70s   Diabetes Maternal Grandmother    Alcohol abuse Maternal Grandfather    Early death Maternal Grandfather    Stroke Paternal Grandmother    Heart disease Paternal Grandfather    Esophageal cancer Neg Hx    Stomach cancer Neg Hx    Colon cancer Neg Hx     Social History   Socioeconomic History   Marital status: Married    Spouse name: [name]   Number of children: 3   Years of education: 14   Highest education level: Some college, no degree  Occupational History   Occupation: retired    Comment: radiation protection practitioner  Tobacco Use   Smoking status: Former    Current packs/day: 0.00    Average packs/day: 1.5 packs/day for 41.0 years (61.5 ttl pk-yrs)    Types: Cigarettes    Start date: 1969    Quit date: 2010    Years since quitting: 15.0   Smokeless tobacco: Never   Vaping Use   Vaping status: Never Used  Substance and Sexual Activity   Alcohol use: Yes    Comment: wine occasionaly   Drug use: No   Sexual activity: Yes    Birth control/protection: Post-menopausal  Other Topics Concern   Not on file  Social History Narrative   Recent move from [place]    Likes to walk   Married to [name] dog at home   Social Drivers of Health   Financial Resource Strain: Low Risk  (12/09/2021)   Overall Financial Resource Strain (CARDIA)    Difficulty of Paying Living Expenses: Not hard at all  Food Insecurity: No Food Insecurity (12/09/2021)   Hunger Vital Sign    Worried About Running Out of Food in the Last Year: Never true    Ran Out of Food in the Last Year: Never true  Transportation Needs: No Transportation Needs (12/09/2021)   PRAPARE - Administrator, Civil Service (Medical): No    Lack of Transportation (Non-Medical): No  Physical Activity: Insufficiently Active (12/09/2021)   Exercise Vital Sign    Days of Exercise per Week: 3 days    Minutes of Exercise per Session: 30 min  Stress: No Stress Concern Present (12/09/2021)   [employer] of Occupational Health - Occupational Stress Questionnaire    Feeling of Stress : Only a little  Social Connections: Socially Integrated (12/09/2021)   Social Connection and Isolation Panel [NHANES]    Frequency of Communication with Friends and Family: More than three times a week    Frequency of Social Gatherings with Friends and Family: More than three times a week    Attends Religious Services: More than 4 times per year    Active Member of Clubs or Organizations: Yes    Attends Banker Meetings: More than 4 times per year    Marital Status: Married  Catering Manager Violence: Not At Risk (12/09/2021)   Humiliation, Afraid, Rape, and Kick questionnaire    Fear of Current or Ex-Partner: No    Emotionally Abused: No    Physically Abused: No    Sexually Abused: No    Past  Surgical History:  Procedure Laterality Date   ABDOMINAL HYSTERECTOMY     cervical dysplasia   BIOPSY  05/03/2018   Procedure: BIOPSY;  Surgeon: Wilhelmenia, Aloha Raddle., MD;  Location: [facility] ENDOSCOPY;  Service: Gastroenterology;;   BREAST BIOPSY Left  BREAST BIOPSY Right 11/27/2021   MM RT BREAST BX W LOC DEV 1ST LESION IMAGE BX SPEC STEREO GUIDE 11/27/2021 [facility] MAMMOGR[facility]   BREAST BIOPSY  01/14/2022   MM RT RADIOACTIVE SEED LOC MAMMO GUIDE 01/14/2022 [facility]MO[facility]   BREAST LUMPECTOMY Left    BREAST LUMPECTOMY WITH RADIOACTIVE SEED LOCALIZATION Right 01/15/2022   Procedure: RIGHT BREAST LUMPECTOMY WITH RADIOACTIVE SEED LOCALIZATION;  Surgeon: [name] Ned, MD;  Location: [facility] OR;  Service: General;  Laterality: Right;   ESOPHAGOGASTRODUODENOSCOPY (EGD) WITH PROPOFOL  N/A 05/03/2018   Procedure: ESOPHAGOGASTRODUODENOSCOPY (EGD) WITH PROPOFOL ;  Surgeon: Wilhelmenia Aloha Raddle., MD;  Location: [facility] ENDOSCOPY;  Service: Gastroenterology;  Laterality: N/A;   ESOPHAGOGASTRODUODENOSCOPY (EGD) WITH PROPOFOL  N/A 11/18/2018   Procedure: ESOPHAGOGASTRODUODENOSCOPY (EGD) WITH PROPOFOL ;  Surgeon: Abran Norleen SAILOR, MD;  Location: [facility] ENDOSCOPY;  Service: Endoscopy;  Laterality: N/A;   EUS  05/03/2018   Procedure: UPPER ENDOSCOPIC ULTRASOUND (EUS) RADIAL;  Surgeon: Wilhelmenia Aloha Raddle., MD;  Location: [facility] ENDOSCOPY;  Service: Gastroenterology;;   FOREIGN BODY REMOVAL  11/18/2018   Procedure: FOREIGN BODY REMOVAL;  Surgeon: Abran Norleen SAILOR, MD;  Location: [facility] ENDOSCOPY;  Service: Endoscopy;;   LEFT HEART CATH AND CORONARY ANGIOGRAPHY N/A 06/11/2017   Procedure: LEFT HEART CATH AND CORONARY ANGIOGRAPHY;  Surgeon: [name] Victory ORN, MD;  Location: [facility] INVASIVE CV LAB;  Service: Cardiovascular;  Laterality: N/A;      Current Outpatient Medications:    albuterol  (PROVENTIL ) (2.5 MG/3ML) 0.083% nebulizer solution, Take 3 mLs (2.5 mg total) by nebulization every 6 (six) hours as needed for wheezing or shortness of breath.,  Disp: 75 mL, Rfl: 3   aspirin  EC 81 MG tablet, Take 81 mg by mouth in the morning., Disp: , Rfl:    atenolol  (TENORMIN ) 25 MG tablet, Take 1 tablet (25 mg total) by mouth 2 (two) times daily., Disp: 180 tablet, Rfl: 3   Blood Glucose Monitor Software DEVI, Test BS BID and PRN Dx E11.9, Disp: 1 Device, Rfl: 1   calcium  carbonate (OSCAL) 1500 (600 Ca) MG TABS tablet, Take 600 mg by mouth in the morning and at bedtime., Disp: , Rfl:    Cholecalciferol (VITAMIN D -3 PO), Take 5,000 Units by mouth in the morning., Disp: , Rfl:    empagliflozin  (JARDIANCE ) 10 MG TABS tablet, Take 1 tablet (10 mg total) by mouth daily before breakfast., Disp: 30 tablet, Rfl: 0   famotidine  (PEPCID ) 40 MG tablet, Take 1 tablet (40 mg total) by mouth at bedtime., Disp: 90 tablet, Rfl: 3   fluconazole  (DIFLUCAN ) 150 MG tablet, Take 1 tablet (150 mg total) by mouth every three (3) days as needed., Disp: 3 tablet, Rfl: 0   fluticasone -salmeterol (ADVAIR) 250-50 MCG/ACT AEPB, Inhale 1 puff into the lungs every 12 (twelve) hours., Disp: 60 each, Rfl: 11   glimepiride  (AMARYL ) 2 MG tablet, Take 1 tablet (2 mg total) by mouth daily as needed (blood sugar more than 200.)., Disp: 90 tablet, Rfl: 1   glucose blood test strip, test BS BID Dx E11.9, Disp: 200 each, Rfl: 3   isosorbide  mononitrate (IMDUR ) 30 MG 24 hr tablet, Take 1 tablet (30 mg total) by mouth daily., Disp: 45 tablet, Rfl: 3   Magnesium Bisglycinate (MAG GLYCINATE PO), Take by mouth in the morning and at bedtime., Disp: , Rfl:    metFORMIN  (GLUCOPHAGE ) 1000 MG tablet, Take 1 tablet (1,000 mg total) by mouth 2 (two) times daily with a meal., Disp: 200 tablet, Rfl: 0   Multiple Vitamins-Minerals (ADULT ONE DAILY GUMMIES  PO), Take 2 tablets by mouth in the morning. Women's Multi by [employer], Disp: , Rfl:    nitroGLYCERIN  (NITROSTAT ) 0.4 MG SL tablet, Place 1 tablet (0.4 mg total) under the tongue every 5 (five) minutes as needed for chest pain., Disp: 25 tablet, Rfl: 3    omeprazole  (PRILOSEC) 40 MG capsule, Take 1 capsule (40 mg total) by mouth daily., Disp: 180 capsule, Rfl: 0   rosuvastatin  (CRESTOR ) 10 MG tablet, Take 1 tablet by mouth once daily, Disp: 90 tablet, Rfl: 3    Physical Exam: Blood pressure 132/80, pulse 97, height 5' 1 (1.549 m), weight 136 lb 11.2 oz (62 kg), SpO2 98%.  BP 40 mm systolic higher in right arm  Affect appropriate Healthy:  appears stated age HEENT: normal Neck supple with no adenopathy JVP normal left subclavian  bruits no thyromegaly Lungs clear with no wheezing and good diaphragmatic motion Heart:  S1/S2 no murmur, no rub, gallop or click PMI normal Abdomen: benighn, BS positve, no tenderness, no AAA no bruit.  No HSM or HJR Distal pulses intact with no bruits No edema Neuro non-focal Skin warm and dry No muscular weakness   Labs:   Lab Results  Component Value Date   WBC 3.7 04/14/2022   HGB 11.3 04/14/2022   HCT 36.6 04/14/2022   MCV 85 04/14/2022   PLT 141 (L) 04/14/2022   No results for input(s): NA, K, CL, CO2, BUN, CREATININE, CALCIUM , PROT, BILITOT, ALKPHOS, ALT, AST, GLUCOSE in the last 168 hours.  Invalid input(s): LABALBU No results found for: CKTOTAL, CKMB, CKMBINDEX, TROPONINI  Lab Results  Component Value Date   CHOL 147 12/10/2021   CHOL 126 09/10/2021   CHOL 203 (H) 10/25/2020   Lab Results  Component Value Date   HDL 64 12/10/2021   HDL 54 09/10/2021   HDL 52 10/25/2020   Lab Results  Component Value Date   LDLCALC 69 12/10/2021   LDLCALC 49 09/10/2021   LDLCALC 124 (H) 10/25/2020   Lab Results  Component Value Date   TRIG 72 12/10/2021   TRIG 135 09/10/2021   TRIG 154 (H) 10/25/2020   Lab Results  Component Value Date   CHOLHDL 2.3 12/10/2021   CHOLHDL 2.3 09/10/2021   CHOLHDL 3.9 10/25/2020   No results found for: LDLDIRECT    Radiology: CT CHEST LCS NODULE F/U LOW DOSE WO CONTRAST Result Date: 01/08/2023 CLINICAL DATA:   Lung cancer screening. Follow-up lung nodules. Sixty-one pack-year smoking history. EXAM: CT CHEST WITHOUT CONTRAST FOR LUNG CANCER SCREENING NODULE FOLLOW-UP TECHNIQUE: Multidetector CT imaging of the chest was performed following the standard protocol without IV contrast. RADIATION DOSE REDUCTION: This exam was performed according to the departmental dose-optimization program which includes automated exposure control, adjustment of the mA and/or kV according to patient size and/or use of iterative reconstruction technique. COMPARISON:  09/16/2022 FINDINGS: Cardiovascular: Heart size is normal. No pericardial effusion. Aortic atherosclerosis and coronary artery calcifications. Mediastinum/Nodes: Left lobe of thyroid  gland is surgically absent. Trachea appears patent and is midline. Normal appearance of the esophagus. No enlarged mediastinal or hilar lymph nodes. Lungs/Pleura: Emphysema with diffuse bronchial wall thickening. No pleural effusion or airspace consolidation. The posteromedial left lower lobe lung nodule has increased in size from previous exam. This now measures 9.5 mm, image 222/4. Previously 6.3 mm. Nodule within the subpleural right middle lobe is stable in the interval measuring 6.5 mm on today's study. Previously this measured 7.0 mm. No new lung nodules. Upper Abdomen: No acute abnormality.  Calcified granulomas noted within the spleen. Upper pole right kidney calcification measures 4 mm. Musculoskeletal: No chest wall mass or suspicious bone lesions identified. Spondylosis noted within the thoracic spine. IMPRESSION: 1. Lung-RADS 4B, suspicious. Additional imaging evaluation or consultation with Pulmonology or Thoracic Surgery recommended. Left lower lobe, 9.5 mm, image 222/4. 2. Coronary artery calcifications 3. Aortic Atherosclerosis (ICD10-I70.0) and Emphysema (ICD10-J43.9). These results will be called to the ordering clinician or representative by the Radiologist Assistant, and communication  documented in the PACS or [employer]. Electronically Signed   By: Waddell Calk M.D.   On: 01/08/2023 10:30    EKG: ST rate 133 no acute ST changes 11/19/18  01/09/2023 SR normal    ASSESSMENT AND PLAN:   CAD:  cath 2019 with CTO RCA left to right collaterals and 60% D1 stenosis Currently on ASA/Statin No myalgias on crestor  Beta blocker and nitrates added back June 2024  COPD:  f/u Sood abnormal CT chest 09/16/22 with new bilateral 7 mm lung nodules repeat done 12/26/22 finally read 01/08/23 with 9.5 mm LLL nodule Spoke with Dr [name] personally to arrange f/u and PET scanning if appropriate  Esophageal Cancer:  F/U [facility] on prilosec and pepcid  Last EGD 2021 should have f/u with Dr Albertus ? [facility] HTN:  continue losartan  given DM Change lopressor  to atenolol  25 mg due to side effects ( bad gas with lopressor  )  HLD:  LDL 69 at goal continue statin  DM:  Discussed low carb diet.  Target hemoglobin A1c is 6.5 or less.  Continue current medications.  A1c 7.0 10/25/20  Breast Cancer:  post right lumpectomy pathology negative for cancer  Blurred Vision: with funny moving sensation She has had vertigo in past but this was slightly different ECG with no afib and she indicates pulse / BP fine during episode Carotids only 50-69% right ICA stenosis  Left subclavian:  bruit with 40 mmHg difference in systolic BP update carotid duplex No symptoms    Carotid duplex   F/U pulmonary Camie Lites lung nodules prior smoker  Needs PET/Scan ? biopsy  F/U in 3 months   Signed: Maude Emmer 01/09/2023, 1:48 PM

## 2023-01-08 ENCOUNTER — Telehealth: Payer: Self-pay | Admitting: Acute Care

## 2023-01-08 NOTE — Telephone Encounter (Signed)
Call Report  

## 2023-01-08 NOTE — Telephone Encounter (Signed)
 IMPRESSION: 1. Lung-RADS 4B, suspicious. Additional imaging evaluation or consultation with Pulmonology or Thoracic Surgery recommended. Left lower lobe, 9.5 mm, image 222/4. 2. Coronary artery calcifications 3. Aortic Atherosclerosis (ICD10-I70.0) and Emphysema (ICD10-J43.9).

## 2023-01-09 ENCOUNTER — Encounter: Payer: Self-pay | Admitting: Cardiovascular Disease

## 2023-01-09 ENCOUNTER — Telehealth: Payer: Self-pay | Admitting: Pulmonary Disease

## 2023-01-09 ENCOUNTER — Ambulatory Visit: Payer: PPO | Attending: Cardiovascular Disease | Admitting: Cardiovascular Disease

## 2023-01-09 VITALS — BP 132/80 | HR 97 | Ht 61.0 in | Wt 136.7 lb

## 2023-01-09 DIAGNOSIS — I10 Essential (primary) hypertension: Secondary | ICD-10-CM | POA: Diagnosis not present

## 2023-01-09 DIAGNOSIS — R918 Other nonspecific abnormal finding of lung field: Secondary | ICD-10-CM

## 2023-01-09 DIAGNOSIS — G458 Other transient cerebral ischemic attacks and related syndromes: Secondary | ICD-10-CM | POA: Diagnosis not present

## 2023-01-09 DIAGNOSIS — E1169 Type 2 diabetes mellitus with other specified complication: Secondary | ICD-10-CM

## 2023-01-09 DIAGNOSIS — R911 Solitary pulmonary nodule: Secondary | ICD-10-CM

## 2023-01-09 DIAGNOSIS — E785 Hyperlipidemia, unspecified: Secondary | ICD-10-CM

## 2023-01-09 MED ORDER — ATENOLOL 25 MG PO TABS: 25.0000 mg | ORAL_TABLET | Freq: Two times a day (BID) | ORAL | 3 refills | Status: DC

## 2023-01-09 NOTE — Patient Instructions (Signed)
 Medication Instructions:   Stop Taking Toprol  XL  Start Taking Atenolol  25 mg Two Times Daily   *If you need a refill on your cardiac medications before your next appointment, please call your pharmacy*   Lab Work: NONE   If you have labs (blood work) drawn today and your tests are completely normal, you will receive your results only by: MyChart Message (if you have MyChart) OR A paper copy in the mail If you have any lab test that is abnormal or we need to change your treatment, we will call you to review the results.   Testing/Procedures: Your physician has requested that you have a carotid duplex. This test is an ultrasound of the carotid arteries in your neck. It looks at blood flow through these arteries that supply the brain with blood. Allow one hour for this exam. There are no restrictions or special instructions.    Follow-Up: At [facility], you and your health needs are our priority.  As part of our continuing mission to provide you with exceptional heart care, we have created designated Provider Care Teams.  These Care Teams include your primary Cardiologist (physician) and Advanced Practice Providers (APPs -  Physician Assistants and Nurse Practitioners) who all work together to provide you with the care you need, when you need it.  We recommend signing up for the patient portal called MyChart.  Sign up information is provided on this After Visit Summary.  MyChart is used to connect with patients for Virtual Visits (Telemedicine).  Patients are able to view lab/test results, encounter notes, upcoming appointments, etc.  Non-urgent messages can be sent to your provider as well.   To learn more about what you can do with MyChart, go to [email or web address].    Your next appointment:   3 month(s)  Provider:   You may see Maude Emmer, MD or one of the following Advanced Practice Providers on your designated Care Team:   Laymon Qua, PA-C  Olivia Pavy,  PA-C     Other Instructions Thank you for choosing [facility]!

## 2023-01-09 NOTE — Telephone Encounter (Signed)
 I called and spoke with the pt. I informed the pt of the PET scan which was ordered today by Dr [name], and she will receive a call to have it scheduled. I informed the pt that a f/u will need to be scheduled 2-3 weeks after her PET scan so the results can be discussed with her. Pt was a Dr [name] pt. Pt needs a f/u with Dr Brenna, Dr Shelah, or Lauraine, NP. Pt verbalized understanding. Routing to the front desk. Nothing further needed once f/u is scheduled.

## 2023-01-09 NOTE — Telephone Encounter (Signed)
 Spoke with Dr. [name] of cardiology  He had spoken to the patient about CT scan findings and was reaching out to pulmonary to ensure the patient is followed up promptly  I will go ahead and order a PET scan as nodule size is greater than 8 mm  Need to schedule follow-up appointment with either Dr. Brenna or Byrum

## 2023-01-15 ENCOUNTER — Encounter (HOSPITAL_COMMUNITY): Payer: PPO

## 2023-01-16 ENCOUNTER — Encounter (HOSPITAL_COMMUNITY)
Admission: RE | Admit: 2023-01-16 | Discharge: 2023-01-16 | Disposition: A | Discharge status: Home / Self Care | Payer: PPO | Source: Ambulatory Visit | Attending: Pulmonary Disease

## 2023-01-16 DIAGNOSIS — C801 Malignant (primary) neoplasm, unspecified: Secondary | ICD-10-CM | POA: Diagnosis not present

## 2023-01-16 DIAGNOSIS — R911 Solitary pulmonary nodule: Secondary | ICD-10-CM | POA: Insufficient documentation

## 2023-01-16 DIAGNOSIS — C159 Malignant neoplasm of esophagus, unspecified: Secondary | ICD-10-CM | POA: Diagnosis not present

## 2023-01-16 DIAGNOSIS — I2510 Atherosclerotic heart disease of native coronary artery without angina pectoris: Secondary | ICD-10-CM | POA: Diagnosis not present

## 2023-01-16 DIAGNOSIS — I6523 Occlusion and stenosis of bilateral carotid arteries: Secondary | ICD-10-CM | POA: Diagnosis not present

## 2023-01-16 LAB — GLUCOSE, CAPILLARY: Glucose-Capillary: 124 mg/dL — ABNORMAL HIGH (ref 70–99)

## 2023-01-16 MED ORDER — FLUDEOXYGLUCOSE F - 18 (FDG) INJECTION
6.5000 | Freq: Once | INTRAVENOUS | Status: AC
  Administered 2023-01-16: 6.77 via INTRAVENOUS

## 2023-01-26 ENCOUNTER — Ambulatory Visit: Payer: PPO | Attending: Cardiovascular Disease

## 2023-01-26 DIAGNOSIS — G458 Other transient cerebral ischemic attacks and related syndromes: Secondary | ICD-10-CM

## 2023-01-28 ENCOUNTER — Encounter: Payer: Self-pay | Admitting: Acute Care

## 2023-01-28 ENCOUNTER — Ambulatory Visit: Payer: PPO | Admitting: Acute Care

## 2023-01-28 VITALS — BP 130/56 | HR 102 | Ht 61.0 in | Wt 137.2 lb

## 2023-01-28 DIAGNOSIS — C159 Malignant neoplasm of esophagus, unspecified: Secondary | ICD-10-CM

## 2023-01-28 DIAGNOSIS — G4733 Obstructive sleep apnea (adult) (pediatric): Secondary | ICD-10-CM | POA: Diagnosis not present

## 2023-01-28 DIAGNOSIS — R918 Other nonspecific abnormal finding of lung field: Secondary | ICD-10-CM

## 2023-01-28 DIAGNOSIS — Z87891 Personal history of nicotine dependence: Secondary | ICD-10-CM | POA: Diagnosis not present

## 2023-01-28 NOTE — Patient Instructions (Addendum)
It is good to see you today. The PET scan showed low level metabolic of uptake in the pulmonary nodules we are watching. Plan will be for a 101-month follow-up CT chest without contrast which we do in April 2025, to reevaluate these nodules. You will come back to the office to follow-up with me within a week of the CT scan being done so that we can review the results. We will increase the pressure settings of your CPAP machine back to 11 cm of water. You will need to follow-up in the office in 6 months with Dr.Olalere, who will be your new sleep doctor and pulmonary , doctor to review the results of the download, and discuss your inhaler therapy.. I have sent a referral and a message to Dr. Rhea Belton with GI to get you into the office to reevaluate the increased activity on the PET scan noted in your esophagus. This may just be related to reflux or upper airway irritation but with your history of stage I esophageal cancer it is time for you to follow-up with him to ensure you remain healthy. Follow-up with Riham Polyakov NP  or Dr. Delton Coombes after repeat CT chest in 3 months.( April 2025) Call for any hemoptysis, unintentional weight loss, or breathing issues so we can see you sooner. Please contact office for sooner follow up if symptoms do not improve or worsen or seek emergency care

## 2023-01-28 NOTE — Progress Notes (Signed)
History of Present Illness Erica Braun is a 73 y.o. female former smoker with a 61 pack year smoking history, Quit 2010. She is followed through the lung cancer screening program. She is here for consultation regarding a growing left lower lobe lung mass.She also has a history of asthma and OSA on CPAP, followed by Dr. Craige Cotta.  She will be followed by Dr. 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 Mosetta Putt with oncology and Dr. Erick Blinks with GI - Had surgery and treatment at [facility]   01/28/2023 Pt. Presents for consultation regarding a left lower lobe lung mass.  Patient has been followed through the lung cancer screening program.  Initial chest imaging done 09/26/2022 showed a 7 mm irregular right upper lobe lesion that was new since her previous study.  The scan was read as a 4A and a 64-month follow-up was ordered. Follow-up imaging done 12/25/2022 showed the left lower lobe lung nodule of concern had increased in size from 6.3 mm to 9.5 mm.  The nodule within the right middle lobe is stable at the interval previously 6.3 mm and on the study in December 6 0.5 mm.  This had previously measured up to 7 mm.  This scan was read as a lung RADS 4B.  We called and discussed the results with the patient over the phone and recommended pet imaging and follow-up in the office to further evaluate the left lower lobe mass. Pet imaging was done 01/16/2023.  The left lower lobe nodule of concern measures 6 mm in diameter and has an SUV of 2.0.  Low-grade activity is nonspecific for malignancy but could be from benign causes or low-grade adenocarcinoma.  Recommendation per radiology is for chest CT surveillance for morphologic changes. Additionally the clustered right upper lobe nodularity measuring 0.8 cm x 0.6 cm had an SUV max of 2.5.  Also borderline hypermetabolic which could be from an atypical infection versus malignancy.   Again they recommend CT surveillance for morphologic changes. Pt. Is in agreement with a 3 month follow up. I have told her to call to be seen sooner for hemoptysis, unintentional weight loss, or worsening dyspnea.  Patient states she has been doing well.  She denies any weight loss, hemoptysis, or worsening shortness of breath than her baseline.  We have discussed the results of her PET scan and she is comfortable with a 43-month follow-up CT for continued surveillance of both of these areas.  She understands that this could be a slowly growing adenocarcinoma which will eventually need to be biopsied if it continues to show growth.  Pt. Is also on CPAP Her pressure is set to 9 mm Hg. She saw Dr. Craige Cotta 11/2021 and he decreased her pressures to 9 mm Hg as she had dry mouth. She is here today stating she is now waking up with headaches, and she would like the pressures increased back to her previous settings of 11 cm H2O. Down Load does show well controlled AHI of 0.2 , but the pressure needs increase for comfort. I have counseled her on using a mouth moisturizing mouth wash in the morning and at bedtime to help with dry mouth she experienced on the higher pressures.   Follow up with me to review CT Chest results in 3 months Follow up with Dr. Wynona Neat in 6 months to establish care as her new pulmonologist and sleep MD. ( Previous patient  of Dr. Craige Cotta.    Test Results: PET 01/16/2023 NECK: No significant abnormal hypermetabolic activity in this region.   Incidental CT findings: Absent left thyroid lobe. Mild bilateral common carotid atheromatous vascular calcifications.   CHEST: The left lower lobe nodule of concern is somewhat blurred by motion artifact but measures about 6 mm in diameter on image 48 series 7. Associated maximum SUV 2.0.   Clustered right upper lobe nodularity with the cluster measuring about 0.8 by 0.6 cm on image 16 series 7, maximum SUV 2.5.   Accentuated distal esophageal  activity, maximum SUV 3.4, nonspecific for physiologic activity versus malignancy.   Incidental CT findings: Coronary, aortic arch, and branch vessel atherosclerotic vascular disease. Emphysema. Old granulomatous disease. Mild lingular scarring.   ABDOMEN/PELVIS: Physiologic activity in bowel. Accentuated anal activity, maximum SUV 5.5, physiologic activity versus malignancy. No obvious associated abnormality on the CT data.   Incidental CT findings: Renal calculus versus physiologic parenchymal calcification in the right mid kidney, image 97 series 4. Atrophic pancreas. Old granulomatous disease involving the spleen. Atherosclerosis is present, including aortoiliac atherosclerotic disease. Uterus absent. Sigmoid colon diverticulosis. Scattered diverticulosis elsewhere in the colon including the cecum.   SKELETON: No significant abnormal hypermetabolic activity in this region.   Incidental CT findings: Grade 1 degenerative anterolisthesis at L3-4, L4-5, and L5-S1. Midthoracic spondylosis.   IMPRESSION: 1. The left lower lobe nodule of concern is somewhat blurred by motion artifact but measures about 6 mm in diameter with maximum SUV 2.0. This low-grade activity is nonspecific for malignancy and could be from benign causes or low-grade adenocarcinoma. Chest CT surveillance for morphologic changes would be suggested. 2. Clustered right upper lobe nodularity with the cluster measuring about 0.8 by 0.6 cm, maximum SUV 2.5. This is borderline hypermetabolic and could be from atypical infection or malignancy. CT surveillance of this cluster of nodularity is likewise recommended. 3. Accentuated distal esophageal activity, maximum SUV 3.4, nonspecific for physiologic activity versus malignancy. 4. Accentuated anal activity, maximum SUV 5.5, physiologic activity versus malignancy. No obvious associated abnormality on the CT data. Correlate with history gastrointestinal screening, if  not up-to-date and further assessment such as digital rectal exam and/or colon screening may be warranted. 5. Coronary, aortic arch, and branch vessel atherosclerotic vascular disease. 6. Emphysema. 7. Old granulomatous disease. 8. Grade 1 degenerative anterolisthesis at L3-4, L4-5, and L5-S1.    Spirometry 03/02/15 >> FEV1 1.23 (57%), FEV1% 50 PFT 12/11/17 >> FEV1 0.98 (47%), FEV1% 60, TLC 108%, DLCO 59% IgE 12/11/17 >> 91 A1AT 12/13/17 >> 179, MM PSG 11/15/17 >> AHI 11.1, SpO2 low 74%   LHC 06/11/17 >> EF 55%, nl LVEDP, patent Lt main and circumflex stent, chronic occlusion of mid RCA      Latest Ref Rng & Units 04/14/2022   11:51 AM 01/10/2022    1:00 PM 12/10/2021   10:45 AM  CBC  WBC 3.4 - 10.8 x10E3/uL 3.7  4.0  3.4   Hemoglobin 11.1 - 15.9 g/dL 16.1  09.6  04.5   Hematocrit 34.0 - 46.6 % 36.6  35.7  34.7   Platelets 150 - 450 x10E3/uL 141  124  149        Latest Ref Rng & Units 04/14/2022   11:51 AM 01/10/2022    1:00 PM 12/10/2021   10:45 AM  BMP  Glucose 70 - 99 mg/dL 409  811  914   BUN 8 - 27 mg/dL 13  18  13    Creatinine 0.57 - 1.00 mg/dL 7.82  0.91  0.76   BUN/Creat Ratio 12 - 28 16   17    Sodium 134 - 144 mmol/L 143  137  139   Potassium 3.5 - 5.2 mmol/L 4.2  3.8  4.1   Chloride 96 - 106 mmol/L 103  103  102   CO2 20 - 29 mmol/L 22  25  22    Calcium 8.7 - 10.3 mg/dL 9.4  9.2  9.3     BNP No results found for: "BNP"  ProBNP No results found for: "PROBNP"  PFT    Component Value Date/Time   FEV1PRE 0.88 12/11/2017 1149   FEV1POST 0.98 12/11/2017 1149   FVCPRE 1.50 12/11/2017 1149   FVCPOST 1.64 12/11/2017 1149   TLC 5.01 12/11/2017 1149   DLCOUNC 11.95 12/11/2017 1149   PREFEV1FVCRT 58 12/11/2017 1149   PSTFEV1FVCRT 60 12/11/2017 1149    VAS US CAROTID Result Date: 01/26/2023 Carotid Arterial Duplex Study Patient Name:  MELAYSIA AGOGLIA [facility]  Date of Exam:   01/26/2023 Medical Rec #: [id number]                 Accession #:    [id number] Date of  Birth: December 13, 1950                 Patient Gender: F Patient Age:   22 years Exam Location:  [place] Procedure:      VAS US CAROTID Referring Phys: Charlton Haws --------------------------------------------------------------------------------  Indications:       Subclavian steal syndrome. Risk Factors:      Hypertension, hyperlipidemia, Diabetes, past history of                    smoking, coronary artery disease. Other Factors:     TIA.                    Dizziness.                    OSA.                    COPD. Comparison Study:  Prior study 07/15/2022 Right 1-39% ICA stenosis. Left 50-69%                    ICA stenosis Performing Technologist: Dominica Severin RCS, RVS  Examination Guidelines: A complete evaluation includes B-mode imaging, spectral Doppler, color Doppler, and power Doppler as needed of all accessible portions of each vessel. Bilateral testing is considered an integral part of a complete examination. Limited examinations for reoccurring indications may be performed as noted.  Right Carotid Findings: +----------+--------+--------+--------+------------------+--------+           PSV cm/sEDV cm/sStenosisPlaque DescriptionComments +----------+--------+--------+--------+------------------+--------+ CCA Prox  89      16                                         +----------+--------+--------+--------+------------------+--------+ CCA Distal74      17              homogeneous                +----------+--------+--------+--------+------------------+--------+ ICA Prox  86      27      1-39%   heterogenous      tortuous +----------+--------+--------+--------+------------------+--------+ ICA Mid   121     33                                         +----------+--------+--------+--------+------------------+--------+  ICA Distal98      26                                         +----------+--------+--------+--------+------------------+--------+ ECA       122                                                 +----------+--------+--------+--------+------------------+--------+ +----------+--------+-------+----------------+-------------------+           PSV cm/sEDV cmsDescribe        Arm Pressure (mmHG) +----------+--------+-------+----------------+-------------------+ [id number]            Multiphasic, [id number]                 +----------+--------+-------+----------------+-------------------+ +---------+--------+--+--------+---------+ VertebralPSV cm/s78EDV cm/sAntegrade +---------+--------+--+--------+---------+  Left Carotid Findings: +----------+--------+--------+--------+------------------+--------+           PSV cm/sEDV cm/sStenosisPlaque DescriptionComments +----------+--------+--------+--------+------------------+--------+ CCA Prox  97      18                                         +----------+--------+--------+--------+------------------+--------+ CCA Distal80      16              heterogenous               +----------+--------+--------+--------+------------------+--------+ ICA Prox  87      26      1-39%   heterogenous      tortuous +----------+--------+--------+--------+------------------+--------+ ICA Mid   87      22                                         +----------+--------+--------+--------+------------------+--------+ ICA Distal73      18                                         +----------+--------+--------+--------+------------------+--------+ ECA       153                                                +----------+--------+--------+--------+------------------+--------+ +----------+--------+--------+----------------+-------------------+           PSV cm/sEDV cm/sDescribe        Arm Pressure (mmHG) +----------+--------+--------+----------------+-------------------+ [id number]             Multiphasic, [id number]                 +----------+--------+--------+----------------+-------------------+  +---------+--------+--------+----------+ VertebralPSV cm/sEDV cm/sRetrograde +---------+--------+--------+----------+   Summary: Right Carotid: Velocities in the right ICA are consistent with a 1-39% stenosis.                Non-hemodynamically significant plaque <50% noted in the CCA. The                ECA appears <50% stenosed. Left Carotid: Velocities in the left ICA are consistent with a 1-39% stenosis.  Non-hemodynamically significant plaque <50% noted in the CCA. The               ECA appears <50% stenosed. Vertebrals:  Right vertebral artery demonstrates antegrade flow. Left vertebral              artery demonstrates retrograde flow. Subclavians: Normal flow hemodynamics were seen in bilateral subclavian              arteries. *See table(s) above for measurements and observations. Suggest follow up study in Compared to the study 07/15/22, left ICA stenosis of >50% not present. No Doppler evidence of significant subclavian artery stenosis. Follow up study when clinically indicated. Electronically signed by Delrae Rend on 01/26/2023 at 7:37:22 PM.    Final    NM PET Image Initial (PI) Skull Base To Thigh Result Date: 01/18/2023 CLINICAL DATA:  Initial treatment strategy for pulmonary nodule. History of squamous cell carcinoma of the esophagus. EXAM: NUCLEAR MEDICINE PET SKULL BASE TO THIGH TECHNIQUE: 6.8 mCi F-18 FDG was injected intravenously. Full-ring PET imaging was performed from the skull base to thigh after the radiotracer. CT data was obtained and used for attenuation correction and anatomic localization. Fasting blood glucose: One hundred fourteen mg/dl COMPARISON:  Chest CT 44/03/4740 and PET-CT from 05/17/2018 FINDINGS: Mediastinal blood pool activity: SUV max 2.3 Liver activity: SUV max NA NECK: No significant abnormal hypermetabolic activity in this region. Incidental CT findings: Absent left thyroid lobe. Mild bilateral common carotid atheromatous vascular calcifications.  CHEST: The left lower lobe nodule of concern is somewhat blurred by motion artifact but measures about 6 mm in diameter on image 48 series 7. Associated maximum SUV 2.0. Clustered right upper lobe nodularity with the cluster measuring about 0.8 by 0.6 cm on image 16 series 7, maximum SUV 2.5. Accentuated distal esophageal activity, maximum SUV 3.4, nonspecific for physiologic activity versus malignancy. Incidental CT findings: Coronary, aortic arch, and branch vessel atherosclerotic vascular disease. Emphysema. Old granulomatous disease. Mild lingular scarring. ABDOMEN/PELVIS: Physiologic activity in bowel. Accentuated anal activity, maximum SUV 5.5, physiologic activity versus malignancy. No obvious associated abnormality on the CT data. Incidental CT findings: Renal calculus versus physiologic parenchymal calcification in the right mid kidney, image 97 series 4. Atrophic pancreas. Old granulomatous disease involving the spleen. Atherosclerosis is present, including aortoiliac atherosclerotic disease. Uterus absent. Sigmoid colon diverticulosis. Scattered diverticulosis elsewhere in the colon including the cecum. SKELETON: No significant abnormal hypermetabolic activity in this region. Incidental CT findings: Grade 1 degenerative anterolisthesis at L3-4, L4-5, and L5-S1. Midthoracic spondylosis. IMPRESSION: 1. The left lower lobe nodule of concern is somewhat blurred by motion artifact but measures about 6 mm in diameter with maximum SUV 2.0. This low-grade activity is nonspecific for malignancy and could be from benign causes or low-grade adenocarcinoma. Chest CT surveillance for morphologic changes would be suggested. 2. Clustered right upper lobe nodularity with the cluster measuring about 0.8 by 0.6 cm, maximum SUV 2.5. This is borderline hypermetabolic and could be from atypical infection or malignancy. CT surveillance of this cluster of nodularity is likewise recommended. 3. Accentuated distal esophageal  activity, maximum SUV 3.4, nonspecific for physiologic activity versus malignancy. 4. Accentuated anal activity, maximum SUV 5.5, physiologic activity versus malignancy. No obvious associated abnormality on the CT data. Correlate with history gastrointestinal screening, if not up-to-date and further assessment such as digital rectal exam and/or colon screening may be warranted. 5. Coronary, aortic arch, and branch vessel atherosclerotic vascular disease. 6. Emphysema. 7. Old granulomatous disease. 8. Grade 1 degenerative  anterolisthesis at L3-4, L4-5, and L5-S1. Aortic Atherosclerosis (ICD10-I70.0) and Emphysema (ICD10-J43.9). Electronically Signed   By: Gaylyn Rong M.D.   On: 01/18/2023 13:14     Past medical hx Past Medical History:  Diagnosis Date   Complication of anesthesia    COPD (chronic obstructive pulmonary disease) (HCC)    Coronary artery disease    Diabetes mellitus without complication (HCC)    Diverticulosis    Early cataracts, bilateral 11/21/2016   Emphysema of lung (HCC)    Esophageal cancer (HCC)    GERD (gastroesophageal reflux disease)    Glaucoma    patient denies   Hyperlipidemia    Hypertension    Internal hemorrhoids    Osteoporosis 12/16/2021   Pneumonia    PONV (postoperative nausea and vomiting)    Sleep apnea    uses Cpap   Spasm of the cricopharyngeus muscle    Squamous cell esophageal cancer (HCC) 05/10/2018     Social History   Tobacco Use   Smoking status: Former    Current packs/day: 0.00    Average packs/day: 1.5 packs/day for 41.0 years (61.5 ttl pk-yrs)    Types: Cigarettes    Start date: 63    Quit date: 2010    Years since quitting: 15.0   Smokeless tobacco: Never  Vaping Use   Vaping status: Never Used  Substance Use Topics   Alcohol use: Yes    Comment: wine occasionaly   Drug use: No    Ms.Bolla reports that she quit smoking about 15 years ago. Her smoking use included cigarettes. She started smoking about 56  years ago. She has a 61.5 pack-year smoking history. She has never used smokeless tobacco. She reports current alcohol use. She reports that she does not use drugs.  Tobacco Cessation: Former smoker , 61.5 pack year smoking history   Past surgical hx, Family hx, Social hx all reviewed.  Current Outpatient Medications on File Prior to Visit  Medication Sig   albuterol (PROVENTIL) (2.5 MG/3ML) 0.083% nebulizer solution Take 3 mLs (2.5 mg total) by nebulization every 6 (six) hours as needed for wheezing or shortness of breath.   aspirin EC 81 MG tablet Take 81 mg by mouth in the morning.   atenolol (TENORMIN) 25 MG tablet Take 1 tablet (25 mg total) by mouth 2 (two) times daily.   Blood Glucose Monitor Software DEVI Test BS BID and PRN Dx E11.9   calcium carbonate (OSCAL) 1500 (600 Ca) MG TABS tablet Take 600 mg by mouth in the morning and at bedtime.   Cholecalciferol (VITAMIN D-3 PO) Take 5,000 Units by mouth in the morning.   empagliflozin (JARDIANCE) 10 MG TABS tablet Take 1 tablet (10 mg total) by mouth daily before breakfast.   famotidine (PEPCID) 40 MG tablet Take 1 tablet (40 mg total) by mouth at bedtime.   fluconazole (DIFLUCAN) 150 MG tablet Take 1 tablet (150 mg total) by mouth every three (3) days as needed.   fluticasone-salmeterol (ADVAIR) 250-50 MCG/ACT AEPB Inhale 1 puff into the lungs every 12 (twelve) hours.   glimepiride (AMARYL) 2 MG tablet Take 1 tablet (2 mg total) by mouth daily as needed (blood sugar more than 200.).   glucose blood test strip test BS BID Dx E11.9   isosorbide mononitrate (IMDUR) 30 MG 24 hr tablet Take 1 tablet (30 mg total) by mouth daily.   Magnesium Bisglycinate (MAG GLYCINATE PO) Take by mouth in the morning and at bedtime.   metFORMIN (GLUCOPHAGE) 1000 MG  tablet Take 1 tablet (1,000 mg total) by mouth 2 (two) times daily with a meal.   Multiple Vitamins-Minerals (ADULT ONE DAILY GUMMIES PO) Take 2 tablets by mouth in the morning. Women's Multi by  [employer]   nitroGLYCERIN (NITROSTAT) 0.4 MG SL tablet Place 1 tablet (0.4 mg total) under the tongue every 5 (five) minutes as needed for chest pain.   omeprazole (PRILOSEC) 40 MG capsule Take 1 capsule (40 mg total) by mouth daily.   rosuvastatin (CRESTOR) 10 MG tablet Take 1 tablet by mouth once daily   No current facility-administered medications on file prior to visit.     No Known Allergies  Review Of Systems:  Constitutional:   No  weight loss, night sweats,  Fevers, chills, fatigue, or  lassitude.  HEENT:   No headaches,  Difficulty swallowing,  Tooth/dental problems, or  Sore throat,                No sneezing, itching, ear ache, nasal congestion, post nasal drip,   CV:  No chest pain,  Orthopnea, PND, swelling in lower extremities, anasarca, dizziness, palpitations, syncope.   GI  No heartburn, indigestion, abdominal pain, nausea, vomiting, diarrhea, change in bowel habits, loss of appetite, bloody stools.   Resp: No shortness of breath with exertion or at rest.  No excess mucus, no productive cough,  No non-productive cough,  No coughing up of blood.  No change in color of mucus.  No wheezing.  No chest wall deformity  Skin: no rash or lesions.  GU: no dysuria, change in color of urine, no urgency or frequency.  No flank pain, no hematuria   MS:  No joint pain or swelling.  No decreased range of motion.  No back pain.  Psych:  No change in mood or affect. No depression or anxiety.  No memory loss.   Vital Signs BP (!) 130/56 (BP Location: Right Arm, Patient Position: Sitting, Cuff Size: Normal)   Pulse (!) 102   Ht 5\' 1"  (1.549 m)   Wt 137 lb 3.2 oz (62.2 kg)   SpO2 97%   BMI 25.92 kg/m    Physical Exam:  General- No distress,  A&Ox3, pleasant ENT: No sinus tenderness, TM clear, pale nasal mucosa, no oral exudate,no post nasal drip, no LAN Cardiac: S1, S2, regular rate and rhythm, no murmur Chest: No wheeze/ rales/ dullness; no accessory muscle use, no nasal  flaring, no sternal retractions Abd.: Soft Non-tender, ND, BS +, Body mass index is 25.92 kg/m.  Ext: No clubbing cyanosis, edema Neuro:  normal strength, MAE x 4, A&O x 3, appropriate Skin: No rashes, warm and dry, no lesions  Psych: normal mood and behavior   Assessment/Plan  Pulmonary Nodules Growth of nodules noted on follow-up imaging, with low-grade activity on PET scan. Nonspecific for malignancy, could be benign or low-grade adenocarcinoma. No symptoms of cough, bloody secretions, or weight loss. -Schedule a 32-month follow-up CT scan in April 2025 to re-evaluate nodules. - Follow up with Kandice Robinsons, NP or Dr.Byrum 1 weeks after CT Chest to review results - Consider biopsy if continued growth is observed. - Call for any hemoptysis, unintentional weight loss, or breathing issues so we can see you sooner.  History of Esophageal Cancer Accentuated distal esophageal activity noted on PET scan, nonspecific for physiological activity versus malignancy. Patient reports occasional food impaction and inflammation, but no dysphagia or weight loss. -Refer back to GI for re-evaluation of esophageal activity. - Referral sent and message sent  to Dr. Aaron Edelman, with who she is already established.   Sleep Apnea Patient on CPAP therapy, currently set at 9, but previously at 11. Reports of dry mouth. -Increase CPAP pressure back to 11. -Advise use of mouthwash for dry mouth at night and in the morning. -Schedule follow-up appointment with new sleep doctor (Dr. Lauraine Rinne or Dr. Maple Hudson) in 6 months to evaluate CPAP changes and discuss inhaler use.  Possible GI Concerns Activity noted in anus on PET scan, patient reports no recent colonoscopy. -Recommend follow-up with GI for possible colonoscopy if not up-to-date.  I spent 30 minutes dedicated to the care of this patient on the date of this encounter to include pre-visit review of records, face-to-face time with the patient discussing conditions  above, post visit ordering of testing, clinical documentation with the electronic health record, making appropriate referrals as documented, and communicating necessary information to the patient's healthcare team.   Bevelyn Ngo, NP 01/28/2023  12:07 PM

## 2023-01-30 ENCOUNTER — Other Ambulatory Visit: Payer: Self-pay

## 2023-01-30 ENCOUNTER — Telehealth: Payer: Self-pay

## 2023-01-30 DIAGNOSIS — R1319 Other dysphagia: Secondary | ICD-10-CM

## 2023-01-30 DIAGNOSIS — Z8501 Personal history of malignant neoplasm of esophagus: Secondary | ICD-10-CM

## 2023-01-30 DIAGNOSIS — R948 Abnormal results of function studies of other organs and systems: Secondary | ICD-10-CM

## 2023-01-30 DIAGNOSIS — K222 Esophageal obstruction: Secondary | ICD-10-CM

## 2023-01-30 NOTE — Telephone Encounter (Signed)
Pt scheduled for EGD in the [facility] 03/12/23 at 2:30pm. Pt aware of appt, amb ref in epic. Pt sent instructions via mychart.

## 2023-01-30 NOTE — Telephone Encounter (Signed)
-----   Message from Carie Caddy Pyrtle sent at 01/28/2023  4:02 PM EST ----- Regarding: RE: Physiologic vs malignant activity of esophogus on PET scan Thanks for the message I agree  Bonita Quin, I would recommend [facility] EGD for hx of esophageal cancer and abnl PET scan This can be direct unless there is an issue I am overlooking. Thanks JMP ----- Message ----- From: Bevelyn Ngo, NP Sent: 01/28/2023  11:41 AM EST To: Beverley Fiedler, MD Subject: Physiologic vs malignant activity of esophog#  This patient has a history of stage 1 esophageal cancer. Treated with surgery at [facility]. She had a PET scan for lung nodules , and there is an accentuated distal esophageal activity, maximum SUV 3.4,nonspecific for physiologic activity versus malignancy. With her history, I think she needs to follow up with you all again. Can your office reach out to get her scheduled? I will also place a referral.

## 2023-01-30 NOTE — Progress Notes (Signed)
Marland Kitchen

## 2023-02-10 ENCOUNTER — Telehealth: Payer: Self-pay | Admitting: Family

## 2023-02-10 DIAGNOSIS — E1169 Type 2 diabetes mellitus with other specified complication: Secondary | ICD-10-CM

## 2023-02-10 MED ORDER — EMPAGLIFLOZIN 10 MG PO TABS: 10.0000 mg | ORAL_TABLET | Freq: Every day | ORAL | 0 refills | Status: DC

## 2023-02-10 NOTE — Telephone Encounter (Signed)
Pt is scheduled to see PCP on 3/24 for 3 mth ck as advised by PCP when pt had a video visit with her on 01/01/23.  Pt needs enough medicine sent in to last her until this appt. Pt specifically requesting Jardiance.

## 2023-02-10 NOTE — Telephone Encounter (Signed)
SENT IN TO PHARMACY.

## 2023-03-03 ENCOUNTER — Encounter: Payer: Self-pay | Admitting: Internal Medicine

## 2023-03-10 ENCOUNTER — Encounter: Payer: Self-pay | Admitting: Certified Registered Nurse Anesthetist

## 2023-03-12 ENCOUNTER — Encounter: Payer: Self-pay | Admitting: Internal Medicine

## 2023-03-12 ENCOUNTER — Ambulatory Visit: Payer: PPO | Admitting: Internal Medicine

## 2023-03-12 VITALS — BP 123/84 | HR 110 | Temp 96.3°F | Resp 22 | Ht 60.0 in | Wt 136.8 lb

## 2023-03-12 DIAGNOSIS — I2510 Atherosclerotic heart disease of native coronary artery without angina pectoris: Secondary | ICD-10-CM | POA: Diagnosis not present

## 2023-03-12 DIAGNOSIS — Z8501 Personal history of malignant neoplasm of esophagus: Secondary | ICD-10-CM

## 2023-03-12 DIAGNOSIS — K2289 Other specified disease of esophagus: Secondary | ICD-10-CM | POA: Diagnosis not present

## 2023-03-12 DIAGNOSIS — K219 Gastro-esophageal reflux disease without esophagitis: Secondary | ICD-10-CM

## 2023-03-12 DIAGNOSIS — K449 Diaphragmatic hernia without obstruction or gangrene: Secondary | ICD-10-CM | POA: Diagnosis not present

## 2023-03-12 DIAGNOSIS — G4730 Sleep apnea, unspecified: Secondary | ICD-10-CM | POA: Diagnosis not present

## 2023-03-12 DIAGNOSIS — J449 Chronic obstructive pulmonary disease, unspecified: Secondary | ICD-10-CM | POA: Diagnosis not present

## 2023-03-12 DIAGNOSIS — R933 Abnormal findings on diagnostic imaging of other parts of digestive tract: Secondary | ICD-10-CM | POA: Diagnosis not present

## 2023-03-12 DIAGNOSIS — R948 Abnormal results of function studies of other organs and systems: Secondary | ICD-10-CM

## 2023-03-12 DIAGNOSIS — E119 Type 2 diabetes mellitus without complications: Secondary | ICD-10-CM | POA: Diagnosis not present

## 2023-03-12 MED ORDER — SODIUM CHLORIDE 0.9 % IV SOLN: 500.0000 mL | INTRAVENOUS | Status: DC

## 2023-03-12 NOTE — Progress Notes (Signed)
 Pt's states no medical or surgical changes since previsit or office visit.

## 2023-03-12 NOTE — Progress Notes (Signed)
 1440 Robinul 0.1 mg IV given due large amount of secretions upon assessment.  Required albuterol neb 3 days ago ---03/09/23. States she is SOB presently, vss  MD made aware and albuterol neb given before EGD.

## 2023-03-12 NOTE — Progress Notes (Signed)
 Report given to PACU, vss

## 2023-03-12 NOTE — Op Note (Signed)
 [facility] Patient Name: Erica Braun Procedure Date: 03/12/2023 2:48 PM MRN: [id number] Endoscopist: Beverley Fiedler , MD, [id number] Age: 73 Referring MD:  Date of Birth: 05-12-1950 Gender: Female Account #: [id number] Procedure:                Upper GI endoscopy Indications:              Abnormal PET scan of the GI tract (distal esophagus                            activity); history of esophageal sq cell cancer s/p                            ESD Medicines:                Monitored Anesthesia Care Procedure:                Pre-Anesthesia Assessment:                           - Prior to the procedure, a History and Physical                            was performed, and patient medications and                            allergies were reviewed. The patient's tolerance of                            previous anesthesia was also reviewed. The risks                            and benefits of the procedure and the sedation                            options and risks were discussed with the patient.                            All questions were answered, and informed consent                            was obtained. Prior Anticoagulants: The patient has                            taken no anticoagulant or antiplatelet agents. ASA                            Grade Assessment: III - A patient with severe                            systemic disease. After reviewing the risks and                            benefits, the patient was deemed in satisfactory  condition to undergo the procedure.                           After obtaining informed consent, the endoscope was                            passed under direct vision. Throughout the                            procedure, the patient's blood pressure, pulse, and                            oxygen saturations were monitored continuously. The                            GIF [id number] #[id number] was introduced  through the                            mouth, and advanced to the second part of duodenum.                            The upper GI endoscopy was accomplished without                            difficulty. The patient tolerated the procedure                            well. Scope In: Scope Out: Findings:                 Localized mild mucosal variance characterized by                            minor nodularity was found in the distal esophagus.                            Biopsies were taken with a cold forceps for                            histology.                           The exam of the esophagus was otherwise normal.                           A 1-2 cm hiatal hernia was present.                           The entire examined stomach was normal.                           The examined duodenum was normal. Complications:            No immediate complications. Estimated Blood Loss:     Estimated blood loss was minimal. Impression:               -  Esophageal mucosal variant (distal esophagus).                            Biopsied.                           - 1-2 cm hiatal hernia.                           - Normal stomach.                           - Normal examined duodenum. Recommendation:           - Patient has a contact number available for                            emergencies. The signs and symptoms of potential                            delayed complications were discussed with the                            patient. Return to normal activities tomorrow.                            Written discharge instructions were provided to the                            patient.                           - Resume previous diet.                           - Continue present medications.                           - Await pathology results. Beverley Fiedler, MD 03/12/2023 3:11:48 PM This report has been signed electronically.

## 2023-03-12 NOTE — Progress Notes (Signed)
1453 HR > 100 with esmolol 25 mg given IV, MD updated, vss

## 2023-03-12 NOTE — Progress Notes (Signed)
 GASTROENTEROLOGY PROCEDURE H&P NOTE   Primary Care Physician: Junie Spencer, [name]    Reason for Procedure:  History of esophageal squamous cell cancer status post ESD, recent PET scan showing distal esophageal activity rule out recurrence  Plan:    EGD  Patient is appropriate for endoscopic procedure(s) in the ambulatory ([facility]) setting.  The nature of the procedure, as well as the risks, benefits, and alternatives were carefully and thoroughly reviewed with the patient. Ample time for discussion and questions allowed. The patient understood, was satisfied, and agreed to proceed.     HPI: Suhailah Illyana Braun is a 73 y.o. female who presents for EGD.  Medical history as below.  No recent chest pain.  Baseline shortness of breath with COPD.  She did use her inhaler 2 days ago.  No abdominal pain today.  Past Medical History:  Diagnosis Date   Complication of anesthesia    COPD (chronic obstructive pulmonary disease) (HCC)    Coronary artery disease    Diabetes mellitus without complication (HCC)    Diverticulosis    Early cataracts, bilateral 11/21/2016   Esophageal cancer (HCC)    GERD (gastroesophageal reflux disease)    Glaucoma    patient denies   Hyperlipidemia    Hypertension    Internal hemorrhoids    Osteoporosis 12/16/2021   Pneumonia    PONV (postoperative nausea and vomiting)    Sleep apnea    uses Cpap   Spasm of the cricopharyngeus muscle    Squamous cell esophageal cancer (HCC) 05/10/2018    Past Surgical History:  Procedure Laterality Date   ABDOMINAL HYSTERECTOMY     cervical dysplasia   BIOPSY  05/03/2018   Procedure: BIOPSY;  Surgeon: Lemar Lofty., MD;  Location: [facility] ENDOSCOPY;  Service: Gastroenterology;;   BREAST BIOPSY Left    BREAST BIOPSY Right 11/27/2021   MM RT BREAST BX W LOC DEV 1ST LESION IMAGE BX SPEC STEREO GUIDE 11/27/2021 [facility]   BREAST BIOPSY  01/14/2022   MM RT RADIOACTIVE SEED LOC MAMMO GUIDE 01/14/2022  [facility]   BREAST LUMPECTOMY Left    BREAST LUMPECTOMY WITH RADIOACTIVE SEED LOCALIZATION Right 01/15/2022   Procedure: RIGHT BREAST LUMPECTOMY WITH RADIOACTIVE SEED LOCALIZATION;  Surgeon: Harriette Bouillon, MD;  Location: [facility] OR;  Service: General;  Laterality: Right;   ESOPHAGOGASTRODUODENOSCOPY (EGD) WITH PROPOFOL N/A 05/03/2018   Procedure: ESOPHAGOGASTRODUODENOSCOPY (EGD) WITH PROPOFOL;  Surgeon: Lemar Lofty., MD;  Location: [facility] ENDOSCOPY;  Service: Gastroenterology;  Laterality: N/A;   ESOPHAGOGASTRODUODENOSCOPY (EGD) WITH PROPOFOL N/A 11/18/2018   Procedure: ESOPHAGOGASTRODUODENOSCOPY (EGD) WITH PROPOFOL;  Surgeon: Hilarie Fredrickson, MD;  Location: [facility] ENDOSCOPY;  Service: Endoscopy;  Laterality: N/A;   EUS  05/03/2018   Procedure: UPPER ENDOSCOPIC ULTRASOUND (EUS) RADIAL;  Surgeon: Lemar Lofty., MD;  Location: [facility] ENDOSCOPY;  Service: Gastroenterology;;   FOREIGN BODY REMOVAL  11/18/2018   Procedure: FOREIGN BODY REMOVAL;  Surgeon: Hilarie Fredrickson, MD;  Location: [facility] ENDOSCOPY;  Service: Endoscopy;;   LEFT HEART CATH AND CORONARY ANGIOGRAPHY N/A 06/11/2017   Procedure: LEFT HEART CATH AND CORONARY ANGIOGRAPHY;  Surgeon: Lyn Records, MD;  Location: [facility] INVASIVE CV LAB;  Service: Cardiovascular;  Laterality: N/A;    Prior to Admission medications   Medication Sig Start Date End Date Taking? Authorizing Provider  albuterol (PROVENTIL) (2.5 MG/3ML) 0.083% nebulizer solution Take 3 mLs (2.5 mg total) by nebulization every 6 (six) hours as needed for wheezing or shortness of breath. 08/05/21  Yes Lendon Colonel, [place]  A, [name]  aspirin EC 81 MG tablet Take 81 mg by mouth in the morning.   Yes [provider]  empagliflozin (JARDIANCE) 10 MG TABS tablet Take 1 tablet (10 mg total) by mouth daily before breakfast. 02/10/23  Yes Hawks, Christy A, [name]  famotidine (PEPCID) 40 MG tablet Take 1 tablet (40 mg total) by mouth at bedtime. 01/01/23  Yes Hawks, Christy A, [name]   fluticasone-salmeterol (ADVAIR) 250-50 MCG/ACT AEPB Inhale 1 puff into the lungs every 12 (twelve) hours. 03/10/22  Yes Coralyn Helling, MD  glimepiride (AMARYL) 2 MG tablet Take 1 tablet (2 mg total) by mouth daily as needed (blood sugar more than 200.). 01/01/23  Yes Hawks, Christy A, [name]  isosorbide mononitrate (IMDUR) 30 MG 24 hr tablet Take 1 tablet (30 mg total) by mouth daily. 09/03/22  Yes Wendall Stade, MD  metFORMIN (GLUCOPHAGE) 1000 MG tablet Take 1 tablet (1,000 mg total) by mouth 2 (two) times daily with a meal. 01/01/23  Yes Hawks, Christy A, [name]  Multiple Vitamins-Minerals (ADULT ONE DAILY GUMMIES PO) Take 2 tablets by mouth in the morning. Women's Multi by Vita[employer]usion   Yes [provider]  omeprazole (PRILOSEC) 40 MG capsule Take 1 capsule (40 mg total) by mouth daily. 01/01/23  Yes Hawks, Neysa Bonito A, [name]  rosuvastatin (CRESTOR) 10 MG tablet Take 1 tablet by mouth once daily 10/08/22  Yes Wendall Stade, MD  atenolol (TENORMIN) 25 MG tablet Take 1 tablet (25 mg total) by mouth 2 (two) times daily. 01/09/23 04/09/23  Wendall Stade, MD  Blood Glucose Monitor Software DEVI Test BS BID and PRN Dx E11.9 03/31/18   Sonny Masters, [name]  calcium carbonate (OSCAL) 1500 (600 Ca) MG TABS tablet Take 600 mg by mouth in the morning and at bedtime. Patient not taking: Reported on 03/12/2023    [provider]  Cholecalciferol (VITAMIN D-3 PO) Take 5,000 Units by mouth in the morning. Patient not taking: Reported on 03/12/2023    [provider]  fluconazole (DIFLUCAN) 150 MG tablet Take 1 tablet (150 mg total) by mouth every three (3) days as needed. 01/01/23   [name] A, [name]  glucose blood test strip test BS BID Dx E11.9 03/30/18   Sonny Masters, [name]  Magnesium Bisglycinate (MAG GLYCINATE PO) Take by mouth in the morning and at bedtime.    [provider]  nitroGLYCERIN (NITROSTAT) 0.4 MG SL tablet Place 1 tablet (0.4 mg total) under the tongue every 5 (five)  minutes as needed for chest pain. 07/04/22 07/04/23  Wendall Stade, MD    Current Outpatient Medications  Medication Sig Dispense Refill   albuterol (PROVENTIL) (2.5 MG/3ML) 0.083% nebulizer solution Take 3 mLs (2.5 mg total) by nebulization every 6 (six) hours as needed for wheezing or shortness of breath. 75 mL 3   aspirin EC 81 MG tablet Take 81 mg by mouth in the morning.     empagliflozin (JARDIANCE) 10 MG TABS tablet Take 1 tablet (10 mg total) by mouth daily before breakfast. 90 tablet 0   famotidine (PEPCID) 40 MG tablet Take 1 tablet (40 mg total) by mouth at bedtime. 90 tablet 3   fluticasone-salmeterol (ADVAIR) 250-50 MCG/ACT AEPB Inhale 1 puff into the lungs every 12 (twelve) hours. 60 each 11   glimepiride (AMARYL) 2 MG tablet Take 1 tablet (2 mg total) by mouth daily as needed (blood sugar more than 200.). 90 tablet 1   isosorbide mononitrate (IMDUR) 30 MG 24  hr tablet Take 1 tablet (30 mg total) by mouth daily. 45 tablet 3   metFORMIN (GLUCOPHAGE) 1000 MG tablet Take 1 tablet (1,000 mg total) by mouth 2 (two) times daily with a meal. 200 tablet 0   Multiple Vitamins-Minerals (ADULT ONE DAILY GUMMIES PO) Take 2 tablets by mouth in the morning. Women's Multi by VitaFusion     omeprazole (PRILOSEC) 40 MG capsule Take 1 capsule (40 mg total) by mouth daily. 180 capsule 0   rosuvastatin (CRESTOR) 10 MG tablet Take 1 tablet by mouth once daily 90 tablet 3   atenolol (TENORMIN) 25 MG tablet Take 1 tablet (25 mg total) by mouth 2 (two) times daily. 180 tablet 3   Blood Glucose Monitor Software DEVI Test BS BID and PRN Dx E11.9 1 Device 1   calcium carbonate (OSCAL) 1500 (600 Ca) MG TABS tablet Take 600 mg by mouth in the morning and at bedtime. (Patient not taking: Reported on 03/12/2023)     Cholecalciferol (VITAMIN D-3 PO) Take 5,000 Units by mouth in the morning. (Patient not taking: Reported on 03/12/2023)     fluconazole (DIFLUCAN) 150 MG tablet Take 1 tablet (150 mg total) by mouth every  three (3) days as needed. 3 tablet 0   glucose blood test strip test BS BID Dx E11.9 200 each 3   Magnesium Bisglycinate (MAG GLYCINATE PO) Take by mouth in the morning and at bedtime.     nitroGLYCERIN (NITROSTAT) 0.4 MG SL tablet Place 1 tablet (0.4 mg total) under the tongue every 5 (five) minutes as needed for chest pain. 25 tablet 3   Current Facility-Administered Medications  Medication Dose Route Frequency Provider Last Rate Last Admin   0.9 %  sodium chloride infusion  500 mL Intravenous Continuous Deborah Dondero, Carie Caddy, MD        Allergies as of 03/12/2023   (No Known Allergies)    Family History  Problem Relation Age of Onset   Breast cancer Mother        dx 68s; mets   Arthritis Mother    COPD Father 40       emphysema   Alcohol abuse Father    Diabetes Father    Breast cancer Sister        dx late 49s; mat half sister   Early death Brother        overdose   Drug abuse Brother    Alcohol abuse Paternal Aunt    COPD Paternal Aunt    Throat cancer Paternal Uncle    Liver cancer Maternal Grandmother        or other primary; d. late 70s   Diabetes Maternal Grandmother    Alcohol abuse Maternal Grandfather    Early death Maternal Grandfather    Stroke Paternal Grandmother    Heart disease Paternal Grandfather    Esophageal cancer Neg Hx    Stomach cancer Neg Hx    Colon cancer Neg Hx     Social History   Socioeconomic History   Marital status: Married    Spouse name: [name]   Number of children: 3   Years of education: 14   Highest education level: Some college, no degree  Occupational History   Occupation: retired    Comment: Radiation protection practitioner  Tobacco Use   Smoking status: Former    Current packs/day: 0.00    Average packs/day: 1.5 packs/day for 41.0 years (61.5 ttl pk-yrs)    Types: Cigarettes    Start date: 1969  Quit date: 2010    Years since quitting: 15.1   Smokeless tobacco: Never  Vaping Use   Vaping status: Never Used  Substance and Sexual Activity    Alcohol use: Yes    Comment: wine occasionaly   Drug use: No   Sexual activity: Yes    Birth control/protection: Post-menopausal  Other Topics Concern   Not on file  Social History Narrative   Recent move from [place] to walk   Married to [name] dog at home   Social Drivers of [employer]: Low Risk  (12/09/2021)   Overall Financial Resource Strain (CARDIA)    Difficulty of Paying Living Expenses: Not hard at all  Food Insecurity: No Food Insecurity (12/09/2021)   Hunger Vital Sign    Worried About Running Out of Food in the Last Year: Never true    Ran Out of Food in the Last Year: Never true  Transportation Needs: No Transportation Needs (12/09/2021)   PRAPARE - Administrator, Civil Service (Medical): No    Lack of Transportation (Non-Medical): No  Physical Activity: Insufficiently Active (12/09/2021)   Exercise Vital Sign    Days of Exercise per Week: 3 days    Minutes of Exercise per Session: 30 min  Stress: No Stress Concern Present (12/09/2021)   [employer] of Occupational Health - Occupational Stress Questionnaire    Feeling of Stress : Only a little  Social Connections: Socially Integrated (12/09/2021)   Social Connection and Isolation Panel [NHANES]    Frequency of Communication with Friends and Family: More than three times a week    Frequency of Social Gatherings with Friends and Family: More than three times a week    Attends Religious Services: More than 4 times per year    Active Member of [employer] or Organizations: Yes    Attends Engineer, structural: More than 4 times per year    Marital Status: Married  Catering manager Violence: Not At Risk (12/09/2021)   Humiliation, Afraid, Rape, and Kick questionnaire    Fear of Current or Ex-Partner: No    Emotionally Abused: No    Physically Abused: No    Sexually Abused: No    Physical Exam: Vital signs in last 24 hours: @BP  121/61   Pulse (!) 101    Temp (!) 96.3 F (35.7 C) (Temporal)   Ht 5' (1.524 m)   Wt 136 lb 12.8 oz (62.1 kg)   SpO2 96%   BMI 26.72 kg/m  GEN: NAD EYE: Sclerae anicteric ENT: MMM CV: Non-tachycardic Pulm: CTA b/l GI: Soft, NT/ND NEURO:  Alert & Oriented x 3   Erick Blinks, MD [facility]  03/12/2023 2:41 PM

## 2023-03-12 NOTE — Patient Instructions (Signed)
 Educational handout provided to patient related to HIATAL HERNIA  Resume previous diet  Continue present medications  Awaiting pathology results  YOU HAD AN ENDOSCOPIC PROCEDURE TODAY AT THE [facility]:   Refer to the procedure report that was given to you for any specific questions about what was found during the examination.  If the procedure report does not answer your questions, please call your gastroenterologist to clarify.  If you requested that your care partner not be given the details of your procedure findings, then the procedure report has been included in a sealed envelope for you to review at your convenience later.  YOU SHOULD EXPECT: Some feelings of bloating in the abdomen. Passage of more gas than usual.  Walking can help get rid of the air that was put into your GI tract during the procedure and reduce the bloating. If you had a lower endoscopy (such as a colonoscopy or flexible sigmoidoscopy) you may notice spotting of blood in your stool or on the toilet paper. If you underwent a bowel prep for your procedure, you may not have a normal bowel movement for a few days.  Please Note:  You might notice some irritation and congestion in your nose or some drainage.  This is from the oxygen used during your procedure.  There is no need for concern and it should clear up in a day or so.  SYMPTOMS TO REPORT IMMEDIATELY:  Following upper endoscopy (EGD)  Vomiting of blood or coffee ground material  New chest pain or pain under the shoulder blades  Painful or persistently difficult swallowing  New shortness of breath  Fever of 100F or higher  Black, tarry-looking stools  For urgent or emergent issues, a gastroenterologist can be reached at any hour by calling [phone]. Do not use MyChart messaging for urgent concerns.    DIET:  We do recommend a small meal at first, but then you may proceed to your regular diet.  Drink plenty of fluids but you should avoid  alcoholic beverages for 24 hours.  ACTIVITY:  You should plan to take it easy for the rest of today and you should NOT DRIVE or use heavy machinery until tomorrow (because of the sedation medicines used during the test).    FOLLOW UP: Our staff will call the number listed on your records the next business day following your procedure.  We will call around 7:15- 8:00 am to check on you and address any questions or concerns that you may have regarding the information given to you following your procedure. If we do not reach you, we will leave a message.     If any biopsies were taken you will be contacted by phone or by letter within the next 1-3 weeks.  Please call us at [phone] if you have not heard about the biopsies in 3 weeks.    SIGNATURES/CONFIDENTIALITY: You and/or your care partner have signed paperwork which will be entered into your electronic medical record.  These signatures attest to the fact that that the information above on your After Visit Summary has been reviewed and is understood.  Full responsibility of the confidentiality of this discharge information lies with you and/or your care-partner.

## 2023-03-12 NOTE — Progress Notes (Signed)
 1507 O2 sat om RA 82 to 83%, albuterol neb given, and MD made aware.

## 2023-03-12 NOTE — Progress Notes (Signed)
 Called to room to assist during endoscopic procedure.  Patient ID and intended procedure confirmed with present staff. Received instructions for my participation in the procedure from the performing physician.

## 2023-03-13 ENCOUNTER — Telehealth: Payer: Self-pay

## 2023-03-13 NOTE — Telephone Encounter (Signed)
 Post procedure follow up call, no answer

## 2023-03-17 LAB — SURGICAL PATHOLOGY

## 2023-03-23 ENCOUNTER — Encounter: Payer: Self-pay | Admitting: Internal Medicine

## 2023-03-24 ENCOUNTER — Inpatient Hospital Stay (HOSPITAL_COMMUNITY)
Admission: EM | Admit: 2023-03-24 | Discharge: 2023-03-26 | DRG: 193 | Disposition: A | Discharge status: Home / Self Care | Attending: Internal Medicine | Admitting: Internal Medicine

## 2023-03-24 ENCOUNTER — Encounter (HOSPITAL_COMMUNITY): Payer: Self-pay | Admitting: *Deleted

## 2023-03-24 ENCOUNTER — Emergency Department (HOSPITAL_COMMUNITY)

## 2023-03-24 ENCOUNTER — Ambulatory Visit: Payer: Self-pay | Admitting: Family

## 2023-03-24 DIAGNOSIS — Z7982 Long term (current) use of aspirin: Secondary | ICD-10-CM

## 2023-03-24 DIAGNOSIS — Z8501 Personal history of malignant neoplasm of esophagus: Secondary | ICD-10-CM | POA: Diagnosis not present

## 2023-03-24 DIAGNOSIS — I10 Essential (primary) hypertension: Secondary | ICD-10-CM | POA: Diagnosis present

## 2023-03-24 DIAGNOSIS — Z825 Family history of asthma and other chronic lower respiratory diseases: Secondary | ICD-10-CM

## 2023-03-24 DIAGNOSIS — M810 Age-related osteoporosis without current pathological fracture: Secondary | ICD-10-CM | POA: Diagnosis present

## 2023-03-24 DIAGNOSIS — K2100 Gastro-esophageal reflux disease with esophagitis, without bleeding: Secondary | ICD-10-CM | POA: Diagnosis not present

## 2023-03-24 DIAGNOSIS — J189 Pneumonia, unspecified organism: Secondary | ICD-10-CM | POA: Diagnosis not present

## 2023-03-24 DIAGNOSIS — Z8701 Personal history of pneumonia (recurrent): Secondary | ICD-10-CM

## 2023-03-24 DIAGNOSIS — R911 Solitary pulmonary nodule: Secondary | ICD-10-CM | POA: Diagnosis not present

## 2023-03-24 DIAGNOSIS — J431 Panlobular emphysema: Secondary | ICD-10-CM | POA: Diagnosis not present

## 2023-03-24 DIAGNOSIS — E785 Hyperlipidemia, unspecified: Secondary | ICD-10-CM | POA: Diagnosis not present

## 2023-03-24 DIAGNOSIS — R0902 Hypoxemia: Secondary | ICD-10-CM | POA: Diagnosis not present

## 2023-03-24 DIAGNOSIS — G4733 Obstructive sleep apnea (adult) (pediatric): Secondary | ICD-10-CM

## 2023-03-24 DIAGNOSIS — Z87891 Personal history of nicotine dependence: Secondary | ICD-10-CM

## 2023-03-24 DIAGNOSIS — E782 Mixed hyperlipidemia: Secondary | ICD-10-CM | POA: Diagnosis present

## 2023-03-24 DIAGNOSIS — Z79899 Other long term (current) drug therapy: Secondary | ICD-10-CM

## 2023-03-24 DIAGNOSIS — Z7984 Long term (current) use of oral hypoglycemic drugs: Secondary | ICD-10-CM

## 2023-03-24 DIAGNOSIS — Z833 Family history of diabetes mellitus: Secondary | ICD-10-CM

## 2023-03-24 DIAGNOSIS — J101 Influenza due to other identified influenza virus with other respiratory manifestations: Principal | ICD-10-CM | POA: Diagnosis present

## 2023-03-24 DIAGNOSIS — Z803 Family history of malignant neoplasm of breast: Secondary | ICD-10-CM

## 2023-03-24 DIAGNOSIS — J449 Chronic obstructive pulmonary disease, unspecified: Secondary | ICD-10-CM | POA: Diagnosis present

## 2023-03-24 DIAGNOSIS — I2510 Atherosclerotic heart disease of native coronary artery without angina pectoris: Secondary | ICD-10-CM | POA: Diagnosis present

## 2023-03-24 DIAGNOSIS — Z90710 Acquired absence of both cervix and uterus: Secondary | ICD-10-CM

## 2023-03-24 DIAGNOSIS — Z87410 Personal history of cervical dysplasia: Secondary | ICD-10-CM

## 2023-03-24 DIAGNOSIS — J111 Influenza due to unidentified influenza virus with other respiratory manifestations: Secondary | ICD-10-CM | POA: Diagnosis not present

## 2023-03-24 DIAGNOSIS — E1169 Type 2 diabetes mellitus with other specified complication: Secondary | ICD-10-CM | POA: Diagnosis not present

## 2023-03-24 DIAGNOSIS — J9601 Acute respiratory failure with hypoxia: Secondary | ICD-10-CM | POA: Diagnosis not present

## 2023-03-24 DIAGNOSIS — Z7951 Long term (current) use of inhaled steroids: Secondary | ICD-10-CM

## 2023-03-24 DIAGNOSIS — Z8249 Family history of ischemic heart disease and other diseases of the circulatory system: Secondary | ICD-10-CM

## 2023-03-24 DIAGNOSIS — E119 Type 2 diabetes mellitus without complications: Secondary | ICD-10-CM

## 2023-03-24 DIAGNOSIS — Z823 Family history of stroke: Secondary | ICD-10-CM

## 2023-03-24 DIAGNOSIS — Z1152 Encounter for screening for COVID-19: Secondary | ICD-10-CM

## 2023-03-24 DIAGNOSIS — Z800 Family history of malignant neoplasm of digestive organs: Secondary | ICD-10-CM

## 2023-03-24 LAB — CBC WITH DIFFERENTIAL/PLATELET
Abs Immature Granulocytes: 0.02 10*3/uL (ref 0.00–0.07)
Basophils Absolute: 0 10*3/uL (ref 0.0–0.1)
Basophils Relative: 0 %
Eosinophils Absolute: 0 10*3/uL (ref 0.0–0.5)
Eosinophils Relative: 0 %
HCT: 35 % — ABNORMAL LOW (ref 36.0–46.0)
Hemoglobin: 10.9 g/dL — ABNORMAL LOW (ref 12.0–15.0)
Immature Granulocytes: 0 %
Lymphocytes Relative: 7 %
Lymphs Abs: 0.3 10*3/uL — ABNORMAL LOW (ref 0.7–4.0)
MCH: 26.6 pg (ref 26.0–34.0)
MCHC: 31.1 g/dL (ref 30.0–36.0)
MCV: 85.4 fL (ref 80.0–100.0)
Monocytes Absolute: 0.4 10*3/uL (ref 0.1–1.0)
Monocytes Relative: 9 %
Neutro Abs: 3.9 10*3/uL (ref 1.7–7.7)
Neutrophils Relative %: 84 %
Platelets: 110 10*3/uL — ABNORMAL LOW (ref 150–400)
RBC: 4.1 MIL/uL (ref 3.87–5.11)
RDW: 15.1 % (ref 11.5–15.5)
WBC: 4.6 10*3/uL (ref 4.0–10.5)
nRBC: 0 % (ref 0.0–0.2)

## 2023-03-24 LAB — COMPREHENSIVE METABOLIC PANEL
ALT: 14 U/L (ref 0–44)
AST: 15 U/L (ref 15–41)
Albumin: 3.6 g/dL (ref 3.5–5.0)
Alkaline Phosphatase: 76 U/L (ref 38–126)
Anion gap: 11 (ref 5–15)
BUN: 19 mg/dL (ref 8–23)
CO2: 26 mmol/L (ref 22–32)
Calcium: 9.1 mg/dL (ref 8.9–10.3)
Chloride: 95 mmol/L — ABNORMAL LOW (ref 98–111)
Creatinine, Ser: 0.7 mg/dL (ref 0.44–1.00)
GFR, Estimated: >60 mL/min (ref >60)
Glucose, Bld: 200 mg/dL — ABNORMAL HIGH (ref 70–99)
Potassium: 3.9 mmol/L (ref 3.5–5.1)
Sodium: 132 mmol/L — ABNORMAL LOW (ref 135–145)
Total Bilirubin: 0.5 mg/dL (ref 0.0–1.2)
Total Protein: 7.5 g/dL (ref 6.5–8.1)

## 2023-03-24 LAB — APTT: aPTT: 28 s (ref 24–36)

## 2023-03-24 LAB — TSH: TSH: 1.047 u[IU]/mL (ref 0.350–4.500)

## 2023-03-24 LAB — PROTIME-INR
INR: 1 (ref 0.8–1.2)
Prothrombin Time: 13.1 s (ref 11.4–15.2)

## 2023-03-24 LAB — LACTIC ACID, PLASMA: Lactic Acid, Venous: 1 mmol/L (ref 0.5–1.9)

## 2023-03-24 LAB — MAGNESIUM: Magnesium: 2.2 mg/dL (ref 1.7–2.4)

## 2023-03-24 LAB — RESP PANEL BY RT-PCR (RSV, FLU A&B, COVID)  RVPGX2
Influenza A by PCR: POSITIVE — AB
Influenza B by PCR: NEGATIVE
Resp Syncytial Virus by PCR: NEGATIVE
SARS Coronavirus 2 by RT PCR: NEGATIVE

## 2023-03-24 MED ORDER — ONDANSETRON HCL 4 MG PO TABS: 4.0000 mg | ORAL_TABLET | Freq: Four times a day (QID) | ORAL | Status: DC | PRN

## 2023-03-24 MED ORDER — ENOXAPARIN SODIUM 40 MG/0.4ML IJ SOSY
40.0000 mg | PREFILLED_SYRINGE | INTRAMUSCULAR | Status: DC
  Administered 2023-03-24 – 2023-03-25 (×2): 40 mg via SUBCUTANEOUS
  Filled 2023-03-24 (×2): qty 0.4

## 2023-03-24 MED ORDER — SODIUM CHLORIDE 0.9 % IV SOLN
500.0000 mg | Freq: Once | INTRAVENOUS | Status: DC
  Filled 2023-03-24: qty 5

## 2023-03-24 MED ORDER — PANTOPRAZOLE SODIUM 40 MG PO TBEC
40.0000 mg | DELAYED_RELEASE_TABLET | Freq: Every day | ORAL | Status: DC
  Administered 2023-03-24 – 2023-03-26 (×3): 40 mg via ORAL
  Filled 2023-03-24 (×3): qty 1

## 2023-03-24 MED ORDER — OSELTAMIVIR PHOSPHATE 30 MG PO CAPS
30.0000 mg | ORAL_CAPSULE | Freq: Two times a day (BID) | ORAL | Status: DC
  Administered 2023-03-24 – 2023-03-26 (×3): 30 mg via ORAL
  Filled 2023-03-24 (×3): qty 1

## 2023-03-24 MED ORDER — BUDESONIDE 0.5 MG/2ML IN SUSP
0.5000 mg | Freq: Two times a day (BID) | RESPIRATORY_TRACT | Status: DC
  Administered 2023-03-24 – 2023-03-26 (×4): 0.5 mg via RESPIRATORY_TRACT
  Filled 2023-03-24 (×4): qty 2

## 2023-03-24 MED ORDER — ACETAMINOPHEN 325 MG PO TABS: 650.0000 mg | ORAL_TABLET | Freq: Four times a day (QID) | ORAL | Status: DC | PRN

## 2023-03-24 MED ORDER — ASPIRIN 81 MG PO TBEC
81.0000 mg | DELAYED_RELEASE_TABLET | Freq: Every morning | ORAL | Status: DC
Start: 2023-03-25 — End: 2023-03-26
  Administered 2023-03-25 – 2023-03-26 (×2): 81 mg via ORAL
  Filled 2023-03-24 (×2): qty 1

## 2023-03-24 MED ORDER — ISOSORBIDE MONONITRATE ER 30 MG PO TB24
15.0000 mg | ORAL_TABLET | Freq: Every day | ORAL | Status: DC
  Administered 2023-03-25 – 2023-03-26 (×2): 15 mg via ORAL
  Filled 2023-03-24 (×2): qty 1

## 2023-03-24 MED ORDER — ACETAMINOPHEN 500 MG PO TABS
1000.0000 mg | ORAL_TABLET | Freq: Once | ORAL | Status: AC
  Administered 2023-03-24: 1000 mg via ORAL
  Filled 2023-03-24: qty 2

## 2023-03-24 MED ORDER — ONDANSETRON HCL 4 MG/2ML IJ SOLN
4.0000 mg | Freq: Four times a day (QID) | INTRAMUSCULAR | Status: DC | PRN
Start: 2023-03-24 — End: 2023-03-26

## 2023-03-24 MED ORDER — FAMOTIDINE 20 MG PO TABS
40.0000 mg | ORAL_TABLET | Freq: Every day | ORAL | Status: DC
  Administered 2023-03-24 – 2023-03-25 (×2): 40 mg via ORAL
  Filled 2023-03-24 (×2): qty 2

## 2023-03-24 MED ORDER — SODIUM CHLORIDE 0.9 % IV SOLN
2.0000 g | Freq: Once | INTRAVENOUS | Status: AC
  Administered 2023-03-24: 2 g via INTRAVENOUS
  Filled 2023-03-24: qty 20

## 2023-03-24 MED ORDER — IPRATROPIUM-ALBUTEROL 0.5-2.5 (3) MG/3ML IN SOLN
3.0000 mL | Freq: Four times a day (QID) | RESPIRATORY_TRACT | Status: DC | PRN
  Administered 2023-03-25 (×2): 3 mL via RESPIRATORY_TRACT
  Filled 2023-03-24 (×2): qty 3

## 2023-03-24 MED ORDER — ROSUVASTATIN CALCIUM 20 MG PO TABS
10.0000 mg | ORAL_TABLET | Freq: Every day | ORAL | Status: DC
  Administered 2023-03-25 – 2023-03-26 (×2): 10 mg via ORAL
  Filled 2023-03-24 (×2): qty 1

## 2023-03-24 MED ORDER — PREDNISONE 20 MG PO TABS
40.0000 mg | ORAL_TABLET | Freq: Every day | ORAL | Status: DC
  Administered 2023-03-24 – 2023-03-26 (×3): 40 mg via ORAL
  Filled 2023-03-24 (×3): qty 2

## 2023-03-24 MED ORDER — ISOSORBIDE MONONITRATE ER 30 MG PO TB24
30.0000 mg | ORAL_TABLET | Freq: Every day | ORAL | Status: DC
  Administered 2023-03-24: 15 mg via ORAL
  Filled 2023-03-24: qty 1

## 2023-03-24 MED ORDER — SODIUM CHLORIDE 0.9 % IV BOLUS (SEPSIS)
500.0000 mL | Freq: Once | INTRAVENOUS | Status: AC
  Administered 2023-03-24: 500 mL via INTRAVENOUS

## 2023-03-24 MED ORDER — LACTATED RINGERS IV SOLN: INTRAVENOUS | Status: AC

## 2023-03-24 MED ORDER — ATENOLOL 25 MG PO TABS
25.0000 mg | ORAL_TABLET | Freq: Two times a day (BID) | ORAL | Status: DC
  Administered 2023-03-24: 25 mg via ORAL
  Filled 2023-03-24 (×3): qty 1

## 2023-03-24 MED ORDER — OSELTAMIVIR PHOSPHATE 30 MG PO CAPS: 30.0000 mg | ORAL_CAPSULE | Freq: Once | ORAL | Status: DC

## 2023-03-24 MED ORDER — DM-GUAIFENESIN ER 30-600 MG PO TB12
1.0000 | ORAL_TABLET | Freq: Two times a day (BID) | ORAL | Status: DC
  Administered 2023-03-24 – 2023-03-26 (×5): 1 via ORAL
  Filled 2023-03-24 (×5): qty 1

## 2023-03-24 NOTE — Assessment & Plan Note (Signed)
-  Holding oral hypoglycemic agents while inpatient -Sliding scale insulin initiated -Follow CBG fluctuation -Anticipating elevated blood sugar with the use of his steroids therapy

## 2023-03-24 NOTE — Plan of Care (Signed)
   Problem: Education: Goal: Knowledge of General Education information will improve Description: Including pain rating scale, medication(s)/side effects and non-pharmacologic comfort measures Outcome: Progressing   Problem: Health Behavior/Discharge Planning: Goal: Ability to manage health-related needs will improve Outcome: Progressing   Problem: Activity: Goal: Risk for activity intolerance will decrease Outcome: Progressing

## 2023-03-24 NOTE — ED Triage Notes (Signed)
 Pt in c/o SOB onset x4 days ago with worsening today with productive cough with thick yellow sputum, nasal congestion, denies sore throat,  denies n/v/d, pt called her PCP, Dr. [name], and was told to come here by, pt O2 Sats 89% on RA, pt placed on 2 L in triage

## 2023-03-24 NOTE — TOC Initial Note (Signed)
 Transition of Care [facility]) - Initial/Assessment Note    Patient Details  Name: Erica Braun MRN: [id number] Date of Birth: 1950/01/25  Transition of Care [facility]) CM/SW Contact:    Barron Alvine, RN Phone Number: 03/24/2023, 8:51 PM  Clinical Narrative:                 Transition of Care Department [facility]) has reviewed patient and no other TOC needs have been identified at this time. We will continue to monitor patient advancement through interdisciplinary progression rounds. If new patient transition needs arise, please place a TOC consult.        Patient Goals and CMS Choice            Expected Discharge Plan and Services                                              Prior Living Arrangements/Services                       Activities of Daily Living   ADL Screening (condition at time of admission) Independently performs ADLs?: Yes (appropriate for developmental age) Is the patient deaf or have difficulty hearing?: No Does the patient have difficulty seeing, even when wearing glasses/contacts?: No Does the patient have difficulty concentrating, remembering, or making decisions?: No  Permission Sought/Granted                  Emotional Assessment              Admission diagnosis:  Acute respiratory failure with hypoxia (HCC) [J96.01] Patient Active Problem List   Diagnosis Date Noted   Acute respiratory failure with hypoxia (HCC) 03/24/2023   Influenza A 03/24/2023   Osteoporosis 12/16/2021   History of esophageal cancer 08/05/2021   Esophageal dysphagia 11/21/2018   Esophageal stenosis 11/21/2018   Esophageal stricture    CAD in native artery 06/11/2017   Abnormal nuclear cardiac imaging test 06/11/2017   Angina pectoris (HCC)    OSA on CPAP 12/19/2016   Diabetes mellitus (HCC) 11/21/2016   GERD with esophagitis 11/21/2016   History of esophageal stricture 11/21/2016   Hypertension associated with diabetes (HCC)  11/21/2016   Hyperlipidemia associated with type 2 diabetes mellitus (HCC) 11/21/2016   COPD (chronic obstructive pulmonary disease) (HCC) 11/21/2016   Early cataracts, bilateral 11/21/2016   PCP:  Junie Spencer, [name] Pharmacy:   [facility] [address] [place] - [address] [place] [address] Phone: [phone] Fax: [phone]     Social Drivers of Health (SDOH) Social History: SDOH Screenings   Food Insecurity: No Food Insecurity (03/24/2023)  Housing: Low Risk  (03/24/2023)  Transportation Needs: No Transportation Needs (03/24/2023)  Utilities: Not At Risk (03/24/2023)  Alcohol Screen: Low Risk  (12/09/2021)  Depression (PHQ2-9): Low Risk  (04/14/2022)  Financial Resource Strain: Low Risk  (12/09/2021)  Physical Activity: Insufficiently Active (12/09/2021)  Social Connections: Socially Integrated (03/24/2023)  Stress: No Stress Concern Present (12/09/2021)  Tobacco Use: Medium Risk (03/24/2023)   SDOH Interventions:     Readmission Risk Interventions     No data to display

## 2023-03-24 NOTE — Assessment & Plan Note (Signed)
 Continue statin.

## 2023-03-24 NOTE — Assessment & Plan Note (Signed)
 continue PPI

## 2023-03-24 NOTE — H&P (Signed)
 History and Physical    Patient: Erica Braun QMV:[id number] DOB: [birth year] DOA: 03/24/2023 DOS: the patient was seen and examined on 03/24/2023 PCP: Junie Spencer, [name]  Patient coming from: Home  Chief Complaint:  Chief Complaint  Patient presents with   Shortness of Breath   HPI: Erica Braun is a 73 y.o. female with medical history significant of COPD, obstructive sleep apnea (chronically on CPAP), hypertension, hyperlipidemia, gastroesophageal reflux disease, prior history of squamous cell esophageal cancer and type 2 diabetes; who presented to the hospital secondary to increased shortness of breath, fever, general malaise and intermittent coughing spells.  Patient reports her husband has similar URI symptoms and ended requiring therapy. Per patient symptoms started approximately 3-4 days ago and worsening.  She reports having chills and temperature of 101.6 at home.  No hemoptysis, clear to yellow sputum production in appearance and no associated chest discomfort.  Patient expressed no focal weaknesses, no dysuria, no hematuria, no melena, no hematochezia, no abdominal pain, no headaches, blurry vision or any other complaints.  Workup in the ED demonstrating no acute infiltrates on x-ray; elevated WBCs and lactic acid.  Respiratory panel positive for influenza A infection.  Patient febrile and demonstrating hypoxia.  Tamiflu started, oxygen supplementation provided and TRH contacted to place patient in the hospital for further evaluation and management.  Review of Systems: As mentioned in the history of present illness. All other systems reviewed and are negative. Past Medical History:  Diagnosis Date   Complication of anesthesia    COPD (chronic obstructive pulmonary disease) (HCC)    Coronary artery disease    Diabetes mellitus without complication (HCC)    Diverticulosis    Early cataracts, bilateral 11/21/2016   Esophageal cancer (HCC)    GERD  (gastroesophageal reflux disease)    Glaucoma    patient denies   Hyperlipidemia    Hypertension    Internal hemorrhoids    Osteoporosis 12/16/2021   Pneumonia    PONV (postoperative nausea and vomiting)    Sleep apnea    uses Cpap   Spasm of the cricopharyngeus muscle    Squamous cell esophageal cancer (HCC) 05/10/2018   Past Surgical History:  Procedure Laterality Date   ABDOMINAL HYSTERECTOMY     cervical dysplasia   BIOPSY  05/03/2018   Procedure: BIOPSY;  Surgeon: Lemar Lofty., MD;  Location: [facility] ENDOSCOPY;  Service: Gastroenterology;;   BREAST BIOPSY Left    BREAST BIOPSY Right 11/27/2021   MM RT BREAST BX W LOC DEV 1ST LESION IMAGE BX SPEC STEREO GUIDE 11/27/2021 [facility]   BREAST BIOPSY  01/14/2022   MM RT RADIOACTIVE SEED LOC MAMMO GUIDE 01/14/2022 [facility]   BREAST LUMPECTOMY Left    BREAST LUMPECTOMY WITH RADIOACTIVE SEED LOCALIZATION Right 01/15/2022   Procedure: RIGHT BREAST LUMPECTOMY WITH RADIOACTIVE SEED LOCALIZATION;  Surgeon: Harriette Bouillon, MD;  Location: [facility] OR;  Service: General;  Laterality: Right;   ESOPHAGOGASTRODUODENOSCOPY (EGD) WITH PROPOFOL N/A 05/03/2018   Procedure: ESOPHAGOGASTRODUODENOSCOPY (EGD) WITH PROPOFOL;  Surgeon: Lemar Lofty., MD;  Location: [facility] ENDOSCOPY;  Service: Gastroenterology;  Laterality: N/A;   ESOPHAGOGASTRODUODENOSCOPY (EGD) WITH PROPOFOL N/A 11/18/2018   Procedure: ESOPHAGOGASTRODUODENOSCOPY (EGD) WITH PROPOFOL;  Surgeon: Hilarie Fredrickson, MD;  Location: [facility] ENDOSCOPY;  Service: Endoscopy;  Laterality: N/A;   EUS  05/03/2018   Procedure: UPPER ENDOSCOPIC ULTRASOUND (EUS) RADIAL;  Surgeon: Meridee Score Netty Starring., MD;  Location: [facility] ENDOSCOPY;  Service: Gastroenterology;;   FOREIGN BODY REMOVAL  11/18/2018   Procedure: FOREIGN  BODY REMOVAL;  Surgeon: Hilarie Fredrickson, MD;  Location: [facility] ENDOSCOPY;  Service: Endoscopy;;   LEFT HEART CATH AND CORONARY ANGIOGRAPHY N/A 06/11/2017   Procedure: LEFT HEART CATH AND  CORONARY ANGIOGRAPHY;  Surgeon: Lyn Records, MD;  Location: [facility] INVASIVE CV LAB;  Service: Cardiovascular;  Laterality: N/A;   Social History:  reports that she quit smoking about 15 years ago. Her smoking use included cigarettes. She started smoking about 56 years ago. She has a 61.5 pack-year smoking history. She has never used smokeless tobacco. She reports current alcohol use. She reports that she does not use drugs.  No Known Allergies  Family History  Problem Relation Age of Onset   Breast cancer Mother        dx 35s; mets   Arthritis Mother    COPD Father 56       emphysema   Alcohol abuse Father    Diabetes Father    Breast cancer Sister        dx late 41s; mat half sister   Early death Brother        overdose   Drug abuse Brother    Alcohol abuse Paternal Aunt    COPD Paternal Aunt    Throat cancer Paternal Uncle    Liver cancer Maternal Grandmother        or other primary; d. late 80s   Diabetes Maternal Grandmother    Alcohol abuse Maternal Grandfather    Early death Maternal Grandfather    Stroke Paternal Grandmother    Heart disease Paternal Grandfather    Esophageal cancer Neg Hx    Stomach cancer Neg Hx    Colon cancer Neg Hx     Prior to Admission medications   Medication Sig Start Date End Date Taking? Authorizing Provider  albuterol (PROVENTIL) (2.5 MG/3ML) 0.083% nebulizer solution Take 3 mLs (2.5 mg total) by nebulization every 6 (six) hours as needed for wheezing or shortness of breath. 08/05/21  Yes Hawks, Christy A, [name]  aspirin EC 81 MG tablet Take 81 mg by mouth in the morning.   Yes [provider]  calcium carbonate (OSCAL) 1500 (600 Ca) MG TABS tablet Take 600 mg by mouth in the morning and at bedtime.   Yes [provider]  Cholecalciferol (VITAMIN D-3 PO) Take 5,000 Units by mouth in the morning.   Yes [provider]  empagliflozin (JARDIANCE) 10 MG TABS tablet Take 1 tablet (10 mg total) by mouth daily before  breakfast. 02/10/23  Yes Hawks, Christy A, [name]  famotidine (PEPCID) 40 MG tablet Take 1 tablet (40 mg total) by mouth at bedtime. 01/01/23  Yes Hawks, Christy A, [name]  fluticasone-salmeterol (ADVAIR) 250-50 MCG/ACT AEPB Inhale 1 puff into the lungs every 12 (twelve) hours. 03/10/22  Yes Coralyn Helling, MD  glimepiride (AMARYL) 2 MG tablet Take 1 tablet (2 mg total) by mouth daily as needed (blood sugar more than 200.). 01/01/23  Yes Hawks, Christy A, [name]  isosorbide mononitrate (IMDUR) 30 MG 24 hr tablet Take 1 tablet (30 mg total) by mouth daily. Patient taking differently: Take 15 mg by mouth daily. 09/03/22  Yes Wendall Stade, MD  metFORMIN (GLUCOPHAGE) 1000 MG tablet Take 1 tablet (1,000 mg total) by mouth 2 (two) times daily with a meal. 01/01/23  Yes Hawks, Christy A, [name]  Multiple Vitamins-Minerals (ADULT ONE DAILY GUMMIES PO) Take 2 tablets by mouth in the morning. Women's Multi by [employer]   Yes [provider]  nitroGLYCERIN (  NITROSTAT) 0.4 MG SL tablet Place 1 tablet (0.4 mg total) under the tongue every 5 (five) minutes as needed for chest pain. 07/04/22 07/04/23 Yes Wendall Stade, MD  omeprazole (PRILOSEC) 40 MG capsule Take 1 capsule (40 mg total) by mouth daily. 01/01/23  Yes [name] A, [name]  rosuvastatin (CRESTOR) 10 MG tablet Take 1 tablet by mouth once daily 10/08/22  Yes Wendall Stade, MD    Physical Exam: Vitals:   03/24/23 1445 03/24/23 1534 03/24/23 1545 03/24/23 1656  BP: 121/87 (!) 143/67 135/70 127/60  Pulse: (!) 107 (!) 102 100 (!) 105  Resp:    (!) 22  Temp:    98.1 F (36.7 C)  TempSrc:    Oral  SpO2: 99% 100% 100% 100%   General exam: Alert, awake, oriented x 3; expressing intermittent coughing spells and short winded sensation with activity.  2 L nasal cannula in place. Respiratory system: Positive rhonchi bilaterally; no wheezing, no crackles. Cardiovascular system:RRR.  No rubs, no gallops, no JVD on exam. Gastrointestinal system: Abdomen is  nondistended, soft and nontender. No organomegaly or masses felt. Normal bowel sounds heard. Central nervous system:  No focal neurological deficits. Extremities: No cyanosis, clubbing or edema. Skin: No petechiae. Psychiatry: Judgement and insight appear normal. Mood & affect appropriate.   Data Reviewed: CBC: White blood cell 4.6, hemoglobin 10.9 and platelet count 110K Comprehensive metabolic panel: Sodium 132, potassium 3.9, chloride 95, BUN 19, creatinine 0.70, glucose 200 and GFR >60 Respiratory panel: Negative for COVID and RSV; positive influenza A. Magnesium: 2.2   Assessment and Plan: Acute respiratory failure with hypoxia (HCC) -Appears to be secondary to acute influenza infection -Continue oxygen supplementation and wean off as tolerated -Bronchodilators, steroids, Tamiflu, flutter valve and supportive care will be provided -Follow clinical response.   Influenza A -Continues to positive care -Wean off oxygen supplementation as tolerated -Continue treatment with Tamiflu, steroids, mucolytic's and bronchodilator management -Follow clinical response.  History of esophageal cancer -Continue patient follow-up with gastroenterology service -Continue PPI.  OSA on CPAP -Will continue CPAP nightly  COPD (chronic obstructive pulmonary disease) (HCC) -No active wheezing currently appreciated -Receiving treatment with steroids for acute influenza process -Will continue home bronchodilator management and follow clinical response -Mucolytic's, flutter valve and oxygen supplementation provided.  Hyperlipidemia associated with type 2 diabetes mellitus (HCC) -Continue statin.  Diabetes mellitus (HCC) -Holding oral hypoglycemic agents while inpatient -Sliding scale insulin initiated -Follow CBG fluctuation -Anticipating elevated blood sugar with the use of his steroids therapy  GERD with esophagitis -continue PPI    Advance Care Planning:   Code Status: Full Code    Consults: None  Family Communication: No family at bedside.  Severity of Illness: The appropriate patient status for this patient is OBSERVATION. Observation status is judged to be reasonable and necessary in order to provide the required intensity of service to ensure the patient's safety. The patient's presenting symptoms, physical exam findings, and initial radiographic and laboratory data in the context of their medical condition is felt to place them at decreased risk for further clinical deterioration. Furthermore, it is anticipated that the patient will be medically stable for discharge from the hospital within 2 midnights of admission.   Author: Vassie Loll, MD 03/24/2023 6:26 PM  For on call review [email or web address].

## 2023-03-24 NOTE — Assessment & Plan Note (Signed)
-  Appears to be secondary to acute influenza infection -Continue oxygen supplementation and wean off as tolerated -Bronchodilators, steroids, Tamiflu, flutter valve and supportive care will be provided -Follow clinical response.

## 2023-03-24 NOTE — Sepsis Progress Note (Signed)
 eLink is following this Code Sepsis.

## 2023-03-24 NOTE — Assessment & Plan Note (Signed)
Will continue CPAP nightly.

## 2023-03-24 NOTE — ED Provider Notes (Signed)
 [place] EMERGENCY DEPARTMENT AT [facility] Provider Note   CSN: [id number] Arrival date & time: 03/24/23  1109     History  Chief Complaint  Patient presents with   Shortness of Breath   HPI Erica Braun is a 73 y.o. female with history of diabetes, COPD, CAD, hypertension presenting for cough and shortness of breath.  Symptoms started about 4 days ago.  The cough is productive with thick yellow sputum.  Also endorses nasal congestion but no sore throat.  Shortness of breath is worse with exertion.  Denies associated chest pain.  Was seen earlier today by her PCP and was told to come here because of her oxygen saturation was 89%.  Does not have an oxygen requirement at home.  States her husband has had similar symptoms and required antibiotics.   Shortness of Breath      Home Medications Prior to Admission medications   Medication Sig Start Date End Date Taking? Authorizing Provider  albuterol (PROVENTIL) (2.5 MG/3ML) 0.083% nebulizer solution Take 3 mLs (2.5 mg total) by nebulization every 6 (six) hours as needed for wheezing or shortness of breath. 08/05/21  Yes Hawks, Christy A, [name]  aspirin EC 81 MG tablet Take 81 mg by mouth in the morning.   Yes [provider]  calcium carbonate (OSCAL) 1500 (600 Ca) MG TABS tablet Take 600 mg by mouth in the morning and at bedtime.   Yes [provider]  Cholecalciferol (VITAMIN D-3 PO) Take 5,000 Units by mouth in the morning.   Yes [provider]  empagliflozin (JARDIANCE) 10 MG TABS tablet Take 1 tablet (10 mg total) by mouth daily before breakfast. 02/10/23  Yes Hawks, Christy A, [name]  famotidine (PEPCID) 40 MG tablet Take 1 tablet (40 mg total) by mouth at bedtime. 01/01/23  Yes Hawks, Christy A, [name]  fluticasone-salmeterol (ADVAIR) 250-50 MCG/ACT AEPB Inhale 1 puff into the lungs every 12 (twelve) hours. 03/10/22  Yes Coralyn Helling, MD  glimepiride (AMARYL) 2 MG tablet Take 1 tablet (2 mg  total) by mouth daily as needed (blood sugar more than 200.). 01/01/23  Yes Hawks, Christy A, [name]  metFORMIN (GLUCOPHAGE) 1000 MG tablet Take 1 tablet (1,000 mg total) by mouth 2 (two) times daily with a meal. 01/01/23  Yes Hawks, Christy A, [name]  Multiple Vitamins-Minerals (ADULT ONE DAILY GUMMIES PO) Take 2 tablets by mouth in the morning. Women's Multi by [employer]   Yes [provider]  nitroGLYCERIN (NITROSTAT) 0.4 MG SL tablet Place 1 tablet (0.4 mg total) under the tongue every 5 (five) minutes as needed for chest pain. 07/04/22 07/04/23 Yes Wendall Stade, MD  omeprazole (PRILOSEC) 40 MG capsule Take 1 capsule (40 mg total) by mouth daily. 01/01/23  Yes Hawks, Christy A, [name]  predniSONE (DELTASONE) 10 MG tablet Take 4 tablets (40 mg total) by mouth daily for 3 days, THEN 3 tablets (30 mg total) daily for 3 days, THEN 2 tablets (20 mg total) daily for 3 days, THEN 1 tablet (10 mg total) daily for 3 days. 03/26/23 04/07/23 Yes Azucena Fallen, MD  rosuvastatin (CRESTOR) 10 MG tablet Take 1 tablet by mouth once daily 10/08/22  Yes Wendall Stade, MD  atenolol (TENORMIN) 25 MG tablet Take 1 tablet (25 mg total) by mouth 2 (two) times daily. 03/26/23   Azucena Fallen, MD  isosorbide mononitrate (IMDUR) 30 MG 24 hr tablet Take 0.5 tablets (15 mg total) by mouth daily. 03/27/23   Natale Milch,  Kristine Garbe, MD  oseltamivir (TAMIFLU) 30 MG capsule Take 1 capsule (30 mg total) by mouth 2 (two) times daily for 3 days. 03/26/23 03/29/23  Azucena Fallen, MD      Allergies    Patient has no known allergies.    Review of Systems   Review of Systems  Respiratory:  Positive for shortness of breath.     Physical Exam   Vitals:   03/26/23 0309 03/26/23 1240  BP: 127/65 (!) 103/56  Pulse: 72 93  Resp: 20 18  Temp: 98.2 F (36.8 C) 97.8 F (36.6 C)  SpO2: 99% 97%    CONSTITUTIONAL:  ill-appearing, NAD NEURO:  Alert and oriented x 3, CN 3-12 grossly intact EYES:  eyes equal and  reactive ENT/NECK:  Supple, no stridor  CARDIO:  Tachycardic and regular rhythm, appears well-perfused  PULM:  No respiratory distress, diminished breath sounds but otherwise no wheezing or crackles GI/GU:  non-distended, soft MSK/SPINE:  No gross deformities, no edema, moves all extremities  SKIN: pale, no rash, atraumatic   *Additional and/or pertinent findings included in MDM below    ED Results / Procedures / Treatments   Labs (all labs ordered are listed, but only abnormal results are displayed) Labs Reviewed  RESP PANEL BY RT-PCR (RSV, FLU A&B, COVID)  [id number] - Abnormal; Notable for the following components:      Result Value   Influenza A by PCR POSITIVE (*)    All other components within normal limits  COMPREHENSIVE METABOLIC PANEL - Abnormal; Notable for the following components:   Sodium 132 (*)    Chloride 95 (*)    Glucose, Bld 200 (*)    All other components within normal limits  CBC WITH DIFFERENTIAL/PLATELET - Abnormal; Notable for the following components:   Hemoglobin 10.9 (*)    HCT 35.0 (*)    Platelets 110 (*)    Lymphs Abs 0.3 (*)    All other components within normal limits  BASIC METABOLIC PANEL - Abnormal; Notable for the following components:   Glucose, Bld 160 (*)    BUN 24 (*)    All other components within normal limits  CBC - Abnormal; Notable for the following components:   WBC 3.9 (*)    RBC 3.70 (*)    Hemoglobin 9.7 (*)    HCT 32.2 (*)    Platelets 108 (*)    All other components within normal limits  CULTURE, BLOOD (ROUTINE X 2)  CULTURE, BLOOD (ROUTINE X 2)  LACTIC ACID, PLASMA  PROTIME-INR  APTT  MAGNESIUM  TSH    EKG None  Radiology No results found.  Procedures .Critical Care  Performed by: Gareth Eagle, PA-C Authorized by: Gareth Eagle, PA-C   Critical care provider statement:    Critical care time (minutes):  30   Critical care was necessary to treat or prevent imminent or life-threatening deterioration  of the following conditions:  Sepsis   Critical care was time spent personally by me on the following activities:  Development of treatment plan with patient or surrogate, discussions with consultants, evaluation of patient's response to treatment, examination of patient, ordering and review of laboratory studies, ordering and review of radiographic studies, ordering and performing treatments and interventions, pulse oximetry, re-evaluation of patient's condition and review of old charts     Medications Ordered in ED Medications  lactated ringers infusion (0 mLs Intravenous Hold 03/24/23 1435)  sodium chloride 0.9 % bolus 500 mL (0 mLs Intravenous Stopped  03/24/23 1325)  cefTRIAXone (ROCEPHIN) 2 g in sodium chloride 0.9 % 100 mL IVPB (0 g Intravenous Stopped 03/24/23 1300)  acetaminophen (TYLENOL) tablet 1,000 mg (1,000 mg Oral Given 03/24/23 1222)    ED Course/ Medical Decision Making/ A&P Clinical Course as of 03/27/23 0731  Tue Mar 24, 2023  1355 Trialed her off oxygen and sats dipped into the mid 80s while in the bed. [JR]  1417 Admitted to hospital service for the flu and associated hypoxia with Dr. Gwenlyn Perking.  He requested that I start her on Tamiflu. [JR]    Clinical Course User Index [JR] Gareth Eagle, PA-C                                 Medical Decision Making Amount and/or Complexity of Data Reviewed Labs: ordered. Radiology: ordered. ECG/medicine tests: ordered.  Risk OTC drugs. Prescription drug management. Decision regarding hospitalization.   Initial Impression and Ddx 73 year old ill-appearing female presenting for URI symptoms and hypoxia.  Septic appearing initially, tachycardic hypoxic and febrile.  Triggered code sepsis.  DDx includes sepsis, pneumonia, viral URI, COPD exacerbation, CHF exacerbation, PE, other. Patient PMH that increases complexity of ED encounter:  history of diabetes, COPD, CAD, hypertension  Interpretation of Diagnostics - I independent  reviewed and interpreted the labs as followed:  flu A+  - I independently visualized the following imaging with scope of interpretation limited to determining acute life threatening conditions related to emergency care: CXR, which revealed no acute process   Patient Reassessment and Ultimate Disposition/Management Workup suggestive of flu with hypoxia.  Attempted to wean patient to room air and sats dipped into the mid 80s.  Admitted to hospital service with Dr. Gwenlyn Perking.  Patient management required discussion with the following services or consulting groups:  Hospitalist Service  Complexity of Problems Addressed Acute complicated illness or Injury  Additional Data Reviewed and Analyzed Further history obtained from: Past medical history and medications listed in the EMR and Prior ED visit notes  Patient Encounter Risk Assessment Consideration of hospitalization         Final Clinical Impression(s) / ED Diagnoses Final diagnoses:  Flu  Hypoxia    Rx / DC Orders ED Discharge Orders          Ordered    isosorbide mononitrate (IMDUR) 30 MG 24 hr tablet  Daily        03/26/23 1255    oseltamivir (TAMIFLU) 30 MG capsule  2 times daily        03/26/23 1255    atenolol (TENORMIN) 25 MG tablet  2 times daily        03/26/23 1255    predniSONE (DELTASONE) 10 MG tablet  Daily        03/26/23 1255              Gareth Eagle, PA-C 03/27/23 0731    Cathren Laine, MD 03/30/23 1342

## 2023-03-24 NOTE — Assessment & Plan Note (Signed)
-  Continue patient follow-up with gastroenterology service -Continue PPI.

## 2023-03-24 NOTE — ED Notes (Addendum)
 Pt O2 dropped to 85% without nasal canula while sitting in bed prior to walking.

## 2023-03-24 NOTE — Assessment & Plan Note (Signed)
-  Continues to positive care -Wean off oxygen supplementation as tolerated -Continue treatment with Tamiflu, steroids, mucolytic's and bronchodilator management -Follow clinical response.

## 2023-03-24 NOTE — Care Management Obs Status (Signed)
 MEDICARE OBSERVATION STATUS NOTIFICATION   Patient Details  Name: Erica Braun MRN: [id number] Date of Birth: 09/26/1950   Medicare Observation Status Notification Given:  Yes    Barron Alvine, RN 03/24/2023, 8:49 PM

## 2023-03-24 NOTE — Assessment & Plan Note (Signed)
-  No active wheezing currently appreciated -Receiving treatment with steroids for acute influenza process -Will continue home bronchodilator management and follow clinical response -Mucolytic's, flutter valve and oxygen supplementation provided.

## 2023-03-24 NOTE — Telephone Encounter (Signed)
 Chief Complaint: cough, SOB Symptoms: moderate SOB, hypoxia, body aches, dry painful eyes, productive cough with yellow mucus Frequency: x 5 days Pertinent Negatives: Patient denies chest pain, sore throat. Disposition: [x] ED /[] Urgent Care (no appt availability in office) / [] Appointment(In office/virtual)/ []  [facility]/ [] Home Care/ [] Refused Recommended Disposition /[] [facility]/ []  Follow-up with PCP Additional Notes: Patient states she has had a productive cough since Friday. She states she has moderate SOB and feels SOB at this time, she mentions she has COPD and her baseline SpO2 is 99%. Patient's SpO2 at this time is 89-90% and she states she feels SOB. She states she has been using her nebulizer and doing her breathing treatments and taking Muccinex. She states she has been drinking plenty of water but feels dehydrated. Advised go to ED, patient agreeable.  Copied from CRM [id number]. Topic: Clinical - Red Word Triage >> Mar 24, 2023  9:18 AM Elle L wrote: Red Word that prompted transfer to Nurse Triage: The patient has been having respiratory issues since Friday. She has a worsening cough. She has an on and off fever. Her oxygen is going up and down 90-99. She feels dehydrated although she is drinking water. Reason for Disposition  [1] MODERATE difficulty breathing (e.g., speaks in phrases, SOB even at rest) AND [2] worse than normal  Answer Assessment - Initial Assessment Questions 1. ONSET: "When did the cough begin?"      03/20/23.  2. SEVERITY: "How bad is the cough today?"      Cough keeps her up at night, unable to sleep.  3. SPUTUM: "Describe the color of your sputum" (none, dry cough; clear, white, yellow, green)     Yellow.  4. HEMOPTYSIS: "Are you coughing up any blood?" If so ask: "How much?" (flecks, streaks, tablespoons, etc.)     Denies.  5. DIFFICULTY BREATHING: "Are you having difficulty breathing?" If Yes, ask: "How bad is it?" (e.g.,  mild, moderate, severe)    - MILD: No SOB at rest, mild SOB with walking, speaks normally in sentences, can lie down, no retractions, pulse < 100.    - MODERATE: SOB at rest, SOB with minimal exertion and prefers to sit, cannot lie down flat, speaks in phrases, mild retractions, audible wheezing, pulse 100-120.    - SEVERE: Very SOB at rest, speaks in single words, struggling to breathe, sitting hunched forward, retractions, pulse > 120      SOB after going up a flight of steps makes her lose her breath and then catches it after sitting down.  6. FEVER: "Do you have a fever?" If Yes, ask: "What is your temperature, how was it measured, and when did it start?"     Yes, highest was 101 a couple of nights ago. She felt feverish last night but did not check her temperature.  7. CARDIAC HISTORY: "Do you have any history of heart disease?" (e.g., heart attack, congestive heart failure)      Cardiac stent.  8. LUNG HISTORY: "Do you have any history of lung disease?"  (e.g., pulmonary embolus, asthma, emphysema)     COPD (states her baseline SpO2 is usually 99%). She states SpO2 89-90% at this time.  9. PE RISK FACTORS: "Do you have a history of blood clots?" (or: recent major surgery, recent prolonged travel, bedridden)     Denies.  10. OTHER SYMPTOMS: "Do you have any other symptoms?" (e.g., runny nose, wheezing, chest pain)       Dry, painful eyes,  body aches, wheezing (last night)  11. PREGNANCY: "Is there any chance you are pregnant?" "When was your last menstrual period?"       N/A.  12. TRAVEL: "Have you traveled out of the country in the last month?" (e.g., travel history, exposures)       Denies. She states her husband is sick too with similar symptoms.  Protocols used: Cough - Acute Productive-A-AH, COPD Oxygen Monitoring and Hypoxia-A-AH

## 2023-03-25 DIAGNOSIS — E1169 Type 2 diabetes mellitus with other specified complication: Secondary | ICD-10-CM | POA: Diagnosis not present

## 2023-03-25 DIAGNOSIS — R0902 Hypoxemia: Secondary | ICD-10-CM | POA: Diagnosis not present

## 2023-03-25 DIAGNOSIS — Z8501 Personal history of malignant neoplasm of esophagus: Secondary | ICD-10-CM | POA: Diagnosis not present

## 2023-03-25 DIAGNOSIS — Z833 Family history of diabetes mellitus: Secondary | ICD-10-CM | POA: Diagnosis not present

## 2023-03-25 DIAGNOSIS — G4733 Obstructive sleep apnea (adult) (pediatric): Secondary | ICD-10-CM | POA: Diagnosis not present

## 2023-03-25 DIAGNOSIS — Z8701 Personal history of pneumonia (recurrent): Secondary | ICD-10-CM | POA: Diagnosis not present

## 2023-03-25 DIAGNOSIS — Z7951 Long term (current) use of inhaled steroids: Secondary | ICD-10-CM | POA: Diagnosis not present

## 2023-03-25 DIAGNOSIS — K2100 Gastro-esophageal reflux disease with esophagitis, without bleeding: Secondary | ICD-10-CM | POA: Diagnosis not present

## 2023-03-25 DIAGNOSIS — Z87891 Personal history of nicotine dependence: Secondary | ICD-10-CM | POA: Diagnosis not present

## 2023-03-25 DIAGNOSIS — Z7982 Long term (current) use of aspirin: Secondary | ICD-10-CM | POA: Diagnosis not present

## 2023-03-25 DIAGNOSIS — J101 Influenza due to other identified influenza virus with other respiratory manifestations: Secondary | ICD-10-CM | POA: Diagnosis not present

## 2023-03-25 DIAGNOSIS — J111 Influenza due to unidentified influenza virus with other respiratory manifestations: Secondary | ICD-10-CM | POA: Diagnosis not present

## 2023-03-25 DIAGNOSIS — J449 Chronic obstructive pulmonary disease, unspecified: Secondary | ICD-10-CM | POA: Diagnosis not present

## 2023-03-25 DIAGNOSIS — M810 Age-related osteoporosis without current pathological fracture: Secondary | ICD-10-CM | POA: Diagnosis not present

## 2023-03-25 DIAGNOSIS — J9601 Acute respiratory failure with hypoxia: Secondary | ICD-10-CM | POA: Diagnosis not present

## 2023-03-25 DIAGNOSIS — Z8249 Family history of ischemic heart disease and other diseases of the circulatory system: Secondary | ICD-10-CM | POA: Diagnosis not present

## 2023-03-25 DIAGNOSIS — Z79899 Other long term (current) drug therapy: Secondary | ICD-10-CM | POA: Diagnosis not present

## 2023-03-25 DIAGNOSIS — R911 Solitary pulmonary nodule: Secondary | ICD-10-CM | POA: Diagnosis not present

## 2023-03-25 DIAGNOSIS — Z7984 Long term (current) use of oral hypoglycemic drugs: Secondary | ICD-10-CM | POA: Diagnosis not present

## 2023-03-25 DIAGNOSIS — I10 Essential (primary) hypertension: Secondary | ICD-10-CM | POA: Diagnosis not present

## 2023-03-25 DIAGNOSIS — Z825 Family history of asthma and other chronic lower respiratory diseases: Secondary | ICD-10-CM | POA: Diagnosis not present

## 2023-03-25 DIAGNOSIS — Z800 Family history of malignant neoplasm of digestive organs: Secondary | ICD-10-CM | POA: Diagnosis not present

## 2023-03-25 DIAGNOSIS — Z1152 Encounter for screening for COVID-19: Secondary | ICD-10-CM | POA: Diagnosis not present

## 2023-03-25 DIAGNOSIS — J189 Pneumonia, unspecified organism: Secondary | ICD-10-CM | POA: Diagnosis not present

## 2023-03-25 DIAGNOSIS — E785 Hyperlipidemia, unspecified: Secondary | ICD-10-CM | POA: Diagnosis not present

## 2023-03-25 DIAGNOSIS — Z823 Family history of stroke: Secondary | ICD-10-CM | POA: Diagnosis not present

## 2023-03-25 DIAGNOSIS — I2510 Atherosclerotic heart disease of native coronary artery without angina pectoris: Secondary | ICD-10-CM | POA: Diagnosis not present

## 2023-03-25 LAB — BASIC METABOLIC PANEL
Anion gap: 10 (ref 5–15)
BUN: 24 mg/dL — ABNORMAL HIGH (ref 8–23)
CO2: 26 mmol/L (ref 22–32)
Calcium: 8.9 mg/dL (ref 8.9–10.3)
Chloride: 101 mmol/L (ref 98–111)
Creatinine, Ser: 0.62 mg/dL (ref 0.44–1.00)
GFR, Estimated: >60 mL/min (ref >60)
Glucose, Bld: 160 mg/dL — ABNORMAL HIGH (ref 70–99)
Potassium: 4 mmol/L (ref 3.5–5.1)
Sodium: 137 mmol/L (ref 135–145)

## 2023-03-25 LAB — CBC
HCT: 32.2 % — ABNORMAL LOW (ref 36.0–46.0)
Hemoglobin: 9.7 g/dL — ABNORMAL LOW (ref 12.0–15.0)
MCH: 26.2 pg (ref 26.0–34.0)
MCHC: 30.1 g/dL (ref 30.0–36.0)
MCV: 87 fL (ref 80.0–100.0)
Platelets: 108 10*3/uL — ABNORMAL LOW (ref 150–400)
RBC: 3.7 MIL/uL — ABNORMAL LOW (ref 3.87–5.11)
RDW: 15.1 % (ref 11.5–15.5)
WBC: 3.9 10*3/uL — ABNORMAL LOW (ref 4.0–10.5)
nRBC: 0 % (ref 0.0–0.2)

## 2023-03-25 MED ORDER — ORAL CARE MOUTH RINSE: 15.0000 mL | OROMUCOSAL | Status: DC | PRN

## 2023-03-25 MED ORDER — IPRATROPIUM-ALBUTEROL 0.5-2.5 (3) MG/3ML IN SOLN
3.0000 mL | Freq: Two times a day (BID) | RESPIRATORY_TRACT | Status: DC
  Administered 2023-03-26: 3 mL via RESPIRATORY_TRACT
  Filled 2023-03-25: qty 3

## 2023-03-25 NOTE — Plan of Care (Signed)
  Problem: Education: Goal: Knowledge of General Education information will improve Description: Including pain rating scale, medication(s)/side effects and non-pharmacologic comfort measures Outcome: Progressing   Problem: Health Behavior/Discharge Planning: Goal: Ability to manage health-related needs will improve Outcome: Progressing   Problem: Clinical Measurements: Goal: Respiratory complications will improve Outcome: Progressing   

## 2023-03-25 NOTE — Progress Notes (Signed)
 PROGRESS NOTE    Erica Braun  GUY:[id number] DOB: Apr 12, 1950 DOA: 03/24/2023 PCP: Junie Spencer, [name]   Brief Narrative:  Erica Braun is a 73 y.o. female with medical history significant of COPD, obstructive sleep apnea (chronically on CPAP), hypertension, hyperlipidemia, gastroesophageal reflux disease, prior history of squamous cell esophageal cancer and type 2 diabetes; who presented to the hospital secondary to increased shortness of breath, fever, general malaise and intermittent coughing spells.    Assessment & Plan:   Principal Problem:   Acute respiratory failure with hypoxia (HCC) Active Problems:   Influenza A   Diabetes mellitus (HCC)   GERD with esophagitis   Hyperlipidemia associated with type 2 diabetes mellitus (HCC)   COPD (chronic obstructive pulmonary disease) (HCC)   OSA on CPAP   History of esophageal cancer   Acute respiratory failure with hypoxia (HCC) -Appears to be secondary to acute influenza infection -Continues to be profoundly symptomatic and hypoxic with ambulation screen -Continue oxygen supplementation and wean off as tolerated -Bronchodilators, steroids, Tamiflu, flutter valve and supportive care will be provided -Follow clinically   Influenza A, improving -Continue tamiflu/supportive care -Wean off oxygen supplementation as tolerated  History of esophageal cancer -Stable: Continue patient follow-up with gastroenterology service -Continue PPI.   OSA on CPAP -Will continue CPAP nightly   COPD (chronic obstructive pulmonary disease) (HCC) -No active wheezing currently appreciated -Receiving treatment with steroids for acute influenza process -Will continue home bronchodilator management and follow clinical response -Mucolytic's, flutter valve and oxygen supplementation provided.   Hyperlipidemia associated with type 2 diabetes mellitus (HCC) -Continue statin.   Diabetes mellitus (HCC) -Holding oral  hypoglycemic agents while inpatient -Sliding scale insulin initiated -Follow CBG fluctuation -Anticipating elevated blood sugar with the use of his steroids therapy   GERD with esophagitis -continue PPI  DVT prophylaxis: enoxaparin (LOVENOX) injection 40 mg Start: 03/24/23 2200   Code Status:   Code Status: Full Code  Family Communication: None  Status is: Inpt  Dispo: The patient is from: Home              Anticipated d/c is to: Home              Anticipated d/c date is: 24-48h              Patient currently not medically stable for discharge  Consultants:  None  Procedures:  None  Antimicrobials:  Tamiflu   Subjective: No acute issues/events overnight - respiratory status improving but not yet back to baseline  Objective: Vitals:   03/24/23 1656 03/24/23 1909 03/24/23 2003 03/25/23 0338  BP: 127/60 108/63  117/62  Pulse: (!) 105 (!) 104  85  Resp: (!) 22 20  20   Temp: 98.1 F (36.7 C) 98.2 F (36.8 C)  98.2 F (36.8 C)  TempSrc: Oral Oral  Oral  SpO2: 100% 99% 95% 99%   No intake or output data in the 24 hours ending 03/25/23 0735 There were no vitals filed for this visit.  Examination:  General:  Pleasantly resting in bed, No acute distress. HEENT:  Normocephalic atraumatic.  Sclerae nonicteric, noninjected.  Extraocular movements intact bilaterally. Neck:  Without mass or deformity.  Trachea is midline. Lungs: Diffuse expiratory wheeze with bibasilar rhonchi. Heart:  Regular rate and rhythm.  Without murmurs, rubs, or gallops. Abdomen:  Soft, nontender, nondistended.  Without guarding or rebound. Extremities: Without cyanosis, clubbing, edema, or obvious deformity. Skin:  Warm and dry, no erythema.  Data Reviewed: I have personally  reviewed following labs and imaging studies  CBC: Recent Labs  Lab 03/24/23 1218 03/25/23 0411  WBC 4.6 3.9*  NEUTROABS 3.9  --   HGB 10.9* 9.7*  HCT 35.0* 32.2*  MCV 85.4 87.0  PLT 110* 108*   Basic Metabolic  Panel: Recent Labs  Lab 03/24/23 1218 03/25/23 0411  NA 132* 137  K 3.9 4.0  CL 95* 101  CO2 26 26  GLUCOSE 200* 160*  BUN 19 24*  CREATININE 0.70 0.62  CALCIUM 9.1 8.9  MG 2.2  --    GFR: Estimated Creatinine Clearance: 51.5 mL/min (by C-G formula based on SCr of 0.62 mg/dL).  Liver Function Tests: Recent Labs  Lab 03/24/23 1218  AST 15  ALT 14  ALKPHOS 76  BILITOT 0.5  PROT 7.5  ALBUMIN 3.6    Coagulation Profile: Recent Labs  Lab 03/24/23 1218  INR 1.0   Thyroid Function Tests: Recent Labs    03/24/23 1218  TSH 1.047   Sepsis Labs: Recent Labs  Lab 03/24/23 1218  LATICACIDVEN 1.0    Recent Results (from the past 240 hours)  Resp panel by RT-PCR (RSV, Flu A&B, Covid) Anterior Nasal Swab     Status: Abnormal   Collection Time: 03/24/23 12:18 PM   Specimen: Anterior Nasal Swab  Result Value Ref Range Status   SARS Coronavirus 2 by RT PCR NEGATIVE NEGATIVE Final    Comment: (NOTE) SARS-CoV-2 target nucleic acids are NOT DETECTED.  The SARS-CoV-2 RNA is generally detectable in upper respiratory specimens during the acute phase of infection. The lowest concentration of SARS-CoV-2 viral copies this assay can detect is 138 copies/mL. A negative result does not preclude SARS-Cov-2 infection and should not be used as the sole basis for treatment or other patient management decisions. A negative result may occur with  improper specimen collection/handling, submission of specimen other than nasopharyngeal swab, presence of viral mutation(s) within the areas targeted by this assay, and inadequate number of viral copies(<138 copies/mL). A negative result must be combined with clinical observations, patient history, and epidemiological information. The expected result is Negative.  Fact Sheet for Patients:  BloggerCourse.com  Fact Sheet for Healthcare Providers:  SeriousBroker.it  This test is no t yet  approved or cleared by the [place] FDA and  has been authorized for detection and/or diagnosis of SARS-CoV-2 by FDA under an Emergency Use Authorization (EUA). This EUA will remain  in effect (meaning this test can be used) for the duration of the COVID-19 declaration under Section 564(b)(1) of the Act, 21 U.S.C.section 360bbb-3(b)(1), unless the authorization is terminated  or revoked sooner.       Influenza A by PCR POSITIVE (A) NEGATIVE Final   Influenza B by PCR NEGATIVE NEGATIVE Final    Comment: (NOTE) The Xpert Xpress SARS-CoV-2/FLU/RSV plus assay is intended as an aid in the diagnosis of influenza from Nasopharyngeal swab specimens and should not be used as a sole basis for treatment. Nasal washings and aspirates are unacceptable for Xpert Xpress SARS-CoV-2/FLU/RSV testing.  Fact Sheet for Patients: BloggerCourse.com  Fact Sheet for Healthcare Providers: SeriousBroker.it  This test is not yet approved or cleared by the [place] FDA and has been authorized for detection and/or diagnosis of SARS-CoV-2 by FDA under an Emergency Use Authorization (EUA). This EUA will remain in effect (meaning this test can be used) for the duration of the COVID-19 declaration under Section 564(b)(1) of the Act, 21 U.S.C. section 360bbb-3(b)(1), unless the authorization is terminated or revoked.  Resp Syncytial Virus by PCR NEGATIVE NEGATIVE Final    Comment: (NOTE) Fact Sheet for Patients: BloggerCourse.com  Fact Sheet for Healthcare Providers: SeriousBroker.it  This test is not yet approved or cleared by the [place] FDA and has been authorized for detection and/or diagnosis of SARS-CoV-2 by FDA under an Emergency Use Authorization (EUA). This EUA will remain in effect (meaning this test can be used) for the duration of the COVID-19 declaration under Section 564(b)(1)  of the Act, 21 U.S.C. section 360bbb-3(b)(1), unless the authorization is terminated or revoked.  Performed at [facility], [address] [place], [place] [address]          Radiology Studies: [facility] Chest [facility] 1 View Result Date: 03/24/2023 CLINICAL DATA:  Sepsis. History of squamous cell carcinoma of the esophagus. EXAM: PORTABLE CHEST 1 VIEW COMPARISON:  X-ray 11/19/2020 and older.  PET-CT 01/16/2023. FINDINGS: No consolidation, pneumothorax or effusion. Normal cardiopericardial silhouette without edema. Slight curvature of the spine. Dense right midlung nodule identified consistent with likely calcified and old granulomatous disease. Please correlate with recent cross-sectional imaging study. IMPRESSION: No acute cardiopulmonary disease. Electronically Signed   By: Karen Kays M.D.   On: 03/24/2023 13:58        Scheduled Meds:  aspirin EC  81 mg Oral q AM   atenolol  25 mg Oral BID   budesonide (PULMICORT) nebulizer solution  0.5 mg Nebulization BID   dextromethorphan-guaiFENesin  1 tablet Oral BID   enoxaparin (LOVENOX) injection  40 mg Subcutaneous Q24H   famotidine  40 mg Oral QHS   isosorbide mononitrate  15 mg Oral Daily   oseltamivir  30 mg Oral BID   pantoprazole  40 mg Oral Daily   predniSONE  40 mg Oral Q breakfast   rosuvastatin  10 mg Oral Daily   Continuous Infusions:  lactated ringers Stopped (03/24/23 1435)     LOS: 0 days   Time spent:  Azucena Fallen, DO [facility]  If 7PM-7AM, please contact night-coverage [email or web address]  03/25/2023, 7:35 AM

## 2023-03-26 ENCOUNTER — Other Ambulatory Visit: Payer: Self-pay

## 2023-03-26 DIAGNOSIS — J9601 Acute respiratory failure with hypoxia: Secondary | ICD-10-CM | POA: Diagnosis not present

## 2023-03-26 MED ORDER — ATENOLOL 25 MG PO TABS: 25.0000 mg | ORAL_TABLET | Freq: Two times a day (BID) | ORAL | 0 refills | Status: DC

## 2023-03-26 MED ORDER — PREDNISONE 10 MG PO TABS: ORAL_TABLET | ORAL | 0 refills | Status: AC

## 2023-03-26 MED ORDER — ISOSORBIDE MONONITRATE ER 30 MG PO TB24: 15.0000 mg | ORAL_TABLET | Freq: Every day | ORAL | 0 refills | Status: DC

## 2023-03-26 MED ORDER — OSELTAMIVIR PHOSPHATE 30 MG PO CAPS: 30.0000 mg | ORAL_CAPSULE | Freq: Two times a day (BID) | ORAL | 0 refills | Status: DC

## 2023-03-26 NOTE — Discharge Summary (Signed)
 Physician Discharge Summary  Erica Braun JXB:[id number] DOB: [birth year] DOA: 03/24/2023  PCP: Junie Spencer, [name]  Admit date: 03/24/2023 Discharge date: 03/26/2023  Admitted From: Home Disposition: Home  Recommendations for Outpatient Follow-up:  Follow up with PCP in 1-2 weeks Follow-up with pulmonology in 1 to 2 weeks as scheduled  Home Health: None Equipment/Devices: None  Discharge Condition: Stable CODE STATUS: Full Diet recommendation: Low-salt low-fat low-carb diet  Brief/Interim Summary: Erica Braun is a 73 y.o. female with medical history significant of COPD, obstructive sleep apnea (chronically on CPAP), hypertension, hyperlipidemia, gastroesophageal reflux disease, prior history of squamous cell esophageal cancer and type 2 diabetes; who presented to the hospital secondary to increased shortness of breath, fever, general malaise and intermittent coughing spells.  Patient admitted as above with acute hypoxic respiratory failure in the setting of influenza A infection and pneumonia.  Patient has now been able to wean back to room air, ambulating without any further symptomatology.  Will discharge patient home on remainder of Tamiflu and recommend ongoing treatment.  Otherwise follow-up with PCP/pulmonology in the next weeks for follow-up pneumonia and to further evaluate COPD baseline.  Discharge Diagnoses:  Principal Problem:   Acute respiratory failure with hypoxia (HCC) Active Problems:   Influenza A   Diabetes mellitus (HCC)   GERD with esophagitis   Hyperlipidemia associated with type 2 diabetes mellitus (HCC)   COPD (chronic obstructive pulmonary disease) (HCC)   OSA on CPAP   History of esophageal cancer    Discharge Instructions   Allergies as of 03/26/2023   No Known Allergies      Medication List     TAKE these medications    ADULT ONE DAILY GUMMIES PO Take 2 tablets by mouth in the morning. Women's Multi by V[employer]usion    albuterol (2.5 MG/3ML) 0.083% nebulizer solution Commonly known as: PROVENTIL Take 3 mLs (2.5 mg total) by nebulization every 6 (six) hours as needed for wheezing or shortness of breath.   aspirin EC 81 MG tablet Take 81 mg by mouth in the morning.   atenolol 25 MG tablet Commonly known as: TENORMIN Take 1 tablet (25 mg total) by mouth 2 (two) times daily.   calcium carbonate 1500 (600 Ca) MG Tabs tablet Commonly known as: OSCAL Take 600 mg by mouth in the morning and at bedtime.   empagliflozin 10 MG Tabs tablet Commonly known as: Jardiance Take 1 tablet (10 mg total) by mouth daily before breakfast.   famotidine 40 MG tablet Commonly known as: PEPCID Take 1 tablet (40 mg total) by mouth at bedtime.   fluticasone-salmeterol 250-50 MCG/ACT Aepb Commonly known as: ADVAIR Inhale 1 puff into the lungs every 12 (twelve) hours.   glimepiride 2 MG tablet Commonly known as: AMARYL Take 1 tablet (2 mg total) by mouth daily as needed (blood sugar more than 200.).   isosorbide mononitrate 30 MG 24 hr tablet Commonly known as: IMDUR Take 0.5 tablets (15 mg total) by mouth daily. Start taking on: March 27, 2023   metFORMIN 1000 MG tablet Commonly known as: GLUCOPHAGE Take 1 tablet (1,000 mg total) by mouth 2 (two) times daily with a meal.   nitroGLYCERIN 0.4 MG SL tablet Commonly known as: Nitrostat Place 1 tablet (0.4 mg total) under the tongue every 5 (five) minutes as needed for chest pain.   omeprazole 40 MG capsule Commonly known as: PRILOSEC Take 1 capsule (40 mg total) by mouth daily.   oseltamivir 30 MG capsule Commonly known as: TAMIFLU  Take 1 capsule (30 mg total) by mouth 2 (two) times daily for 3 days.   predniSONE 10 MG tablet Commonly known as: DELTASONE Take 4 tablets (40 mg total) by mouth daily for 3 days, THEN 3 tablets (30 mg total) daily for 3 days, THEN 2 tablets (20 mg total) daily for 3 days, THEN 1 tablet (10 mg total) daily for 3 days. Start  taking on: March 26, 2023   rosuvastatin 10 MG tablet Commonly known as: CRESTOR Take 1 tablet by mouth once daily   VITAMIN D-3 PO Take 5,000 Units by mouth in the morning.        No Known Allergies  Consultations: None   Procedures/Studies: DG Chest Port 1 View Result Date: 03/24/2023 CLINICAL DATA:  Sepsis. History of squamous cell carcinoma of the esophagus. EXAM: PORTABLE CHEST 1 VIEW COMPARISON:  X-ray 11/19/2020 and older.  PET-CT 01/16/2023. FINDINGS: No consolidation, pneumothorax or effusion. Normal cardiopericardial silhouette without edema. Slight curvature of the spine. Dense right midlung nodule identified consistent with likely calcified and old granulomatous disease. Please correlate with recent cross-sectional imaging study. IMPRESSION: No acute cardiopulmonary disease. Electronically Signed   By: Karen Kays M.D.   On: 03/24/2023 13:58     Subjective: No acute issues or events overnight, respiratory status back to baseline denies further symptoms of dyspnea or cough with exertion   Discharge Exam: Vitals:   03/26/23 0309 03/26/23 1240  BP: 127/65 (!) 103/56  Pulse: 72 93  Resp: 20 18  Temp: 98.2 F (36.8 C) 97.8 F (36.6 C)  SpO2: 99% 97%   Vitals:   03/25/23 1948 03/26/23 0309 03/26/23 0715 03/26/23 1240  BP: 106/66 127/65  (!) 103/56  Pulse: 72 72  93  Resp: 20 20  18   Temp: 98.2 F (36.8 C) 98.2 F (36.8 C)  97.8 F (36.6 C)  TempSrc: Oral Oral  Oral  SpO2: 100% 99%  97%  Weight:   62.1 kg   Height:   5' (1.524 m)     General: Pt is alert, awake, not in acute distress Cardiovascular: RRR, S1/S2 +, no rubs, no gallops Respiratory: Diminished bilaterally, without wheeze rales or rhonchi Abdominal: Soft, NT, ND, bowel sounds + Extremities: no edema, no cyanosis    The results of significant diagnostics from this hospitalization (including imaging, microbiology, ancillary and laboratory) are listed below for reference.      Microbiology: Recent Results (from the past 240 hours)  Resp panel by RT-PCR (RSV, Flu A&B, Covid) Anterior Nasal Swab     Status: Abnormal   Collection Time: 03/24/23 12:18 PM   Specimen: Anterior Nasal Swab  Result Value Ref Range Status   SARS Coronavirus 2 by RT PCR NEGATIVE NEGATIVE Final    Comment: (NOTE) SARS-CoV-2 target nucleic acids are NOT DETECTED.  The SARS-CoV-2 RNA is generally detectable in upper respiratory specimens during the acute phase of infection. The lowest concentration of SARS-CoV-2 viral copies this assay can detect is 138 copies/mL. A negative result does not preclude SARS-Cov-2 infection and should not be used as the sole basis for treatment or other patient management decisions. A negative result may occur with  improper specimen collection/handling, submission of specimen other than nasopharyngeal swab, presence of viral mutation(s) within the areas targeted by this assay, and inadequate number of viral copies(<138 copies/mL). A negative result must be combined with clinical observations, patient history, and epidemiological information. The expected result is Negative.  Fact Sheet for Patients:  BloggerCourse.com  Fact Sheet  for Healthcare Providers:  SeriousBroker.it  This test is no t yet approved or cleared by the [place] and  has been authorized for detection and/or diagnosis of SARS-CoV-2 by FDA under an Emergency Use Authorization (EUA). This EUA will remain  in effect (meaning this test can be used) for the duration of the COVID-19 declaration under Section 564(b)(1) of the Act, 21 U.S.C.section 360bbb-3(b)(1), unless the authorization is terminated  or revoked sooner.       Influenza A by PCR POSITIVE (A) NEGATIVE Final   Influenza B by PCR NEGATIVE NEGATIVE Final    Comment: (NOTE) The Xpert Xpress SARS-CoV-2/FLU/RSV plus assay is intended as an aid in the diagnosis of  influenza from Nasopharyngeal swab specimens and should not be used as a sole basis for treatment. Nasal washings and aspirates are unacceptable for Xpert Xpress SARS-CoV-2/FLU/RSV testing.  Fact Sheet for Patients: BloggerCourse.com  Fact Sheet for Healthcare Providers: SeriousBroker.it  This test is not yet approved or cleared by the [place] FDA and has been authorized for detection and/or diagnosis of SARS-CoV-2 by FDA under an Emergency Use Authorization (EUA). This EUA will remain in effect (meaning this test can be used) for the duration of the COVID-19 declaration under Section 564(b)(1) of the Act, 21 U.S.C. section 360bbb-3(b)(1), unless the authorization is terminated or revoked.     Resp Syncytial Virus by PCR NEGATIVE NEGATIVE Final    Comment: (NOTE) Fact Sheet for Patients: BloggerCourse.com  Fact Sheet for Healthcare Providers: SeriousBroker.it  This test is not yet approved or cleared by the [place] FDA and has been authorized for detection and/or diagnosis of SARS-CoV-2 by FDA under an Emergency Use Authorization (EUA). This EUA will remain in effect (meaning this test can be used) for the duration of the COVID-19 declaration under Section 564(b)(1) of the Act, 21 U.S.C. section 360bbb-3(b)(1), unless the authorization is terminated or revoked.  Performed at [facility], [address], [place], [place] [address]   Blood Culture (routine x 2)     Status: None (Preliminary result)   Collection Time: 03/24/23 12:18 PM   Specimen: BLOOD  Result Value Ref Range Status   Specimen Description BLOOD BLOOD LEFT ARM  Final   Special Requests   Final    Blood Culture results may not be optimal due to an inadequate volume of blood received in culture bottles BOTTLES DRAWN AEROBIC AND ANAEROBIC   Culture   Final    NO GROWTH 2 DAYS Performed at [facility], [address], [place], [place] [address]    Report Status PENDING  Incomplete  Blood Culture (routine x 2)     Status: None (Preliminary result)   Collection Time: 03/24/23 12:18 PM   Specimen: BLOOD  Result Value Ref Range Status   Specimen Description BLOOD rfoa  Final   Special Requests   Final    BOTTLES DRAWN AEROBIC AND ANAEROBIC Blood Culture results may not be optimal due to an inadequate volume of blood received in culture bottles   Culture   Final    NO GROWTH 2 DAYS Performed at [facility], [address] [place], [place] [address]    Report Status PENDING  Incomplete     Labs: BNP (last 3 results) No results for input(s): "BNP" in the last 8760 hours. Basic Metabolic Panel: Recent Labs  Lab 03/24/23 1218 03/25/23 0411  NA 132* 137  K 3.9 4.0  CL 95* 101  CO2 26 26  GLUCOSE 200* 160*  BUN 19 24*  CREATININE 0.70 0.62  CALCIUM 9.1 8.9  MG 2.2  --    Liver Function Tests: Recent Labs  Lab 03/24/23 1218  AST 15  ALT 14  ALKPHOS 76  BILITOT 0.5  PROT 7.5  ALBUMIN 3.6   No results for input(s): "LIPASE", "AMYLASE" in the last 168 hours. No results for input(s): "AMMONIA" in the last 168 hours. CBC: Recent Labs  Lab 03/24/23 1218 03/25/23 0411  WBC 4.6 3.9*  NEUTROABS 3.9  --   HGB 10.9* 9.7*  HCT 35.0* 32.2*  MCV 85.4 87.0  PLT 110* 108*   Cardiac Enzymes: No results for input(s): "CKTOTAL", "CKMB", "CKMBINDEX", "TROPONINI" in the last 168 hours. BNP: Invalid input(s): "POCBNP" CBG: No results for input(s): "GLUCAP" in the last 168 hours. D-Dimer No results for input(s): "DDIMER" in the last 72 hours. Hgb A1c No results for input(s): "HGBA1C" in the last 72 hours. Lipid Profile No results for input(s): "CHOL", "HDL", "LDLCALC", "TRIG", "CHOLHDL", "LDLDIRECT" in the last 72 hours. Thyroid function studies Recent Labs    03/24/23 1218  TSH 1.047   Anemia work up No results for input(s): "VITAMINB12", "FOLATE", "FERRITIN",  "TIBC", "IRON", "RETICCTPCT" in the last 72 hours. Urinalysis    Component Value Date/Time   COLORURINE YELLOW 11/21/2016 1012   APPEARANCEUR Clear 03/28/2022 1413   LABSPEC 1.015 08/31/2017 1124   PHURINE 7.0 08/31/2017 1124   GLUCOSEU 3+ (A) 03/28/2022 1413   HGBUR NEGATIVE 08/31/2017 1124   BILIRUBINUR Negative 03/28/2022 1413   KETONESUR NEGATIVE 08/31/2017 1124   PROTEINUR Negative 03/28/2022 1413   PROTEINUR NEGATIVE 08/31/2017 1124   UROBILINOGEN 1.0 08/31/2017 1124   NITRITE Negative 03/28/2022 1413   NITRITE NEGATIVE 08/31/2017 1124   LEUKOCYTESUR Trace (A) 03/28/2022 1413   Sepsis Labs Recent Labs  Lab 03/24/23 1218 03/25/23 0411  WBC 4.6 3.9*   Microbiology Recent Results (from the past 240 hours)  Resp panel by RT-PCR (RSV, Flu A&B, Covid) Anterior Nasal Swab     Status: Abnormal   Collection Time: 03/24/23 12:18 PM   Specimen: Anterior Nasal Swab  Result Value Ref Range Status   SARS Coronavirus 2 by RT PCR NEGATIVE NEGATIVE Final    Comment: (NOTE) SARS-CoV-2 target nucleic acids are NOT DETECTED.  The SARS-CoV-2 RNA is generally detectable in upper respiratory specimens during the acute phase of infection. The lowest concentration of SARS-CoV-2 viral copies this assay can detect is 138 copies/mL. A negative result does not preclude SARS-Cov-2 infection and should not be used as the sole basis for treatment or other patient management decisions. A negative result may occur with  improper specimen collection/handling, submission of specimen other than nasopharyngeal swab, presence of viral mutation(s) within the areas targeted by this assay, and inadequate number of viral copies(<138 copies/mL). A negative result must be combined with clinical observations, patient history, and epidemiological information. The expected result is Negative.  Fact Sheet for Patients:  BloggerCourse.com  Fact Sheet for Healthcare Providers:   SeriousBroker.it  This test is no t yet approved or cleared by the [place] FDA and  has been authorized for detection and/or diagnosis of SARS-CoV-2 by FDA under an Emergency Use Authorization (EUA). This EUA will remain  in effect (meaning this test can be used) for the duration of the COVID-19 declaration under Section 564(b)(1) of the Act, 21 U.S.C.section 360bbb-3(b)(1), unless the authorization is terminated  or revoked sooner.       Influenza A by PCR POSITIVE (A) NEGATIVE Final  Influenza B by PCR NEGATIVE NEGATIVE Final    Comment: (NOTE) The Xpert Xpress SARS-CoV-2/FLU/RSV plus assay is intended as an aid in the diagnosis of influenza from Nasopharyngeal swab specimens and should not be used as a sole basis for treatment. Nasal washings and aspirates are unacceptable for Xpert Xpress SARS-CoV-2/FLU/RSV testing.  Fact Sheet for Patients: BloggerCourse.com  Fact Sheet for Healthcare Providers: SeriousBroker.it  This test is not yet approved or cleared by the [place] FDA and has been authorized for detection and/or diagnosis of SARS-CoV-2 by FDA under an Emergency Use Authorization (EUA). This EUA will remain in effect (meaning this test can be used) for the duration of the COVID-19 declaration under Section 564(b)(1) of the Act, 21 U.S.C. section 360bbb-3(b)(1), unless the authorization is terminated or revoked.     Resp Syncytial Virus by PCR NEGATIVE NEGATIVE Final    Comment: (NOTE) Fact Sheet for Patients: BloggerCourse.com  Fact Sheet for Healthcare Providers: SeriousBroker.it  This test is not yet approved or cleared by the [place] FDA and has been authorized for detection and/or diagnosis of SARS-CoV-2 by FDA under an Emergency Use Authorization (EUA). This EUA will remain in effect (meaning this test can be used)  for the duration of the COVID-19 declaration under Section 564(b)(1) of the Act, 21 U.S.C. section 360bbb-3(b)(1), unless the authorization is terminated or revoked.  Performed at [facility], [address], [place], [place] [address]   Blood Culture (routine x 2)     Status: None (Preliminary result)   Collection Time: 03/24/23 12:18 PM   Specimen: BLOOD  Result Value Ref Range Status   Specimen Description BLOOD BLOOD LEFT ARM  Final   Special Requests   Final    Blood Culture results may not be optimal due to an inadequate volume of blood received in culture bottles BOTTLES DRAWN AEROBIC AND ANAEROBIC   Culture   Final    NO GROWTH 2 DAYS Performed at [facility], [address], [place], [place] [address]    Report Status PENDING  Incomplete  Blood Culture (routine x 2)     Status: None (Preliminary result)   Collection Time: 03/24/23 12:18 PM   Specimen: BLOOD  Result Value Ref Range Status   Specimen Description BLOOD rfoa  Final   Special Requests   Final    BOTTLES DRAWN AEROBIC AND ANAEROBIC Blood Culture results may not be optimal due to an inadequate volume of blood received in culture bottles   Culture   Final    NO GROWTH 2 DAYS Performed at [facility], [address] [place], [place] [address]    Report Status PENDING  Incomplete     Time coordinating discharge: Over 30 minutes  SIGNED:   Azucena Fallen, DO [facility] 03/26/2023, 12:55 PM Pager   If 7PM-7AM, please contact night-coverage [email or web address]

## 2023-03-26 NOTE — Plan of Care (Signed)
   Problem: Coping: Goal: Level of anxiety will decrease Outcome: Progressing   Problem: Pain Managment: Goal: General experience of comfort will improve and/or be controlled Outcome: Progressing   Problem: Safety: Goal: Ability to remain free from injury will improve Outcome: Progressing

## 2023-03-27 ENCOUNTER — Telehealth: Payer: Self-pay

## 2023-03-27 ENCOUNTER — Ambulatory Visit: Payer: Self-pay

## 2023-03-27 ENCOUNTER — Encounter (HOSPITAL_COMMUNITY): Payer: Self-pay | Admitting: Emergency Medicine

## 2023-03-27 ENCOUNTER — Emergency Department (HOSPITAL_COMMUNITY)

## 2023-03-27 ENCOUNTER — Inpatient Hospital Stay (HOSPITAL_COMMUNITY)
Admission: EM | Admit: 2023-03-27 | Discharge: 2023-03-30 | DRG: 193 | Disposition: A | Discharge status: Home / Self Care | Attending: Internal Medicine | Admitting: Internal Medicine

## 2023-03-27 ENCOUNTER — Other Ambulatory Visit: Payer: Self-pay

## 2023-03-27 DIAGNOSIS — M810 Age-related osteoporosis without current pathological fracture: Secondary | ICD-10-CM | POA: Diagnosis present

## 2023-03-27 DIAGNOSIS — Z1152 Encounter for screening for COVID-19: Secondary | ICD-10-CM

## 2023-03-27 DIAGNOSIS — Z811 Family history of alcohol abuse and dependence: Secondary | ICD-10-CM

## 2023-03-27 DIAGNOSIS — C159 Malignant neoplasm of esophagus, unspecified: Secondary | ICD-10-CM | POA: Diagnosis present

## 2023-03-27 DIAGNOSIS — K449 Diaphragmatic hernia without obstruction or gangrene: Secondary | ICD-10-CM | POA: Diagnosis present

## 2023-03-27 DIAGNOSIS — I5031 Acute diastolic (congestive) heart failure: Secondary | ICD-10-CM | POA: Diagnosis not present

## 2023-03-27 DIAGNOSIS — Z8261 Family history of arthritis: Secondary | ICD-10-CM

## 2023-03-27 DIAGNOSIS — E1165 Type 2 diabetes mellitus with hyperglycemia: Secondary | ICD-10-CM | POA: Diagnosis present

## 2023-03-27 DIAGNOSIS — G4733 Obstructive sleep apnea (adult) (pediatric): Secondary | ICD-10-CM | POA: Diagnosis present

## 2023-03-27 DIAGNOSIS — R0600 Dyspnea, unspecified: Secondary | ICD-10-CM | POA: Diagnosis not present

## 2023-03-27 DIAGNOSIS — Z803 Family history of malignant neoplasm of breast: Secondary | ICD-10-CM

## 2023-03-27 DIAGNOSIS — Z823 Family history of stroke: Secondary | ICD-10-CM

## 2023-03-27 DIAGNOSIS — J441 Chronic obstructive pulmonary disease with (acute) exacerbation: Secondary | ICD-10-CM | POA: Diagnosis not present

## 2023-03-27 DIAGNOSIS — J101 Influenza due to other identified influenza virus with other respiratory manifestations: Secondary | ICD-10-CM | POA: Diagnosis not present

## 2023-03-27 DIAGNOSIS — Z90710 Acquired absence of both cervix and uterus: Secondary | ICD-10-CM

## 2023-03-27 DIAGNOSIS — Z79899 Other long term (current) drug therapy: Secondary | ICD-10-CM

## 2023-03-27 DIAGNOSIS — Z8249 Family history of ischemic heart disease and other diseases of the circulatory system: Secondary | ICD-10-CM

## 2023-03-27 DIAGNOSIS — Z800 Family history of malignant neoplasm of digestive organs: Secondary | ICD-10-CM

## 2023-03-27 DIAGNOSIS — J9601 Acute respiratory failure with hypoxia: Secondary | ICD-10-CM | POA: Diagnosis present

## 2023-03-27 DIAGNOSIS — Z87891 Personal history of nicotine dependence: Secondary | ICD-10-CM

## 2023-03-27 DIAGNOSIS — Z813 Family history of other psychoactive substance abuse and dependence: Secondary | ICD-10-CM

## 2023-03-27 DIAGNOSIS — E782 Mixed hyperlipidemia: Secondary | ICD-10-CM | POA: Diagnosis present

## 2023-03-27 DIAGNOSIS — I10 Essential (primary) hypertension: Secondary | ICD-10-CM | POA: Diagnosis present

## 2023-03-27 DIAGNOSIS — Z825 Family history of asthma and other chronic lower respiratory diseases: Secondary | ICD-10-CM

## 2023-03-27 DIAGNOSIS — Z7951 Long term (current) use of inhaled steroids: Secondary | ICD-10-CM

## 2023-03-27 DIAGNOSIS — K219 Gastro-esophageal reflux disease without esophagitis: Secondary | ICD-10-CM | POA: Diagnosis not present

## 2023-03-27 DIAGNOSIS — Z833 Family history of diabetes mellitus: Secondary | ICD-10-CM

## 2023-03-27 DIAGNOSIS — Z8501 Personal history of malignant neoplasm of esophagus: Secondary | ICD-10-CM | POA: Diagnosis not present

## 2023-03-27 DIAGNOSIS — Z808 Family history of malignant neoplasm of other organs or systems: Secondary | ICD-10-CM

## 2023-03-27 DIAGNOSIS — Z7984 Long term (current) use of oral hypoglycemic drugs: Secondary | ICD-10-CM

## 2023-03-27 DIAGNOSIS — R7989 Other specified abnormal findings of blood chemistry: Secondary | ICD-10-CM | POA: Insufficient documentation

## 2023-03-27 DIAGNOSIS — I2510 Atherosclerotic heart disease of native coronary artery without angina pectoris: Secondary | ICD-10-CM | POA: Diagnosis present

## 2023-03-27 LAB — CBC WITH DIFFERENTIAL/PLATELET
Abs Immature Granulocytes: 0.05 10*3/uL (ref 0.00–0.07)
Basophils Absolute: 0 10*3/uL (ref 0.0–0.1)
Basophils Relative: 0 %
Eosinophils Absolute: 0 10*3/uL (ref 0.0–0.5)
Eosinophils Relative: 0 %
HCT: 34.4 % — ABNORMAL LOW (ref 36.0–46.0)
Hemoglobin: 10.9 g/dL — ABNORMAL LOW (ref 12.0–15.0)
Immature Granulocytes: 1 %
Lymphocytes Relative: 12 %
Lymphs Abs: 0.5 10*3/uL — ABNORMAL LOW (ref 0.7–4.0)
MCH: 27 pg (ref 26.0–34.0)
MCHC: 31.7 g/dL (ref 30.0–36.0)
MCV: 85.4 fL (ref 80.0–100.0)
Monocytes Absolute: 0.2 10*3/uL (ref 0.1–1.0)
Monocytes Relative: 4 %
Neutro Abs: 3.7 10*3/uL (ref 1.7–7.7)
Neutrophils Relative %: 83 %
Platelets: 157 10*3/uL (ref 150–400)
RBC: 4.03 MIL/uL (ref 3.87–5.11)
RDW: 14.9 % (ref 11.5–15.5)
WBC: 4.5 10*3/uL (ref 4.0–10.5)
nRBC: 0 % (ref 0.0–0.2)

## 2023-03-27 LAB — CBG MONITORING, ED: Glucose-Capillary: 232 mg/dL — ABNORMAL HIGH (ref 70–99)

## 2023-03-27 LAB — BRAIN NATRIURETIC PEPTIDE: B Natriuretic Peptide: 306 pg/mL — ABNORMAL HIGH (ref 0.0–100.0)

## 2023-03-27 LAB — COMPREHENSIVE METABOLIC PANEL
ALT: 13 U/L (ref 0–44)
AST: 19 U/L (ref 15–41)
Albumin: 3.6 g/dL (ref 3.5–5.0)
Alkaline Phosphatase: 65 U/L (ref 38–126)
Anion gap: 11 (ref 5–15)
BUN: 23 mg/dL (ref 8–23)
CO2: 29 mmol/L (ref 22–32)
Calcium: 9.4 mg/dL (ref 8.9–10.3)
Chloride: 96 mmol/L — ABNORMAL LOW (ref 98–111)
Creatinine, Ser: 0.89 mg/dL (ref 0.44–1.00)
GFR, Estimated: >60 mL/min (ref >60)
Glucose, Bld: 205 mg/dL — ABNORMAL HIGH (ref 70–99)
Potassium: 5.1 mmol/L (ref 3.5–5.1)
Sodium: 136 mmol/L (ref 135–145)
Total Bilirubin: 0.5 mg/dL (ref 0.0–1.2)
Total Protein: 7.5 g/dL (ref 6.5–8.1)

## 2023-03-27 LAB — TROPONIN I (HIGH SENSITIVITY): Troponin I (High Sensitivity): 4 ng/L (ref <18)

## 2023-03-27 MED ORDER — IPRATROPIUM-ALBUTEROL 0.5-2.5 (3) MG/3ML IN SOLN
9.0000 mL | Freq: Once | RESPIRATORY_TRACT | Status: AC
  Administered 2023-03-27: 9 mL via RESPIRATORY_TRACT
  Filled 2023-03-27: qty 9

## 2023-03-27 MED ORDER — ALBUTEROL (5 MG/ML) CONTINUOUS INHALATION SOLN
10.0000 mg/h | INHALATION_SOLUTION | Freq: Once | RESPIRATORY_TRACT | Status: DC
  Filled 2023-03-27: qty 20

## 2023-03-27 MED ORDER — ALBUTEROL SULFATE (2.5 MG/3ML) 0.083% IN NEBU
INHALATION_SOLUTION | RESPIRATORY_TRACT | Status: AC
  Administered 2023-03-27: 10 mg
  Filled 2023-03-27: qty 12

## 2023-03-27 MED ORDER — METHYLPREDNISOLONE SODIUM SUCC 125 MG IJ SOLR
125.0000 mg | Freq: Once | INTRAMUSCULAR | Status: AC
  Administered 2023-03-27: 125 mg via INTRAVENOUS
  Filled 2023-03-27: qty 2

## 2023-03-27 NOTE — ED Triage Notes (Signed)
 Pt d/c from [facility] yesterday after being admitted from flu complications. Pt initially was to be sent home with o2 and it was later decided not to send her home. She was unable to get her o2 up today so dr advised she come back to the ED pt is also tachycardic. Pt has hx of copd but does not normally require o2.

## 2023-03-27 NOTE — H&P (Signed)
 History and Physical    Patient: Erica Braun ZOX:[id number] DOB: [birth year] DOA: 03/27/2023 DOS: the patient was seen and examined on 03/28/2023 PCP: Junie Spencer, [name]  Patient coming from: Home  Chief Complaint:  Chief Complaint  Patient presents with   hypoxia   Tachycardia   HPI: Erica Braun is a 73 y.o. female with medical history significant of hypertension, hyperlipidemia, COPD, GERD, T2DM, OSA on CPAP, prior history of squamous cell carcinoma of esophageal cancer who presented to the emergency department due to shortness of breath. Patient was admitted from 3/18 to 3/20 due to acute hypoxic respiratory failure in the setting of influenza A infection and pneumonia, she was discharged on Tamiflu and oxygen was weaned back to room air prior to discharge. Patient complained of worsening shortness of breath on getting home with O2 sats in the mid 80s.  She spoke with PCP who asked to return to the ED for further evaluation and management.  She denies nausea, vomiting, chest pain, abdominal pain.  ED Course:  In the emergency department, patient was tachypneic, tachycardic, BP was 159/78, temperature 98.9 F, O2 sat 91% on room air.  Workup in the ED showed normocytic anemia, BMP was normal except for chloride of 96 and blood glucose 205, BNP 306, troponin x 1 - 4.  Blood culture pending. Chest x-ray showed no acute abnormality Patient was treated with IV Solu-Medrol 125 mg x 1 and DuoNeb. Hospitalist was asked to admit patient for further evaluation and management.  Review of Systems: Review of systems as noted in the HPI. All other systems reviewed and are negative.   Past Medical History:  Diagnosis Date   Complication of anesthesia    COPD (chronic obstructive pulmonary disease) (HCC)    Coronary artery disease    Diabetes mellitus without complication (HCC)    Diverticulosis    Early cataracts, bilateral 11/21/2016   Esophageal cancer (HCC)     GERD (gastroesophageal reflux disease)    Glaucoma    patient denies   Hyperlipidemia    Hypertension    Internal hemorrhoids    Osteoporosis 12/16/2021   Pneumonia    PONV (postoperative nausea and vomiting)    Sleep apnea    uses Cpap   Spasm of the cricopharyngeus muscle    Squamous cell esophageal cancer (HCC) 05/10/2018   Past Surgical History:  Procedure Laterality Date   ABDOMINAL HYSTERECTOMY     cervical dysplasia   BIOPSY  05/03/2018   Procedure: BIOPSY;  Surgeon: Lemar Lofty., MD;  Location: [facility] ENDOSCOPY;  Service: Gastroenterology;;   BREAST BIOPSY Left    BREAST BIOPSY Right 11/27/2021   MM RT BREAST BX W LOC DEV 1ST LESION IMAGE BX SPEC STEREO GUIDE 11/27/2021 [facility]   BREAST BIOPSY  01/14/2022   MM RT RADIOACTIVE SEED LOC MAMMO GUIDE 01/14/2022 [facility]   BREAST LUMPECTOMY Left    BREAST LUMPECTOMY WITH RADIOACTIVE SEED LOCALIZATION Right 01/15/2022   Procedure: RIGHT BREAST LUMPECTOMY WITH RADIOACTIVE SEED LOCALIZATION;  Surgeon: Harriette Bouillon, MD;  Location: [facility] OR;  Service: General;  Laterality: Right;   ESOPHAGOGASTRODUODENOSCOPY (EGD) WITH PROPOFOL N/A 05/03/2018   Procedure: ESOPHAGOGASTRODUODENOSCOPY (EGD) WITH PROPOFOL;  Surgeon: Lemar Lofty., MD;  Location: [facility] ENDOSCOPY;  Service: Gastroenterology;  Laterality: N/A;   ESOPHAGOGASTRODUODENOSCOPY (EGD) WITH PROPOFOL N/A 11/18/2018   Procedure: ESOPHAGOGASTRODUODENOSCOPY (EGD) WITH PROPOFOL;  Surgeon: Hilarie Fredrickson, MD;  Location: [facility] END[facility]COPY;  Service: Endoscopy;  Laterality: N/A;   EUS  05/03/2018  Procedure: UPPER ENDOSCOPIC ULTRASOUND (EUS) RADIAL;  Surgeon: Meridee Score Netty Starring., MD;  Location: [facility] ENDOSCOPY;  Service: Gastroenterology;;   FOREIGN BODY REMOVAL  11/18/2018   Procedure: FOREIGN BODY REMOVAL;  Surgeon: Hilarie Fredrickson, MD;  Location: [facility] ENDOSCOPY;  Service: Endoscopy;;   LEFT HEART CATH AND CORONARY ANGIOGRAPHY N/A 06/11/2017   Procedure: LEFT HEART CATH  AND CORONARY ANGIOGRAPHY;  Surgeon: Lyn Records, MD;  Location: [facility] INVASIVE CV LAB;  Service: Cardiovascular;  Laterality: N/A;    Social History:  reports that she quit smoking about 15 years ago. Her smoking use included cigarettes. She started smoking about 56 years ago. She has a 61.5 pack-year smoking history. She has never used smokeless tobacco. She reports current alcohol use. She reports that she does not use drugs.   No Known Allergies  Family History  Problem Relation Age of Onset   Breast cancer Mother        dx 46s; mets   Arthritis Mother    COPD Father 59       emphysema   Alcohol abuse Father    Diabetes Father    Breast cancer Sister        dx late 59s; mat half sister   Early death Brother        overdose   Drug abuse Brother    Alcohol abuse Paternal Aunt    COPD Paternal Aunt    Throat cancer Paternal Uncle    Liver cancer Maternal Grandmother        or other primary; d. late 55s   Diabetes Maternal Grandmother    Alcohol abuse Maternal Grandfather    Early death Maternal Grandfather    Stroke Paternal Grandmother    Heart disease Paternal Grandfather    Esophageal cancer Neg Hx    Stomach cancer Neg Hx    Colon cancer Neg Hx      Prior to Admission medications   Medication Sig Start Date End Date Taking? Authorizing Provider  albuterol (PROVENTIL) (2.5 MG/3ML) 0.083% nebulizer solution Take 3 mLs (2.5 mg total) by nebulization every 6 (six) hours as needed for wheezing or shortness of breath. 08/05/21   [name] A, [name]  aspirin EC 81 MG tablet Take 81 mg by mouth in the morning.    [provider]  atenolol (TENORMIN) 25 MG tablet Take 1 tablet (25 mg total) by mouth 2 (two) times daily. 03/26/23   Azucena Fallen, MD  calcium carbonate (OSCAL) 1500 (600 Ca) MG TABS tablet Take 600 mg by mouth in the morning and at bedtime.    [provider]  Cholecalciferol (VITAMIN D-3 PO) Take 5,000 Units by mouth in the morning.     [provider]  empagliflozin (JARDIANCE) 10 MG TABS tablet Take 1 tablet (10 mg total) by mouth daily before breakfast. 02/10/23   Junie Spencer, [name]  famotidine (PEPCID) 40 MG tablet Take 1 tablet (40 mg total) by mouth at bedtime. 01/01/23   [name] A, [name]  fluticasone-salmeterol (ADVAIR) 250-50 MCG/ACT AEPB Inhale 1 puff into the lungs every 12 (twelve) hours. 03/10/22   Coralyn Helling, MD  glimepiride (AMARYL) 2 MG tablet Take 1 tablet (2 mg total) by mouth daily as needed (blood sugar more than 200.). Patient not taking: Reported on 03/27/2023 01/01/23   [name] A, [name]  isosorbide mononitrate (IMDUR) 30 MG 24 hr tablet Take 0.5 tablets (15 mg total) by mouth daily. 03/27/23   Azucena Fallen, MD  metFORMIN (GLUCOPHAGE) 1000 MG tablet Take 1 tablet (1,000 mg total) by mouth 2 (two) times daily with a meal. 01/01/23   Hawks, Neysa Bonito A, [name]  Multiple Vitamins-Minerals (ADULT ONE DAILY GUMMIES PO) Take 2 tablets by mouth in the morning. Women's Multi by [employer]    [provider]  nitroGLYCERIN (NITROSTAT) 0.4 MG SL tablet Place 1 tablet (0.4 mg total) under the tongue every 5 (five) minutes as needed for chest pain. 07/04/22 07/04/23  Wendall Stade, MD  omeprazole (PRILOSEC) 40 MG capsule Take 1 capsule (40 mg total) by mouth daily. 01/01/23   Junie Spencer, [name]  oseltamivir (TAMIFLU) 30 MG capsule Take 1 capsule (30 mg total) by mouth 2 (two) times daily for 3 days. 03/26/23 03/29/23  Azucena Fallen, MD  predniSONE (DELTASONE) 10 MG tablet Take 4 tablets (40 mg total) by mouth daily for 3 days, THEN 3 tablets (30 mg total) daily for 3 days, THEN 2 tablets (20 mg total) daily for 3 days, THEN 1 tablet (10 mg total) daily for 3 days. 03/26/23 04/07/23  Azucena Fallen, MD  rosuvastatin (CRESTOR) 10 MG tablet Take 1 tablet by mouth once daily 10/08/22   Wendall Stade, MD    Physical Exam: BP (!) 109/49   Pulse 86   Temp 97.8 F (36.6 C) (Oral)    Resp 19   Ht 5' (1.524 m)   Wt 60.8 kg   SpO2 96%   BMI 26.17 kg/m   General: 73 y.o. year-old female well developed well nourished in no acute distress.  Alert and oriented x3. HEENT: NCAT, EOMI Neck: Supple, trachea medial Cardiovascular: Regular rate and rhythm with no rubs or gallops.  No thyromegaly or JVD noted.  No lower extremity edema. 2/4 pulses in all 4 extremities. Respiratory: Clear to auscultation with no wheezes or rales. Good inspiratory effort. Abdomen: Soft, nontender nondistended with normal bowel sounds x4 quadrants. Muskuloskeletal: No cyanosis, clubbing or edema noted bilaterally Neuro: CN II-XII intact, strength 5/5 x 4, sensation, reflexes intact Skin: No ulcerative lesions noted or rashes Psychiatry: Judgement and insight appear normal. Mood is appropriate for condition and setting          Labs on Admission:  Basic Metabolic Panel: Recent Labs  Lab 03/24/23 1218 03/25/23 0411 03/27/23 1909  NA 132* 137 136  K 3.9 4.0 5.1  CL 95* 101 96*  CO2 26 26 29   GLUCOSE 200* 160* 205*  BUN 19 24* 23  CREATININE 0.70 0.62 0.89  CALCIUM 9.1 8.9 9.4  MG 2.2  --   --    Liver Function Tests: Recent Labs  Lab 03/24/23 1218 03/27/23 1909  AST 15 19  ALT 14 13  ALKPHOS 76 65  BILITOT 0.5 0.5  PROT 7.5 7.5  ALBUMIN 3.6 3.6   No results for input(s): "LIPASE", "AMYLASE" in the last 168 hours. No results for input(s): "AMMONIA" in the last 168 hours. CBC: Recent Labs  Lab 03/24/23 1218 03/25/23 0411 03/27/23 1909  WBC 4.6 3.9* 4.5  NEUTROABS 3.9  --  3.7  HGB 10.9* 9.7* 10.9*  HCT 35.0* 32.2* 34.4*  MCV 85.4 87.0 85.4  PLT 110* 108* 157   Cardiac Enzymes: No results for input(s): "CKTOTAL", "CKMB", "CKMBINDEX", "TROPONINI" in the last 168 hours.  BNP (last 3 results) Recent Labs    03/27/23 1909  BNP 306.0*    ProBNP (last 3 results) No results for input(s): "PROBNP" in the last 8760 hours.  CBG: Recent  Labs  Lab 03/27/23 1856   GLUCAP 232*    Radiological Exams on Admission: DG Chest 2 View Result Date: 03/27/2023 CLINICAL DATA:  Dyspnea EXAM: CHEST - 2 VIEW COMPARISON:  03/24/2023 FINDINGS: Cardiac shadows within normal limits. Calcified granuloma is again seen in the right mid lung. Nodular density in the right upper lobe is noted similar to that seen on prior PET-CT. The known left lower lobe nodule is not as well appreciated on this exam. Bony structures are within normal limits. IMPRESSION: No acute abnormality noted. Electronically Signed   By: Alcide Clever M.D.   On: 03/27/2023 20:05    EKG: I independently viewed the EKG done and my findings are as followed: EKG showed sinus tachycardia at a rate of 102 bpm  Assessment/Plan Present on Admission:  Acute exacerbation of chronic obstructive pulmonary disease (COPD) (HCC)  Influenza A  Mixed hyperlipidemia  History of esophageal cancer  Principal Problem:   Acute exacerbation of chronic obstructive pulmonary disease (COPD) (HCC) Active Problems:   Influenza A   Mixed hyperlipidemia   OSA on CPAP   History of esophageal cancer   Elevated brain natriuretic peptide (BNP) level   Type 2 diabetes mellitus with hyperglycemia (HCC)   GERD (gastroesophageal reflux disease)  Acute exacerbation of COPD Acute respiratory failure with hypoxia Continue duo nebs, Mucinex, Robitussin Solu-Medrol, azithromycin. Continue Protonix to prevent steroid-induced ulcer Continue incentive spirometry and flutter valve Continue supplemental oxygen to maintain O2 sat > 92% with plan to wean patient off oxygen as tolerated  Elevated BNP, rule out CHF BNP 306 Continue total intake/output, daily weights Continue heart healthy/carb modified diet  Echocardiogram in the morning   Type 2 diabetes mellitus with hyperglycemia Continue ISS and hypoglycemia protocol Hold oral hypoglycemic agents while inpatient  Influenza A Continue Tamiflu  OSA on CPAP Continue  CPAP  Mixed hyperlipidemia Continue statin  GERD/history of esophageal cancer Continue Protonix  Mixed hyperlipidemia Continue Crestor   DVT prophylaxis: Lovenox   Code Status: Full code  Family Communication: None at bedside  Consults: None  Severity of Illness: The appropriate patient status for this patient is OBSERVATION. Observation status is judged to be reasonable and necessary in order to provide the required intensity of service to ensure the patient's safety. The patient's presenting symptoms, physical exam findings, and initial radiographic and laboratory data in the context of their medical condition is felt to place them at decreased risk for further clinical deterioration. Furthermore, it is anticipated that the patient will be medically stable for discharge from the hospital within 2 midnights of admission.   Author: Frankey Shown, DO 03/28/2023 4:20 AM  For on call review [email or web address].

## 2023-03-27 NOTE — Telephone Encounter (Signed)
  Chief Complaint: O2 sat levels 80s-92% SOB with exertion.  Symptoms: O2 sat fluctuating 80's - 92% RA . Now 92% RA. SOB with exertion. Blood sugars have been elevated and patient took some of her medications since being home from hospital and now blood glucose 153.  Frequency: today  Pertinent Negatives: Patient denies chest pain no difficulty breathing not wearing oxygen Disposition: [] ED /[] Urgent Care (no appt availability in office) / [] Appointment(In office/virtual)/ []  [facility]/ [] Home Care/ [] Refused Recommended Disposition /[] [facility]/ []  Follow-up with PCP Additional Notes:    Call disconnected during triage due to system issue.     Reason for Disposition  Oxygen level (e.g., pulse oximetry) 91 to 94 percent  Answer Assessment - Initial Assessment Questions 1. MAIN CONCERN OR SYMPTOM: "What's your main concern?" (e.g., low oxygen level, breathing difficulty) "What question do you have?"     Low oxygen levels 80-92% on RA 2. ONSET: "When did the  sx   start?"      This am in the 80's,  3. OXYGEN THERAPY:    - "Do you currently use home oxygen?"    - If Yes, ask: "What is your oxygen source?" (e.g., O2 tank, O2 concentrator).    - If Yes, ask: "How do you get the oxygen?" (e.g., nasal prongs, face mask).    - If Yes, ask: "How much oxygen are you supposed to use?" (e.g., 1-2 L [place])     No  4.  OXYGEN EQUIPMENT:  "Are you having any trouble with your oxygen equipment?"  (e.g., cannula, mask, tubing, tank, concentrator)     Na  5. OXYGEN SATURATION MONITOR:     - "Do you use an oxygen saturation monitor (pulse oximeter) at home?"    - If Yes, ask: "Where do you place the probe?" (e.g., fingertip, ear lobe)     Yes placing probe on finger 6. OXYGEN LEVEL: "What is your reading (oxygen level) today?" "What is your usual oxygen saturation reading?" (e.g., 95%)     O 2 sat at 92% HR 101 7. VSS MONITORING: "Do you monitor/measure your oxygen level or  vital signs?" (e.g., yes, no, measurements are automatically sent to provider/call center). Document CURRENT and NORMAL BASELINE values if available.     -  P: "What is your pulse rate per minute?"   -  RR: "What is your respiratory rate per minute?"     Pulse rate 101 8. BREATHING DIFFICULTY: "Are you having any difficulty breathing?" If Yes, ask: "How bad is it?"  (e.g., none, mild, moderate, severe)    - MILD: No SOB at rest, mild SOB with walking, speaks normally in sentences, able to lie down, no retractions, pulse < 100.    - MODERATE: SOB at rest, SOB with minimal exertion and prefers to sit, cannot lie down flat, speaks in phrases, mild retractions, audible wheezing, pulse 100-120.    - SEVERE: Very SOB at rest, speaks in single words, struggling to breathe, sitting hunched forward, retractions, pulse > 120      SOB with exertion but not gasping for air  9. OTHER SYMPTOMS: "Do you have any other symptoms?" (e.g., fever, change in sputum)     O2 sat between 80's - 92% 10. SMOKING: "Do you smoke currently?" "Is there anyone that smokes around you?"  (Note: smoking around oxygen is dangerous!)       na  Protocols used: Oxygen Monitoring and Hypoxia-A-AH

## 2023-03-27 NOTE — Telephone Encounter (Signed)
 Copied from CRM [id number]. Topic: Clinical - Home Health Verbal Orders >> Mar 27, 2023 11:45 AM Alessandra Bevels wrote: Lorene Dy calling from [facility] Case management is calling to report that the patient has some concerns about her oxygen level - 80s went back up to 92.   Lorene Dy this it may be a good idea for order for home health nurse maybe Frances Furbish. [phone]

## 2023-03-27 NOTE — Telephone Encounter (Signed)
 1st attempt- received VMB. LVM to return call. Will continue to attempt.   Copied from CRM [id number]. Topic: Clinical - Medical Advice >> Mar 27, 2023  9:27 AM Everette C wrote: Reason for CRM: The patient would like to be contacted by a member of clinical staff when possible to address concerns related to their oxygen levels (85-91)  The patient has recently been discharged from the hospital and would like to ensure that their oxygen level is where it should be post discharge  The patient is not currently experiencing any shortness of breath or difficulty breathing

## 2023-03-27 NOTE — H&P (Incomplete)
 History and Physical    PatientKlohe Braun Braun:[id number] DOB: [birth year] DOA: 03/27/2023 DOS: the patient was seen and examined on 03/27/2023 PCP: Junie Spencer, [name]  Patient coming from: {Point_of_Origin:[address]}  Chief Complaint:  Chief Complaint  Patient presents with  . hypoxia  . Tachycardia   HPI: Erica Braun is a 73 y.o. female with medical history significant of ***  Review of Systems: {ROS_Text:26778} Past Medical History:  Diagnosis Date  . Complication of anesthesia   . COPD (chronic obstructive pulmonary disease) (HCC)   . Coronary artery disease   . Diabetes mellitus without complication (HCC)   . Diverticulosis   . Early cataracts, bilateral 11/21/2016  . Esophageal cancer (HCC)   . GERD (gastroesophageal reflux disease)   . Glaucoma    patient denies  . Hyperlipidemia   . Hypertension   . Internal hemorrhoids   . Osteoporosis 12/16/2021  . Pneumonia   . PONV (postoperative nausea and vomiting)   . Sleep apnea    uses Cpap  . Spasm of the cricopharyngeus muscle   . Squamous cell esophageal cancer (HCC) 05/10/2018   Past Surgical History:  Procedure Laterality Date  . ABDOMINAL HYSTERECTOMY     cervical dysplasia  . BIOPSY  05/03/2018   Procedure: BIOPSY;  Surgeon: Meridee Score Netty Starring., MD;  Location: [facility] ENDOSCOPY;  Service: Gastroenterology;;  . BREAST BIOPSY Left   . BREAST BIOPSY Right 11/27/2021   MM RT BREAST BX W LOC DEV 1ST LESION IMAGE BX SPEC STEREO GUIDE 11/27/2021 [facility] M[facility]MOGR[facility]  . BREAST BIOPSY  01/14/2022   MM RT RADIOACTIVE SEED LOC MAMMO GUIDE 01/14/2022 [facility]  . BREAST LUMPECTOMY Left   . BREAST LUMPECTOMY WITH RADIOACTIVE SEED LOCALIZATION Right 01/15/2022   Procedure: RIGHT BREAST LUMPECTOMY WITH RADIOACTIVE SEED LOCALIZATION;  Surgeon: Harriette Bouillon, MD;  Location: [facility] OR;  Service: General;  Laterality: Right;  . ESOPHAGOGASTRODUODENOSCOPY (EGD) WITH PROPOFOL N/A 05/03/2018   Procedure:  ESOPHAGOGASTRODUODENOSCOPY (EGD) WITH PROPOFOL;  Surgeon: Lemar Lofty., MD;  Location: [facility] ENDOSCOPY;  Service: Gastroenterology;  Laterality: N/A;  . ESOPHAGOGASTRODUODENOSCOPY (EGD) WITH PROPOFOL N/A 11/18/2018   Procedure: ESOPHAGOGASTRODUODENOSCOPY (EGD) WITH PROPOFOL;  Surgeon: Hilarie Fredrickson, MD;  Location: [facility] ENDOSCOPY;  Service: Endoscopy;  Laterality: N/A;  . EUS  05/03/2018   Procedure: UPPER ENDOSCOPIC ULTRASOUND (EUS) RADIAL;  Surgeon: Meridee Score Netty Starring., MD;  Location: [facility] ENDOSCOPY;  Service: Gastroenterology;;  . FOREIGN BODY REMOVAL  11/18/2018   Procedure: FOREIGN BODY REMOVAL;  Surgeon: Hilarie Fredrickson, MD;  Location: [facility] ENDOSCOPY;  Service: Endoscopy;;  . LEFT HEART CATH AND CORONARY ANGIOGRAPHY N/A 06/11/2017   Procedure: LEFT HEART CATH AND CORONARY ANGIOGRAPHY;  Surgeon: Lyn Records, MD;  Location: [facility] INVASIVE CV LAB;  Service: Cardiovascular;  Laterality: N/A;   Social History:  reports that she quit smoking about 15 years ago. Her smoking use included cigarettes. She started smoking about 56 years ago. She has a 61.5 pack-year smoking history. She has never used smokeless tobacco. She reports current alcohol use. She reports that she does not use drugs.  No Known Allergies  Family History  Problem Relation Age of Onset  . Breast cancer Mother        dx 21s; mets  . Arthritis Mother   . COPD Father 88       emphysema  . Alcohol abuse Father   . Diabetes Father   . Breast cancer Sister        dx late 57s; mat half  sister  . Early death Brother        overdose  . Drug abuse Brother   . Alcohol abuse Paternal Aunt   . COPD Paternal Aunt   . Throat cancer Paternal Uncle   . Liver cancer Maternal Grandmother        or other primary; d. late 80s  . Diabetes Maternal Grandmother   . Alcohol abuse Maternal Grandfather   . Early death Maternal Grandfather   . Stroke Paternal Grandmother   . Heart disease Paternal Grandfather   . Esophageal cancer Neg Hx    . Stomach cancer Neg Hx   . Colon cancer Neg Hx     Prior to Admission medications   Medication Sig Start Date End Date Taking? Authorizing Provider  albuterol (PROVENTIL) (2.5 MG/3ML) 0.083% nebulizer solution Take 3 mLs (2.5 mg total) by nebulization every 6 (six) hours as needed for wheezing or shortness of breath. 08/05/21   [name] A, [name]  aspirin EC 81 MG tablet Take 81 mg by mouth in the morning.    [provider]  atenolol (TENORMIN) 25 MG tablet Take 1 tablet (25 mg total) by mouth 2 (two) times daily. 03/26/23   Azucena Fallen, MD  calcium carbonate (OSCAL) 1500 (600 Ca) MG TABS tablet Take 600 mg by mouth in the morning and at bedtime.    [provider]  Cholecalciferol (VITAMIN D-3 PO) Take 5,000 Units by mouth in the morning.    [provider]  empagliflozin (JARDIANCE) 10 MG TABS tablet Take 1 tablet (10 mg total) by mouth daily before breakfast. 02/10/23   Junie Spencer, [name]  famotidine (PEPCID) 40 MG tablet Take 1 tablet (40 mg total) by mouth at bedtime. 01/01/23   [name] A, [name]  fluticasone-salmeterol (ADVAIR) 250-50 MCG/ACT AEPB Inhale 1 puff into the lungs every 12 (twelve) hours. 03/10/22   Coralyn Helling, MD  glimepiride (AMARYL) 2 MG tablet Take 1 tablet (2 mg total) by mouth daily as needed (blood sugar more than 200.). Patient not taking: Reported on 03/27/2023 01/01/23   [name] A, [name]  isosorbide mononitrate (IMDUR) 30 MG 24 hr tablet Take 0.5 tablets (15 mg total) by mouth daily. 03/27/23   Azucena Fallen, MD  metFORMIN (GLUCOPHAGE) 1000 MG tablet Take 1 tablet (1,000 mg total) by mouth 2 (two) times daily with a meal. 01/01/23   Hawks, Neysa Bonito A, [name]  Multiple Vitamins-Minerals (ADULT ONE DAILY GUMMIES PO) Take 2 tablets by mouth in the morning. Women's Multi by [employer]    [provider]  nitroGLYCERIN (NITROSTAT) 0.4 MG SL tablet Place 1 tablet (0.4 mg total) under the tongue every 5 (five)  minutes as needed for chest pain. 07/04/22 07/04/23  Wendall Stade, MD  omeprazole (PRILOSEC) 40 MG capsule Take 1 capsule (40 mg total) by mouth daily. 01/01/23   Junie Spencer, [name]  oseltamivir (TAMIFLU) 30 MG capsule Take 1 capsule (30 mg total) by mouth 2 (two) times daily for 3 days. 03/26/23 03/29/23  Azucena Fallen, MD  predniSONE (DELTASONE) 10 MG tablet Take 4 tablets (40 mg total) by mouth daily for 3 days, THEN 3 tablets (30 mg total) daily for 3 days, THEN 2 tablets (20 mg total) daily for 3 days, THEN 1 tablet (10 mg total) daily for 3 days. 03/26/23 04/07/23  Azucena Fallen, MD  rosuvastatin (CRESTOR) 10 MG tablet Take 1 tablet by mouth once daily 10/08/22   Wendall Stade, MD  Physical Exam: Vitals:   03/27/23 2118 03/27/23 2159 03/27/23 2211 03/27/23 2218  BP:  (!) 109/53    Pulse:  (!) 122 (!) 117 (!) 116  Resp:  (!) 22 (!) 23 (!) 23  Temp:  98 F (36.7 C)    TempSrc:  Oral    SpO2: 90% 96% (!) 89% 95%  Weight:      Height:       *** Data Reviewed: {Tip this will not be part of the note when signed- Document your independent interpretation of telemetry tracing, EKG, lab, Radiology test or any other diagnostic tests. Add any new diagnostic test ordered today. (Optional):26781} {Results:26384}  Assessment and Plan: No notes have been filed under this hospital service. Service: Hospitalist     Advance Care Planning:   Code Status: Prior ***  Consults: ***  Family Communication: ***  Severity of Illness: {Observation/Inpatient:21159}  Author: Frankey Shown, DO 03/27/2023 11:23 PM  For on call review [email or web address].

## 2023-03-27 NOTE — ED Provider Notes (Signed)
 [place] EMERGENCY DEPARTMENT AT [facility] Provider Note  CSN: [id number] Arrival date & time: 03/27/23 1707  Chief Complaint(s) hypoxia and Tachycardia  HPI Erica Braun is a 73 y.o. female with PMH COPD not on home oxygen, OSA on CPAP, HTN, HLD, GERD, T2DM, esophageal cancer who presents emerged apartment for evaluation of shortness of breath.  Patient discharged yesterday for hypoxic respiratory failure in the setting of influenza.  Respiratory status worsened at home and patient states that her home pulse ox was reading in the mid 80s.  Primary care physician referred her back to Emergency Department.  Here in the ER, she is tachypneic with O2 sats in the upper 80s on room air and persistently wheezing.  Denies chest pain, abdominal pain, nausea, vomiting or other systemic symptoms.   Past Medical History Past Medical History:  Diagnosis Date   Complication of anesthesia    COPD (chronic obstructive pulmonary disease) (HCC)    Coronary artery disease    Diabetes mellitus without complication (HCC)    Diverticulosis    Early cataracts, bilateral 11/21/2016   Esophageal cancer (HCC)    GERD (gastroesophageal reflux disease)    Glaucoma    patient denies   Hyperlipidemia    Hypertension    Internal hemorrhoids    Osteoporosis 12/16/2021   Pneumonia    PONV (postoperative nausea and vomiting)    Sleep apnea    uses Cpap   Spasm of the cricopharyngeus muscle    Squamous cell esophageal cancer (HCC) 05/10/2018   Patient Active Problem List   Diagnosis Date Noted   Acute respiratory failure with hypoxia (HCC) 03/24/2023   Influenza A 03/24/2023   Osteoporosis 12/16/2021   History of esophageal cancer 08/05/2021   Esophageal dysphagia 11/21/2018   Esophageal stenosis 11/21/2018   Esophageal stricture    CAD in native artery 06/11/2017   Abnormal nuclear cardiac imaging test 06/11/2017   Angina pectoris (HCC)    OSA on CPAP 12/19/2016   Diabetes  mellitus (HCC) 11/21/2016   GERD with esophagitis 11/21/2016   History of esophageal stricture 11/21/2016   Hypertension associated with diabetes (HCC) 11/21/2016   Hyperlipidemia associated with type 2 diabetes mellitus (HCC) 11/21/2016   COPD (chronic obstructive pulmonary disease) (HCC) 11/21/2016   Early cataracts, bilateral 11/21/2016   Home Medication(s) Prior to Admission medications   Medication Sig Start Date End Date Taking? Authorizing Provider  albuterol (PROVENTIL) (2.5 MG/3ML) 0.083% nebulizer solution Take 3 mLs (2.5 mg total) by nebulization every 6 (six) hours as needed for wheezing or shortness of breath. 08/05/21   [name] A, [name]  aspirin EC 81 MG tablet Take 81 mg by mouth in the morning.    [provider]  atenolol (TENORMIN) 25 MG tablet Take 1 tablet (25 mg total) by mouth 2 (two) times daily. 03/26/23   Azucena Fallen, MD  calcium carbonate (OSCAL) 1500 (600 Ca) MG TABS tablet Take 600 mg by mouth in the morning and at bedtime.    [provider]  Cholecalciferol (VITAMIN D-3 PO) Take 5,000 Units by mouth in the morning.    [provider]  empagliflozin (JARDIANCE) 10 MG TABS tablet Take 1 tablet (10 mg total) by mouth daily before breakfast. 02/10/23   Junie Spencer, [name]  famotidine (PEPCID) 40 MG tablet Take 1 tablet (40 mg total) by mouth at bedtime. 01/01/23   [name] A, [name]  fluticasone-salmeterol (ADVAIR) 250-50 MCG/ACT AEPB Inhale 1 puff into the lungs every  12 (twelve) hours. 03/10/22   Coralyn Helling, MD  glimepiride (AMARYL) 2 MG tablet Take 1 tablet (2 mg total) by mouth daily as needed (blood sugar more than 200.). Patient not taking: Reported on 03/27/2023 01/01/23   [name] A, [name]  isosorbide mononitrate (IMDUR) 30 MG 24 hr tablet Take 0.5 tablets (15 mg total) by mouth daily. 03/27/23   Azucena Fallen, MD  metFORMIN (GLUCOPHAGE) 1000 MG tablet Take 1 tablet (1,000 mg total) by mouth 2 (two) times daily  with a meal. 01/01/23   Hawks, Neysa Bonito A, [name]  Multiple Vitamins-Minerals (ADULT ONE DAILY GUMMIES PO) Take 2 tablets by mouth in the morning. Women's Multi by [employer]    [provider]  nitroGLYCERIN (NITROSTAT) 0.4 MG SL tablet Place 1 tablet (0.4 mg total) under the tongue every 5 (five) minutes as needed for chest pain. 07/04/22 07/04/23  Wendall Stade, MD  omeprazole (PRILOSEC) 40 MG capsule Take 1 capsule (40 mg total) by mouth daily. 01/01/23   Junie Spencer, [name]  oseltamivir (TAMIFLU) 30 MG capsule Take 1 capsule (30 mg total) by mouth 2 (two) times daily for 3 days. 03/26/23 03/29/23  Azucena Fallen, MD  predniSONE (DELTASONE) 10 MG tablet Take 4 tablets (40 mg total) by mouth daily for 3 days, THEN 3 tablets (30 mg total) daily for 3 days, THEN 2 tablets (20 mg total) daily for 3 days, THEN 1 tablet (10 mg total) daily for 3 days. 03/26/23 04/07/23  Azucena Fallen, MD  rosuvastatin (CRESTOR) 10 MG tablet Take 1 tablet by mouth once daily 10/08/22   Wendall Stade, MD                                                                                                                                    Past Surgical History Past Surgical History:  Procedure Laterality Date   ABDOMINAL HYSTERECTOMY     cervical dysplasia   BIOPSY  05/03/2018   Procedure: BIOPSY;  Surgeon: Lemar Lofty., MD;  Location: [facility] ENDOSCOPY;  Service: Gastroenterology;;   BREAST BIOPSY Left    BREAST BIOPSY Right 11/27/2021   MM RT BREAST BX W LOC DEV 1ST LESION IMAGE BX SPEC STEREO GUIDE 11/27/2021 [facility]MOGR[facility]   BREAST BIOPSY  01/14/2022   MM RT RADIOACTIVE SEED LOC MAMMO GUIDE 01/14/2022 [facility]MO[facility]   BREAST LUMPECTOMY Left    BREAST LUMPECTOMY WITH RADIOACTIVE SEED LOCALIZATION Right 01/15/2022   Procedure: RIGHT BREAST LUMPECTOMY WITH RADIOACTIVE SEED LOCALIZATION;  Surgeon: Harriette Bouillon, MD;  Location: [facility] OR;  Service: General;  Laterality: Right;    ESOPHAGOGASTRODUODENOSCOPY (EGD) WITH PROPOFOL N/A 05/03/2018   Procedure: ESOPHAGOGASTRODUODENOSCOPY (EGD) WITH PROPOFOL;  Surgeon: Lemar Lofty., MD;  Location: [facility] ENDOSCOPY;  Service: Gastroenterology;  Laterality: N/A;   ESOPHAGOGASTRODUODENOSCOPY (EGD) WITH PROPOFOL N/A 11/18/2018   Procedure: ESOPHAGOGASTRODUODENOSCOPY (EGD) WITH PROPOFOL;  Surgeon: Hilarie Fredrickson, MD;  Location: [facility]  ENDOSCOPY;  Service: Endoscopy;  Laterality: N/A;   EUS  05/03/2018   Procedure: UPPER ENDOSCOPIC ULTRASOUND (EUS) RADIAL;  Surgeon: Lemar Lofty., MD;  Location: [facility] ENDOSCOPY;  Service: Gastroenterology;;   FOREIGN BODY REMOVAL  11/18/2018   Procedure: FOREIGN BODY REMOVAL;  Surgeon: Hilarie Fredrickson, MD;  Location: [facility] ENDOSCOPY;  Service: Endoscopy;;   LEFT HEART CATH AND CORONARY ANGIOGRAPHY N/A 06/11/2017   Procedure: LEFT HEART CATH AND CORONARY ANGIOGRAPHY;  Surgeon: Lyn Records, MD;  Location: [facility] INVASIVE CV LAB;  Service: Cardiovascular;  Laterality: N/A;   Family History Family History  Problem Relation Age of Onset   Breast cancer Mother        dx 4s; mets   Arthritis Mother    COPD Father 1       emphysema   Alcohol abuse Father    Diabetes Father    Breast cancer Sister        dx late 63s; mat half sister   Early death Brother        overdose   Drug abuse Brother    Alcohol abuse Paternal Aunt    COPD Paternal Aunt    Throat cancer Paternal Uncle    Liver cancer Maternal Grandmother        or other primary; d. late 2s   Diabetes Maternal Grandmother    Alcohol abuse Maternal Grandfather    Early death Maternal Grandfather    Stroke Paternal Grandmother    Heart disease Paternal Grandfather    Esophageal cancer Neg Hx    Stomach cancer Neg Hx    Colon cancer Neg Hx     Social History Social History   Tobacco Use   Smoking status: Former    Current packs/day: 0.00    Average packs/day: 1.5 packs/day for 41.0 years (61.5 ttl pk-yrs)    Types: Cigarettes     Start date: 60    Quit date: 2010    Years since quitting: 15.2   Smokeless tobacco: Never  Vaping Use   Vaping status: Never Used  Substance Use Topics   Alcohol use: Yes    Comment: wine occasionaly   Drug use: No   Allergies Patient has no known allergies.  Review of Systems Review of Systems  Respiratory:  Positive for cough, chest tightness, shortness of breath and wheezing.     Physical Exam Vital Signs  I have reviewed the triage vital signs BP (!) 109/53 (BP Location: Left Arm)   Pulse (!) 116   Temp 98 F (36.7 C) (Oral)   Resp (!) 23   Ht 5' (1.524 m)   Wt 60.8 kg   SpO2 95%   BMI 26.17 kg/m   Physical Exam Vitals and nursing note reviewed.  Constitutional:      General: She is not in acute distress.    Appearance: She is well-developed.  HENT:     Head: Normocephalic and atraumatic.  Eyes:     Conjunctiva/sclera: Conjunctivae normal.  Cardiovascular:     Rate and Rhythm: Normal rate and regular rhythm.     Heart sounds: No murmur heard. Pulmonary:     Effort: Respiratory distress present.     Breath sounds: Wheezing present.  Abdominal:     Palpations: Abdomen is soft.     Tenderness: There is no abdominal tenderness.  Musculoskeletal:        General: No swelling.     Cervical back: Neck supple.  Skin:    General: Skin  is warm and dry.     Capillary Refill: Capillary refill takes less than 2 seconds.  Neurological:     Mental Status: She is alert.  Psychiatric:        Mood and Affect: Mood normal.     ED Results and Treatments Labs (all labs ordered are listed, but only abnormal results are displayed) Labs Reviewed  COMPREHENSIVE METABOLIC PANEL - Abnormal; Notable for the following components:      Result Value   Chloride 96 (*)    Glucose, Bld 205 (*)    All other components within normal limits  CBC WITH DIFFERENTIAL/PLATELET - Abnormal; Notable for the following components:   Hemoglobin 10.9 (*)    HCT 34.4 (*)    Lymphs Abs  0.5 (*)    All other components within normal limits  BRAIN NATRIURETIC PEPTIDE - Abnormal; Notable for the following components:   B Natriuretic Peptide 306.0 (*)    All other components within normal limits  CBG MONITORING, ED - Abnormal; Notable for the following components:   Glucose-Capillary 232 (*)    All other components within normal limits  TROPONIN I (HIGH SENSITIVITY)                                                                                                                          Radiology DG Chest 2 View Result Date: 03/27/2023 CLINICAL DATA:  Dyspnea EXAM: CHEST - 2 VIEW COMPARISON:  03/24/2023 FINDINGS: Cardiac shadows within normal limits. Calcified granuloma is again seen in the right mid lung. Nodular density in the right upper lobe is noted similar to that seen on prior PET-CT. The known left lower lobe nodule is not as well appreciated on this exam. Bony structures are within normal limits. IMPRESSION: No acute abnormality noted. Electronically Signed   By: Alcide Clever M.D.   On: 03/27/2023 20:05    Pertinent labs & imaging results that were available during my care of the patient were reviewed by me and considered in my medical decision making (see MDM for details).  Medications Ordered in ED Medications  ipratropium-albuterol (DUONEB) 0.5-2.5 (3) MG/3ML nebulizer solution 9 mL (9 mLs Nebulization Given 03/27/23 1935)  methylPREDNISolone sodium succinate (SOLU-MEDROL) 125 mg/2 mL injection 125 mg (125 mg Intravenous Given 03/27/23 1949)  albuterol (PROVENTIL) (2.5 MG/3ML) 0.083% nebulizer solution (10 mg  Given 03/27/23 2118)  Procedures .Critical Care  Performed by: Glendora Score, MD Authorized by: Glendora Score, MD   Critical care provider statement:    Critical care time (minutes):  30   Critical care was necessary to treat  or prevent imminent or life-threatening deterioration of the following conditions:  Respiratory failure   Critical care was time spent personally by me on the following activities:  Development of treatment plan with patient or surrogate, discussions with consultants, evaluation of patient's response to treatment, examination of patient, ordering and review of laboratory studies, ordering and review of radiographic studies, ordering and performing treatments and interventions, pulse oximetry, re-evaluation of patient's condition and review of old charts   (including critical care time)  Medical Decision Making / ED Course   This patient presents to the ED for concern of shortness of breath, this involves an extensive number of treatment options, and is a complaint that carries with it a high risk of complications and morbidity.  The differential diagnosis includes Pe, PTX, Pulmonary Edema, ARDS, COPD/Asthma, ACS, CHF exacerbation, Arrhythmia, Pericardial Effusion/Tamponade, Anemia, Sepsis, Acidosis/Hypercapnia, Anxiety, Viral URI  MDM: Seen emergency room for evaluation of shortness of breath.  Physical exam with tachypnea, mild accessory muscle use and wheezing bilaterally.  Laboratory evaluation with a hemoglobin of 10.9, high-sensitivity troponin is negative, chest x-ray unremarkable.  ECG with sinus tachycardia.  Patient given 3 DuoNebs and steroids and on reevaluation her wheezing is persistent.  Patient then given a continuous albuterol treatment and on reevaluation she remains hypoxic and persistently wheezy.  O2 sats recovered with 2 L O2.  Will require hospital admission for COPD exacerbation and new oxygen requirement.   Additional history obtained: -Additional history obtained from husband -External records from outside source obtained and reviewed including: Chart review including previous notes, labs, imaging, consultation notes   Lab Tests: -I ordered, reviewed, and interpreted  labs.   The pertinent results include:   Labs Reviewed  COMPREHENSIVE METABOLIC PANEL - Abnormal; Notable for the following components:      Result Value   Chloride 96 (*)    Glucose, Bld 205 (*)    All other components within normal limits  CBC WITH DIFFERENTIAL/PLATELET - Abnormal; Notable for the following components:   Hemoglobin 10.9 (*)    HCT 34.4 (*)    Lymphs Abs 0.5 (*)    All other components within normal limits  BRAIN NATRIURETIC PEPTIDE - Abnormal; Notable for the following components:   B Natriuretic Peptide 306.0 (*)    All other components within normal limits  CBG MONITORING, ED - Abnormal; Notable for the following components:   Glucose-Capillary 232 (*)    All other components within normal limits  TROPONIN I (HIGH SENSITIVITY)      EKG   EKG Interpretation Date/Time:  Friday March 27 2023 19:10:48 EDT Ventricular Rate:  102 PR Interval:  131 QRS Duration:  100 QT Interval:  351 QTC Calculation: 458 R Axis:   70  Text Interpretation: Sinus tachycardia Confirmed by Ashling Roane ([id number]) on 03/27/2023 10:20:22 PM         Imaging Studies ordered: I ordered imaging studies including chest x-ray I independently visualized and interpreted imaging. I agree with the radiologist interpretation   Medicines ordered and prescription drug management: Meds ordered this encounter  Medications   ipratropium-albuterol (DUONEB) 0.5-2.5 (3) MG/3ML nebulizer solution 9 mL   methylPREDNISolone sodium succinate (SOLU-MEDROL) 125 mg/2 mL injection 125 mg   DISCONTD: albuterol (PROVENTIL,VENTOLIN) solution continuous neb   albuterol (PROVENTIL) (  2.5 MG/3ML) 0.083% nebulizer solution    Santiago Bur S: cabinet override    -I have reviewed the patients home medicines and have made adjustments as needed  Critical interventions Multiple DuoNebs, continuous albuterol, steroids   Cardiac Monitoring: The patient was maintained on a cardiac monitor.  I personally  viewed and interpreted the cardiac monitored which showed an underlying rhythm of: Sinus tachycardia  Social Determinants of Health:  Factors impacting patients care include: none   Reevaluation: After the interventions noted above, I reevaluated the patient and found that they have :stayed the same  Co morbidities that complicate the patient evaluation  Past Medical History:  Diagnosis Date   Complication of anesthesia    COPD (chronic obstructive pulmonary disease) (HCC)    Coronary artery disease    Diabetes mellitus without complication (HCC)    Diverticulosis    Early cataracts, bilateral 11/21/2016   Esophageal cancer (HCC)    GERD (gastroesophageal reflux disease)    Glaucoma    patient denies   Hyperlipidemia    Hypertension    Internal hemorrhoids    Osteoporosis 12/16/2021   Pneumonia    PONV (postoperative nausea and vomiting)    Sleep apnea    uses Cpap   Spasm of the cricopharyngeus muscle    Squamous cell esophageal cancer (HCC) 05/10/2018      Dispostion: I considered admission for this patient, and patient require hospital admission for COPD exacerbation with new oxygen requirement     Final Clinical Impression(s) / ED Diagnoses Final diagnoses:  COPD exacerbation (HCC)     @PCDICTATION @    Glendora Score, MD 03/27/23 2223

## 2023-03-27 NOTE — Transitions of Care (Post Inpatient/ED Visit) (Addendum)
 03/27/2023  Name: Erica Braun MRN: [id number] DOB: 1950-10-29  Today's TOC FU Call Status: Today's TOC FU Call Status:: Successful TOC FU Call Completed TOC FU Call Complete Date: 03/27/23 Patient's Name and Date of Birth confirmed.  Transition Care Management Follow-up Telephone Call Date of Discharge: 03/26/23 [facility] Type of Discharge: Inpatient Admission Primary Inpatient Discharge Diagnosis:: Acute respiratory failure with hypoxia How have you been since you were released from the hospital?: Better Any questions or concerns?: Yes Patient Questions/Concerns:: Oxygen saturation and SOB Patient Questions/Concerns Addressed: Notified Provider of Patient Questions/Concerns- Call paced to PCP regarding Oxygen levels , Elevated BG and consider [facility] referral   Items Reviewed: Did you receive and understand the discharge instructions provided?: Yes Medications obtained,verified, and reconciled?: Yes (Medications Reviewed) (Medication reconciliation completed based on recent discharge summary Patient taking medications as instructed and is aware of any changes or dosage adjustments medication regimen. Patient denies questions and reports no barriers to medication adherence) Any new allergies since your discharge?: No Dietary orders reviewed?: Yes Type of Diet Ordered:: Reg Heart Healthy Carb Modified Do you have support at home?: Yes People in Home: spouse Name of Support/Comfort Primary Source: Husband [name]  Medications Reviewed Today: Medications Reviewed Today     Reviewed by Johnnette Barrios, RN (Registered Nurse) on 03/27/23 at 1124  Med List Status: <None>   Medication Order Taking? Sig Documenting Provider Last Dose Status Informant  albuterol (PROVENTIL) (2.5 MG/3ML) 0.083% nebulizer solution [id number] Yes Take 3 mLs (2.5 mg total) by nebulization every 6 (six) hours as needed for wheezing or shortness of breath. Junie Spencer,  [name] Taking Active Self  aspirin EC 81 MG tablet [id number] Yes Take 81 mg by mouth in the morning. [provider] Taking Active Self  atenolol (TENORMIN) 25 MG tablet [id number] Yes Take 1 tablet (25 mg total) by mouth 2 (two) times daily. Azucena Fallen, MD Taking Active   calcium carbonate (OSCAL) 1500 (600 Ca) MG TABS tablet [id number] Yes Take 600 mg by mouth in the morning and at bedtime. [provider] Taking Active Self  Cholecalciferol (VITAMIN D-3 PO) [id number] Yes Take 5,000 Units by mouth in the morning. [provider] Taking Active Self  empagliflozin (JARDIANCE) 10 MG TABS tablet [id number] Yes Take 1 tablet (10 mg total) by mouth daily before breakfast. [name] A, [name] Taking Active   famotidine (PEPCID) 40 MG tablet [id number] Yes Take 1 tablet (40 mg total) by mouth at bedtime. Junie Spencer, [name] Taking Active   fluticasone-salmeterol (ADVAIR) 250-50 MCG/ACT AEPB [id number] Yes Inhale 1 puff into the lungs every 12 (twelve) hours. Coralyn Helling, MD Taking Active   glimepiride (AMARYL) 2 MG tablet [id number] No Take 1 tablet (2 mg total) by mouth daily as needed (blood sugar more than 200.).  Patient not taking: Reported on 03/27/2023   Junie Spencer, [name] Not Taking Active   isosorbide mononitrate (IMDUR) 30 MG 24 hr tablet [id number] Yes Take 0.5 tablets (15 mg total) by mouth daily. Azucena Fallen, MD Taking Active   metFORMIN (GLUCOPHAGE) 1000 MG tablet [id number] Yes Take 1 tablet (1,000 mg total) by mouth 2 (two) times daily with a meal. Hawks, Edilia Bo, [name] Taking Active   Multiple Vitamins-Minerals (ADULT ONE DAILY GUMMIES PO) [id number] Yes Take 2 tablets by mouth in the morning. Women's Multi by [employer] [provider] Taking Active Self  nitroGLYCERIN (NITROSTAT) 0.4 MG SL tablet [id number] Yes Place 1 tablet (  0.4 mg total) under the tongue every 5 (five) minutes as needed for chest pain. Wendall Stade, MD Taking Active    omeprazole (PRILOSEC) 40 MG capsule [id number] Yes Take 1 capsule (40 mg total) by mouth daily. Junie Spencer, [name] Taking Active   oseltamivir (TAMIFLU) 30 MG capsule [id number] Yes Take 1 capsule (30 mg total) by mouth 2 (two) times daily for 3 days. Azucena Fallen, MD Taking Active   predniSONE (DELTASONE) 10 MG tablet [id number] Yes Take 4 tablets (40 mg total) by mouth daily for 3 days, THEN 3 tablets (30 mg total) daily for 3 days, THEN 2 tablets (20 mg total) daily for 3 days, THEN 1 tablet (10 mg total) daily for 3 days. Azucena Fallen, MD Taking Active   rosuvastatin (CRESTOR) 10 MG tablet [id number] Yes Take 1 tablet by mouth once daily Wendall Stade, MD Taking Active           Medication reconciliation / review completed based on most recent discharge summary and EHR medication list. Confirmed patient is taking all newly prescribed medications as instructed (any discrepancies are noted in review section)   Patient / Caregiver is aware of any changes to and / or  any dosage adjustments to medication regimen. Patient/ Caregiver denies questions at this time and reports no barriers to medication adherence.  We discussed elevated BG , her steriod use and the need t ulilize her PRN anti-diabetic medications if her results are > 200   Medication changes and updates   START taking: oseltamivir (TAMIFLU) predniSONE (DELTASONE)   Home Care and Equipment/Supplies: Were Home Health Services Ordered?: No Any new equipment or medical supplies ordered?: No (She is questioning if she should have had home O2)  Functional Questionnaire: Do you need assistance with bathing/showering or dressing?: No Do you need assistance with meal preparation?: No Do you need assistance with eating?: No Do you have difficulty maintaining continence: No Do you need assistance with getting out of bed/getting out of a chair/moving?: No Do you have difficulty managing or taking your medications?:  No  Follow up appointments reviewed: PCP Follow-up appointment confirmed?: Yes Date of PCP follow-up appointment?: 03/30/23 Follow-up Provider: Junie Spencer, [name] Specialist Hospital Follow-up appointment confirmed?: Yes Date of Specialist follow-up appointment?: 04/27/23 Follow-Up Specialty Provider:: Cardiology 4/21, Pulmonology 2/22 Reason Specialist Follow-Up Not Confirmed: Patient has Specialist Provider Number and will Call for Appointment (Pulmonology) Do you need transportation to your follow-up appointment?: No Do you understand care options if your condition(s) worsen?: Yes-patient verbalized understanding  SDOH Interventions Today    Flowsheet Row Most Recent Value  SDOH Interventions   Food Insecurity Interventions Intervention Not Indicated  Housing Interventions Intervention Not Indicated  Transportation Interventions Intervention Not Indicated, Patient Resources (Friends/Family)  Utilities Interventions Intervention Not Indicated      Interventions Today    Flowsheet Row Most Recent Value  Chronic Disease   Chronic disease during today's visit Diabetes, Other  [Flu]  General Interventions   General Interventions Discussed/Reviewed General Interventions Discussed, General Interventions Reviewed, Sick Day Rules, Doctor Visits  Doctor Visits Discussed/Reviewed Doctor Visits Reviewed, Doctor Visits Discussed, PCP, Specialist  PCP/Specialist Visits Compliance with follow-up visit  Exercise Interventions   Exercise Discussed/Reviewed Exercise Discussed, Exercise Reviewed, Physical Activity  Physical Activity Discussed/Reviewed Physical Activity Discussed, Physical Activity Reviewed  Education Interventions   Education Provided Provided Education  Provided Verbal Education On Nutrition, Blood Sugar Monitoring, Medication, When to see the doctor  Nutrition Interventions  Nutrition Discussed/Reviewed Nutrition Discussed, Decreasing sugar intake, Carbohydrate meal  planning, Nutrition Reviewed, Decreasing fats  Pharmacy Interventions   Pharmacy Dicussed/Reviewed Pharmacy Topics Reviewed, Pharmacy Topics Discussed, Medications and their functions, Medication Adherence  Safety Interventions   Safety Discussed/Reviewed Safety Reviewed       Benefits reviewed  Based on current information and Insurance plan -Reviewed benefits accessible to patient, including details about eligibility options for care and  available value based care options  if any areas of needs were identified.  Reviewed patient/  caregiver's ability to access and / or  ability with navigating the benefits system..Amb Referral made if indicted , refer to orders section of note for details   Reviewed goals for care Patient  and / or Caregiveverbalizes understanding of instructions and care plan provided. Patient / Caregiver was encouraged to make informed decisions about their care, actively participate in managing their health condition, and implement lifestyle changes as needed to promote independence and self-management of health care. There were no reported  barriers to care.   TOC program  Patient is at  risk for readmission and / or has history of  high utilization  Discussed VBCI  TOC program and weekly calls to patient to assess condition/status, medication management  and provide support/education as indicated . Patient  and / or Caregive voiced understanding and declined enrollment in the 30-day TOC Program at this time .   Overall she is doing pretty well. She is concerned about her oxygen levels decreasing and feels she should have been sent home on Oxygen for a temporary basis. She also expressed concern with elevated BG levels although we reviewed her current Prednisone use which can contribute to hyperglycemia. She does have PRN medication she can use. She has already placed a call to PCP office and is waiting for a call back, her appointment is Monday 3/24  I placed a call to PCP  office spoke to Lyrik [place] and requested a F2F on Monday with a referral for SN Home Health  [facility] Nurse will make 1 additional follow-up call 3/25 and proceed as indicted with that follow-up   The Patient  and / or Caregiver  has been provided with contact information for the care management team and has been advised to call with any health-related questions or concerns. Patient was encouraged to Contact PCP with any questions or concerns regarding ongoing medical care, any difficulty obtaining or picking up prescriptions, any changes or worsening in condition including signs / symptoms not relieved  with interventions Patient had no additional questions or concerns at this time.    Susa Loffler , BSN, RN [facility]   VBCI-Population Health RN Care Manager Direct Dial [phone]  Fax: [phone] Website: [email or web address]

## 2023-03-27 NOTE — Telephone Encounter (Signed)
 Called pt back to complete triage. HR is 115 at rest . O2 sats are at best in the low 90's while at rest. Advised pt to return to ED for care. Called [facility] s/w Misty Stanley. Will send encounter to office.

## 2023-03-28 ENCOUNTER — Observation Stay (HOSPITAL_COMMUNITY)

## 2023-03-28 ENCOUNTER — Other Ambulatory Visit (HOSPITAL_COMMUNITY): Payer: Self-pay | Admitting: *Deleted

## 2023-03-28 DIAGNOSIS — I5031 Acute diastolic (congestive) heart failure: Secondary | ICD-10-CM

## 2023-03-28 DIAGNOSIS — J441 Chronic obstructive pulmonary disease with (acute) exacerbation: Secondary | ICD-10-CM | POA: Diagnosis not present

## 2023-03-28 DIAGNOSIS — M810 Age-related osteoporosis without current pathological fracture: Secondary | ICD-10-CM | POA: Diagnosis not present

## 2023-03-28 DIAGNOSIS — C159 Malignant neoplasm of esophagus, unspecified: Secondary | ICD-10-CM | POA: Diagnosis not present

## 2023-03-28 DIAGNOSIS — Z803 Family history of malignant neoplasm of breast: Secondary | ICD-10-CM | POA: Diagnosis not present

## 2023-03-28 DIAGNOSIS — J101 Influenza due to other identified influenza virus with other respiratory manifestations: Secondary | ICD-10-CM | POA: Diagnosis not present

## 2023-03-28 DIAGNOSIS — E1165 Type 2 diabetes mellitus with hyperglycemia: Secondary | ICD-10-CM | POA: Diagnosis not present

## 2023-03-28 DIAGNOSIS — Z800 Family history of malignant neoplasm of digestive organs: Secondary | ICD-10-CM | POA: Diagnosis not present

## 2023-03-28 DIAGNOSIS — R0600 Dyspnea, unspecified: Secondary | ICD-10-CM | POA: Diagnosis not present

## 2023-03-28 DIAGNOSIS — Z823 Family history of stroke: Secondary | ICD-10-CM | POA: Diagnosis not present

## 2023-03-28 DIAGNOSIS — Z7951 Long term (current) use of inhaled steroids: Secondary | ICD-10-CM | POA: Diagnosis not present

## 2023-03-28 DIAGNOSIS — Z8249 Family history of ischemic heart disease and other diseases of the circulatory system: Secondary | ICD-10-CM | POA: Diagnosis not present

## 2023-03-28 DIAGNOSIS — K219 Gastro-esophageal reflux disease without esophagitis: Secondary | ICD-10-CM | POA: Insufficient documentation

## 2023-03-28 DIAGNOSIS — Z813 Family history of other psychoactive substance abuse and dependence: Secondary | ICD-10-CM | POA: Diagnosis not present

## 2023-03-28 DIAGNOSIS — Z833 Family history of diabetes mellitus: Secondary | ICD-10-CM | POA: Diagnosis not present

## 2023-03-28 DIAGNOSIS — Z811 Family history of alcohol abuse and dependence: Secondary | ICD-10-CM | POA: Diagnosis not present

## 2023-03-28 DIAGNOSIS — J449 Chronic obstructive pulmonary disease, unspecified: Secondary | ICD-10-CM | POA: Diagnosis not present

## 2023-03-28 DIAGNOSIS — E782 Mixed hyperlipidemia: Secondary | ICD-10-CM | POA: Diagnosis not present

## 2023-03-28 DIAGNOSIS — Z7984 Long term (current) use of oral hypoglycemic drugs: Secondary | ICD-10-CM | POA: Diagnosis not present

## 2023-03-28 DIAGNOSIS — J9601 Acute respiratory failure with hypoxia: Secondary | ICD-10-CM | POA: Diagnosis not present

## 2023-03-28 DIAGNOSIS — I10 Essential (primary) hypertension: Secondary | ICD-10-CM | POA: Diagnosis not present

## 2023-03-28 DIAGNOSIS — R7989 Other specified abnormal findings of blood chemistry: Secondary | ICD-10-CM | POA: Insufficient documentation

## 2023-03-28 DIAGNOSIS — Z87891 Personal history of nicotine dependence: Secondary | ICD-10-CM | POA: Diagnosis not present

## 2023-03-28 DIAGNOSIS — I2510 Atherosclerotic heart disease of native coronary artery without angina pectoris: Secondary | ICD-10-CM | POA: Diagnosis not present

## 2023-03-28 DIAGNOSIS — G4733 Obstructive sleep apnea (adult) (pediatric): Secondary | ICD-10-CM | POA: Diagnosis not present

## 2023-03-28 DIAGNOSIS — Z808 Family history of malignant neoplasm of other organs or systems: Secondary | ICD-10-CM | POA: Diagnosis not present

## 2023-03-28 DIAGNOSIS — Z1152 Encounter for screening for COVID-19: Secondary | ICD-10-CM | POA: Diagnosis not present

## 2023-03-28 DIAGNOSIS — Z825 Family history of asthma and other chronic lower respiratory diseases: Secondary | ICD-10-CM | POA: Diagnosis not present

## 2023-03-28 LAB — COMPREHENSIVE METABOLIC PANEL
ALT: 13 U/L (ref 0–44)
AST: 13 U/L — ABNORMAL LOW (ref 15–41)
Albumin: 3.3 g/dL — ABNORMAL LOW (ref 3.5–5.0)
Alkaline Phosphatase: 57 U/L (ref 38–126)
Anion gap: 10 (ref 5–15)
BUN: 25 mg/dL — ABNORMAL HIGH (ref 8–23)
CO2: 28 mmol/L (ref 22–32)
Calcium: 9.6 mg/dL (ref 8.9–10.3)
Chloride: 99 mmol/L (ref 98–111)
Creatinine, Ser: 0.72 mg/dL (ref 0.44–1.00)
GFR, Estimated: >60 mL/min (ref >60)
Glucose, Bld: 201 mg/dL — ABNORMAL HIGH (ref 70–99)
Potassium: 5 mmol/L (ref 3.5–5.1)
Sodium: 137 mmol/L (ref 135–145)
Total Bilirubin: 0.6 mg/dL (ref 0.0–1.2)
Total Protein: 6.7 g/dL (ref 6.5–8.1)

## 2023-03-28 LAB — CBC
HCT: 32.7 % — ABNORMAL LOW (ref 36.0–46.0)
Hemoglobin: 10 g/dL — ABNORMAL LOW (ref 12.0–15.0)
MCH: 26.5 pg (ref 26.0–34.0)
MCHC: 30.6 g/dL (ref 30.0–36.0)
MCV: 86.7 fL (ref 80.0–100.0)
Platelets: 136 10*3/uL — ABNORMAL LOW (ref 150–400)
RBC: 3.77 MIL/uL — ABNORMAL LOW (ref 3.87–5.11)
RDW: 14.9 % (ref 11.5–15.5)
WBC: 3.1 10*3/uL — ABNORMAL LOW (ref 4.0–10.5)
nRBC: 0 % (ref 0.0–0.2)

## 2023-03-28 LAB — ECHOCARDIOGRAM COMPLETE
Area-P 1/2: 2.42 cm2
Height: 60 in
S' Lateral: 2.2 cm
Weight: 2144 [oz_av]

## 2023-03-28 LAB — PROCALCITONIN: Procalcitonin: <0.1 ng/mL

## 2023-03-28 LAB — PHOSPHORUS: Phosphorus: 5 mg/dL — ABNORMAL HIGH (ref 2.5–4.6)

## 2023-03-28 LAB — HEMOGLOBIN A1C
Hgb A1c MFr Bld: 7.7 % — ABNORMAL HIGH (ref 4.8–5.6)
Mean Plasma Glucose: 174.29 mg/dL

## 2023-03-28 LAB — GLUCOSE, CAPILLARY
Glucose-Capillary: 156 mg/dL — ABNORMAL HIGH (ref 70–99)
Glucose-Capillary: 244 mg/dL — ABNORMAL HIGH (ref 70–99)

## 2023-03-28 LAB — TSH: TSH: 0.416 u[IU]/mL (ref 0.350–4.500)

## 2023-03-28 LAB — VITAMIN B12: Vitamin B-12: 510 pg/mL (ref 180–914)

## 2023-03-28 LAB — CBG MONITORING, ED
Glucose-Capillary: 174 mg/dL — ABNORMAL HIGH (ref 70–99)
Glucose-Capillary: 178 mg/dL — ABNORMAL HIGH (ref 70–99)

## 2023-03-28 LAB — MAGNESIUM: Magnesium: 2.3 mg/dL (ref 1.7–2.4)

## 2023-03-28 LAB — FOLATE: Folate: 18 ng/mL (ref >5.9)

## 2023-03-28 MED ORDER — REVEFENACIN 175 MCG/3ML IN SOLN
175.0000 ug | Freq: Every day | RESPIRATORY_TRACT | Status: DC
  Administered 2023-03-28 – 2023-03-30 (×3): 175 ug via RESPIRATORY_TRACT
  Filled 2023-03-28 (×5): qty 3

## 2023-03-28 MED ORDER — ROSUVASTATIN CALCIUM 10 MG PO TABS
10.0000 mg | ORAL_TABLET | Freq: Every day | ORAL | Status: DC
  Administered 2023-03-28 – 2023-03-30 (×3): 10 mg via ORAL
  Filled 2023-03-28 (×3): qty 1

## 2023-03-28 MED ORDER — PANTOPRAZOLE SODIUM 40 MG PO TBEC
40.0000 mg | DELAYED_RELEASE_TABLET | Freq: Every day | ORAL | Status: DC
  Administered 2023-03-28 – 2023-03-30 (×3): 40 mg via ORAL
  Filled 2023-03-28 (×3): qty 1

## 2023-03-28 MED ORDER — BUDESONIDE 0.5 MG/2ML IN SUSP
0.5000 mg | Freq: Two times a day (BID) | RESPIRATORY_TRACT | Status: DC
  Administered 2023-03-28 – 2023-03-30 (×5): 0.5 mg via RESPIRATORY_TRACT
  Filled 2023-03-28 (×5): qty 2

## 2023-03-28 MED ORDER — DM-GUAIFENESIN ER 30-600 MG PO TB12
1.0000 | ORAL_TABLET | Freq: Two times a day (BID) | ORAL | Status: DC
  Administered 2023-03-28 – 2023-03-30 (×5): 1 via ORAL
  Filled 2023-03-28 (×5): qty 1

## 2023-03-28 MED ORDER — AZITHROMYCIN 250 MG PO TABS
500.0000 mg | ORAL_TABLET | Freq: Every day | ORAL | Status: AC
  Administered 2023-03-28: 500 mg via ORAL
  Filled 2023-03-28: qty 2

## 2023-03-28 MED ORDER — INSULIN ASPART 100 UNIT/ML IJ SOLN
0.0000 [IU] | Freq: Every day | INTRAMUSCULAR | Status: DC
  Administered 2023-03-28 – 2023-03-29 (×2): 2 [IU] via SUBCUTANEOUS

## 2023-03-28 MED ORDER — ATENOLOL 25 MG PO TABS
12.5000 mg | ORAL_TABLET | Freq: Two times a day (BID) | ORAL | Status: DC
  Administered 2023-03-28 – 2023-03-30 (×5): 12.5 mg via ORAL
  Filled 2023-03-28 (×5): qty 1

## 2023-03-28 MED ORDER — ACETAMINOPHEN 325 MG PO TABS
650.0000 mg | ORAL_TABLET | Freq: Four times a day (QID) | ORAL | Status: DC | PRN
  Administered 2023-03-29: 650 mg via ORAL
  Filled 2023-03-28: qty 2

## 2023-03-28 MED ORDER — ASPIRIN 81 MG PO TBEC
81.0000 mg | DELAYED_RELEASE_TABLET | Freq: Every day | ORAL | Status: DC
  Administered 2023-03-28 – 2023-03-30 (×3): 81 mg via ORAL
  Filled 2023-03-28 (×3): qty 1

## 2023-03-28 MED ORDER — OSELTAMIVIR PHOSPHATE 30 MG PO CAPS
30.0000 mg | ORAL_CAPSULE | Freq: Two times a day (BID) | ORAL | Status: DC
Start: 2023-03-28 — End: 2023-04-02
  Administered 2023-03-28 – 2023-03-30 (×5): 30 mg via ORAL
  Filled 2023-03-28 (×5): qty 1

## 2023-03-28 MED ORDER — INSULIN ASPART 100 UNIT/ML IJ SOLN
0.0000 [IU] | Freq: Three times a day (TID) | INTRAMUSCULAR | Status: DC
  Administered 2023-03-28: 3 [IU] via SUBCUTANEOUS
  Filled 2023-03-28: qty 1

## 2023-03-28 MED ORDER — GUAIFENESIN-DM 100-10 MG/5ML PO SYRP: 5.0000 mL | ORAL_SOLUTION | ORAL | Status: DC | PRN

## 2023-03-28 MED ORDER — METHYLPREDNISOLONE SODIUM SUCC 40 MG IJ SOLR
40.0000 mg | Freq: Two times a day (BID) | INTRAMUSCULAR | Status: DC
  Administered 2023-03-28 – 2023-03-30 (×5): 40 mg via INTRAVENOUS
  Filled 2023-03-28 (×5): qty 1

## 2023-03-28 MED ORDER — ONDANSETRON HCL 4 MG PO TABS: 4.0000 mg | ORAL_TABLET | Freq: Four times a day (QID) | ORAL | Status: DC | PRN

## 2023-03-28 MED ORDER — ALBUTEROL SULFATE (2.5 MG/3ML) 0.083% IN NEBU
2.5000 mg | INHALATION_SOLUTION | Freq: Four times a day (QID) | RESPIRATORY_TRACT | Status: DC
  Administered 2023-03-28 – 2023-03-29 (×6): 2.5 mg via RESPIRATORY_TRACT
  Filled 2023-03-28 (×6): qty 3

## 2023-03-28 MED ORDER — ONDANSETRON HCL 4 MG/2ML IJ SOLN: 4.0000 mg | Freq: Four times a day (QID) | INTRAMUSCULAR | Status: DC | PRN

## 2023-03-28 MED ORDER — SODIUM CHLORIDE 0.9 % IV BOLUS
1000.0000 mL | Freq: Once | INTRAVENOUS | Status: AC
  Administered 2023-03-28: 1000 mL via INTRAVENOUS

## 2023-03-28 MED ORDER — INSULIN ASPART 100 UNIT/ML IJ SOLN
0.0000 [IU] | Freq: Three times a day (TID) | INTRAMUSCULAR | Status: DC
  Administered 2023-03-28 – 2023-03-30 (×6): 2 [IU] via SUBCUTANEOUS
  Filled 2023-03-28: qty 1

## 2023-03-28 MED ORDER — ENOXAPARIN SODIUM 40 MG/0.4ML IJ SOSY
40.0000 mg | PREFILLED_SYRINGE | INTRAMUSCULAR | Status: DC
  Administered 2023-03-29 – 2023-03-30 (×2): 40 mg via SUBCUTANEOUS
  Filled 2023-03-28 (×3): qty 0.4

## 2023-03-28 MED ORDER — ACETAMINOPHEN 650 MG RE SUPP: 650.0000 mg | Freq: Four times a day (QID) | RECTAL | Status: DC | PRN

## 2023-03-28 MED ORDER — AZITHROMYCIN 250 MG PO TABS
250.0000 mg | ORAL_TABLET | Freq: Every day | ORAL | Status: DC
  Administered 2023-03-29 – 2023-03-30 (×2): 250 mg via ORAL
  Filled 2023-03-28 (×2): qty 1

## 2023-03-28 MED ORDER — IPRATROPIUM-ALBUTEROL 0.5-2.5 (3) MG/3ML IN SOLN: 3.0000 mL | Freq: Four times a day (QID) | RESPIRATORY_TRACT | Status: DC

## 2023-03-28 NOTE — Progress Notes (Signed)
*  PRELIMINARY RESULTS* Echocardiogram 2D Echocardiogram has been performed.  Stacey Drain 03/28/2023, 9:47 AM

## 2023-03-28 NOTE — Care Management Obs Status (Signed)
 MEDICARE OBSERVATION STATUS NOTIFICATION   Patient Details  Name: Denetra Formoso MRN: [id number] Date of Birth: 10/06/1950   Medicare Observation Status Notification Given:  Yes    Anabel Halon, RN 03/28/2023, 11:23 AM

## 2023-03-28 NOTE — Hospital Course (Signed)
 73 year old female with a history of coronary disease, COPD, hypertension, hyperlipidemia, diabetes mellitus type 2, GERD, esophageal squamous cell carcinoma (had surgery and tx at [facility]) presenting with shortness of breath.  The patient was recently hospitalized from 03/24/23 to 03/26/23 for acute respiratory failure and COPD exacerbation secondary to influenza.  The patient was discharged home on room air with 3 more days of oseltamavir.  She was sent home with a prednisone taper.  The patient states that after she took a shower on the evening that she returned home she noticed her oxygen saturation was in the 80s.  She also noted that she was tachycardic into the 110s.  The next morning, on 03/27/2023, the patient once again had some tachycardia with exertion and shortness of breath.  Her oxygen saturation remained in the 80s at home.  She was instructed to go to emergency department by her PCP.  Patient denies fevers, chills, headache, neck pain, chest pain, abdominal pain.  She has nonproductive cough.  No hemoptysis.  She denies any diarrhea, hematochezia or melena. She quit smoking 15 years ago after about 70-pack-year history. In the ED, the patient was afebrile and hemodynamically stable.  She was initially tachycardic into the 120s.  Oxygen saturation was 97% 2 L.  Previously 3.1, hemoglobin 10.0, platelets 136.  Sodium 137, potassium 5.0, bicarbonate 20, serum creatinine 0.72.  LFTs unremarkable.  Chest x-ray showed hyperinflation without any consolidations.  The patient was started bronchodilators and Solu-Medrol.

## 2023-03-28 NOTE — TOC CM/SW Note (Signed)
 Transition of Care [facility]) - Inpatient Brief Assessment   Patient Details  Name: Erica Braun MRN: [id number] Date of Birth: January 22, 1950  [facility] CM/SW Contact:    Isabella Bowens, LCSWA Phone Number: 03/28/2023, 7:15 AM   Clinical Narrative:  Transition of Care Department [facility]) has reviewed patient and no other TOC needs have been identified at this time. We will continue to monitor patient advancement through interdisciplinary progression rounds. If new patient transition needs arise, please place a TOC consult.    Transition of Care Asessment: Insurance and Status: Insurance coverage has been reviewed Patient has primary care physician: Yes Home environment has been reviewed: Single Family Home with husband Prior level of function:: Independent Prior/Current Home Services: No current home services Social Drivers of Health Review: SDOH reviewed no interventions necessary Readmission risk has been reviewed: Yes Transition of care needs: no transition of care needs at this time

## 2023-03-28 NOTE — Progress Notes (Signed)
 PROGRESS NOTE  Erica Braun:[id number] DOB: 09-12-1950 DOA: 03/27/2023 PCP: Junie Spencer, [name]  Brief History:  73 year old female with a history of coronary disease, COPD, hypertension, hyperlipidemia, diabetes mellitus type 2, GERD, esophageal squamous cell carcinoma (had surgery and tx at [facility]) presenting with shortness of breath.  The patient was recently hospitalized from 03/24/23 to 03/26/23 for acute respiratory failure and COPD exacerbation secondary to influenza.  The patient was discharged home on room air with 3 more days of oseltamavir.  She was sent home with a prednisone taper.  The patient states that after she took a shower on the evening that she returned home she noticed her oxygen saturation was in the 80s.  She also noted that she was tachycardic into the 110s.  The next morning, on 03/27/2023, the patient once again had some tachycardia with exertion and shortness of breath.  Her oxygen saturation remained in the 80s at home.  She was instructed to go to emergency department by her PCP.  Patient denies fevers, chills, headache, neck pain, chest pain, abdominal pain.  She has nonproductive cough.  No hemoptysis.  She denies any diarrhea, hematochezia or melena. She quit smoking 15 years ago after about 70-pack-year history. In the ED, the patient was afebrile and hemodynamically stable.  She was initially tachycardic into the 120s.  Oxygen saturation was 97% 2 L.  Previously 3.1, hemoglobin 10.0, platelets 136.  Sodium 137, potassium 5.0, bicarbonate 20, serum creatinine 0.72.  LFTs unremarkable.  Chest x-ray showed hyperinflation without any consolidations.  The patient was started bronchodilators and Solu-Medrol.   Assessment/Plan:  Acute respiratory failure with hypoxia -Secondary to COPD exacerbation in the setting of influenza -Stable on 2 L -Wean oxygen as tolerated  COPD exacerbation -Start Pulmicort -Start Franklin -start Yupelri -Continue IV  Solu-Medrol  Influenza A -Continue oseltamavir  Diabetes mellitus type 2 with hyperglycemia -04/14/2022 hemoglobin A1c 7.2 -Holding oral hypoglycemics--Jardiance, Amaryl, metformin -NovoLog sliding scale -Repeat A1c  Essential hypertension -Continue atenolol  Coronary artery disease -Continue Imdur -Continue statin  Esophageal squamous cell carcinoma -03/12/2023 EGD--esophageal mucosa.,  1 to 2 cm hiatus hernia, normal stomach and duodenum--pathology negative for metaplasia -Continue pantoprazole  Hyperlipidemia -Continue statin  Lung nodules -Recently had a PET scan with low-grade activity -Patient has follow-up with pulmonary         Family Communication:   Family at bedside  Consultants:  none  Code Status:  FULL  DVT Prophylaxis:  Kleberg Lovenox   Procedures: As Listed in Progress Note Above  Antibiotics: None      Subjective: Patient continues to have nonproductive cough and some shortness of breath.  She denies any chest pain, abdominal pain, nausea, vomiting, diarrhea, hematochezia, melena.  There is no hemoptysis.  No fevers or chills.  Objective: Vitals:   03/28/23 0630 03/28/23 0645 03/28/23 0800 03/28/23 0801  BP: (!) 145/69 127/61 130/63   Pulse: 91 70 94   Resp: (!) 21 18 19    Temp:    97.6 F (36.4 C)  TempSrc:    Oral  SpO2: 98% 100% 97%   Weight:      Height:       No intake or output data in the 24 hours ending 03/28/23 0807 Weight change:  Exam:  General:  Pt is alert, follows commands appropriately, not in acute distress HEENT: No icterus, No thrush, No neck mass, [facility]/AT Cardiovascular: RRR, S1/S2, no rubs, no gallops Respiratory: Diminished  breath sounds bilateral.  Bibasilar wheeze.  Bibasilar rales. Abdomen: Soft/+BS, non tender, non distended, no guarding Extremities: No edema, No lymphangitis, No petechiae, No rashes, no synovitis   Data Reviewed: I have personally reviewed following labs and imaging studies Basic  Metabolic Panel: Recent Labs  Lab 03/24/23 1218 03/25/23 0411 03/27/23 1909 03/28/23 0541  NA 132* 137 136 137  K 3.9 4.0 5.1 5.0  CL 95* 101 96* 99  CO2 26 26 29 28   GLUCOSE 200* 160* 205* 201*  BUN 19 24* 23 25*  CREATININE 0.70 0.62 0.89 0.72  CALCIUM 9.1 8.9 9.4 9.6  MG 2.2  --   --  2.3  PHOS  --   --   --  5.0*   Liver Function Tests: Recent Labs  Lab 03/24/23 1218 03/27/23 1909 03/28/23 0541  AST 15 19 13*  ALT 14 13 13   ALKPHOS 76 65 57  BILITOT 0.5 0.5 0.6  PROT 7.5 7.5 6.7  ALBUMIN 3.6 3.6 3.3*   No results for input(s): "LIPASE", "AMYLASE" in the last 168 hours. No results for input(s): "AMMONIA" in the last 168 hours. Coagulation Profile: Recent Labs  Lab 03/24/23 1218  INR 1.0   CBC: Recent Labs  Lab 03/24/23 1218 03/25/23 0411 03/27/23 1909 03/28/23 0541  WBC 4.6 3.9* 4.5 3.1*  NEUTROABS 3.9  --  3.7  --   HGB 10.9* 9.7* 10.9* 10.0*  HCT 35.0* 32.2* 34.4* 32.7*  MCV 85.4 87.0 85.4 86.7  PLT 110* 108* 157 136*   Cardiac Enzymes: No results for input(s): "CKTOTAL", "CKMB", "CKMBINDEX", "TROPONINI" in the last 168 hours. BNP: Invalid input(s): "POCBNP" CBG: Recent Labs  Lab 03/27/23 1856 03/28/23 0751  GLUCAP 232* 174*   HbA1C: No results for input(s): "HGBA1C" in the last 72 hours. Urine analysis:    Component Value Date/Time   COLORURINE YELLOW 11/21/2016 1012   APPEARANCEUR Clear 03/28/2022 1413   LABSPEC 1.015 08/31/2017 1124   PHURINE 7.0 08/31/2017 1124   GLUCOSEU 3+ (A) 03/28/2022 1413   HGBUR NEGATIVE 08/31/2017 1124   BILIRUBINUR Negative 03/28/2022 1413   KETONESUR NEGATIVE 08/31/2017 1124   PROTEINUR Negative 03/28/2022 1413   PROTEINUR NEGATIVE 08/31/2017 1124   UROBILINOGEN 1.0 08/31/2017 1124   NITRITE Negative 03/28/2022 1413   NITRITE NEGATIVE 08/31/2017 1124   LEUKOCYTESUR Trace (A) 03/28/2022 1413   Sepsis Labs: @LABRCNTIP (procalcitonin:4,lacticidven:4) ) Recent Results (from the past 240 hours)  Resp  panel by RT-PCR (RSV, Flu A&B, Covid) Anterior Nasal Swab     Status: Abnormal   Collection Time: 03/24/23 12:18 PM   Specimen: Anterior Nasal Swab  Result Value Ref Range Status   SARS Coronavirus 2 by RT PCR NEGATIVE NEGATIVE Final    Comment: (NOTE) SARS-CoV-2 target nucleic acids are NOT DETECTED.  The SARS-CoV-2 RNA is generally detectable in upper respiratory specimens during the acute phase of infection. The lowest concentration of SARS-CoV-2 viral copies this assay can detect is 138 copies/mL. A negative result does not preclude SARS-Cov-2 infection and should not be used as the sole basis for treatment or other patient management decisions. A negative result may occur with  improper specimen collection/handling, submission of specimen other than nasopharyngeal swab, presence of viral mutation(s) within the areas targeted by this assay, and inadequate number of viral copies(<138 copies/mL). A negative result must be combined with clinical observations, patient history, and epidemiological information. The expected result is Negative.  Fact Sheet for Patients:  BloggerCourse.com  Fact Sheet for Healthcare Providers:  SeriousBroker.it  This test is no t yet approved or cleared by the [place] and  has been authorized for detection and/or diagnosis of SARS-CoV-2 by FDA under an Emergency Use Authorization (EUA). This EUA will remain  in effect (meaning this test can be used) for the duration of the COVID-19 declaration under Section 564(b)(1) of the Act, 21 U.S.C.section 360bbb-3(b)(1), unless the authorization is terminated  or revoked sooner.       Influenza A by PCR POSITIVE (A) NEGATIVE Final   Influenza B by PCR NEGATIVE NEGATIVE Final    Comment: (NOTE) The Xpert Xpress SARS-CoV-2/FLU/RSV plus assay is intended as an aid in the diagnosis of influenza from Nasopharyngeal swab specimens and should not be used  as a sole basis for treatment. Nasal washings and aspirates are unacceptable for Xpert Xpress SARS-CoV-2/FLU/RSV testing.  Fact Sheet for Patients: BloggerCourse.com  Fact Sheet for Healthcare Providers: SeriousBroker.it  This test is not yet approved or cleared by the [place] FDA and has been authorized for detection and/or diagnosis of SARS-CoV-2 by FDA under an Emergency Use Authorization (EUA). This EUA will remain in effect (meaning this test can be used) for the duration of the COVID-19 declaration under Section 564(b)(1) of the Act, 21 U.S.C. section 360bbb-3(b)(1), unless the authorization is terminated or revoked.     Resp Syncytial Virus by PCR NEGATIVE NEGATIVE Final    Comment: (NOTE) Fact Sheet for Patients: BloggerCourse.com  Fact Sheet for Healthcare Providers: SeriousBroker.it  This test is not yet approved or cleared by the [place] FDA and has been authorized for detection and/or diagnosis of SARS-CoV-2 by FDA under an Emergency Use Authorization (EUA). This EUA will remain in effect (meaning this test can be used) for the duration of the COVID-19 declaration under Section 564(b)(1) of the Act, 21 U.S.C. section 360bbb-3(b)(1), unless the authorization is terminated or revoked.  Performed at [facility], [address], [place], [place] [address]   Blood Culture (routine x 2)     Status: None (Preliminary result)   Collection Time: 03/24/23 12:18 PM   Specimen: BLOOD  Result Value Ref Range Status   Specimen Description BLOOD BLOOD LEFT ARM  Final   Special Requests   Final    Blood Culture results may not be optimal due to an inadequate volume of blood received in culture bottles BOTTLES DRAWN AEROBIC AND ANAEROBIC   Culture   Final    NO GROWTH 4 DAYS Performed at [facility], [address] [place], [place] [address]    Report Status  PENDING  Incomplete  Blood Culture (routine x 2)     Status: None (Preliminary result)   Collection Time: 03/24/23 12:18 PM   Specimen: BLOOD  Result Value Ref Range Status   Specimen Description BLOOD rfoa  Final   Special Requests   Final    BOTTLES DRAWN AEROBIC AND ANAEROBIC Blood Culture results may not be optimal due to an inadequate volume of blood received in culture bottles   Culture   Final    NO GROWTH 4 DAYS Performed at [facility], [address], [place], [place] [address]    Report Status PENDING  Incomplete     Scheduled Meds:  albuterol  2.5 mg Nebulization Q6H   azithromycin  500 mg Oral Daily   Followed by   Melene Muller ON 03/29/2023] azithromycin  250 mg Oral Daily   budesonide (PULMICORT) nebulizer solution  0.5 mg Nebulization BID   dextromethorphan-guaiFENesin  1 tablet Oral BID  enoxaparin (LOVENOX) injection  40 mg Subcutaneous Q24H   insulin aspart  0-15 Units Subcutaneous TID WC   methylPREDNISolone (SOLU-MEDROL) injection  40 mg Intravenous Q12H   oseltamivir  30 mg Oral BID   pantoprazole  40 mg Oral Daily   revefenacin  175 mcg Nebulization Daily   rosuvastatin  10 mg Oral Daily   Continuous Infusions:  Procedures/Studies: DG Chest 2 View Result Date: 03/27/2023 CLINICAL DATA:  Dyspnea EXAM: CHEST - 2 VIEW COMPARISON:  03/24/2023 FINDINGS: Cardiac shadows within normal limits. Calcified granuloma is again seen in the right mid lung. Nodular density in the right upper lobe is noted similar to that seen on prior PET-CT. The known left lower lobe nodule is not as well appreciated on this exam. Bony structures are within normal limits. IMPRESSION: No acute abnormality noted. Electronically Signed   By: Alcide Clever M.D.   On: 03/27/2023 20:05   DG Chest Port 1 View Result Date: 03/24/2023 CLINICAL DATA:  Sepsis. History of squamous cell carcinoma of the esophagus. EXAM: PORTABLE CHEST 1 VIEW COMPARISON:  X-ray 11/19/2020 and older.  PET-CT 01/16/2023.  FINDINGS: No consolidation, pneumothorax or effusion. Normal cardiopericardial silhouette without edema. Slight curvature of the spine. Dense right midlung nodule identified consistent with likely calcified and old granulomatous disease. Please correlate with recent cross-sectional imaging study. IMPRESSION: No acute cardiopulmonary disease. Electronically Signed   By: Karen Kays M.D.   On: 03/24/2023 13:58    Catarina Hartshorn, DO  [facility]  If 7PM-7AM, please contact night-coverage [email or web address] Password [email or web address] 03/28/2023, 8:07 AM   LOS: 0 days

## 2023-03-29 DIAGNOSIS — J441 Chronic obstructive pulmonary disease with (acute) exacerbation: Secondary | ICD-10-CM | POA: Diagnosis not present

## 2023-03-29 DIAGNOSIS — J101 Influenza due to other identified influenza virus with other respiratory manifestations: Secondary | ICD-10-CM | POA: Diagnosis not present

## 2023-03-29 DIAGNOSIS — J9601 Acute respiratory failure with hypoxia: Secondary | ICD-10-CM | POA: Diagnosis not present

## 2023-03-29 LAB — CULTURE, BLOOD (ROUTINE X 2)
Culture: NO GROWTH
Culture: NO GROWTH

## 2023-03-29 LAB — GLUCOSE, CAPILLARY
Glucose-Capillary: 178 mg/dL — ABNORMAL HIGH (ref 70–99)
Glucose-Capillary: 171 mg/dL — ABNORMAL HIGH (ref 70–99)
Glucose-Capillary: 159 mg/dL — ABNORMAL HIGH (ref 70–99)
Glucose-Capillary: 243 mg/dL — ABNORMAL HIGH (ref 70–99)

## 2023-03-29 LAB — BASIC METABOLIC PANEL
Anion gap: 6 (ref 5–15)
BUN: 26 mg/dL — ABNORMAL HIGH (ref 8–23)
CO2: 27 mmol/L (ref 22–32)
Calcium: 8.9 mg/dL (ref 8.9–10.3)
Chloride: 102 mmol/L (ref 98–111)
Creatinine, Ser: 0.64 mg/dL (ref 0.44–1.00)
GFR, Estimated: >60 mL/min (ref >60)
Glucose, Bld: 134 mg/dL — ABNORMAL HIGH (ref 70–99)
Potassium: 4.2 mmol/L (ref 3.5–5.1)
Sodium: 135 mmol/L (ref 135–145)

## 2023-03-29 LAB — MAGNESIUM: Magnesium: 2.1 mg/dL (ref 1.7–2.4)

## 2023-03-29 MED ORDER — ISOSORBIDE MONONITRATE ER 30 MG PO TB24
15.0000 mg | ORAL_TABLET | Freq: Every day | ORAL | Status: DC
  Administered 2023-03-29 – 2023-03-30 (×2): 15 mg via ORAL
  Filled 2023-03-29 (×2): qty 1

## 2023-03-29 MED ORDER — ARFORMOTEROL TARTRATE 15 MCG/2ML IN NEBU
15.0000 ug | INHALATION_SOLUTION | Freq: Two times a day (BID) | RESPIRATORY_TRACT | Status: DC
Start: 2023-03-29 — End: 2023-03-30
  Administered 2023-03-29 – 2023-03-30 (×2): 15 ug via RESPIRATORY_TRACT
  Filled 2023-03-29 (×2): qty 2

## 2023-03-29 NOTE — Plan of Care (Signed)

## 2023-03-29 NOTE — Progress Notes (Signed)
 PROGRESS NOTE  Erica Braun QMV:[id number] DOB: 03-12-1950 DOA: 03/27/2023 PCP: Junie Spencer, [name]  Brief History:  73 year old female with a history of coronary disease, COPD, hypertension, hyperlipidemia, diabetes mellitus type 2, GERD, esophageal squamous cell carcinoma (had surgery and tx at [facility]) presenting with shortness of breath.  The patient was recently hospitalized from 03/24/23 to 03/26/23 for acute respiratory failure and COPD exacerbation secondary to influenza.  The patient was discharged home on room air with 3 more days of oseltamavir.  She was sent home with a prednisone taper.  The patient states that after she took a shower on the evening that she returned home she noticed her oxygen saturation was in the 80s.  She also noted that she was tachycardic into the 110s.  The next morning, on 03/27/2023, the patient once again had some tachycardia with exertion and shortness of breath.  Her oxygen saturation remained in the 80s at home.  She was instructed to go to emergency department by her PCP.  Patient denies fevers, chills, headache, neck pain, chest pain, abdominal pain.  She has nonproductive cough.  No hemoptysis.  She denies any diarrhea, hematochezia or melena. She quit smoking 15 years ago after about 70-pack-year history. In the ED, the patient was afebrile and hemodynamically stable.  She was initially tachycardic into the 120s.  Oxygen saturation was 97% 2 L.  Previously 3.1, hemoglobin 10.0, platelets 136.  Sodium 137, potassium 5.0, bicarbonate 20, serum creatinine 0.72.  LFTs unremarkable.  Chest x-ray showed hyperinflation without any consolidations.  The patient was started bronchodilators and Solu-Medrol.   Assessment/Plan: Acute respiratory failure with hypoxia -Secondary to COPD exacerbation in the setting of influenza -Stable on 2 L -Wean oxygen as tolerated   COPD exacerbation -Start Pulmicort -Start Brovana -start Yupelri -Continue IV  Solu-Medrol   Influenza A -Continue oseltamavir   Diabetes mellitus type 2 with hyperglycemia -04/14/2022 hemoglobin A1c 7.2 -Holding oral hypoglycemics--Jardiance, Amaryl, metformin -NovoLog sliding scale -03/29/23 A1c--7.7   Essential hypertension -Continue atenolol   Coronary artery disease -Continue Imdur -Continue statin   Esophageal squamous cell carcinoma -03/12/2023 EGD--esophageal mucosa.,  1 to 2 cm hiatus hernia, normal stomach and duodenum--pathology negative for metaplasia -Continue pantoprazole   Hyperlipidemia -Continue statin   Lung nodules -Recently had a PET scan with low-grade activity -Patient has follow-up with pulmonary                 Family Communication:  no Family at bedside   Consultants:  none   Code Status:  FULL   DVT Prophylaxis:  [place] Lovenox     Procedures: As Listed in Progress Note Above   Antibiotics: Azithro 3/22>>          Subjective: Patient states that breathing is little better today.  She remains short of breath with mild exertion.  She denies any hemoptysis but she states her cough is little better.  Denies any headache, chest pain, nausea, vomiting or direct abdominal pain.  Objective: Vitals:   03/29/23 0830 03/29/23 0839 03/29/23 1300 03/29/23 1437  BP:  112/75 (!) 124/51   Pulse:  66 62   Resp:   18   Temp:   98.1 F (36.7 C)   TempSrc:   Oral   SpO2: 96%  98% 97%  Weight:      Height:        Intake/Output Summary (Last 24 hours) at 03/29/2023 1614 Last data filed at 03/28/2023  1855 Gross per 24 hour  Intake 1120 ml  Output --  Net 1120 ml   Weight change: -0.082 kg Exam:  General:  Pt is alert, follows commands appropriately, not in acute distress HEENT: No icterus, No thrush, No neck mass, /AT Cardiovascular: RRR, S1/S2, no rubs, no gallops Respiratory: Bibasilar rales.  Minimal bibasilar wheeze.  Good air movement. Abdomen: Soft/+BS, non tender, non distended, no guarding Extremities:  No edema, No lymphangitis, No petechiae, No rashes, no synovitis   Data Reviewed: I have personally reviewed following labs and imaging studies Basic Metabolic Panel: Recent Labs  Lab 03/24/23 1218 03/25/23 0411 03/27/23 1909 03/28/23 0541 03/29/23 0444  NA 132* 137 136 137 135  K 3.9 4.0 5.1 5.0 4.2  CL 95* 101 96* 99 102  CO2 26 26 29 28 27   GLUCOSE 200* 160* 205* 201* 134*  BUN 19 24* 23 25* 26*  CREATININE 0.70 0.62 0.89 0.72 0.64  CALCIUM 9.1 8.9 9.4 9.6 8.9  MG 2.2  --   --  2.3 2.1  PHOS  --   --   --  5.0*  --    Liver Function Tests: Recent Labs  Lab 03/24/23 1218 03/27/23 1909 03/28/23 0541  AST 15 19 13*  ALT 14 13 13   ALKPHOS 76 65 57  BILITOT 0.5 0.5 0.6  PROT 7.5 7.5 6.7  ALBUMIN 3.6 3.6 3.3*   No results for input(s): "LIPASE", "AMYLASE" in the last 168 hours. No results for input(s): "AMMONIA" in the last 168 hours. Coagulation Profile: Recent Labs  Lab 03/24/23 1218  INR 1.0   CBC: Recent Labs  Lab 03/24/23 1218 03/25/23 0411 03/27/23 1909 03/28/23 0541  WBC 4.6 3.9* 4.5 3.1*  NEUTROABS 3.9  --  3.7  --   HGB 10.9* 9.7* 10.9* 10.0*  HCT 35.0* 32.2* 34.4* 32.7*  MCV 85.4 87.0 85.4 86.7  PLT 110* 108* 157 136*   Cardiac Enzymes: No results for input(s): "CKTOTAL", "CKMB", "CKMBINDEX", "TROPONINI" in the last 168 hours. BNP: Invalid input(s): "POCBNP" CBG: Recent Labs  Lab 03/28/23 1246 03/28/23 1641 03/28/23 2032 03/29/23 0731 03/29/23 1115  GLUCAP 178* 156* 244* 178* 171*   HbA1C: Recent Labs    03/28/23 0541  HGBA1C 7.7*   Urine analysis:    Component Value Date/Time   COLORURINE YELLOW 11/21/2016 1012   APPEARANCEUR Clear 03/28/2022 1413   LABSPEC 1.015 08/31/2017 1124   PHURINE 7.0 08/31/2017 1124   GLUCOSEU 3+ (A) 03/28/2022 1413   HGBUR NEGATIVE 08/31/2017 1124   BILIRUBINUR Negative 03/28/2022 1413   KETONESUR NEGATIVE 08/31/2017 1124   PROTEINUR Negative 03/28/2022 1413   PROTEINUR NEGATIVE 08/31/2017  1124   UROBILINOGEN 1.0 08/31/2017 1124   NITRITE Negative 03/28/2022 1413   NITRITE NEGATIVE 08/31/2017 1124   LEUKOCYTESUR Trace (A) 03/28/2022 1413   Sepsis Labs: @LABRCNTIP (procalcitonin:4,lacticidven:4) ) Recent Results (from the past 240 hours)  Resp panel by RT-PCR (RSV, Flu A&B, Covid) Anterior Nasal Swab     Status: Abnormal   Collection Time: 03/24/23 12:18 PM   Specimen: Anterior Nasal Swab  Result Value Ref Range Status   SARS Coronavirus 2 by RT PCR NEGATIVE NEGATIVE Final    Comment: (NOTE) SARS-CoV-2 target nucleic acids are NOT DETECTED.  The SARS-CoV-2 RNA is generally detectable in upper respiratory specimens during the acute phase of infection. The lowest concentration of SARS-CoV-2 viral copies this assay can detect is 138 copies/mL. A negative result does not preclude SARS-Cov-2 infection and should not be used as  the sole basis for treatment or other patient management decisions. A negative result may occur with  improper specimen collection/handling, submission of specimen other than nasopharyngeal swab, presence of viral mutation(s) within the areas targeted by this assay, and inadequate number of viral copies(<138 copies/mL). A negative result must be combined with clinical observations, patient history, and epidemiological information. The expected result is Negative.  Fact Sheet for Patients:  BloggerCourse.com  Fact Sheet for Healthcare Providers:  SeriousBroker.it  This test is no t yet approved or cleared by the [place] FDA and  has been authorized for detection and/or diagnosis of SARS-CoV-2 by FDA under an Emergency Use Authorization (EUA). This EUA will remain  in effect (meaning this test can be used) for the duration of the COVID-19 declaration under Section 564(b)(1) of the Act, 21 U.S.C.section 360bbb-3(b)(1), unless the authorization is terminated  or revoked sooner.        Influenza A by PCR POSITIVE (A) NEGATIVE Final   Influenza B by PCR NEGATIVE NEGATIVE Final    Comment: (NOTE) The Xpert Xpress SARS-CoV-2/FLU/RSV plus assay is intended as an aid in the diagnosis of influenza from Nasopharyngeal swab specimens and should not be used as a sole basis for treatment. Nasal washings and aspirates are unacceptable for Xpert Xpress SARS-CoV-2/FLU/RSV testing.  Fact Sheet for Patients: BloggerCourse.com  Fact Sheet for Healthcare Providers: SeriousBroker.it  This test is not yet approved or cleared by the [place] FDA and has been authorized for detection and/or diagnosis of SARS-CoV-2 by FDA under an Emergency Use Authorization (EUA). This EUA will remain in effect (meaning this test can be used) for the duration of the COVID-19 declaration under Section 564(b)(1) of the Act, 21 U.S.C. section 360bbb-3(b)(1), unless the authorization is terminated or revoked.     Resp Syncytial Virus by PCR NEGATIVE NEGATIVE Final    Comment: (NOTE) Fact Sheet for Patients: BloggerCourse.com  Fact Sheet for Healthcare Providers: SeriousBroker.it  This test is not yet approved or cleared by the [place] FDA and has been authorized for detection and/or diagnosis of SARS-CoV-2 by FDA under an Emergency Use Authorization (EUA). This EUA will remain in effect (meaning this test can be used) for the duration of the COVID-19 declaration under Section 564(b)(1) of the Act, 21 U.S.C. section 360bbb-3(b)(1), unless the authorization is terminated or revoked.  Performed at [facility], [address] [place], [place] [address]   Blood Culture (routine x 2)     Status: None   Collection Time: 03/24/23 12:18 PM   Specimen: BLOOD  Result Value Ref Range Status   Specimen Description BLOOD BLOOD LEFT ARM  Final   Special Requests   Final    Blood Culture results may  not be optimal due to an inadequate volume of blood received in culture bottles BOTTLES DRAWN AEROBIC AND ANAEROBIC   Culture   Final    NO GROWTH 5 DAYS Performed at [facility], [address], [place], [place] [address]    Report Status 03/29/2023 FINAL  Final  Blood Culture (routine x 2)     Status: None   Collection Time: 03/24/23 12:18 PM   Specimen: BLOOD  Result Value Ref Range Status   Specimen Description BLOOD rfoa  Final   Special Requests   Final    BOTTLES DRAWN AEROBIC AND ANAEROBIC Blood Culture results may not be optimal due to an inadequate volume of blood received in culture bottles   Culture   Final    NO GROWTH  5 DAYS Performed at [facility], [address] [place], [place] [address]    Report Status 03/29/2023 FINAL  Final     Scheduled Meds:  albuterol  2.5 mg Nebulization Q6H   aspirin EC  81 mg Oral Daily   atenolol  12.5 mg Oral BID   azithromycin  250 mg Oral Daily   budesonide (PULMICORT) nebulizer solution  0.5 mg Nebulization BID   dextromethorphan-guaiFENesin  1 tablet Oral BID   enoxaparin (LOVENOX) injection  40 mg Subcutaneous Q24H   insulin aspart  0-5 Units Subcutaneous QHS   insulin aspart  0-9 Units Subcutaneous TID WC   methylPREDNISolone (SOLU-MEDROL) injection  40 mg Intravenous Q12H   oseltamivir  30 mg Oral BID   pantoprazole  40 mg Oral Daily   revefenacin  175 mcg Nebulization Daily   rosuvastatin  10 mg Oral Daily   Continuous Infusions:  Procedures/Studies: ECHOCARDIOGRAM COMPLETE Result Date: 03/28/2023    ECHOCARDIOGRAM REPORT   Patient Name:   IOLA TURRI [facility] Date of Exam: 03/28/2023 Medical Rec #:  [id number]                Height:       60.0 in Accession #:    [id number]               Weight:       134.0 lb Date of Birth:  30-Jun-1950                BSA:          1.574 m Patient Age:    72 years                 BP:           142/65 mmHg Patient Gender: F                        HR:           91 bpm. [facility] Procedure: 2D Echo, Cardiac Doppler and Color Doppler (Both Spectral and Color            Flow Doppler were utilized during procedure). Indications:    CHF-Acute Diastolic I50.31  History:        Patient has no prior history of Echocardiogram examinations.                 COPD; Risk Factors:Hypertension, Diabetes and Dyslipidemia.                 History of esophageal cancer.  Sonographer:    Celesta Gentile RCS Referring Phys: [id number] OLADAPO ADEFESO IMPRESSIONS  1. Left ventricular ejection fraction, by estimation, is 60 to 65%. The left ventricle has normal function. The left ventricle has no regional wall motion abnormalities. Left ventricular diastolic parameters are indeterminate.  2. Right ventricular systolic function is normal. The right ventricular size is normal. Tricuspid regurgitation signal is inadequate for assessing PA pressure.  3. Right atrial size was mildly dilated.  4. The mitral valve is degenerative. No evidence of mitral valve regurgitation. No evidence of mitral stenosis.  5. The aortic valve is tricuspid. Aortic valve regurgitation is not visualized. No aortic stenosis is present.  6. The inferior vena cava is normal in size with greater than 50% respiratory variability, suggesting right atrial pressure of 3 mmHg. Comparison(s): No prior Echocardiogram. FINDINGS  Left Ventricle: Left ventricular ejection fraction, by estimation, is 60 to 65%.  The left ventricle has normal function. The left ventricle has no regional wall motion abnormalities. The left ventricular internal cavity size was normal in size. There is  no left ventricular hypertrophy. Left ventricular diastolic parameters are indeterminate. Right Ventricle: The right ventricular size is normal. No increase in right ventricular wall thickness. Right ventricular systolic function is normal. Tricuspid regurgitation signal is inadequate for assessing PA pressure. Left Atrium: Left atrial size was normal in size. Right  Atrium: Right atrial size was mildly dilated. Pericardium: There is no evidence of pericardial effusion. Mitral Valve: The mitral valve is degenerative in appearance. Mild mitral annular calcification. No evidence of mitral valve regurgitation. No evidence of mitral valve stenosis. Tricuspid Valve: The tricuspid valve is normal in structure. Tricuspid valve regurgitation is not demonstrated. No evidence of tricuspid stenosis. Aortic Valve: The aortic valve is tricuspid. Aortic valve regurgitation is not visualized. No aortic stenosis is present. Pulmonic Valve: The pulmonic valve was not well visualized. Pulmonic valve regurgitation is not visualized. No evidence of pulmonic stenosis. Aorta: The aortic root is normal in size and structure. Venous: The inferior vena cava is normal in size with greater than 50% respiratory variability, suggesting right atrial pressure of 3 mmHg. IAS/Shunts: There is redundancy of the interatrial septum. The interatrial septum was not well visualized.  LEFT VENTRICLE PLAX 2D LVIDd:         4.30 cm   Diastology LVIDs:         2.20 cm   LV e' medial:    6.64 cm/s LV PW:         0.90 cm   LV E/e' medial:  15.4 LV IVS:        1.00 cm   LV e' lateral:   7.83 cm/s LVOT diam:     1.90 cm   LV E/e' lateral: 13.0 LV SV:         68 LV SV Index:   43 LVOT Area:     2.84 cm  RIGHT VENTRICLE RV S prime:     21.55 cm/s TAPSE (M-mode): 2.4 cm LEFT ATRIUM             Index        RIGHT ATRIUM           Index LA diam:        3.30 cm 2.10 cm/m   RA Area:     21.10 cm LA Vol (A2C):   27.2 ml 17.28 ml/m  RA Volume:   65.80 ml  41.79 ml/m LA Vol (A4C):   46.7 ml 29.66 ml/m LA Biplane Vol: 36.7 ml 23.31 ml/m  AORTIC VALVE LVOT Vmax:   130.00 cm/s LVOT Vmean:  82.400 cm/s LVOT VTI:    0.239 m  AORTA Ao Root diam: 3.00 cm MITRAL VALVE MV Area (PHT): 2.42 cm     SHUNTS MV Decel Time: 313 msec     Systemic VTI:  0.24 m MV E velocity: 102.00 cm/s  Systemic Diam: 1.90 cm MV A velocity: 163.00 cm/s MV E/A  ratio:  0.63 Sunit Tolia Electronically signed by Tessa Lerner Signature Date/Time: 03/28/2023/4:12:26 PM    Final    DG Chest 2 View Result Date: 03/27/2023 CLINICAL DATA:  Dyspnea EXAM: CHEST - 2 VIEW COMPARISON:  03/24/2023 FINDINGS: Cardiac shadows within normal limits. Calcified granuloma is again seen in the right mid lung. Nodular density in the right upper lobe is noted similar to that seen on prior PET-CT. The known left lower lobe  nodule is not as well appreciated on this exam. Bony structures are within normal limits. IMPRESSION: No acute abnormality noted. Electronically Signed   By: Alcide Clever M.D.   On: 03/27/2023 20:05   DG Chest Port 1 View Result Date: 03/24/2023 CLINICAL DATA:  Sepsis. History of squamous cell carcinoma of the esophagus. EXAM: PORTABLE CHEST 1 VIEW COMPARISON:  X-ray 11/19/2020 and older.  PET-CT 01/16/2023. FINDINGS: No consolidation, pneumothorax or effusion. Normal cardiopericardial silhouette without edema. Slight curvature of the spine. Dense right midlung nodule identified consistent with likely calcified and old granulomatous disease. Please correlate with recent cross-sectional imaging study. IMPRESSION: No acute cardiopulmonary disease. Electronically Signed   By: Karen Kays M.D.   On: 03/24/2023 13:58    Catarina Hartshorn, DO  [facility]  If 7PM-7AM, please contact night-coverage [email or web address] Password [email or web address] 03/29/2023, 4:14 PM   LOS: 1 day

## 2023-03-30 ENCOUNTER — Ambulatory Visit: Payer: PPO | Admitting: Family

## 2023-03-30 DIAGNOSIS — J441 Chronic obstructive pulmonary disease with (acute) exacerbation: Secondary | ICD-10-CM | POA: Diagnosis not present

## 2023-03-30 DIAGNOSIS — J9601 Acute respiratory failure with hypoxia: Secondary | ICD-10-CM | POA: Diagnosis not present

## 2023-03-30 DIAGNOSIS — J101 Influenza due to other identified influenza virus with other respiratory manifestations: Secondary | ICD-10-CM | POA: Diagnosis not present

## 2023-03-30 LAB — GLUCOSE, CAPILLARY: Glucose-Capillary: 164 mg/dL — ABNORMAL HIGH (ref 70–99)

## 2023-03-30 MED ORDER — IPRATROPIUM-ALBUTEROL 0.5-2.5 (3) MG/3ML IN SOLN: 3.0000 mL | Freq: Three times a day (TID) | RESPIRATORY_TRACT | Status: DC

## 2023-03-30 MED ORDER — ATENOLOL 25 MG PO TABS: 12.5000 mg | ORAL_TABLET | Freq: Two times a day (BID) | ORAL | Status: AC

## 2023-03-30 MED ORDER — ALBUTEROL SULFATE (2.5 MG/3ML) 0.083% IN NEBU
2.5000 mg | INHALATION_SOLUTION | Freq: Three times a day (TID) | RESPIRATORY_TRACT | Status: DC
  Administered 2023-03-30: 2.5 mg via RESPIRATORY_TRACT
  Filled 2023-03-30: qty 3

## 2023-03-30 MED ORDER — AZITHROMYCIN 250 MG PO TABS: 250.0000 mg | ORAL_TABLET | Freq: Every day | ORAL | 0 refills | Status: DC

## 2023-03-30 MED ORDER — IPRATROPIUM-ALBUTEROL 0.5-2.5 (3) MG/3ML IN SOLN: 3.0000 mL | Freq: Three times a day (TID) | RESPIRATORY_TRACT | 1 refills | Status: AC

## 2023-03-30 NOTE — Telephone Encounter (Signed)
Pt needs hospital follow up visit

## 2023-03-30 NOTE — Care Management Important Message (Signed)
 Important Message  Patient Details  Name: Erica Braun MRN: [id number] Date of Birth: 1950/09/30   Important Message Given:  N/A - LOS <3 / Initial given by admissions     Corey Harold 03/30/2023, 11:38 AM

## 2023-03-30 NOTE — Plan of Care (Signed)

## 2023-03-30 NOTE — Progress Notes (Signed)
 SATURATION QUALIFICATIONS: (This note is used to comply with regulatory documentation for home oxygen)   Patient Saturations on Room Air at Rest = 89   Patient Saturations on Room Air while Ambulating = 87   Patient Saturations on 2 Liters of oxygen while Ambulating = 96   Please briefly explain why patient needs home oxygen: To maintain 02 sat at 90% or above during ambulation.   Erica Hartshorn, DO

## 2023-03-30 NOTE — Discharge Summary (Signed)
 Physician Discharge Summary   Patient: Erica Braun MRN: [id number] DOB: [birth year]  Admit date:     03/27/2023  Discharge date: 03/30/23  Discharge Physician: Onalee Hua Mckenzey Parcell   PCP: Junie Spencer, [name]   Recommendations at discharge:   Please follow up with primary care provider within 1-2 weeks  Please repeat BMP and CBC in one week    Hospital Course: 73 year old female with a history of coronary disease, COPD, hypertension, hyperlipidemia, diabetes mellitus type 2, GERD, esophageal squamous cell carcinoma (had surgery and tx at [facility]) presenting with shortness of breath.  The patient was recently hospitalized from 03/24/23 to 03/26/23 for acute respiratory failure and COPD exacerbation secondary to influenza.  The patient was discharged home on room air with 3 more days of oseltamavir.  She was sent home with a prednisone taper.  The patient states that after she took a shower on the evening that she returned home she noticed her oxygen saturation was in the 80s.  She also noted that she was tachycardic into the 110s.  The next morning, on 03/27/2023, the patient once again had some tachycardia with exertion and shortness of breath.  Her oxygen saturation remained in the 80s at home.  She was instructed to go to emergency department by her PCP.  Patient denies fevers, chills, headache, neck pain, chest pain, abdominal pain.  She has nonproductive cough.  No hemoptysis.  She denies any diarrhea, hematochezia or melena. She quit smoking 15 years ago after about 70-pack-year history. In the ED, the patient was afebrile and hemodynamically stable.  She was initially tachycardic into the 120s.  Oxygen saturation was 97% 2 L.  Previously 3.1, hemoglobin 10.0, platelets 136.  Sodium 137, potassium 5.0, bicarbonate 20, serum creatinine 0.72.  LFTs unremarkable.  Chest x-ray showed hyperinflation without any consolidations.  The patient was started bronchodilators and Solu-Medrol.  Assessment and  Plan: Acute respiratory failure with hypoxia -Secondary to COPD exacerbation in the setting of influenza -Stable on 2 L -Wean oxygen as tolerated -desaturated on RA with ambulation on day of dc -discharge home with 2L oxygen   COPD exacerbation -Started Pulmicort -Started Brovana -started Yupelri -Continue IV Solu-Medrol -d/c home with prednisone taper D/c home with duonebs q 8 hours   Influenza A -Continue oseltamavir--has finished 5 days   Diabetes mellitus type 2 with hyperglycemia -04/14/2022 hemoglobin A1c 7.2 -Holding oral hypoglycemics--Jardiance, Amaryl, metformin>>restart after d/c -NovoLog sliding scale -03/29/23 A1c--7.7   Essential hypertension -Continue atenolol>>decrease dose to 12.5 mg bid due to low HR in low 60s   Coronary artery disease -Continue Imdur -Continue statin -no chest pain   Esophageal squamous cell carcinoma -03/12/2023 EGD--esophageal mucosa.,  1 to 2 cm hiatus hernia, normal stomach and duodenum--pathology negative for metaplasia -Continue pantoprazole   Hyperlipidemia -Continue statin   Lung nodules -Recently had a PET scan with low-grade activity -Patient has follow-up with pulmonary         Consultants: none Procedures performed: none  Disposition: Home Diet recommendation:  Carb modified diet DISCHARGE MEDICATION: Allergies as of 03/30/2023   No Known Allergies      Medication List     STOP taking these medications    albuterol (2.5 MG/3ML) 0.083% nebulizer solution Commonly known as: PROVENTIL   oseltamivir 30 MG capsule Commonly known as: TAMIFLU       TAKE these medications    acetaminophen 500 MG tablet Commonly known as: TYLENOL Take 500 mg by mouth every 6 (six) hours as needed for mild  pain (pain score 1-3).   ADULT ONE DAILY GUMMIES PO Take 2 tablets by mouth in the morning. Women's Multi by [employer]   aspirin EC 81 MG tablet Take 81 mg by mouth in the morning.   atenolol 25 MG  tablet Commonly known as: TENORMIN Take 0.5 tablets (12.5 mg total) by mouth 2 (two) times daily. What changed: how much to take   azithromycin 250 MG tablet Commonly known as: ZITHROMAX Take 1 tablet (250 mg total) by mouth daily. Start taking on: March 31, 2023   calcium carbonate 1500 (600 Ca) MG Tabs tablet Commonly known as: OSCAL Take 600 mg by mouth in the morning and at bedtime.   empagliflozin 10 MG Tabs tablet Commonly known as: Jardiance Take 1 tablet (10 mg total) by mouth daily before breakfast.   famotidine 40 MG tablet Commonly known as: PEPCID Take 1 tablet (40 mg total) by mouth at bedtime.   fluticasone-salmeterol 250-50 MCG/ACT Aepb Commonly known as: ADVAIR Inhale 1 puff into the lungs every 12 (twelve) hours.   glimepiride 2 MG tablet Commonly known as: AMARYL Take 1 tablet (2 mg total) by mouth daily as needed (blood sugar more than 200.).   ipratropium-albuterol 0.5-2.5 (3) MG/3ML Soln Commonly known as: DUONEB Take 3 mLs by nebulization every 8 (eight) hours. Start taking on: March 31, 2023   isosorbide mononitrate 30 MG 24 hr tablet Commonly known as: IMDUR Take 0.5 tablets (15 mg total) by mouth daily.   metFORMIN 1000 MG tablet Commonly known as: GLUCOPHAGE Take 1 tablet (1,000 mg total) by mouth 2 (two) times daily with a meal.   nitroGLYCERIN 0.4 MG SL tablet Commonly known as: Nitrostat Place 1 tablet (0.4 mg total) under the tongue every 5 (five) minutes as needed for chest pain.   omeprazole 40 MG capsule Commonly known as: PRILOSEC Take 1 capsule (40 mg total) by mouth daily.   predniSONE 10 MG tablet Commonly known as: DELTASONE Take 4 tablets (40 mg total) by mouth daily for 3 days, THEN 3 tablets (30 mg total) daily for 3 days, THEN 2 tablets (20 mg total) daily for 3 days, THEN 1 tablet (10 mg total) daily for 3 days. Start taking on: March 26, 2023   rosuvastatin 10 MG tablet Commonly known as: CRESTOR Take 1 tablet by  mouth once daily   VITAMIN D-3 PO Take 5,000 Units by mouth in the morning.               Durable Medical Equipment  (From admission, onward)           Start     Ordered   03/30/23 0941  For home use only DME oxygen  Once       Comments: POC for COPD  Question Answer Comment  Length of Need 6 Months   Mode or (Route) Nasal cannula   Liters per Minute 2   Frequency Continuous (stationary and portable oxygen unit needed)   Oxygen conserving device Yes   Oxygen delivery system Gas      03/30/23 0940            Discharge Exam: Filed Weights   03/28/23 1324 03/29/23 0500 03/30/23 0612  Weight: 60.7 kg 60.5 kg 60.2 kg   HEENT:  [employer]/AT, No thrush, no icterus CV:  RRR, no rub, no S3, no S4 Lung:  diminished BS.  No wheeze Abd:  soft/+BS, NT Ext:  No edema, no lymphangitis, no synovitis, no rash   Condition  at discharge: stable  The results of significant diagnostics from this hospitalization (including imaging, microbiology, ancillary and laboratory) are listed below for reference.   Imaging Studies: ECHOCARDIOGRAM COMPLETE Result Date: 03/28/2023    ECHOCARDIOGRAM REPORT   Patient Name:   ANSELMA HERBEL [facility] Date of Exam: 03/28/2023 Medical Rec #:  [id number]                Height:       60.0 in Accession #:    [id number]               Weight:       134.0 lb Date of Birth:  Oct 07, 1950                BSA:          1.574 m Patient Age:    72 years                 BP:           142/65 mmHg Patient Gender: F                        HR:           91 bpm. [facility] Procedure: 2D Echo, Cardiac Doppler and Color Doppler (Both Spectral and Color            Flow Doppler were utilized during procedure). Indications:    CHF-Acute Diastolic I50.31  History:        Patient has no prior history of Echocardiogram examinations.                 COPD; Risk Factors:Hypertension, Diabetes and Dyslipidemia.                 History of esophageal cancer.  Sonographer:     Celesta Gentile RCS Referring Phys: [id number] OLADAPO ADEFESO IMPRESSIONS  1. Left ventricular ejection fraction, by estimation, is 60 to 65%. The left ventricle has normal function. The left ventricle has no regional wall motion abnormalities. Left ventricular diastolic parameters are indeterminate.  2. Right ventricular systolic function is normal. The right ventricular size is normal. Tricuspid regurgitation signal is inadequate for assessing PA pressure.  3. Right atrial size was mildly dilated.  4. The mitral valve is degenerative. No evidence of mitral valve regurgitation. No evidence of mitral stenosis.  5. The aortic valve is tricuspid. Aortic valve regurgitation is not visualized. No aortic stenosis is present.  6. The inferior vena cava is normal in size with greater than 50% respiratory variability, suggesting right atrial pressure of 3 mmHg. Comparison(s): No prior Echocardiogram. FINDINGS  Left Ventricle: Left ventricular ejection fraction, by estimation, is 60 to 65%. The left ventricle has normal function. The left ventricle has no regional wall motion abnormalities. The left ventricular internal cavity size was normal in size. There is  no left ventricular hypertrophy. Left ventricular diastolic parameters are indeterminate. Right Ventricle: The right ventricular size is normal. No increase in right ventricular wall thickness. Right ventricular systolic function is normal. Tricuspid regurgitation signal is inadequate for assessing PA pressure. Left Atrium: Left atrial size was normal in size. Right Atrium: Right atrial size was mildly dilated. Pericardium: There is no evidence of pericardial effusion. Mitral Valve: The mitral valve is degenerative in appearance. Mild mitral annular calcification. No evidence of mitral valve regurgitation. No evidence of mitral valve stenosis. Tricuspid Valve: The tricuspid valve is normal in structure. Tricuspid valve  regurgitation is not demonstrated. No evidence of  tricuspid stenosis. Aortic Valve: The aortic valve is tricuspid. Aortic valve regurgitation is not visualized. No aortic stenosis is present. Pulmonic Valve: The pulmonic valve was not well visualized. Pulmonic valve regurgitation is not visualized. No evidence of pulmonic stenosis. Aorta: The aortic root is normal in size and structure. Venous: The inferior vena cava is normal in size with greater than 50% respiratory variability, suggesting right atrial pressure of 3 mmHg. IAS/Shunts: There is redundancy of the interatrial septum. The interatrial septum was not well visualized.  LEFT VENTRICLE PLAX 2D LVIDd:         4.30 cm   Diastology LVIDs:         2.20 cm   LV e' medial:    6.64 cm/s LV PW:         0.90 cm   LV E/e' medial:  15.4 LV IVS:        1.00 cm   LV e' lateral:   7.83 cm/s LVOT diam:     1.90 cm   LV E/e' lateral: 13.0 LV SV:         68 LV SV Index:   43 LVOT Area:     2.84 cm  RIGHT VENTRICLE RV S prime:     21.55 cm/s TAPSE (M-mode): 2.4 cm LEFT ATRIUM             Index        RIGHT ATRIUM           Index LA diam:        3.30 cm 2.10 cm/m   RA Area:     21.10 cm LA Vol (A2C):   27.2 ml 17.28 ml/m  RA Volume:   65.80 ml  41.79 ml/m LA Vol (A4C):   46.7 ml 29.66 ml/m LA Biplane Vol: 36.7 ml 23.31 ml/m  AORTIC VALVE LVOT Vmax:   130.00 cm/s LVOT Vmean:  82.400 cm/s LVOT VTI:    0.239 m  AORTA Ao Root diam: 3.00 cm MITRAL VALVE MV Area (PHT): 2.42 cm     SHUNTS MV Decel Time: 313 msec     Systemic VTI:  0.24 m MV E velocity: 102.00 cm/s  Systemic Diam: 1.90 cm MV A velocity: 163.00 cm/s MV E/A ratio:  0.63 Sunit Tolia Electronically signed by Tessa Lerner Signature Date/Time: 03/28/2023/4:12:26 PM    Final    DG Chest 2 View Result Date: 03/27/2023 CLINICAL DATA:  Dyspnea EXAM: CHEST - 2 VIEW COMPARISON:  03/24/2023 FINDINGS: Cardiac shadows within normal limits. Calcified granuloma is again seen in the right mid lung. Nodular density in the right upper lobe is noted similar to that seen on  prior PET-CT. The known left lower lobe nodule is not as well appreciated on this exam. Bony structures are within normal limits. IMPRESSION: No acute abnormality noted. Electronically Signed   By: Alcide Clever M.D.   On: 03/27/2023 20:05   DG Chest Port 1 View Result Date: 03/24/2023 CLINICAL DATA:  Sepsis. History of squamous cell carcinoma of the esophagus. EXAM: PORTABLE CHEST 1 VIEW COMPARISON:  X-ray 11/19/2020 and older.  PET-CT 01/16/2023. FINDINGS: No consolidation, pneumothorax or effusion. Normal cardiopericardial silhouette without edema. Slight curvature of the spine. Dense right midlung nodule identified consistent with likely calcified and old granulomatous disease. Please correlate with recent cross-sectional imaging study. IMPRESSION: No acute cardiopulmonary disease. Electronically Signed   By: Karen Kays M.D.   On: 03/24/2023 13:58    Microbiology:  Results for orders placed or performed during the hospital encounter of 03/24/23  Resp panel by RT-PCR (RSV, Flu A&B, Covid) Anterior Nasal Swab     Status: Abnormal   Collection Time: 03/24/23 12:18 PM   Specimen: Anterior Nasal Swab  Result Value Ref Range Status   SARS Coronavirus 2 by RT PCR NEGATIVE NEGATIVE Final    Comment: (NOTE) SARS-CoV-2 target nucleic acids are NOT DETECTED.  The SARS-CoV-2 RNA is generally detectable in upper respiratory specimens during the acute phase of infection. The lowest concentration of SARS-CoV-2 viral copies this assay can detect is 138 copies/mL. A negative result does not preclude SARS-Cov-2 infection and should not be used as the sole basis for treatment or other patient management decisions. A negative result may occur with  improper specimen collection/handling, submission of specimen other than nasopharyngeal swab, presence of viral mutation(s) within the areas targeted by this assay, and inadequate number of viral copies(<138 copies/mL). A negative result must be combined  with clinical observations, patient history, and epidemiological information. The expected result is Negative.  Fact Sheet for Patients:  BloggerCourse.com  Fact Sheet for Healthcare Providers:  SeriousBroker.it  This test is no t yet approved or cleared by the [place] FDA and  has been authorized for detection and/or diagnosis of SARS-CoV-2 by FDA under an Emergency Use Authorization (EUA). This EUA will remain  in effect (meaning this test can be used) for the duration of the COVID-19 declaration under Section 564(b)(1) of the Act, 21 U.S.C.section 360bbb-3(b)(1), unless the authorization is terminated  or revoked sooner.       Influenza A by PCR POSITIVE (A) NEGATIVE Final   Influenza B by PCR NEGATIVE NEGATIVE Final    Comment: (NOTE) The Xpert Xpress SARS-CoV-2/FLU/RSV plus assay is intended as an aid in the diagnosis of influenza from Nasopharyngeal swab specimens and should not be used as a sole basis for treatment. Nasal washings and aspirates are unacceptable for Xpert Xpress SARS-CoV-2/FLU/RSV testing.  Fact Sheet for Patients: BloggerCourse.com  Fact Sheet for Healthcare Providers: SeriousBroker.it  This test is not yet approved or cleared by the [place] FDA and has been authorized for detection and/or diagnosis of SARS-CoV-2 by FDA under an Emergency Use Authorization (EUA). This EUA will remain in effect (meaning this test can be used) for the duration of the COVID-19 declaration under Section 564(b)(1) of the Act, 21 U.S.C. section 360bbb-3(b)(1), unless the authorization is terminated or revoked.     Resp Syncytial Virus by PCR NEGATIVE NEGATIVE Final    Comment: (NOTE) Fact Sheet for Patients: BloggerCourse.com  Fact Sheet for Healthcare Providers: SeriousBroker.it  This test is not yet  approved or cleared by the [place] FDA and has been authorized for detection and/or diagnosis of SARS-CoV-2 by FDA under an Emergency Use Authorization (EUA). This EUA will remain in effect (meaning this test can be used) for the duration of the COVID-19 declaration under Section 564(b)(1) of the Act, 21 U.S.C. section 360bbb-3(b)(1), unless the authorization is terminated or revoked.  Performed at [facility], [address], [place], [place] [address]   Blood Culture (routine x 2)     Status: None   Collection Time: 03/24/23 12:18 PM   Specimen: BLOOD  Result Value Ref Range Status   Specimen Description BLOOD BLOOD LEFT ARM  Final   Special Requests   Final    Blood Culture results may not be optimal due to an inadequate volume of blood received in culture bottles BOTTLES DRAWN  AEROBIC AND ANAEROBIC   Culture   Final    NO GROWTH 5 DAYS Performed at [facility], [address], [place], [place] [address]    Report Status 03/29/2023 FINAL  Final  Blood Culture (routine x 2)     Status: None   Collection Time: 03/24/23 12:18 PM   Specimen: BLOOD  Result Value Ref Range Status   Specimen Description BLOOD rfoa  Final   Special Requests   Final    BOTTLES DRAWN AEROBIC AND ANAEROBIC Blood Culture results may not be optimal due to an inadequate volume of blood received in culture bottles   Culture   Final    NO GROWTH 5 DAYS Performed at [facility], [address] [place], [place] [address]    Report Status 03/29/2023 FINAL  Final    Labs: CBC: Recent Labs  Lab 03/24/23 1218 03/25/23 0411 03/27/23 1909 03/28/23 0541  WBC 4.6 3.9* 4.5 3.1*  NEUTROABS 3.9  --  3.7  --   HGB 10.9* 9.7* 10.9* 10.0*  HCT 35.0* 32.2* 34.4* 32.7*  MCV 85.4 87.0 85.4 86.7  PLT 110* 108* 157 136*   Basic Metabolic Panel: Recent Labs  Lab 03/24/23 1218 03/25/23 0411 03/27/23 1909 03/28/23 0541 03/29/23 0444  NA 132* 137 136 137 135  K 3.9 4.0 5.1 5.0 4.2  CL 95* 101 96* 99 102   CO2 26 26 29 28 27   GLUCOSE 200* 160* 205* 201* 134*  BUN 19 24* 23 25* 26*  CREATININE 0.70 0.62 0.89 0.72 0.64  CALCIUM 9.1 8.9 9.4 9.6 8.9  MG 2.2  --   --  2.3 2.1  PHOS  --   --   --  5.0*  --    Liver Function Tests: Recent Labs  Lab 03/24/23 1218 03/27/23 1909 03/28/23 0541  AST 15 19 13*  ALT 14 13 13   ALKPHOS 76 65 57  BILITOT 0.5 0.5 0.6  PROT 7.5 7.5 6.7  ALBUMIN 3.6 3.6 3.3*   CBG: Recent Labs  Lab 03/29/23 0731 03/29/23 1115 03/29/23 1618 03/29/23 2145 03/30/23 0719  GLUCAP 178* 171* 159* 243* 164*    Discharge time spent: greater than 30 minutes.  Signed: Catarina Hartshorn, MD [facility] 03/30/2023

## 2023-03-30 NOTE — Telephone Encounter (Signed)
 Patient had appointment today no showed she needs an appt scheduled when she calls back left message.

## 2023-03-30 NOTE — TOC Transition Note (Signed)
 Transition of Care [facility]) - Discharge Note   Patient Details  Name: Erica Braun MRN: [id number] Date of Birth: [birth year]  [facility] CM/SW Contact:  Karn Cassis, LCSW Phone Number: 03/30/2023, 10:34 AM   Clinical Narrative: Pt d/c today. Will require home O2. LCSW discussed with pt and pt requests [employer]. Referred to [facility] and Zach with [employer]. Portable tank will be delivered to pt's room. RN updated.       Final next level of care: Home/Self Care Barriers to Discharge: Barriers Resolved   Patient Goals and CMS Choice Patient states their goals for this hospitalization and ongoing recovery are:: return home   Choice offered to / list presented to : Patient [place] ownership interest in [facility].provided to::  (n/a)    Discharge Placement                    Patient and family notified of of transfer: 03/30/23  Discharge Plan and Services Additional resources added to the After Visit Summary for                  DME Arranged: Oxygen DME Agency: [employer] Date DME Agency Contacted: 03/30/23 Time DME Agency Contacted: 1034 Representative spoke with at DME Agency: Mitch/Zach            Social Drivers of Health (SDOH) Interventions SDOH Screenings   Food Insecurity: No Food Insecurity (03/28/2023)  Housing: Low Risk  (03/28/2023)  Transportation Needs: No Transportation Needs (03/28/2023)  Utilities: Not At Risk (03/28/2023)  Alcohol Screen: Low Risk  (03/26/2023)  Depression (PHQ2-9): Low Risk  (04/14/2022)  Financial Resource Strain: Low Risk  (03/26/2023)  Physical Activity: Insufficiently Active (03/26/2023)  Social Connections: Socially Integrated (03/28/2023)  Stress: No Stress Concern Present (03/26/2023)  Tobacco Use: Medium Risk (03/27/2023)     Readmission Risk Interventions     No data to display

## 2023-03-31 ENCOUNTER — Telehealth: Payer: Self-pay | Admitting: *Deleted

## 2023-03-31 ENCOUNTER — Other Ambulatory Visit: Payer: Self-pay | Admitting: *Deleted

## 2023-03-31 ENCOUNTER — Encounter: Payer: Self-pay | Admitting: *Deleted

## 2023-03-31 ENCOUNTER — Encounter: Payer: Self-pay | Admitting: Family

## 2023-03-31 NOTE — Transitions of Care (Post Inpatient/ED Visit) (Signed)
 03/31/2023  Name: Erica Braun MRN: [id number] DOB: January 01, 1951  Today's TOC FU Call Status: Today's TOC FU Call Status:: Successful TOC FU Call Completed TOC FU Call Complete Date: 03/31/23 Patient's Name and Date of Birth confirmed.  Transition Care Management Follow-up Telephone Call Date of Discharge: 03/30/23 [facility] Type of Discharge: Inpatient Admission Primary Inpatient Discharge Diagnosis:: Acute exacerbation of chronic obstructive pulmonary disease (COPD) How have you been since you were released from the hospital?:  (ambulating without difficulty, using oxygen 2 L via [place] as prescribed, eating & drinking well,  CBG fasting today 153) Any questions or concerns?: No  Items Reviewed: Did you receive and understand the discharge instructions provided?: Yes Dietary orders reviewed?: Yes Type of Diet Ordered:: heart healthy, carbohydrate modified Do you have support at home?: Yes People in Home: spouse Name of Support/Comfort Primary Source: Marcine Matar Patient consented to enrollment in [facility] 30 day program   Goals Addressed             This Visit's Progress    HEMOGLOBIN A1C < 7.0       Current Barriers:  Knowledge Deficits related to plan of care for management of COPD   RNCM Clinical Goal(s):  Patient will verbalize understanding of plan for management of COPD as evidenced by pt report, review of EMR take all medications exactly as prescribed and will call provider for medication related questions as evidenced by pt report, review of EMR and  through collaboration with RN Care manager, provider, and care team.   Interventions: Evaluation of current treatment plan related to  self management and patient's adherence to plan as established by provider   COPD Interventions:  (Status:  New goal. and Goal on track:  Yes.) Short Term Goal Advised patient to track and manage COPD triggers Provided instruction about proper use of  medications used for management of COPD including inhalers Advised patient to self assesses COPD action plan zone and make appointment with provider if in the yellow zone for 48 hours without improvement Advised patient to engage in light exercise as tolerated 3-5 days a week to aid in the the management of COPD Discussed the importance of adequate rest and management of fatigue with COPD Assessed social determinant of health barriers Reviewed COPD action plan Verified patient does have oxygen and using as prescribed 2 liters via , 24 hours per day, pt is checking 02 saturation daily Reviewed upcoming scheduled appointments  Patient Goals/Self-Care Activities: Participate in Transition of Care Program/Attend [facility] scheduled calls Notify RN Care Manager of TOC call rescheduling needs Take all medications as prescribed Attend all scheduled provider appointments Call pharmacy for medication refills 3-7 days in advance of running out of medications Attend church or other social activities Perform all self care activities independently  Perform IADL's (shopping, preparing meals, housekeeping, managing finances) independently Call provider office for new concerns or questions  identify and remove indoor air pollutants listen for public air quality announcements every day do breathing exercises every day develop a rescue plan eat healthy/prescribed diet: heart healthy, carbohydrate modified get at least 7 to 8 hours of sleep at night Use oxygen as prescribed Check oxygen saturation daily Call today and scheduled post hospital follow up appointment  Follow Up Plan:  Telephone follow up appointment with care management team member scheduled for:  04/08/23 @ 115 pm       TOC care plan/ pt will have no readmission within 30 days  Medications Reviewed Today: Medications Reviewed Today     Reviewed by Audrie Gallus, RN (Registered Nurse) on 03/31/23 at 1230  Med List Status: <None>    Medication Order Taking? Sig Documenting Provider Last Dose Status Informant  acetaminophen (TYLENOL) 500 MG tablet [id number] Yes Take 500 mg by mouth every 6 (six) hours as needed for mild pain (pain score 1-3). [provider] Taking Active   aspirin EC 81 MG tablet [id number] Yes Take 81 mg by mouth in the morning. [provider] Taking Active Self  atenolol (TENORMIN) 25 MG tablet [id number] Yes Take 0.5 tablets (12.5 mg total) by mouth 2 (two) times daily. Catarina Hartshorn, MD Taking Active   azithromycin (ZITHROMAX) 250 MG tablet [id number] Yes Take 1 tablet (250 mg total) by mouth daily. Catarina Hartshorn, MD Taking Active   calcium carbonate (OSCAL) 1500 (600 Ca) MG TABS tablet [id number] No Take 600 mg by mouth in the morning and at bedtime.  Patient not taking: Reported on 03/31/2023   [provider] Not Taking Active Self  Cholecalciferol (VITAMIN D-3 PO) [id number] No Take 5,000 Units by mouth in the morning.  Patient not taking: Reported on 03/31/2023   [provider] Not Taking Active Self  empagliflozin (JARDIANCE) 10 MG TABS tablet [id number] Yes Take 1 tablet (10 mg total) by mouth daily before breakfast. [name] A, [name] Taking Active   famotidine (PEPCID) 40 MG tablet [id number] Yes Take 1 tablet (40 mg total) by mouth at bedtime. Junie Spencer, [name] Taking Active   fluticasone-salmeterol (ADVAIR) 250-50 MCG/ACT AEPB [id number] Yes Inhale 1 puff into the lungs every 12 (twelve) hours. Coralyn Helling, MD Taking Active   glimepiride (AMARYL) 2 MG tablet [id number] Yes Take 1 tablet (2 mg total) by mouth daily as needed (blood sugar more than 200.). [name] A, [name] Taking Active   ipratropium-albuterol (DUONEB) 0.5-2.5 (3) MG/3ML SOLN [id number] Yes Take 3 mLs by nebulization every 8 (eight) hours. Catarina Hartshorn, MD Taking Active   isosorbide mononitrate (IMDUR) 30 MG 24 hr tablet [id number]  Take 0.5 tablets (15 mg total) by mouth daily. Azucena Fallen,  MD  Active   metFORMIN (GLUCOPHAGE) 1000 MG tablet [id number] Yes Take 1 tablet (1,000 mg total) by mouth 2 (two) times daily with a meal. Hawks, Christy A, [name] Taking Active   Multiple Vitamins-Minerals (ADULT ONE DAILY GUMMIES PO) [id number] No Take 2 tablets by mouth in the morning. Women's Multi by [employer]  Patient not taking: Reported on 03/31/2023   [provider] Not Taking Active Self  nitroGLYCERIN (NITROSTAT) 0.4 MG SL tablet [id number] Yes Place 1 tablet (0.4 mg total) under the tongue every 5 (five) minutes as needed for chest pain. Wendall Stade, MD Taking Active            Med Note Elesa Massed, Illene Labrador   Sat Mar 28, 2023  5:55 PM) Pt has not had this within the last 30 days, does have at home in case of need  omeprazole (PRILOSEC) 40 MG capsule [id number] Yes Take 1 capsule (40 mg total) by mouth daily. Junie Spencer, [name] Taking Active   predniSONE (DELTASONE) 10 MG tablet [id number] Yes Take 4 tablets (40 mg total) by mouth daily for 3 days, THEN 3 tablets (30 mg total) daily for 3 days, THEN 2 tablets (20 mg total) daily for 3 days, THEN 1 tablet (10 mg total) daily for 3 days. Azucena Fallen, MD Taking Active  Med Note (WARD, ANGELICA G   Sat Mar 28, 2023  5:52 PM) Took 2 yesterday, on the 4 tablets daily  rosuvastatin (CRESTOR) 10 MG tablet [id number] Yes Take 1 tablet by mouth once daily Wendall Stade, MD Taking Active             Home Care and Equipment/Supplies: Were Home Health Services Ordered?: No Any new equipment or medical supplies ordered?: Yes Name of Medical supply agency?: pt has new home oxygen-- [employer] Were you able to get the equipment/medical supplies?: Yes Do you have any questions related to the use of the equipment/supplies?: No  Functional Questionnaire: Do you need assistance with bathing/showering or dressing?: No Do you need assistance with meal preparation?: No Do you need assistance with eating?: No Do you have  difficulty maintaining continence: No Do you need assistance with getting out of bed/getting out of a chair/moving?: No Do you have difficulty managing or taking your medications?: No  Follow up appointments reviewed: PCP Follow-up appointment confirmed?: No (pt states she will call and schedule follow  up appointment) MD Provider Line Number:[phone] Given:  (pt will make appointment to accomodate spouse as he will be the one taking her) Specialist Hospital Follow-up appointment confirmed?: Yes Date of Specialist follow-up appointment?: 04/27/23 Follow-Up Specialty Provider:: Cardiology 4/21 @ 115 pm    Pulmonary   Adewale Olaere  4/22  @ 130 pm Reason Specialist Follow-Up Not Confirmed: Patient has Specialist Provider Number and will Call for Appointment Do you need transportation to your follow-up appointment?: No Do you understand care options if your condition(s) worsen?: Yes-patient verbalized understanding  SDOH Interventions Today    Flowsheet Row Most Recent Value  SDOH Interventions   Food Insecurity Interventions Intervention Not Indicated  Housing Interventions Intervention Not Indicated  Transportation Interventions Intervention Not Indicated  Utilities Interventions Intervention Not Indicated       Irving Shows [facility], BSN RN Care Manager/ Transition of Care [place]/ [facility] Population Health [phone]

## 2023-04-08 ENCOUNTER — Encounter: Payer: Self-pay | Admitting: *Deleted

## 2023-04-08 ENCOUNTER — Other Ambulatory Visit: Payer: Self-pay | Admitting: *Deleted

## 2023-04-08 DIAGNOSIS — K2100 Gastro-esophageal reflux disease with esophagitis, without bleeding: Secondary | ICD-10-CM

## 2023-04-08 MED ORDER — OMEPRAZOLE 40 MG PO CPDR: 40.0000 mg | DELAYED_RELEASE_CAPSULE | Freq: Every day | ORAL | 0 refills | Status: DC

## 2023-04-08 NOTE — Patient Instructions (Signed)
 Visit Information  Thank you for taking time to visit with me today. Please don't hesitate to contact me if I can be of assistance to you before our next scheduled telephone appointment.  Following are the goals we discussed today:   Goals Addressed             This Visit's Progress    COMPLETED: TOC care plan/ pt will have no readmission within 30 days       Current Barriers:  Knowledge Deficits related to plan of care for management of COPD  Patient reports feeling better, continues using oxygen as prescribed, CBG today 187, pt states " this is slowly coming down since I've finished prednisone",  pt has finished antibiotic and prednisone, pt is using CPAP nightly with oxygen, breathing well, reports has all medications and taking as prescribed, no new concerns reported.  RNCM Clinical Goal(s):  Patient will verbalize understanding of plan for management of COPD as evidenced by pt report, review of EMR take all medications exactly as prescribed and will call provider for medication related questions as evidenced by pt report, review of EMR and through collaboration with RN Care manager, provider, and care team.   Interventions: Evaluation of current treatment plan related to  self management and patient's adherence to plan as established by provider   COPD Interventions:  (Status:  New goal. and Goal on track:  Yes.) Short Term Goal Advised patient to track and manage COPD triggers Provided instruction about proper use of medications used for management of COPD including inhalers Advised patient to self assesses COPD action plan zone and make appointment with provider if in the yellow zone for 48 hours without improvement Advised patient to engage in light exercise as tolerated 3-5 days a week to aid in the the management of COPD Discussed the importance of adequate rest and management of fatigue with COPD Assessed social determinant of health barriers Reinforced COPD action  plan Verified patient does have oxygen and using as prescribed 2 liters via [place], 24 hours per day, pt is checking 02 saturation daily Reviewed all upcoming scheduled appointments including primary care provider on 4/10, cardiologist 4/21, pulmonologist 4/22 Reviewed plan of care including case closure, pt declines further outreach Reviewed carbohydrate modified diet  Patient Goals/Self-Care Activities: Participate in Transition of Care Program/Attend TOC scheduled calls Notify RN Care Manager of TOC call rescheduling needs Take all medications as prescribed Attend all scheduled provider appointments Call pharmacy for medication refills 3-7 days in advance of running out of medications Attend church or other social activities Perform all self care activities independently  Perform IADL's (shopping, preparing meals, housekeeping, managing finances) independently Call provider office for new concerns or questions  identify and remove indoor air pollutants listen for public air quality announcements every day do breathing exercises every day develop a rescue plan eat healthy/prescribed diet: heart healthy, carbohydrate modified get at least 7 to 8 hours of sleep at night Use oxygen and CPAP as prescribed Check oxygen saturation daily Follow carbohydrate modified diet Case closure today  No further follow up         Please call the care guide team at [phone] if you need to cancel or reschedule your appointment.   If you are experiencing a Mental Health or Behavioral Health Crisis or need someone to talk to, please call the Suicide and Crisis Lifeline: 988 call the [place] National Suicide Prevention Lifeline: 289-871-9640 or TTY: 705-375-6544 TTY 949-211-3533) to talk to a trained counselor call 1-800-273-TALK (  toll free, 24 hour hotline) go to [facility] Urgent [facility] [address], [place] [phone]) call the [facility] Crisis Line:  [phone] call 911   Patient verbalizes understanding of instructions and care plan provided today and agrees to view in MyChart. Active MyChart status and patient understanding of how to access instructions and care plan via MyChart confirmed with patient.     Irving Shows [facility], BSN RN Care Manager/ Transition of Care / [facility] [phone]

## 2023-04-08 NOTE — Transitions of Care (Post Inpatient/ED Visit) (Signed)
   04/08/2023  Name: Erica Braun MRN: [id number] DOB: Nov 30, 1950   ERRONEOUS Neta Mends

## 2023-04-08 NOTE — Patient Outreach (Signed)
 Care Management  Transitions of Care Program Transitions of Care Post-discharge week 2   04/08/2023 Name: Erica Braun MRN: [id number] DOB: 12/21/1950  Subjective: Erica Braun is a 73 y.o. year old female who is a primary care patient of Junie Spencer, [name]. The Care Management team Engaged with patient Engaged with patient by telephone to assess and address transitions of care needs.   Consent to Services:  Patient was given information about care management services, agreed to services, and gave verbal consent to participate.   Assessment: Pt reports she is doing well, taking medications as prescribed, using oxygen and CPAP as prescribed, continues checking CBG as directed with today's reading 187, has finished prednisone and antibiotics, no new concerns reported.  Patient declines further follow up.         SDOH Interventions    Flowsheet Row Telephone from 03/31/2023 in [place] POPULATION HEALTH DEPARTMENT Telephone from 03/27/2023 in [place] POPULATION HEALTH DEPARTMENT Clinical Support from 12/09/2021 in [facility] Office Visit from 08/05/2021 in [facility] Visit from 07/17/2021 in [facility] Clinical Support from 12/05/2020 in [facility]  SDOH Interventions        Food Insecurity Interventions Intervention Not Indicated Intervention Not Indicated Intervention Not Indicated -- -- Intervention Not Indicated  Housing Interventions Intervention Not Indicated Intervention Not Indicated Intervention Not Indicated -- -- Intervention Not Indicated  Transportation Interventions Intervention Not Indicated Intervention Not Indicated, Patient Resources (Friends/Family) Intervention Not Indicated -- -- Intervention Not Indicated  Utilities Interventions Intervention Not Indicated Intervention Not Indicated Intervention Not  Indicated -- -- --  Alcohol Usage Interventions -- -- Intervention Not Indicated (Score <7) -- -- --  Depression Interventions/Treatment  -- -- -- PHQ2-9 Score <4 Follow-up Not Indicated PHQ2-9 Score <4 Follow-up Not Indicated --  Financial Strain Interventions -- -- Intervention Not Indicated -- -- Intervention Not Indicated  Physical Activity Interventions -- -- Intervention Not Indicated -- -- Intervention Not Indicated  Stress Interventions -- -- Intervention Not Indicated -- -- Intervention Not Indicated  Social Connections Interventions -- -- Intervention Not Indicated -- -- Intervention Not Indicated        Goals Addressed             This Visit's Progress    COMPLETED: TOC care plan/ pt will have no readmission within 30 days       Current Barriers:  Knowledge Deficits related to plan of care for management of COPD  Patient reports feeling better, continues using oxygen as prescribed, CBG today 187, pt states " this is slowly coming down since I've finished prednisone",  pt has finished antibiotic and prednisone, pt is using CPAP nightly with oxygen, breathing well, reports has all medications and taking as prescribed, no new concerns reported.  RNCM Clinical Goal(s):  Patient will verbalize understanding of plan for management of COPD as evidenced by pt report, review of EMR take all medications exactly as prescribed and will call provider for medication related questions as evidenced by pt report, review of EMR and through collaboration with RN Care manager, provider, and care team.   Interventions: Evaluation of current treatment plan related to  self management and patient's adherence to plan as established by provider   COPD Interventions:  (Status:  New goal. and Goal on track:  Yes.) Short Term Goal Advised patient to track and manage COPD triggers Provided instruction about proper use  of medications used for management of COPD including inhalers Advised patient to  self assesses COPD action plan zone and make appointment with provider if in the yellow zone for 48 hours without improvement Advised patient to engage in light exercise as tolerated 3-5 days a week to aid in the the management of COPD Discussed the importance of adequate rest and management of fatigue with COPD Assessed social determinant of health barriers Reinforced COPD action plan Verified patient does have oxygen and using as prescribed 2 liters via [place], 24 hours per day, pt is checking 02 saturation daily Reviewed all upcoming scheduled appointments including primary care provider on 4/10, cardiologist 4/21, pulmonologist 4/22 Reviewed plan of care including case closure, pt declines further outreach Reviewed carbohydrate modified diet  Patient Goals/Self-Care Activities: Participate in Transition of Care Program/Attend TOC scheduled calls Notify RN Care Manager of TOC call rescheduling needs Take all medications as prescribed Attend all scheduled provider appointments Call pharmacy for medication refills 3-7 days in advance of running out of medications Attend church or other social activities Perform all self care activities independently  Perform IADL's (shopping, preparing meals, housekeeping, managing finances) independently Call provider office for new concerns or questions  identify and remove indoor air pollutants listen for public air quality announcements every day do breathing exercises every day develop a rescue plan eat healthy/prescribed diet: heart healthy, carbohydrate modified get at least 7 to 8 hours of sleep at night Use oxygen and CPAP as prescribed Check oxygen saturation daily Follow carbohydrate modified diet Case closure today  No further follow up        Plan: The patient has been provided with contact information for the care management team and has been advised to call with any health related questions or concerns.   Irving Shows [facility], BSN RN  Care Manager/ Transition of Care [place]/ [facility] [phone]

## 2023-04-08 NOTE — Patient Outreach (Deleted)
 Care Coordination   Follow Up Visit Note   04/08/2023 Name: Erica Braun MRN: [id number] DOB: [birth year]  Erica Braun is a 73 y.o. year old female who sees [place], Edilia Bo, [name] for primary care. I spoke with  Erica Braun by phone today. Patient reports she is doing well, finished antibiotics, prednisone, continues using oxygen as prescribed, reports has all medications and taking as prescribed, CBG today 187, pt feels CBG is normalizing now since finishing prednisone, no new concerns voiced.    Care Coordination Interventions:  Yes, provided  Reviewed COPD action plan, importance of calling doctor early on for change in health status, symptoms Reviewed carbohydrate modified diet Reviewed all upcoming scheduled appointments including primary care provider on 04/16/23 Reviewed plan of care including case closure today, pt verbalizes understanding  Follow up plan: No further intervention required.   Encounter Outcome:  Patient Visit Completed   Irving Shows [facility], BSN RN Care Manager/ Transition of Care [place]/ [facility] [phone]

## 2023-04-16 ENCOUNTER — Ambulatory Visit: Admitting: Family

## 2023-04-16 ENCOUNTER — Encounter: Payer: Self-pay | Admitting: Family

## 2023-04-16 VITALS — BP 133/65 | HR 76 | Temp 97.1°F | Ht 61.0 in | Wt 138.4 lb

## 2023-04-16 DIAGNOSIS — E1165 Type 2 diabetes mellitus with hyperglycemia: Secondary | ICD-10-CM

## 2023-04-16 DIAGNOSIS — E1159 Type 2 diabetes mellitus with other circulatory complications: Secondary | ICD-10-CM

## 2023-04-16 DIAGNOSIS — I152 Hypertension secondary to endocrine disorders: Secondary | ICD-10-CM | POA: Diagnosis not present

## 2023-04-16 DIAGNOSIS — J441 Chronic obstructive pulmonary disease with (acute) exacerbation: Secondary | ICD-10-CM

## 2023-04-16 DIAGNOSIS — J431 Panlobular emphysema: Secondary | ICD-10-CM

## 2023-04-16 DIAGNOSIS — Z09 Encounter for follow-up examination after completed treatment for conditions other than malignant neoplasm: Secondary | ICD-10-CM

## 2023-04-16 DIAGNOSIS — D649 Anemia, unspecified: Secondary | ICD-10-CM | POA: Diagnosis not present

## 2023-04-16 LAB — CBC WITH DIFFERENTIAL/PLATELET
Basophils Absolute: 0 10*3/uL (ref 0.0–0.2)
Basos: 0 %
EOS (ABSOLUTE): 0.1 10*3/uL (ref 0.0–0.4)
Eos: 1 %
Hematocrit: 32.5 % — ABNORMAL LOW (ref 34.0–46.6)
Hemoglobin: 10 g/dL — ABNORMAL LOW (ref 11.1–15.9)
Immature Grans (Abs): 0 10*3/uL (ref 0.0–0.1)
Immature Granulocytes: 0 %
Lymphocytes Absolute: 0.8 10*3/uL (ref 0.7–3.1)
Lymphs: 22 %
MCH: 26.6 pg (ref 26.6–33.0)
MCHC: 30.8 g/dL — ABNORMAL LOW (ref 31.5–35.7)
MCV: 86 fL (ref 79–97)
Monocytes Absolute: 0.3 10*3/uL (ref 0.1–0.9)
Monocytes: 9 %
Neutrophils Absolute: 2.4 10*3/uL (ref 1.4–7.0)
Neutrophils: 68 %
Platelets: 118 10*3/uL — ABNORMAL LOW (ref 150–450)
RBC: 3.76 x10E6/uL — ABNORMAL LOW (ref 3.77–5.28)
RDW: 15.3 % (ref 11.7–15.4)
WBC: 3.6 10*3/uL (ref 3.4–10.8)

## 2023-04-16 LAB — CMP14+EGFR
ALT: 11 IU/L (ref 0–32)
AST: 10 IU/L (ref 0–40)
Albumin: 4.2 g/dL (ref 3.8–4.8)
Alkaline Phosphatase: 80 IU/L (ref 44–121)
BUN/Creatinine Ratio: 16 (ref 12–28)
BUN: 11 mg/dL (ref 8–27)
Bilirubin Total: 0.4 mg/dL (ref 0.0–1.2)
CO2: 24 mmol/L (ref 20–29)
Calcium: 9.6 mg/dL (ref 8.7–10.3)
Chloride: 104 mmol/L (ref 96–106)
Creatinine, Ser: 0.7 mg/dL (ref 0.57–1.00)
Globulin, Total: 2.2 g/dL (ref 1.5–4.5)
Glucose: 100 mg/dL — ABNORMAL HIGH (ref 70–99)
Potassium: 4.3 mmol/L (ref 3.5–5.2)
Sodium: 142 mmol/L (ref 134–144)
Total Protein: 6.4 g/dL (ref 6.0–8.5)
eGFR: 91 mL/min/{1.73_m2} (ref >59)

## 2023-04-16 NOTE — Progress Notes (Signed)
 Subjective:    Patient ID: Erica Braun, female    DOB: [birth year], 73 y.o.   MRN: [id number]  Chief Complaint  Patient presents with   Transitions Of Care   Today's visit was for Transitional Care Management.  The patient was discharged from [facility] on 03/30/23 with a primary diagnosis of COPD.   Contact with the patient and/or caregiver, by a clinical staff member, was made on 03/31/23 and was documented as a telephone encounter within the EMR.  Through chart review and discussion with the patient I have determined that management of their condition is of moderate complexity.   She went to the ED on 03/27/23 with Flu A and COPD. She was originally admitted 03/24/23-03/26/23 with acute respiratory failure and COPD exacerbation. She was discharged home and she started having tachycardia and O2 saturation in the 80's. She was given 2 l of O2, bronchodilators and solu-medrol. She was discharged home on prednisone. She completed this. Reports her breathing is much better.   She has COPD and quit smoking approx 15 years ago. She is taking Advair BID. Has follow up with Pulmonologist 04/28/23.  Diabetes She presents for her follow-up diabetic visit. She has type 2 diabetes mellitus. Pertinent negatives for diabetes include no blurred vision and no foot paresthesias. Risk factors for coronary artery disease include dyslipidemia, diabetes mellitus, sedentary lifestyle and post-menopausal. Her overall blood glucose range is 140-180 mg/dl.      Review of Systems  Eyes:  Negative for blurred vision.  All other systems reviewed and are negative.   Social History   Socioeconomic History   Marital status: Married    Spouse name: Erica Braun   Number of children: 3   Years of education: 14   Highest education level: Some college, no degree  Occupational History   Occupation: retired    Comment: Radiation protection practitioner  Tobacco Use   Smoking status: Former    Current packs/day: 0.00     Average packs/day: 1.5 packs/day for 41.0 years (61.5 ttl pk-yrs)    Types: Cigarettes    Start date: 1969    Quit date: 2010    Years since quitting: 15.2   Smokeless tobacco: Never  Vaping Use   Vaping status: Never Used  Substance and Sexual Activity   Alcohol use: Yes    Comment: wine occasionaly   Drug use: No   Sexual activity: Yes    Birth control/protection: Post-menopausal  Other Topics Concern   Not on file  Social History Narrative   Recent move from [place] to walk   Married to [name] dog at home   Social Drivers of [employer]: Low Risk  (03/26/2023)   Overall Financial Resource Strain (CARDIA)    Difficulty of Paying Living Expenses: Not hard at all  Food Insecurity: No Food Insecurity (03/31/2023)   Hunger Vital Sign    Worried About Running Out of Food in the Last Year: Never true    Ran Out of Food in the Last Year: Never true  Transportation Needs: No Transportation Needs (03/31/2023)   PRAPARE - Administrator, Civil Service (Medical): No    Lack of Transportation (Non-Medical): No  Physical Activity: Insufficiently Active (03/26/2023)   Exercise Vital Sign    Days of Exercise per Week: 3 days    Minutes of Exercise per Session: 20 min  Stress: No Stress Concern Present (03/26/2023)   [employer] of  Occupational Health - Occupational Stress Questionnaire    Feeling of Stress : Only a little  Social Connections: Socially Integrated (03/28/2023)   Social Connection and Isolation Panel [NHANES]    Frequency of Communication with Friends and Family: More than three times a week    Frequency of Social Gatherings with Friends and Family: More than three times a week    Attends Religious Services: More than 4 times per year    Active Member of [employer] or Organizations: Yes    Attends Engineer, structural: More than 4 times per year    Marital Status: Married   Family History  Problem Relation Age of  Onset   Breast cancer Mother        dx 56s; mets   Arthritis Mother    COPD Father 73       emphysema   Alcohol abuse Father    Diabetes Father    Breast cancer Sister        dx late 47s; mat half sister   Early death Brother        overdose   Drug abuse Brother    Alcohol abuse Paternal Aunt    COPD Paternal Aunt    Throat cancer Paternal Uncle    Liver cancer Maternal Grandmother        or other primary; d. late 37s   Diabetes Maternal Grandmother    Alcohol abuse Maternal Grandfather    Early death Maternal Grandfather    Stroke Paternal Grandmother    Heart disease Paternal Grandfather    Esophageal cancer Neg Hx    Stomach cancer Neg Hx    Colon cancer Neg Hx         Objective:   Physical Exam Vitals reviewed.  Constitutional:      General: She is not in acute distress.    Appearance: She is well-developed.  HENT:     Head: Normocephalic and atraumatic.  Eyes:     Pupils: Pupils are equal, round, and reactive to light.  Neck:     Thyroid: No thyromegaly.  Cardiovascular:     Rate and Rhythm: Normal rate and regular rhythm.     Heart sounds: Normal heart sounds. No murmur heard. Pulmonary:     Effort: Pulmonary effort is normal. No respiratory distress.     Breath sounds: Normal breath sounds. No wheezing.  Abdominal:     General: Bowel sounds are normal. There is no distension.     Palpations: Abdomen is soft.     Tenderness: There is no abdominal tenderness.  Musculoskeletal:        General: No tenderness. Normal range of motion.     Cervical back: Normal range of motion and neck supple.  Skin:    General: Skin is warm and dry.  Neurological:     Mental Status: She is alert and oriented to person, place, and time.     Cranial Nerves: No cranial nerve deficit.     Deep Tendon Reflexes: Reflexes are normal and symmetric.  Psychiatric:        Behavior: Behavior normal.        Thought Content: Thought content normal.        Judgment: Judgment normal.        BP 133/65   Pulse 76   Temp (!) 97.1 F (36.2 C) (Temporal)   Ht 5\' 1"  (1.549 m)   Wt 138 lb 6.4 oz (62.8 kg)   SpO2 96%   BMI  26.15 kg/m      Assessment & Plan:  Erica Braun comes in today with chief complaint of Transitions Of Care   Diagnosis and orders addressed:  1. Hypertension associated with diabetes (HCC) (Primary) - CMP14+EGFR - CBC with Differential/Platelet  2. Panlobular emphysema (HCC) - CMP14+EGFR - CBC with Differential/Platelet  3. Type 2 diabetes mellitus with hyperglycemia, without long-term current use of insulin (HCC) - Microalbumin / creatinine urine ratio - CMP14+EGFR - CBC with Differential/Platelet  4. Hospital discharge follow-up Labs pending Continue current medications  Keep follow up with specialists  Health Maintenance reviewed Diet and exercise encouraged  Return if symptoms worsen or fail to improve.    [name], [name]

## 2023-04-16 NOTE — Patient Instructions (Signed)
 Chronic Obstructive Pulmonary Disease Exacerbation  Chronic obstructive pulmonary disease (COPD) is a long-term (chronic) lung problem. When you have COPD, it can feel harder to breathe in or out. COPD exacerbation is a flare-up of symptoms when breathing gets worse and more treatment may be needed. Without treatment, flare-ups can be life-threatening. If they happen often, your lungs can become more damaged. What are the causes? Not taking your usual COPD medicines as told by your health care provider. A cold or the flu, which can cause infection in your lungs. Being exposed to things that make your breathing worse, such as: Smoke. Air pollution. Fumes. Dust. Allergies. Weather changes. What are the signs or symptoms? Symptoms do not get better or get worse even if you take your medicines as told by your provider. Symptoms may include: More shortness of breath. You may only be able to speak one or two words at a time. More coughing or mucus from your lungs. More wheezing or chest tightness. Being more tired and having less energy. Confusion. How is this diagnosed? This condition is diagnosed based on: Symptoms that get worse. Your medical history. A physical exam. You may also have tests, including: A chest X-ray. Blood or mucus tests. How is this treated? You may be able to stay home or you may need to go to the hospital. Treatment may include: Taking medicines. These may include: Inhalers. These have medicines in them that you breathe in. These may be more of what you already take or they may be new. Steroids. These reduce inflammation in the airways. These may be inhaled, taken by mouth, or given in an IV. Antibiotics. These treat infection. Using oxygen. Using a device to help you clear mucus. Follow these instructions at home: Medicines Take your medicines only as told by your provider. If you were given antibiotics or steroids, take them as told by your provider. Do  not stop taking them even if you start to feel better. Lifestyle Several times a day, wash your hands with soap and water for at least 20 seconds. If you cannot use soap and water, use hand sanitizer. This may help keep you from getting an infection. Avoid being around crowds or people who are sick. Do not smoke or use any products that contain nicotine or tobacco. If you need help quitting, ask your provider. Return to your normal activities when your provider says that it's safe. Use breathing methods to control your stress and catch your breath. How is this prevented? Follow your COPD action plan. The action plan tells you what to do if you're feeling good and what to do when you start feeling worse. Discuss the plan often with your provider. Make sure you get all the shots, also called vaccines, that your provider recommends. Ask your provider about a flu shot and a pneumonia shot. Use oxygen therapy if told by your provider. If you need home oxygen therapy, ask your provider how often to check your oxygen level with a device called an oximeter. Keep all follow-up visits to review your COPD action plan. Your provider will want to check on your condition often to keep you healthy and out of the hospital. Contact a health care provider if: Your COPD symptoms get worse. You have a fever or chills. You have trouble doing daily activities. You have trouble breathing even when you are resting. Get help right away if: You are short of breath and cannot: Talk in full sentences. Do normal activities. You have chest  pain. You feel confused. These symptoms may be an emergency. Call 911 right away. Do not wait to see if the symptoms will go away. Do not drive yourself to the hospital. This information is not intended to replace advice given to you by your health care provider. Make sure you discuss any questions you have with your health care provider. Document Revised: 09/25/2022 Document  Reviewed: 03/10/2022 Elsevier Patient Education  2024 [employer].

## 2023-04-17 ENCOUNTER — Other Ambulatory Visit: Payer: Self-pay | Admitting: Family

## 2023-04-17 DIAGNOSIS — B3731 Acute candidiasis of vulva and vagina: Secondary | ICD-10-CM

## 2023-04-17 LAB — MICROALBUMIN / CREATININE URINE RATIO
Creatinine, Urine: 25.6 mg/dL
Microalb/Creat Ratio: <12 mg/g{creat} (ref 0–29)
Microalbumin, Urine: <3 ug/mL

## 2023-04-20 NOTE — Progress Notes (Signed)
 CARDIOLOGY CONSULT NOTE       Patient ID: Erica Braun MRN: [id number] DOB/AGE: Mar 09, 1950 73 y.o.  Referring Physician: Matilde Son Primary Physician: Yevette Hem, [name] Primary Cardiologist: Stann Earnest   HPI:  73 y.o. referred by Dr Matilde Son for chest pain and dyspnea. First seen on 07/10/21  Sees pulmonary for asthma/COPD Quit smoking 15 years ago Uses CPAP For OSA  Has history of stage one esophageal cancer followed at [facility] Last EGD 01/2021 with no cancer and dilatation performed She had some chest symptoms on diagnosis of this She is diabetic with HTN and HLD.    Previously seen by Dr Concepcion Deck Cath by Dr Felipe Horton 06/12/17 with CTO RCA with left to right collaterals and 60% D1 stenosis EF 55% Medical Rx recommended She was prescribed imdur  but for some reason this was stopped on her last pulmonary visit Prior to cath had an abnormal myovue with moderate mixed defect in inferior wall EF 52%   She has been having exertional dyspnea but denies chest pain Dyspnea better after pollen season and when not in humidity like when she saw her brother in [place] who owns a vinyard Has OSA and uses CPAP  Lipitor giving her bad leg pains Changed to crestor  July 2023 improved and LDL at goal 69 12/10/21   Still with some dysphagia from esophageal cancer   She is being w/u for abnormal right breast mass Had right breast lumpectomy with Dr Afton Horse 01/15/22  Carotids 07/15/22 with 50-69% right ICA ? Left subclavian steal.   Has new lung nodules on CT suspicious for cancer repeat done 12/26/22 initially delayed reading finally read 01/08/23 Lung-RADS 4B suspicious LLL 9.5 mm nodule.  PET done 01/18/23 and not clearly positive for cancer Do for f/u CT soon with f/u [facility] and Dr Gaynell Keeler. She has OSA and uses CPAP Admitted 03/30/23 with respiratory failure from Flu A and her COPD Rx with steroid taper and 2 L's oxygen by    She had a nice trip to [place] for nephews wedding.   She has 40 mmHg lower BP in left arm  with no symptoms carotid duplex 01/26/23 with plaque no ICA stenosis vert on left described as retrograde but subclavian velocity recorded only 1.03/msec  TTE done 03/28/23 EF 60-65% RV normal normal valves    ROS All other systems reviewed and negative except as noted above  Past Medical History:  Diagnosis Date   Complication of anesthesia    COPD (chronic obstructive pulmonary disease) (HCC)    Coronary artery disease    Diabetes mellitus without complication (HCC)    Diverticulosis    Early cataracts, bilateral 11/21/2016   Esophageal cancer (HCC)    GERD (gastroesophageal reflux disease)    Glaucoma    patient denies   Hyperlipidemia    Hypertension    Internal hemorrhoids    Osteoporosis 12/16/2021   Pneumonia    PONV (postoperative nausea and vomiting)    Sleep apnea    uses Cpap   Spasm of the cricopharyngeus muscle    Squamous cell esophageal cancer (HCC) 05/10/2018    Family History  Problem Relation Age of Onset   Breast cancer Mother        dx 26s; mets   Arthritis Mother    COPD Father 72       emphysema   Alcohol abuse Father    Diabetes Father    Breast cancer Sister        dx late 49s; mat half  sister   Early death Brother        overdose   Drug abuse Brother    Alcohol abuse Paternal Aunt    COPD Paternal Aunt    Throat cancer Paternal Uncle    Liver cancer Maternal Grandmother        or other primary; d. late 83s   Diabetes Maternal Grandmother    Alcohol abuse Maternal Grandfather    Early death Maternal Grandfather    Stroke Paternal Grandmother    Heart disease Paternal Grandfather    Esophageal cancer Neg Hx    Stomach cancer Neg Hx    Colon cancer Neg Hx     Social History   Socioeconomic History   Marital status: Married    Spouse name: [name]   Number of children: 3   Years of education: 14   Highest education level: Some college, no degree  Occupational History   Occupation: retired    Comment: Radiation protection practitioner  Tobacco Use    Smoking status: Former    Current packs/day: 0.00    Average packs/day: 1.5 packs/day for 41.0 years (61.5 ttl pk-yrs)    Types: Cigarettes    Start date: 1969    Quit date: 2010    Years since quitting: 15.3   Smokeless tobacco: Never  Vaping Use   Vaping status: Never Used  Substance and Sexual Activity   Alcohol use: Not Currently    Comment: wine occasionaly   Drug use: No   Sexual activity: Yes    Birth control/protection: Post-menopausal  Other Topics Concern   Not on file  Social History Narrative   Recent move from [place]    Likes to walk   Married to [name] dog at home   Social Drivers of Health   Financial Resource Strain: Low Risk  (03/26/2023)   Overall Financial Resource Strain (CARDIA)    Difficulty of Paying Living Expenses: Not hard at all  Food Insecurity: No Food Insecurity (03/31/2023)   Hunger Vital Sign    Worried About Running Out of Food in the Last Year: Never true    Ran Out of Food in the Last Year: Never true  Transportation Needs: No Transportation Needs (03/31/2023)   PRAPARE - Administrator, Civil Service (Medical): No    Lack of Transportation (Non-Medical): No  Physical Activity: Insufficiently Active (03/26/2023)   Exercise Vital Sign    Days of Exercise per Week: 3 days    Minutes of Exercise per Session: 20 min  Stress: No Stress Concern Present (03/26/2023)   [employer] of Occupational Health - Occupational Stress Questionnaire    Feeling of Stress : Only a little  Social Connections: Socially Integrated (03/28/2023)   Social Connection and Isolation Panel [NHANES]    Frequency of Communication with Friends and Family: More than three times a week    Frequency of Social Gatherings with Friends and Family: More than three times a week    Attends Religious Services: More than 4 times per year    Active Member of [employer] or Organizations: Yes    Attends Banker Meetings: More than 4 times per year     Marital Status: Married  Catering manager Violence: Not At Risk (03/31/2023)   Humiliation, Afraid, Rape, and Kick questionnaire    Fear of Current or Ex-Partner: No    Emotionally Abused: No    Physically Abused: No    Sexually Abused: No    Past  Surgical History:  Procedure Laterality Date   ABDOMINAL HYSTERECTOMY     cervical dysplasia   BIOPSY  05/03/2018   Procedure: BIOPSY;  Surgeon: Normie Becton., MD;  Location: [facility] ENDOSCOPY;  Service: Gastroenterology;;   BREAST BIOPSY Left    BREAST BIOPSY Right 11/27/2021   MM RT BREAST BX W LOC DEV 1ST LESION IMAGE BX SPEC STEREO GUIDE 11/27/2021 [facility] MAMMOGRAPHY   BREAST BIOPSY  01/14/2022   MM RT RADIOACTIVE SEED LOC MAMMO GUIDE 01/14/2022 [facility] MAMMOGRAPHY   BREAST LUMPECTOMY Left    BREAST LUMPECTOMY WITH RADIOACTIVE SEED LOCALIZATION Right 01/15/2022   Procedure: RIGHT BREAST LUMPECTOMY WITH RADIOACTIVE SEED LOCALIZATION;  Surgeon: Sim Dryer, MD;  Location: [facility] OR;  Service: General;  Laterality: Right;   ESOPHAGOGASTRODUODENOSCOPY (EGD) WITH PROPOFOL  N/A 05/03/2018   Procedure: ESOPHAGOGASTRODUODENOSCOPY (EGD) WITH PROPOFOL ;  Surgeon: Normie Becton., MD;  Location: [facility] ENDOSCOPY;  Service: Gastroenterology;  Laterality: N/A;   ESOPHAGOGASTRODUODENOSCOPY (EGD) WITH PROPOFOL  N/A 11/18/2018   Procedure: ESOPHAGOGASTRODUODENOSCOPY (EGD) WITH PROPOFOL ;  Surgeon: Tobin Forts, MD;  Location: [facility] ENDOSCOPY;  Service: Endoscopy;  Laterality: N/A;   EUS  05/03/2018   Procedure: UPPER ENDOSCOPIC ULTRASOUND (EUS) RADIAL;  Surgeon: Normie Becton., MD;  Location: [facility] ENDOSCOPY;  Service: Gastroenterology;;   FOREIGN BODY REMOVAL  11/18/2018   Procedure: FOREIGN BODY REMOVAL;  Surgeon: Tobin Forts, MD;  Location: [facility] ENDOSCOPY;  Service: Endoscopy;;   LEFT HEART CATH AND CORONARY ANGIOGRAPHY N/A 06/11/2017   Procedure: LEFT HEART CATH AND CORONARY ANGIOGRAPHY;  Surgeon: Arty Binning, MD;  Location: [facility] INVASIVE CV LAB;   Service: Cardiovascular;  Laterality: N/A;      Current Outpatient Medications:    acetaminophen  (TYLENOL ) 500 MG tablet, Take 500 mg by mouth every 6 (six) hours as needed for mild pain (pain score 1-3)., Disp: , Rfl:    aspirin  EC 81 MG tablet, Take 81 mg by mouth in the morning., Disp: , Rfl:    atenolol  (TENORMIN ) 25 MG tablet, Take 0.5 tablets (12.5 mg total) by mouth 2 (two) times daily., Disp: , Rfl:    empagliflozin  (JARDIANCE ) 10 MG TABS tablet, Take 1 tablet (10 mg total) by mouth daily before breakfast., Disp: 90 tablet, Rfl: 0   famotidine  (PEPCID ) 40 MG tablet, Take 1 tablet (40 mg total) by mouth at bedtime., Disp: 90 tablet, Rfl: 3   fluticasone -salmeterol (ADVAIR) 250-50 MCG/ACT AEPB, Inhale 1 puff into the lungs every 12 (twelve) hours., Disp: 60 each, Rfl: 11   glimepiride  (AMARYL ) 2 MG tablet, Take 1 tablet (2 mg total) by mouth daily as needed (blood sugar more than 200.)., Disp: 90 tablet, Rfl: 1   ipratropium-albuterol  (DUONEB) 0.5-2.5 (3) MG/3ML SOLN, Take 3 mLs by nebulization every 8 (eight) hours., Disp: 360 mL, Rfl: 1   isosorbide  mononitrate (IMDUR ) 30 MG 24 hr tablet, Take 0.5 tablets (15 mg total) by mouth daily., Disp: 30 tablet, Rfl: 0   metFORMIN  (GLUCOPHAGE ) 1000 MG tablet, Take 1 tablet (1,000 mg total) by mouth 2 (two) times daily with a meal., Disp: 200 tablet, Rfl: 0   nitroGLYCERIN  (NITROSTAT ) 0.4 MG SL tablet, Place 1 tablet (0.4 mg total) under the tongue every 5 (five) minutes as needed for chest pain., Disp: 25 tablet, Rfl: 3   omeprazole  (PRILOSEC) 40 MG capsule, Take 1 capsule (40 mg total) by mouth daily., Disp: 180 capsule, Rfl: 0   rosuvastatin  (CRESTOR ) 10 MG tablet, Take 1 tablet by mouth once daily, Disp: 90 tablet, Rfl: 3  calcium  carbonate (OSCAL) 1500 (600 Ca) MG TABS tablet, Take 600 mg by mouth in the morning and at bedtime. (Patient not taking: Reported on 03/31/2023), Disp: , Rfl:    Cholecalciferol (VITAMIN D -3 PO), Take 5,000 Units by  mouth in the morning. (Patient not taking: Reported on 03/31/2023), Disp: , Rfl:    Multiple Vitamins-Minerals (ADULT ONE DAILY GUMMIES PO), Take 2 tablets by mouth in the morning. Women's Multi by [employer] (Patient not taking: Reported on 04/27/2023), Disp: , Rfl:     Physical Exam: Blood pressure 120/70, pulse 81, height 5\' 1"  (1.549 m), weight 138 lb 12.8 oz (63 kg), SpO2 97%.  BP 30 mmHg higher in right arm 150 systolic  Affect appropriate Healthy:  appears stated age HEENT: normal Neck supple with no adenopathy JVP normal left subclavian  bruits no thyromegaly Lungs clear with no wheezing and good diaphragmatic motion Heart:  S1/S2 no murmur, no rub, gallop or click PMI normal Abdomen: benighn, BS positve, no tenderness, no AAA no bruit.  No HSM or HJR Distal pulses intact with no bruits No edema Neuro non-focal Skin warm and dry No muscular weakness   Labs:   Lab Results  Component Value Date   WBC 3.6 04/16/2023   HGB 10.0 (L) 04/16/2023   HCT 32.5 (L) 04/16/2023   MCV 86 04/16/2023   PLT 118 (L) 04/16/2023    No results for input(s): "NA", "K", "CL", "CO2", "BUN", "CREATININE", "CALCIUM ", "PROT", "BILITOT", "ALKPHOS", "ALT", "AST", "GLUCOSE" in the last 168 hours.  Invalid input(s): "LABALBU"  No results found for: "CKTOTAL", "CKMB", "CKMBINDEX", "TROPONINI"  Lab Results  Component Value Date   CHOL 147 12/10/2021   CHOL 126 09/10/2021   CHOL 203 (H) 10/25/2020   Lab Results  Component Value Date   HDL 64 12/10/2021   HDL 54 09/10/2021   HDL 52 10/25/2020   Lab Results  Component Value Date   LDLCALC 69 12/10/2021   LDLCALC 49 09/10/2021   LDLCALC 124 (H) 10/25/2020   Lab Results  Component Value Date   TRIG 72 12/10/2021   TRIG 135 09/10/2021   TRIG 154 (H) 10/25/2020   Lab Results  Component Value Date   CHOLHDL 2.3 12/10/2021   CHOLHDL 2.3 09/10/2021   CHOLHDL 3.9 10/25/2020   No results found for: "LDLDIRECT"    Radiology: No  results found.   EKG: ST rate 133 no acute ST changes 11/19/18  04/27/2023 SR normal    ASSESSMENT AND PLAN:   CAD:  cath 2019 with CTO RCA left to right collaterals and 60% D1 stenosis Currently on ASA/Statin No myalgias on crestor  Beta blocker and nitrates added back June 2024  COPD:  f/u [place] due for f/u CT with abnormal LLL nodule and equivocal PET scan Hospitalized 03/27/23 with respiratory failure due to Influenza A Esophageal Cancer:  F/U [facility] on prilosec and pepcid  Last EGD 2021 should have f/u with Dr Bridgett Camps ? [facility] HTN:  continue losartan  given DM Change lopressor  to atenolol  25 mg due to side effects ( bad gas with lopressor  )  HLD:  LDL 69 at goal continue statin  DM:  Discussed low carb diet.  Target hemoglobin A1c is 6.5 or less.  Continue current medications.  A1c 7.0 10/25/20  Breast Cancer:  post right lumpectomy pathology negative for cancer  Blurred Vision: with funny moving sensation She has had vertigo in past but this was slightly different ECG with no afib and she indicates pulse / BP fine during episode  Carotids 01/26/23 plaque no stenosis  Left subclavian:  bruit with 30 mmHg difference in systolic BP no high velocity recorded on duplex despite retrograde vertebral flow      F/U I 6 months   Signed: Janelle Mediate 04/27/2023, 1:28 PM

## 2023-04-21 ENCOUNTER — Other Ambulatory Visit: Payer: Self-pay | Admitting: Family Medicine

## 2023-04-21 DIAGNOSIS — D649 Anemia, unspecified: Secondary | ICD-10-CM

## 2023-04-21 LAB — B12 AND FOLATE PANEL: Vitamin B-12: 386 pg/mL (ref 232–1245)

## 2023-04-21 LAB — IRON AND TIBC
Iron Saturation: 15 % (ref 15–55)
Iron: 56 ug/dL (ref 27–139)
Total Iron Binding Capacity: 366 ug/dL (ref 250–450)
UIBC: 310 ug/dL (ref 118–369)

## 2023-04-21 LAB — SPECIMEN STATUS REPORT

## 2023-04-22 ENCOUNTER — Ambulatory Visit (HOSPITAL_COMMUNITY)
Admission: RE | Admit: 2023-04-22 | Discharge: 2023-04-22 | Disposition: A | Discharge status: Home / Self Care | Source: Ambulatory Visit | Attending: Acute Care | Admitting: Acute Care

## 2023-04-22 DIAGNOSIS — J40 Bronchitis, not specified as acute or chronic: Secondary | ICD-10-CM | POA: Diagnosis not present

## 2023-04-22 DIAGNOSIS — J439 Emphysema, unspecified: Secondary | ICD-10-CM | POA: Diagnosis not present

## 2023-04-22 DIAGNOSIS — R918 Other nonspecific abnormal finding of lung field: Secondary | ICD-10-CM | POA: Diagnosis not present

## 2023-04-22 DIAGNOSIS — D71 Functional disorders of polymorphonuclear neutrophils: Secondary | ICD-10-CM | POA: Diagnosis not present

## 2023-04-22 DIAGNOSIS — I2510 Atherosclerotic heart disease of native coronary artery without angina pectoris: Secondary | ICD-10-CM | POA: Diagnosis not present

## 2023-04-27 ENCOUNTER — Ambulatory Visit: Payer: PPO | Attending: Cardiovascular Disease | Admitting: Cardiovascular Disease

## 2023-04-27 ENCOUNTER — Encounter: Payer: Self-pay | Admitting: Cardiovascular Disease

## 2023-04-27 VITALS — BP 120/70 | HR 81 | Ht 61.0 in | Wt 138.8 lb

## 2023-04-27 DIAGNOSIS — G458 Other transient cerebral ischemic attacks and related syndromes: Secondary | ICD-10-CM | POA: Diagnosis not present

## 2023-04-27 DIAGNOSIS — J431 Panlobular emphysema: Secondary | ICD-10-CM

## 2023-04-27 DIAGNOSIS — I10 Essential (primary) hypertension: Secondary | ICD-10-CM

## 2023-04-27 DIAGNOSIS — I2510 Atherosclerotic heart disease of native coronary artery without angina pectoris: Secondary | ICD-10-CM | POA: Diagnosis not present

## 2023-04-27 DIAGNOSIS — G459 Transient cerebral ischemic attack, unspecified: Secondary | ICD-10-CM

## 2023-04-27 NOTE — Patient Instructions (Signed)
 Medication Instructions:  Your physician recommends that you continue on your current medications as directed. Please refer to the Current Medication list given to you today.  *If you need a refill on your cardiac medications before your next appointment, please call your pharmacy*  Lab Work: NONE   If you have labs (blood work) drawn today and your tests are completely normal, you will receive your results only by: MyChart Message (if you have MyChart) OR A paper copy in the mail If you have any lab test that is abnormal or we need to change your treatment, we will call you to review the results.  Testing/Procedures: NONE    Follow-Up: At [facility], you and your health needs are our priority.  As part of our continuing mission to provide you with exceptional heart care, our providers are all part of one team.  This team includes your primary Cardiologist (physician) and Advanced Practice Providers or APPs (Physician Assistants and Nurse Practitioners) who all work together to provide you with the care you need, when you need it.  Your next appointment:   6 month(s)  Provider:   You may see Janelle Mediate, MD or one of the following Advanced Practice Providers on your designated Care Team:   Woodfin Hays, PA-C  [place], [place] Theotis Flake, [place]     We recommend signing up for the patient portal called "MyChart".  Sign up information is provided on this After Visit Summary.  MyChart is used to connect with patients for Virtual Visits (Telemedicine).  Patients are able to view lab/test results, encounter notes, upcoming appointments, etc.  Non-urgent messages can be sent to your provider as well.   To learn more about what you can do with MyChart, go to [email or web address].   Other Instructions Thank you for choosing [facility]!

## 2023-04-28 ENCOUNTER — Encounter: Payer: Self-pay | Admitting: Pulmonary Disease

## 2023-04-28 ENCOUNTER — Ambulatory Visit: Payer: PPO | Admitting: Pulmonary Disease

## 2023-04-28 VITALS — BP 145/73 | HR 73 | Ht 61.0 in | Wt 139.0 lb

## 2023-04-28 DIAGNOSIS — G4733 Obstructive sleep apnea (adult) (pediatric): Secondary | ICD-10-CM

## 2023-04-28 DIAGNOSIS — R918 Other nonspecific abnormal finding of lung field: Secondary | ICD-10-CM

## 2023-04-28 DIAGNOSIS — J432 Centrilobular emphysema: Secondary | ICD-10-CM | POA: Diagnosis not present

## 2023-04-28 MED ORDER — ANORO ELLIPTA 62.5-25 MCG/ACT IN AEPB: 1.0000 | INHALATION_SPRAY | Freq: Every day | RESPIRATORY_TRACT | 3 refills | Status: DC

## 2023-04-28 NOTE — Progress Notes (Signed)
 Erica Braun is              Erica Braun    [id number]    [birth year]  Primary Care Physician:Hawks, Libbie Redwood, [name]  Referring Physician: Yevette Hem, [name] [address] [place],  [place] [address]  Chief complaint:   History of obstructive sleep apnea, COPD, lung nodules  HPI:  Feels relatively well  Was recently hospitalized for influenza A infection in March  She is recovering  Currently uses Advair, she feels Advair is not working as well as when she was on Anoro  Former smoker, known to have a left lower lobe lung nodule which increased in size - She is following up for this-will be following up with Dr. Baldwin Levee in several Gross  History of squamous cell cancer of the esophagus  She is on CPAP and using CPAP regularly, CPAP pressures is 11 at present Seems to be tolerating this well  Outpatient Encounter Medications as of 04/28/2023  Medication Sig   acetaminophen  (TYLENOL ) 500 MG tablet Take 500 mg by mouth every 6 (six) hours as needed for mild pain (pain score 1-3).   aspirin  EC 81 MG tablet Take 81 mg by mouth in the morning.   atenolol  (TENORMIN ) 25 MG tablet Take 0.5 tablets (12.5 mg total) by mouth 2 (two) times daily.   calcium  carbonate (OSCAL) 1500 (600 Ca) MG TABS tablet Take 600 mg by mouth in the morning and at bedtime.   Cholecalciferol (VITAMIN D -3 PO) Take 5,000 Units by mouth in the morning.   empagliflozin  (JARDIANCE ) 10 MG TABS tablet Take 1 tablet (10 mg total) by mouth daily before breakfast.   famotidine  (PEPCID ) 40 MG tablet Take 1 tablet (40 mg total) by mouth at bedtime.   glimepiride  (AMARYL ) 2 MG tablet Take 1 tablet (2 mg total) by mouth daily as needed (blood sugar more than 200.).   ipratropium-albuterol  (DUONEB) 0.5-2.5 (3) MG/3ML SOLN Take 3 mLs by nebulization every 8 (eight) hours.   isosorbide  mononitrate (IMDUR ) 30 MG 24 hr tablet Take 0.5 tablets (15 mg total) by mouth daily.   metFORMIN  (GLUCOPHAGE ) 1000 MG  tablet Take 1 tablet (1,000 mg total) by mouth 2 (two) times daily with a meal.   Multiple Vitamins-Minerals (ADULT ONE DAILY GUMMIES PO) Take 2 tablets by mouth in the morning. Women's Multi by VitaFusion   nitroGLYCERIN  (NITROSTAT ) 0.4 MG SL tablet Place 1 tablet (0.4 mg total) under the tongue every 5 (five) minutes as needed for chest pain.   omeprazole  (PRILOSEC) 40 MG capsule Take 1 capsule (40 mg total) by mouth daily.   rosuvastatin  (CRESTOR ) 10 MG tablet Take 1 tablet by mouth once daily   umeclidinium-vilanterol (ANORO ELLIPTA ) 62.5-25 MCG/ACT AEPB Inhale 1 puff into the lungs daily.   [DISCONTINUED] fluticasone -salmeterol (ADVAIR) 250-50 MCG/ACT AEPB Inhale 1 puff into the lungs every 12 (twelve) hours.   No facility-administered encounter medications on file as of 04/28/2023.    Allergies as of 04/28/2023   (No Known Allergies)    Past Medical History:  Diagnosis Date   Complication of anesthesia    COPD (chronic obstructive pulmonary disease) (HCC)    Coronary artery disease    Diabetes mellitus without complication (HCC)    Diverticulosis    Early cataracts, bilateral 11/21/2016   Esophageal cancer (HCC)    GERD (gastroesophageal reflux disease)    Glaucoma    patient denies   Hyperlipidemia    Hypertension    Internal hemorrhoids  Osteoporosis 12/16/2021   Pneumonia    PONV (postoperative nausea and vomiting)    Sleep apnea    uses Cpap   Spasm of the cricopharyngeus muscle    Squamous cell esophageal cancer (HCC) 05/10/2018    Past Surgical History:  Procedure Laterality Date   ABDOMINAL HYSTERECTOMY     cervical dysplasia   BIOPSY  05/03/2018   Procedure: BIOPSY;  Surgeon: Normie Becton., MD;  Location: [facility] ENDOSCOPY;  Service: Gastroenterology;;   BREAST BIOPSY Left    BREAST BIOPSY Right 11/27/2021   MM RT BREAST BX W LOC DEV 1ST LESION IMAGE BX SPEC STEREO GUIDE 11/27/2021 [facility] MAMMOGRAPHY   BREAST BIOPSY  01/14/2022   MM RT RADIOACTIVE  SEED LOC MAMMO GUIDE 01/14/2022 [facility] MAMMOGR[facility]   BREAST LUMPECTOMY Left    BREAST LUMPECTOMY WITH RADIOACTIVE SEED LOCALIZATION Right 01/15/2022   Procedure: RIGHT BREAST LUMPECTOMY WITH RADIOACTIVE SEED LOCALIZATION;  Surgeon: Sim Dryer, MD;  Location: [facility] OR;  Service: General;  Laterality: Right;   ESOPHAGOGASTRODUODENOSCOPY (EGD) WITH PROPOFOL  N/A 05/03/2018   Procedure: ESOPHAGOGASTRODUODENOSCOPY (EGD) WITH PROPOFOL ;  Surgeon: Normie Becton., MD;  Location[facility] ENDOSCOPY;  Service: Gastroenterology;  Laterality: N/A;   ESOPHAGOGASTRODUODENOSCOPY (EGD) WITH PROPOFOL  N/A 11/18/2018   Procedure: ESOPHAGOGASTRODUODENOSCOPY (EGD) WITH PROPOFOL ;  Surgeon: Tobin Forts, MD;  Location: [facility] ENDOSCOPY;  Service: Endoscopy;  Laterality: N/A;   EUS  05/03/2018   Procedure: UPPER ENDOSCOPIC ULTRASOUND (EUS) RADIAL;  Surgeon: Normie Becton., MD;  Location: [facility] ENDOSCOPY;  Service: Gastroenterology;;   FOREIGN BODY REMOVAL  11/18/2018   Procedure: FOREIGN BODY REMOVAL;  Surgeon: Tobin Forts, MD;  Location: [facility] ENDOSCOPY;  Service: Endoscopy;;   LEFT HEART CATH AND CORONARY ANGIOGRAPHY N/A 06/11/2017   Procedure: LEFT HEART CATH AND CORONARY ANGIOGRAPHY;  Surgeon: Arty Binning, MD;  Location: [facility] INVASIVE CV LAB;  Service: Cardiovascular;  Laterality: N/A;    Family History  Problem Relation Age of Onset   Breast cancer Mother        dx 54s; mets   Arthritis Mother    COPD Father 76       emphysema   Alcohol abuse Father    Diabetes Father    Breast cancer Sister        dx late 6s; mat half sister   Early death Brother        overdose   Drug abuse Brother    Alcohol abuse Paternal Aunt    COPD Paternal Aunt    Throat cancer Paternal Uncle    Liver cancer Maternal Grandmother        or other primary; d. late 52s   Diabetes Maternal Grandmother    Alcohol abuse Maternal Grandfather    Early death Maternal Grandfather    Stroke Paternal Grandmother    Heart disease  Paternal Grandfather    Esophageal cancer Neg Hx    Stomach cancer Neg Hx    Colon cancer Neg Hx     Social History   Socioeconomic History   Marital status: Married    Spouse name: [name]   Number of children: 3   Years of education: 14   Highest education level: Some college, no degree  Occupational History   Occupation: retired    Comment: Radiation protection practitioner  Tobacco Use   Smoking status: Former    Current packs/day: 0.00    Average packs/day: 1.5 packs/day for 41.0 years (61.5 ttl pk-yrs)    Types: Cigarettes    Start date: 1969  Quit date: 2010    Years since quitting: 15.3   Smokeless tobacco: Never  Vaping Use   Vaping status: Never Used  Substance and Sexual Activity   Alcohol use: Not Currently    Comment: wine occasionaly   Drug use: No   Sexual activity: Yes    Birth control/protection: Post-menopausal  Other Topics Concern   Not on file  Social History Narrative   Recent move from [place]    Likes to walk   Married to [name] dog at home   Social Drivers of Health   Financial Resource Strain: Low Risk  (03/26/2023)   Overall Financial Resource Strain (CARDIA)    Difficulty of Paying Living Expenses: Not hard at all  Food Insecurity: No Food Insecurity (03/31/2023)   Hunger Vital Sign    Worried About Running Out of Food in the Last Year: Never true    Ran Out of Food in the Last Year: Never true  Transportation Needs: No Transportation Needs (03/31/2023)   PRAPARE - Administrator, Civil Service (Medical): No    Lack of Transportation (Non-Medical): No  Physical Activity: Insufficiently Active (03/26/2023)   Exercise Vital Sign    Days of Exercise per Week: 3 days    Minutes of Exercise per Session: 20 min  Stress: No Stress Concern Present (03/26/2023)   [employer] of Occupational Health - Occupational Stress Questionnaire    Feeling of Stress : Only a little  Social Connections: Socially Integrated (03/28/2023)   Social Connection  and Isolation Panel [NHANES]    Frequency of Communication with Friends and Family: More than three times a week    Frequency of Social Gatherings with Friends and Family: More than three times a week    Attends Religious Services: More than 4 times per year    Active Member of [employer] or Organizations: Yes    Attends Engineer, structural: More than 4 times per year    Marital Status: Married  Catering manager Violence: Not At Risk (03/31/2023)   Humiliation, Afraid, Rape, and Kick questionnaire    Fear of Current or Ex-Partner: No    Emotionally Abused: No    Physically Abused: No    Sexually Abused: No    Review of Systems  Respiratory:  Positive for shortness of breath.   Psychiatric/Behavioral:  Positive for sleep disturbance.     Vitals:   04/28/23 1318  BP: (!) 145/73  Pulse: 73  SpO2: 96%     Physical Exam Constitutional:      Appearance: Normal appearance.  HENT:     Head: Normocephalic.     Nose: Nose normal.     Mouth/Throat:     Mouth: Mucous membranes are moist.  Cardiovascular:     Rate and Rhythm: Normal rate and regular rhythm.     Heart sounds: No murmur heard.    No friction rub.  Pulmonary:     Effort: No respiratory distress.     Breath sounds: No stridor. No wheezing.  Musculoskeletal:     Cervical back: Tenderness present. No rigidity.  Neurological:     Mental Status: She is alert.  Psychiatric:        Mood and Affect: Mood normal.      Data Reviewed: CPAP compliance reviewed showing excellent compliance at 100% CPAP of 11, AHI of 0.3, no significant air leaks  PET scan January 2025 was reviewed  Assessment:  Obstructive sleep apnea adequately treated with CPAP  therapy  Chronic obstructive pulmonary disease - On Advair - Anoro worked better for her  Recent exacerbation of COPD with influenza A infection - Was discharged on home oxygen - Has been checking her oxygen on a regular basis and she is always above 90% - Can  discontinue oxygen supplementation  Plan/Recommendations:  Can discontinue oxygen supplementation  Prescription placed for Anoro  Continue CPAP use on a nightly basis  Follow-up for lung nodule  Follow-up in the office in about 6 months  Encouraged to call with significant concerns   Myer Artis MD [facility] 04/28/2023, 1:42 PM  CC: Yevette Hem, [name]

## 2023-04-28 NOTE — Patient Instructions (Signed)
 I will see you in about 6 months  Prescription for Anoro sent to pharmacy for you  You may discontinue oxygen use completely  Continue using your CPAP on a nightly basis  The download from the machine shows it is working well

## 2023-04-30 DIAGNOSIS — J449 Chronic obstructive pulmonary disease, unspecified: Secondary | ICD-10-CM | POA: Diagnosis not present

## 2023-05-01 ENCOUNTER — Telehealth: Payer: Self-pay

## 2023-05-01 NOTE — Telephone Encounter (Signed)
 As long as she is maintaining her sats she can stop oxygen- ok to send message to [place]

## 2023-05-01 NOTE — Telephone Encounter (Signed)
 Copied from CRM [id number]. Topic: General - Other >> May 01, 2023  9:31 AM Phil Braun wrote: Reason for CRM:   Ref Oxyden  [facility]  Pt was told by Kathaleen Pale that she could come off of her oxygen but [facility] has to have it in writing before they will pick it up. Could you please send them notification that they can come pick it up from the patients house?  The fax number is [phone].   Thank you!

## 2023-05-06 ENCOUNTER — Other Ambulatory Visit: Payer: Self-pay | Admitting: Family

## 2023-05-06 DIAGNOSIS — E1169 Type 2 diabetes mellitus with other specified complication: Secondary | ICD-10-CM

## 2023-05-06 NOTE — Telephone Encounter (Signed)
 Order to [place] oxygen faxed to [facility] at [phone]

## 2023-07-16 ENCOUNTER — Ambulatory Visit: Admitting: Family

## 2023-08-24 ENCOUNTER — Other Ambulatory Visit: Payer: Self-pay | Admitting: Pulmonary Disease

## 2023-08-25 ENCOUNTER — Ambulatory Visit: Payer: Self-pay | Admitting: Acute Care

## 2023-08-25 ENCOUNTER — Encounter: Payer: Self-pay | Admitting: Family

## 2023-08-25 ENCOUNTER — Ambulatory Visit (INDEPENDENT_AMBULATORY_CARE_PROVIDER_SITE_OTHER): Admitting: Family

## 2023-08-25 VITALS — BP 139/64 | HR 111 | Temp 97.3°F | Ht 61.0 in | Wt 139.4 lb

## 2023-08-25 DIAGNOSIS — E1169 Type 2 diabetes mellitus with other specified complication: Secondary | ICD-10-CM | POA: Diagnosis not present

## 2023-08-25 DIAGNOSIS — E1159 Type 2 diabetes mellitus with other circulatory complications: Secondary | ICD-10-CM

## 2023-08-25 DIAGNOSIS — I2510 Atherosclerotic heart disease of native coronary artery without angina pectoris: Secondary | ICD-10-CM | POA: Diagnosis not present

## 2023-08-25 DIAGNOSIS — E782 Mixed hyperlipidemia: Secondary | ICD-10-CM

## 2023-08-25 DIAGNOSIS — Z7984 Long term (current) use of oral hypoglycemic drugs: Secondary | ICD-10-CM

## 2023-08-25 DIAGNOSIS — G4733 Obstructive sleep apnea (adult) (pediatric): Secondary | ICD-10-CM

## 2023-08-25 DIAGNOSIS — J431 Panlobular emphysema: Secondary | ICD-10-CM | POA: Diagnosis not present

## 2023-08-25 DIAGNOSIS — R911 Solitary pulmonary nodule: Secondary | ICD-10-CM

## 2023-08-25 DIAGNOSIS — I152 Hypertension secondary to endocrine disorders: Secondary | ICD-10-CM

## 2023-08-25 DIAGNOSIS — K2100 Gastro-esophageal reflux disease with esophagitis, without bleeding: Secondary | ICD-10-CM | POA: Diagnosis not present

## 2023-08-25 DIAGNOSIS — Z0000 Encounter for general adult medical examination without abnormal findings: Secondary | ICD-10-CM | POA: Diagnosis not present

## 2023-08-25 DIAGNOSIS — Z0001 Encounter for general adult medical examination with abnormal findings: Secondary | ICD-10-CM | POA: Diagnosis not present

## 2023-08-25 DIAGNOSIS — D649 Anemia, unspecified: Secondary | ICD-10-CM | POA: Diagnosis not present

## 2023-08-25 LAB — LIPID PANEL

## 2023-08-25 LAB — BAYER DCA HB A1C WAIVED: HB A1C (BAYER DCA - WAIVED): 8 % — ABNORMAL HIGH (ref 4.8–5.6)

## 2023-08-25 MED ORDER — GLIMEPIRIDE 2 MG PO TABS: 2.0000 mg | ORAL_TABLET | Freq: Every day | ORAL | 1 refills | Status: AC | PRN

## 2023-08-25 MED ORDER — OMEPRAZOLE 40 MG PO CPDR
40.0000 mg | DELAYED_RELEASE_CAPSULE | Freq: Every day | ORAL | 1 refills | Status: AC
Start: 2023-08-25 — End: ?

## 2023-08-25 MED ORDER — METFORMIN HCL 1000 MG PO TABS
1000.0000 mg | ORAL_TABLET | Freq: Two times a day (BID) | ORAL | 2 refills | Status: AC
Start: 2023-08-25 — End: ?

## 2023-08-25 MED ORDER — FAMOTIDINE 40 MG PO TABS: 40.0000 mg | ORAL_TABLET | Freq: Every day | ORAL | 3 refills | Status: AC

## 2023-08-25 MED ORDER — EMPAGLIFLOZIN 10 MG PO TABS: 10.0000 mg | ORAL_TABLET | Freq: Every day | ORAL | 1 refills | Status: DC

## 2023-08-25 MED ORDER — ISOSORBIDE MONONITRATE ER 30 MG PO TB24
15.0000 mg | ORAL_TABLET | Freq: Every day | ORAL | 1 refills | Status: AC
Start: 2023-08-25 — End: ?

## 2023-08-25 NOTE — H&P (View-Only) (Signed)
 Subjective:    Patient ID: Erica Braun, female    DOB: 01/14/1950, 73 y.o.   MRN: [id number]  Chief Complaint  Patient presents with   Medical Management of Chronic Issues   Pt presents to the office today for CPE and chronic follow up.   She is followed by Cardiologists for CAD every 6 months, pulmonologist's for COPD, and GI for esophageal dysphagia and hx of squamous cell esophageal cancer. These are stable.  She had a CT lung on 04/22/23 that showed, IMPRESSION: 1. Enlarging 9 x 6 mm right upper lobe nodule laterally, concerning for bronchogenic neoplasm. Consider repeat PET-CT, tissue sampling, or at the very least three-month follow-up CT. 2. 6 mm medial basal left lower lobe nodule, unchanged since 01/16/2023, but on 12/25/2022 measured 9 mm, also was not seen in 2020. 3. 8 mm right middle lobe medial basal pleural based nodule, stable since 2020. 4. Emphysema and bronchitis. 5. Aortic and coronary artery atherosclerosis. 6. Old granulomatous disease. 7. Right posterior cortical nephrolithiasis. 8. Osteopenia, degenerative change and kyphosis thoracic spine.   CAD and takes Crestor  daily.     Has COPD and quit smoking approx 2011. Has intermittent SOB that is worse in the heat.    Has OSA and uses CPAP nightly.  Hypertension This is a chronic problem. The current episode started more than 1 year ago. The problem has been resolved since onset. The problem is controlled. Associated symptoms include shortness of breath. Pertinent negatives include no blurred vision, malaise/fatigue or peripheral edema. Risk factors for coronary artery disease include dyslipidemia and sedentary lifestyle. The current treatment provides moderate improvement. There is no history of heart failure.  Gastroesophageal Reflux She complains of belching and heartburn. This is a chronic problem. The current episode started more than 1 year ago. The problem occurs occasionally. The symptoms  are aggravated by certain foods. She has tried a PPI for the symptoms. The treatment provided moderate relief.  Diabetes She presents for her follow-up diabetic visit. She has type 2 diabetes mellitus. Pertinent negatives for diabetes include no blurred vision and no foot paresthesias. Risk factors for coronary artery disease include dyslipidemia, diabetes mellitus, hypertension, post-menopausal and sedentary lifestyle. She is following a generally healthy diet. Her overall blood glucose range is 140-180 mg/dl.  Hyperlipidemia This is a chronic problem. The current episode started more than 1 year ago. The problem is controlled. Recent lipid tests were reviewed and are normal. Associated symptoms include shortness of breath. Current antihyperlipidemic treatment includes statins. The current treatment provides moderate improvement of lipids. Risk factors for coronary artery disease include diabetes mellitus, dyslipidemia, hypertension and a sedentary lifestyle.      Review of Systems  Constitutional:  Negative for malaise/fatigue.  Eyes:  Negative for blurred vision.  Respiratory:  Positive for shortness of breath.   Gastrointestinal:  Positive for heartburn.  All other systems reviewed and are negative.  Family History  Problem Relation Age of Onset   Breast cancer Mother        dx 33s; mets   Arthritis Mother    COPD Father 22       emphysema   Alcohol abuse Father    Diabetes Father    Breast cancer Sister        dx late 15s; mat half sister   Early death Brother        overdose   Drug abuse Brother    Alcohol abuse Paternal Aunt    COPD Paternal Aunt  Throat cancer Paternal Uncle    Liver cancer Maternal Grandmother        or other primary; d. late 70s   Diabetes Maternal Grandmother    Alcohol abuse Maternal Grandfather    Early death Maternal Grandfather    Stroke Paternal Grandmother    Heart disease Paternal Grandfather    Esophageal cancer Neg Hx    Stomach cancer  Neg Hx    Colon cancer Neg Hx    Social History   Socioeconomic History   Marital status: Married    Spouse name: [name]   Number of children: 3   Years of education: 14   Highest education level: Some college, no degree  Occupational History   Occupation: retired    Comment: Radiation protection practitioner  Tobacco Use   Smoking status: Former    Current packs/day: 0.00    Average packs/day: 1.5 packs/day for 41.0 years (61.5 ttl pk-yrs)    Types: Cigarettes    Start date: 1969    Quit date: 2010    Years since quitting: 15.6   Smokeless tobacco: Never  Vaping Use   Vaping status: Never Used  Substance and Sexual Activity   Alcohol use: Not Currently    Comment: wine occasionaly   Drug use: No   Sexual activity: Yes    Birth control/protection: Post-menopausal  Other Topics Concern   Not on file  Social History Narrative   Recent move from [place]    Likes to walk   Married to [name] dog at home   Social Drivers of Health   Financial Resource Strain: Low Risk  (03/26/2023)   Overall Financial Resource Strain (CARDIA)    Difficulty of Paying Living Expenses: Not hard at all  Food Insecurity: No Food Insecurity (03/31/2023)   Hunger Vital Sign    Worried About Running Out of Food in the Last Year: Never true    Ran Out of Food in the Last Year: Never true  Transportation Needs: No Transportation Needs (03/31/2023)   PRAPARE - Administrator, Civil Service (Medical): No    Lack of Transportation (Non-Medical): No  Physical Activity: Insufficiently Active (03/26/2023)   Exercise Vital Sign    Days of Exercise per Week: 3 days    Minutes of Exercise per Session: 20 min  Stress: No Stress Concern Present (03/26/2023)   [employer] of Occupational Health - Occupational Stress Questionnaire    Feeling of Stress : Only a little  Social Connections: Socially Integrated (03/28/2023)   Social Connection and Isolation Panel    Frequency of Communication with Friends and  Family: More than three times a week    Frequency of Social Gatherings with Friends and Family: More than three times a week    Attends Religious Services: More than 4 times per year    Active Member of [employer] or Organizations: Yes    Attends Engineer, structural: More than 4 times per year    Marital Status: Married       Objective:   Physical Exam Vitals reviewed.  Constitutional:      General: She is not in acute distress.    Appearance: She is well-developed.  HENT:     Head: Normocephalic and atraumatic.     Right Ear: Tympanic membrane normal.     Left Ear: Tympanic membrane normal.  Eyes:     Pupils: Pupils are equal, round, and reactive to light.  Neck:     Thyroid :  No thyromegaly.  Cardiovascular:     Rate and Rhythm: Normal rate and regular rhythm.     Heart sounds: Normal heart sounds. No murmur heard. Pulmonary:     Effort: Pulmonary effort is normal. No respiratory distress.     Breath sounds: Normal breath sounds. No wheezing.  Abdominal:     General: Bowel sounds are normal. There is no distension.     Palpations: Abdomen is soft.     Tenderness: There is no abdominal tenderness.  Musculoskeletal:        General: No tenderness. Normal range of motion.     Cervical back: Normal range of motion and neck supple.  Skin:    General: Skin is warm and dry.  Neurological:     Mental Status: She is alert and oriented to person, place, and time.     Cranial Nerves: No cranial nerve deficit.     Deep Tendon Reflexes: Reflexes are normal and symmetric.  Psychiatric:        Behavior: Behavior normal.        Thought Content: Thought content normal.        Judgment: Judgment normal.       BP 139/64   Pulse (!) 111   Temp (!) 97.3 F (36.3 C) (Temporal)   Ht 5' 1 (1.549 m)   Wt 139 lb 6.4 oz (63.2 kg)   SpO2 95%   BMI 26.34 kg/m      Assessment & Plan:  Erica Braun comes in today with chief complaint of Medical Management of Chronic  Issues   Diagnosis and orders addressed:  1. Gastroesophageal reflux disease with esophagitis without hemorrhage - famotidine  (PEPCID ) 40 MG tablet; Take 1 tablet (40 mg total) by mouth at bedtime.  Dispense: 90 tablet; Refill: 3 - omeprazole  (PRILOSEC) 40 MG capsule; Take 1 capsule (40 mg total) by mouth daily.  Dispense: 180 capsule; Refill: 1 - CMP14+EGFR - CBC with Differential/Platelet  2. Type 2 diabetes mellitus with other specified complication, without long-term current use of insulin  (HCC) - glimepiride  (AMARYL ) 2 MG tablet; Take 1 tablet (2 mg total) by mouth daily as needed (blood sugar more than 200.).  Dispense: 90 tablet; Refill: 1 - empagliflozin  (JARDIANCE ) 10 MG TABS tablet; Take 1 tablet (10 mg total) by mouth daily before breakfast.  Dispense: 90 tablet; Refill: 1 - metFORMIN  (GLUCOPHAGE ) 1000 MG tablet; Take 1 tablet (1,000 mg total) by mouth 2 (two) times daily with a meal.  Dispense: 200 tablet; Refill: 2 - CMP14+EGFR - CBC with Differential/Platelet - Bayer DCA Hb A1c Waived  3. Annual physical exam (Primary) - CMP14+EGFR - CBC with Differential/Platelet  4. Solitary pulmonary nodule - CT Chest Wo Contrast; Future - CMP14+EGFR - CBC with Differential/Platelet  5. Hypertension associated with diabetes (HCC) - CMP14+EGFR - CBC with Differential/Platelet  6. Panlobular emphysema (HCC) - CMP14+EGFR - CBC with Differential/Platelet  7. Mixed hyperlipidemia - CMP14+EGFR - CBC with Differential/Platelet - Lipid panel  8. OSA on CPAP - CMP14+EGFR - CBC with Differential/Platelet  9. CAD in native artery - CMP14+EGFR - CBC with Differential/Platelet   Labs pending Discussed in length about follow up with Pulmonologist. Worrisome for lung cancer. Referral placed for Oncologists today. Repeat CT scan pending, pt needs biopsy.  Health Maintenance reviewed Diet and exercise encouraged  Follow up plan: 3 months    Bari Learn, [name]

## 2023-08-25 NOTE — Patient Instructions (Signed)
 Lung Nodule: What to Know A lung nodule is a small, round growth of tissue in the lung. Nodules can vary in size. Most nodules measure less than  of an inch (10 mm). Nodules bigger than 1.2 inches (3 cm) are called lung masses. Lung masses are more likely to be cancer. Nodules are less likely to be cancer. Your nodules may not be cancer if: The nodules are small and have even edges. You don't smoke. You don't have other risk factors for lung cancer. Your nodules may be cancer if: The nodules are large and have uneven edges. You smoke. You have family members who have lung cancer. A lung nodule is sometimes found during a chest X-ray or while imaging tests are done to check for other problems. What are the causes? This problem may be caused by: Infection with germs like fungus, virus, or bacteria. Cancer of the lung, or cancer that has spread to the lung from another part of the body. A mass of tissue. Swelling from problems like arthritis. A clump of blood vessels in the lung. What are the signs or symptoms? Usually, there're no symptoms. If symptoms appear, they may be from the cause of the nodule or from the swelling in the lung. For example, if the problem is caused by an infection, you may have a cough or a fever. How is this diagnosed? This problem may be diagnosed based on your health history, physical exam, and tests. Tests may include: Blood tests. A skin test to check if you have tuberculosis. Imaging tests, like chest X-ray, CT scan, or PET scan. A biopsy to check for cancer. For this test, a small sample is taken from the nodule and tested in a lab. The biopsy can be done with: A needle that's pushed into the chest. A thin, lighted device called a scope that's moved through the mouth and into the lung. Surgery to get the sample from the lung. Tests and exams may be done often for a while to help check whether the nodule is growing or getting worse. How is this  treated? Treatment for a lung nodule depends on the cause and whether the nodule may be cancer. It also depends on your risk of getting cancer. Nodules with no cancer may need no treatment. Your nodule will be checked often. If a nodule gets bigger, more tests may be done. Some nodules need to be removed. You may have surgery to remove the part of the lung where the nodule is located. If the nodule is cancer, treatments may include: High-energy X-rays (radiation). Medicines to kill cancer cells (chemotherapy). Medicines that kill cancer cells and spare healthy cells (targeted therapy). Medicines that help your body fight cancer (immunotherapy). Surgery to remove cancer cells. Follow these instructions at home: Take your medicines only as told. Do not smoke, vape, or use nicotine or tobacco. Keep all follow-up visits. Your provider may need to check your lungs often over a period of time. Contact a health care provider if: You have pain in your chest, back, or shoulder. You have trouble breathing. You have a cough or a sore throat. You become hoarse or have trouble speaking. You have a fever or chills. You don't feel like eating, or you lose weight without trying. You have night sweats. You sleep on two or more pillows to help you breathe at night. Get help right away if: You have chest pain. You can't catch your breath. You make high-pitched whistling sounds when you breathe, most  often when you breathe out. This is called wheezing. You cough up blood from your lungs. You become dizzy or you faint. These symptoms may be an emergency. Call 911 right away. Do not wait to see if the symptoms will go away. Do not drive yourself to the hospital. This information is not intended to replace advice given to you by your health care provider. Make sure you discuss any questions you have with your health care provider. Document Revised: 09/24/2022 Document Reviewed: 09/24/2022 Elsevier  Patient Education  2025 [employer].

## 2023-08-25 NOTE — Progress Notes (Signed)
 Subjective:    Patient ID: Erica Braun, female    DOB: 01/14/1950, 73 y.o.   MRN: [id number]  Chief Complaint  Patient presents with   Medical Management of Chronic Issues   Pt presents to the office today for CPE and chronic follow up.   She is followed by Cardiologists for CAD every 6 months, pulmonologist's for COPD, and GI for esophageal dysphagia and hx of squamous cell esophageal cancer. These are stable.  She had a CT lung on 04/22/23 that showed, IMPRESSION: 1. Enlarging 9 x 6 mm right upper lobe nodule laterally, concerning for bronchogenic neoplasm. Consider repeat PET-CT, tissue sampling, or at the very least three-month follow-up CT. 2. 6 mm medial basal left lower lobe nodule, unchanged since 01/16/2023, but on 12/25/2022 measured 9 mm, also was not seen in 2020. 3. 8 mm right middle lobe medial basal pleural based nodule, stable since 2020. 4. Emphysema and bronchitis. 5. Aortic and coronary artery atherosclerosis. 6. Old granulomatous disease. 7. Right posterior cortical nephrolithiasis. 8. Osteopenia, degenerative change and kyphosis thoracic spine.   CAD and takes Crestor  daily.     Has COPD and quit smoking approx 2011. Has intermittent SOB that is worse in the heat.    Has OSA and uses CPAP nightly.  Hypertension This is a chronic problem. The current episode started more than 1 year ago. The problem has been resolved since onset. The problem is controlled. Associated symptoms include shortness of breath. Pertinent negatives include no blurred vision, malaise/fatigue or peripheral edema. Risk factors for coronary artery disease include dyslipidemia and sedentary lifestyle. The current treatment provides moderate improvement. There is no history of heart failure.  Gastroesophageal Reflux She complains of belching and heartburn. This is a chronic problem. The current episode started more than 1 year ago. The problem occurs occasionally. The symptoms  are aggravated by certain foods. She has tried a PPI for the symptoms. The treatment provided moderate relief.  Diabetes She presents for her follow-up diabetic visit. She has type 2 diabetes mellitus. Pertinent negatives for diabetes include no blurred vision and no foot paresthesias. Risk factors for coronary artery disease include dyslipidemia, diabetes mellitus, hypertension, post-menopausal and sedentary lifestyle. She is following a generally healthy diet. Her overall blood glucose range is 140-180 mg/dl.  Hyperlipidemia This is a chronic problem. The current episode started more than 1 year ago. The problem is controlled. Recent lipid tests were reviewed and are normal. Associated symptoms include shortness of breath. Current antihyperlipidemic treatment includes statins. The current treatment provides moderate improvement of lipids. Risk factors for coronary artery disease include diabetes mellitus, dyslipidemia, hypertension and a sedentary lifestyle.      Review of Systems  Constitutional:  Negative for malaise/fatigue.  Eyes:  Negative for blurred vision.  Respiratory:  Positive for shortness of breath.   Gastrointestinal:  Positive for heartburn.  All other systems reviewed and are negative.  Family History  Problem Relation Age of Onset   Breast cancer Mother        dx 33s; mets   Arthritis Mother    COPD Father 22       emphysema   Alcohol abuse Father    Diabetes Father    Breast cancer Sister        dx late 15s; mat half sister   Early death Brother        overdose   Drug abuse Brother    Alcohol abuse Paternal Aunt    COPD Paternal Aunt  Throat cancer Paternal Uncle    Liver cancer Maternal Grandmother        or other primary; d. late 70s   Diabetes Maternal Grandmother    Alcohol abuse Maternal Grandfather    Early death Maternal Grandfather    Stroke Paternal Grandmother    Heart disease Paternal Grandfather    Esophageal cancer Neg Hx    Stomach cancer  Neg Hx    Colon cancer Neg Hx    Social History   Socioeconomic History   Marital status: Married    Spouse name: [name]   Number of children: 3   Years of education: 14   Highest education level: Some college, no degree  Occupational History   Occupation: retired    Comment: Radiation protection practitioner  Tobacco Use   Smoking status: Former    Current packs/day: 0.00    Average packs/day: 1.5 packs/day for 41.0 years (61.5 ttl pk-yrs)    Types: Cigarettes    Start date: 1969    Quit date: 2010    Years since quitting: 15.6   Smokeless tobacco: Never  Vaping Use   Vaping status: Never Used  Substance and Sexual Activity   Alcohol use: Not Currently    Comment: wine occasionaly   Drug use: No   Sexual activity: Yes    Birth control/protection: Post-menopausal  Other Topics Concern   Not on file  Social History Narrative   Recent move from [place]    Likes to walk   Married to [name] dog at home   Social Drivers of Health   Financial Resource Strain: Low Risk  (03/26/2023)   Overall Financial Resource Strain (CARDIA)    Difficulty of Paying Living Expenses: Not hard at all  Food Insecurity: No Food Insecurity (03/31/2023)   Hunger Vital Sign    Worried About Running Out of Food in the Last Year: Never true    Ran Out of Food in the Last Year: Never true  Transportation Needs: No Transportation Needs (03/31/2023)   PRAPARE - Administrator, Civil Service (Medical): No    Lack of Transportation (Non-Medical): No  Physical Activity: Insufficiently Active (03/26/2023)   Exercise Vital Sign    Days of Exercise per Week: 3 days    Minutes of Exercise per Session: 20 min  Stress: No Stress Concern Present (03/26/2023)   [employer] of Occupational Health - Occupational Stress Questionnaire    Feeling of Stress : Only a little  Social Connections: Socially Integrated (03/28/2023)   Social Connection and Isolation Panel    Frequency of Communication with Friends and  Family: More than three times a week    Frequency of Social Gatherings with Friends and Family: More than three times a week    Attends Religious Services: More than 4 times per year    Active Member of [employer] or Organizations: Yes    Attends Engineer, structural: More than 4 times per year    Marital Status: Married       Objective:   Physical Exam Vitals reviewed.  Constitutional:      General: She is not in acute distress.    Appearance: She is well-developed.  HENT:     Head: Normocephalic and atraumatic.     Right Ear: Tympanic membrane normal.     Left Ear: Tympanic membrane normal.  Eyes:     Pupils: Pupils are equal, round, and reactive to light.  Neck:     Thyroid :  No thyromegaly.  Cardiovascular:     Rate and Rhythm: Normal rate and regular rhythm.     Heart sounds: Normal heart sounds. No murmur heard. Pulmonary:     Effort: Pulmonary effort is normal. No respiratory distress.     Breath sounds: Normal breath sounds. No wheezing.  Abdominal:     General: Bowel sounds are normal. There is no distension.     Palpations: Abdomen is soft.     Tenderness: There is no abdominal tenderness.  Musculoskeletal:        General: No tenderness. Normal range of motion.     Cervical back: Normal range of motion and neck supple.  Skin:    General: Skin is warm and dry.  Neurological:     Mental Status: She is alert and oriented to person, place, and time.     Cranial Nerves: No cranial nerve deficit.     Deep Tendon Reflexes: Reflexes are normal and symmetric.  Psychiatric:        Behavior: Behavior normal.        Thought Content: Thought content normal.        Judgment: Judgment normal.       BP 139/64   Pulse (!) 111   Temp (!) 97.3 F (36.3 C) (Temporal)   Ht 5' 1 (1.549 m)   Wt 139 lb 6.4 oz (63.2 kg)   SpO2 95%   BMI 26.34 kg/m      Assessment & Plan:  Kathyrn Halim Siegenthaler comes in today with chief complaint of Medical Management of Chronic  Issues   Diagnosis and orders addressed:  1. Gastroesophageal reflux disease with esophagitis without hemorrhage - famotidine  (PEPCID ) 40 MG tablet; Take 1 tablet (40 mg total) by mouth at bedtime.  Dispense: 90 tablet; Refill: 3 - omeprazole  (PRILOSEC) 40 MG capsule; Take 1 capsule (40 mg total) by mouth daily.  Dispense: 180 capsule; Refill: 1 - CMP14+EGFR - CBC with Differential/Platelet  2. Type 2 diabetes mellitus with other specified complication, without long-term current use of insulin  (HCC) - glimepiride  (AMARYL ) 2 MG tablet; Take 1 tablet (2 mg total) by mouth daily as needed (blood sugar more than 200.).  Dispense: 90 tablet; Refill: 1 - empagliflozin  (JARDIANCE ) 10 MG TABS tablet; Take 1 tablet (10 mg total) by mouth daily before breakfast.  Dispense: 90 tablet; Refill: 1 - metFORMIN  (GLUCOPHAGE ) 1000 MG tablet; Take 1 tablet (1,000 mg total) by mouth 2 (two) times daily with a meal.  Dispense: 200 tablet; Refill: 2 - CMP14+EGFR - CBC with Differential/Platelet - Bayer DCA Hb A1c Waived  3. Annual physical exam (Primary) - CMP14+EGFR - CBC with Differential/Platelet  4. Solitary pulmonary nodule - CT Chest Wo Contrast; Future - CMP14+EGFR - CBC with Differential/Platelet  5. Hypertension associated with diabetes (HCC) - CMP14+EGFR - CBC with Differential/Platelet  6. Panlobular emphysema (HCC) - CMP14+EGFR - CBC with Differential/Platelet  7. Mixed hyperlipidemia - CMP14+EGFR - CBC with Differential/Platelet - Lipid panel  8. OSA on CPAP - CMP14+EGFR - CBC with Differential/Platelet  9. CAD in native artery - CMP14+EGFR - CBC with Differential/Platelet   Labs pending Discussed in length about follow up with Pulmonologist. Worrisome for lung cancer. Referral placed for Oncologists today. Repeat CT scan pending, pt needs biopsy.  Health Maintenance reviewed Diet and exercise encouraged  Follow up plan: 3 months    Bari Learn, [name]

## 2023-08-25 NOTE — Telephone Encounter (Signed)
 FYI Only or Action Required?: FYI only for provider.  Patient is followed in Pulmonology for COPD, pulmonary nodules, OSA, last seen on 04/28/2023 by [name] Jennet LABOR, MD.  Called Nurse Triage reporting Shortness of Breath.  Symptoms began several weeks ago.  Interventions attempted: Maintenance inhaler and Increased fluids/rest.  Symptoms are: unchanged.  Triage Disposition: See PCP When Office is Open (Within 3 Days)-scheduled for tomorrow with Erica Lites NP at 9:30 AM  Patient/caregiver understands and will follow disposition?: Yes Copied from CRM [id number]. Topic: Clinical - Red Word Triage >> Aug 25, 2023  2:24 PM Erica Braun wrote: Red Word that prompted transfer to Nurse Triage: Pt is calling to schedule an appt with NP Erica Braun due to having SOB the last two to three weeks with pulm nodules.   Pt stated she's been seeing her oxygen  drop below 90, 86-88 range, and it takes awhile resting for it to climb back to 95 or better. Pt stated she was on oxygen  temporarily after her ED visit on 03/27/2023.   No appt made; leadership stated to also not schedule any new appts with NP Erica Braun as her epic template is being worked on currently. Reason for Disposition  [1] MODERATE longstanding difficulty breathing (e.g., speaks in phrases, SOB even at rest, pulse 100-120) AND [2] SAME as normal  Answer Assessment - Initial Assessment Questions 1. RESPIRATORY STATUS: Describe your breathing? (e.g., wheezing, shortness of breath, unable to speak, severe coughing)      Shortness of breath the last two to three weeks with pulmonary nodules 2. ONSET: When did this breathing problem begin?      2-3 weeks ago 3. PATTERN Does the difficult breathing come and go, or has it been constant since it started?      Comes and goes-shortness of breath is more evident with activity.  4. SEVERITY: How bad is your breathing? (e.g., mild, moderate, severe)      Moderate to severe but depends on  activity.  5. RECURRENT SYMPTOM: Have you had difficulty breathing before? If Yes, ask: When was the last time? and What happened that time?      yes 6. CARDIAC HISTORY: Do you have any history of heart disease? (e.g., heart attack, angina, bypass surgery, angioplasty)      CAD 7. LUNG HISTORY: Do you have any history of lung disease?  (e.g., pulmonary embolus, asthma, emphysema)     COPD, sleep apnea, lung nodules 8. CAUSE: What do you think is causing the breathing problem?      Patient is concerned that her nodules are gotten larger.   9. OTHER SYMPTOMS: Do you have any other symptoms? (e.g., chest pain, cough, dizziness, fever, runny nose)     coughing 10. O2 SATURATION MONITOR:  Do you use an oxygen  saturation monitor (pulse oximeter) at home? If Yes, ask: What is your reading (oxygen  level) today? What is your usual oxygen  saturation reading? (e.g., 95%)       Patient reports seeing her oxygen  drop below 90% with activity. Patient reports she has to rest to get her oxygen  level back up to 95%. Patient reports oxygen  saturation 95% at PCP office today.  12. TRAVEL: Have you traveled out of the country in the last month? (e.g., travel history, exposures)       No  Patient was told to call pulmonary to set up follow up due to increased shortness of breath with pulmonary nodules. Patient reports shortness of breath is more pronounced with activity.  Patient saw PCP today who is concerned and wanted patient to have follow up. Appointment available with Erica Lites NP tomorrow at 9:30 AM. Verified with office who stated to go ahead and schedule patient for that time. Patient schedule for tomorrow with Erica Groce NP at 9:30 AM  Protocols used: Breathing Difficulty-A-AH

## 2023-08-26 ENCOUNTER — Encounter: Payer: Self-pay | Admitting: Emergency Medicine

## 2023-08-26 ENCOUNTER — Encounter: Payer: Self-pay | Admitting: Acute Care

## 2023-08-26 ENCOUNTER — Telehealth: Payer: Self-pay | Admitting: Acute Care

## 2023-08-26 ENCOUNTER — Ambulatory Visit (INDEPENDENT_AMBULATORY_CARE_PROVIDER_SITE_OTHER): Admitting: Acute Care

## 2023-08-26 VITALS — BP 131/74 | HR 85 | Temp 98.7°F | Ht 61.0 in | Wt 139.0 lb

## 2023-08-26 DIAGNOSIS — J441 Chronic obstructive pulmonary disease with (acute) exacerbation: Secondary | ICD-10-CM

## 2023-08-26 DIAGNOSIS — R9389 Abnormal findings on diagnostic imaging of other specified body structures: Secondary | ICD-10-CM | POA: Diagnosis not present

## 2023-08-26 DIAGNOSIS — R52 Pain, unspecified: Secondary | ICD-10-CM

## 2023-08-26 DIAGNOSIS — Z87891 Personal history of nicotine dependence: Secondary | ICD-10-CM | POA: Diagnosis not present

## 2023-08-26 DIAGNOSIS — R918 Other nonspecific abnormal finding of lung field: Secondary | ICD-10-CM

## 2023-08-26 DIAGNOSIS — R0602 Shortness of breath: Secondary | ICD-10-CM

## 2023-08-26 DIAGNOSIS — R911 Solitary pulmonary nodule: Secondary | ICD-10-CM | POA: Diagnosis not present

## 2023-08-26 DIAGNOSIS — J449 Chronic obstructive pulmonary disease, unspecified: Secondary | ICD-10-CM

## 2023-08-26 LAB — POC COVID19 BINAXNOW: SARS Coronavirus 2 Ag: NEGATIVE

## 2023-08-26 LAB — LIPID PANEL
Cholesterol, Total: 142 mg/dL (ref 100–199)
HDL: 51 mg/dL (ref >39)
LDL CALC COMMENT:: 2.8 ratio (ref 0.0–4.4)
LDL Chol Calc (NIH): 59 mg/dL (ref 0–99)
Triglycerides: 196 mg/dL — AB (ref 0–149)
VLDL Cholesterol Cal: 32 mg/dL (ref 5–40)

## 2023-08-26 LAB — CMP14+EGFR
ALT: 9 IU/L (ref 0–32)
AST: 12 IU/L (ref 0–40)
Albumin: 4 g/dL (ref 3.8–4.8)
Alkaline Phosphatase: 108 IU/L (ref 44–121)
BUN/Creatinine Ratio: 22 (ref 12–28)
BUN: 15 mg/dL (ref 8–27)
Bilirubin Total: 0.3 mg/dL (ref 0.0–1.2)
CO2: 24 mmol/L (ref 20–29)
Calcium: 9.5 mg/dL (ref 8.7–10.3)
Chloride: 98 mmol/L (ref 96–106)
Creatinine, Ser: 0.68 mg/dL (ref 0.57–1.00)
Globulin, Total: 2.4 g/dL (ref 1.5–4.5)
Glucose: 172 mg/dL — AB (ref 70–99)
Potassium: 4.4 mmol/L (ref 3.5–5.2)
Sodium: 137 mmol/L (ref 134–144)
Total Protein: 6.4 g/dL (ref 6.0–8.5)
eGFR: 91 mL/min/1.73 (ref >59)

## 2023-08-26 LAB — POC INFLUENZA A&B (BINAX/QUICKVUE)
Influenza A, POC: NEGATIVE
Influenza B, POC: NEGATIVE

## 2023-08-26 LAB — CBC WITH DIFFERENTIAL/PLATELET
Basophils Absolute: 0 x10E3/uL (ref 0.0–0.2)
Basos: 0 %
EOS (ABSOLUTE): 0.1 x10E3/uL (ref 0.0–0.4)
Eos: 2 %
Hematocrit: 33.3 % — ABNORMAL LOW (ref 34.0–46.6)
Hemoglobin: 10.1 g/dL — ABNORMAL LOW (ref 11.1–15.9)
Immature Grans (Abs): 0 x10E3/uL (ref 0.0–0.1)
Immature Granulocytes: 0 %
Lymphocytes Absolute: 0.6 x10E3/uL — ABNORMAL LOW (ref 0.7–3.1)
Lymphs: 13 %
MCH: 25.8 pg — ABNORMAL LOW (ref 26.6–33.0)
MCHC: 30.3 g/dL — ABNORMAL LOW (ref 31.5–35.7)
MCV: 85 fL (ref 79–97)
Monocytes Absolute: 0.6 x10E3/uL (ref 0.1–0.9)
Monocytes: 13 %
Neutrophils Absolute: 3.3 x10E3/uL (ref 1.4–7.0)
Neutrophils: 72 %
Platelets: 165 x10E3/uL (ref 150–450)
RBC: 3.92 x10E6/uL (ref 3.77–5.28)
RDW: 15.8 % — ABNORMAL HIGH (ref 11.7–15.4)
WBC: 4.6 x10E3/uL (ref 3.4–10.8)

## 2023-08-26 MED ORDER — PREDNISONE 10 MG PO TABS: ORAL_TABLET | ORAL | 0 refills | Status: DC

## 2023-08-26 MED ORDER — DOXYCYCLINE HYCLATE 100 MG PO TABS: 100.0000 mg | ORAL_TABLET | Freq: Two times a day (BID) | ORAL | 0 refills | Status: DC

## 2023-08-26 NOTE — Patient Instructions (Addendum)
 It is good to see you today. We will check for flu and covid.>> These were both negative Covid in particular has been going around.  I have sent in a prescription for Doxycycline   Take 1 tablet twice daily x 7 days. Take probiotic with antibiotic . I have also sent in a prednisone  taper. Prednisone  taper; 10 mg tablets: 4 tabs x 2 days, 3 tabs x 2 days, 2 tabs x 2 days 1 tab x 2 days then stop.  We have reviewed your most recent CT Chest.  We will plan on biopsy since the nodule has had some growth. We will do this in 2 weeks as long as you are recovered from your COPD flare. I have placed an order for a bronchoscopy with biopsies.  We have discussed the procedure in detail.  We have reviewed the risks and benefits of the procedure. These include bleeding, infection, puncture of the lung, and adverse reaction to anesthesia. You have agreed to proceed with biopsy to evaluate the right  upper lobe nodule. Your procedure will be done by Dr. Lamar Chris. You will receive a letter today with date time and information pertaining to the procedure. You will need someone to drive you to the procedure, stay with you during the procedure, and stay with you after the procedure. You will also need someone to stay with you for 24 hours after anesthesia to ensure you have cleared and are doing well. You will follow-up with me 1 week after the procedure to review the results and to ensure you are doing well. Call if you need us  prior to the procedure or if you have any questions at all. Please contact office for sooner follow up if symptoms do not improve or worsen or seek emergency care     I have replaced Christy's order with a Super D CT Chest, as this is what Dr.Byrum needs to map out your procedure. You will get a call to get this scheduled.  Call me in 1 week and let me know if you are better.  If you are not better, we will need to push the biopsy out a bit.  Call if you need us .  Please contact  office for sooner follow up if symptoms do not improve or worsen or seek emergency care

## 2023-08-26 NOTE — Telephone Encounter (Signed)
 [place] letter given by the nurse will send to [facility] to check the auth

## 2023-08-26 NOTE — Progress Notes (Signed)
 History of Present Illness Erica Braun is a 73 y.o. female former smoker with a 61 pack year smoking history, Quit 2010. She has a history of squamous cell carcinoma of the esophagus.  She is followed through the lung cancer screening program. She is here for consultation regarding a growing left lower lobe lung mass.She also has a history of asthma and OSA on CPAP, followed by Dr. [name].  She will be followed by Dr. Cloyce and [name], NP.for the lung nodules.  Pt. Is here today for a suspected COPD flare   08/26/2023 Discussed the use of AI scribe software for clinical note transcription with the patient, who gave verbal consent to proceed.  History of Present Illness Erica Braun is a 73 year old female with COPD who presents with shortness of breath and a dry cough.  She experiences increased shortness of breath and a dry, nonproductive cough, particularly noticeable at night and after supper. She has difficulty catching her breath after activities such as walking up stairs or gardening. Her heart rate increases and her oxygen  saturation drops during these episodes, with a recent reading showing a heart rate of 126 and oxygen  saturation of 86%.  She uses a CPAP machine at night but has episodes where she feels unable to breathe and needs to remove the mask. She monitors her oxygen  levels with an oximeter and reports improvement in oxygen  saturation as her heart rate decreases.  Her past medical history includes COPD, emphysema, and coronary artery disease with a completely blocked coronary artery that developed collateral circulation. She quit smoking in 2010. She has experienced respiratory failure in the past, requiring hospitalization and home oxygen  therapy, which she used for about three weeks until her oxygen  levels normalized.  She has a history of diabetes, managed with metformin  and Jardiance , taken daily. She experiences lightheadedness and  dizziness, particularly when short of breath, and occasional achiness at sundown.  She has a family history of breast cancer, with both her mother and sister having died from the disease. She has undergone breast biopsies in the past, which were negative.  No fever but she reports feeling achy as if coming down with something. She has previously had COVID-19, which she describes as a severe experience. She is not currently on blood thinners but takes a statin.  We have tested her for both Covid and Flu today, both were negative. We will treat for a COPD flare to see if this helps.  She had a CT Chest follow up as surveillance of a lung nodule 04/22/2023. This was read 05/09/2023. It showed a 9 x 6 mm right upper lobe pulmonary lung nodule. This had measured 9.5 mm 12/2022. She and I discussed this finding, and agree that once she is over her current flare, we will schedule her for navigational bronchoscopy with biopsy for tissue diagnosis what what may be a bronchogenic neoplasm. We reviewed the procedure in detail. We discussed the risks include bleeding, infection, pneumothorax and adverse reaction to anesthesia. She is in agreement with moving forward. I have scheduled her for a 09/08/2023 procedure/ biopsy  with Dr. Shelah. I have changed the order for a CT Chest without contrast to a Super D CT Chest to facilitate navigation to the right upper lobe nodule during the procedure. Pt. Verbalized understanding of the above. I will let her PCP know of the plan.     Test Results: CT Chest wo 04/22/2023, read 05/09/2023 Enlarging 9 x 6 mm  right upper lobe nodule laterally, concerning for bronchogenic neoplasm. Consider repeat PET-CT, tissue sampling, or at the very least three-month follow-up CT. 2. 6 mm medial basal left lower lobe nodule, unchanged since 01/16/2023, but on 12/25/2022 measured 9 mm, also was not seen in 2020. 3. 8 mm right middle lobe medial basal pleural based nodule, stable since  2020. 4. Emphysema and bronchitis. 5. Aortic and coronary artery atherosclerosis. 6. Old granulomatous disease. 7. Right posterior cortical nephrolithiasis. 8. Osteopenia, degenerative change and kyphosis thoracic spine.   Aortic Atherosclerosis (ICD10-I70.0) and Emphysema (ICD10-J43.9).    PET scan 01/16/2023 The left lower lobe nodule of concern is somewhat blurred by motion artifact but measures about 6 mm in diameter with maximum SUV 2.0. This low-grade activity is nonspecific for malignancy and could be from benign causes or low-grade adenocarcinoma. Chest CT surveillance for morphologic changes would be suggested. 2. Clustered right upper lobe nodularity with the cluster measuring about 0.8 by 0.6 cm, maximum SUV 2.5. This is borderline hypermetabolic and could be from atypical infection or malignancy. CT surveillance of this cluster of nodularity is likewise recommended. 3. Accentuated distal esophageal activity, maximum SUV 3.4, nonspecific for physiologic activity versus malignancy. 4. Accentuated anal activity, maximum SUV 5.5, physiologic activity versus malignancy. No obvious associated abnormality on the CT data. Correlate with history gastrointestinal screening, if not up-to-date and further assessment such as digital rectal exam and/or colon screening may be warranted. 5. Coronary, aortic arch, and branch vessel atherosclerotic vascular disease. 6. Emphysema. 7. Old granulomatous disease. 8. Grade 1 degenerative anterolisthesis at L3-4, L4-5, and L5-S1.   LDCT 12/26/2022 Lungs/Pleura: Emphysema with diffuse bronchial wall thickening. No pleural effusion or airspace consolidation. The posteromedial left lower lobe lung nodule has increased in size from previous exam. This now measures 9.5 mm, image 222/4. Previously 6.3 mm. Nodule within the subpleural right middle lobe is stable in the interval measuring 6.5 mm on today's study. Previously this measured 7.0  mm. No new lung nodules.   Upper Abdomen: No acute abnormality. Calcified granulomas noted within the spleen. Upper pole right kidney calcification measures 4 mm.   Musculoskeletal: No chest wall mass or suspicious bone lesions identified. Spondylosis noted within the thoracic spine.   IMPRESSION: 1. Lung-RADS 4B, suspicious. Additional imaging evaluation or consultation with Pulmonology or Thoracic Surgery recommended. Left lower lobe, 9.5 mm, image 222/4. 2. Coronary artery calcifications 3. Aortic Atherosclerosis (ICD10-I70.0) and Emphysema (ICD10-J43.9).      Latest Ref Rng & Units 08/25/2023   11:42 AM 04/16/2023    3:33 PM 03/28/2023    5:41 AM  CBC  WBC 3.4 - 10.8 x10E3/uL 4.6  3.6  3.1   Hemoglobin 11.1 - 15.9 g/dL 89.8  89.9  89.9   Hematocrit 34.0 - 46.6 % 33.3  32.5  32.7   Platelets 150 - 450 x10E3/uL 165  118  136        Latest Ref Rng & Units 08/25/2023   11:42 AM 04/16/2023    3:33 PM 03/29/2023    4:44 AM  BMP  Glucose 70 - 99 mg/dL 827  899  865   BUN 8 - 27 mg/dL 15  11  26    Creatinine 0.57 - 1.00 mg/dL 9.31  9.29  9.35   BUN/Creat Ratio 12 - 28 22  16     Sodium 134 - 144 mmol/L 137  142  135   Potassium 3.5 - 5.2 mmol/L 4.4  4.3  4.2   Chloride 96 - 106 mmol/L  98  104  102   CO2 20 - 29 mmol/L 24  24  27    Calcium  8.7 - 10.3 mg/dL 9.5  9.6  8.9     BNP    Component Value Date/Time   BNP 306.0 (H) 03/27/2023 1909    ProBNP No results found for: PROBNP  PFT    Component Value Date/Time   FEV1PRE 0.88 12/11/2017 1149   FEV1POST 0.98 12/11/2017 1149   FVCPRE 1.50 12/11/2017 1149   FVCPOST 1.64 12/11/2017 1149   TLC 5.01 12/11/2017 1149   DLCOUNC 11.95 12/11/2017 1149   PREFEV1FVCRT 58 12/11/2017 1149   PSTFEV1FVCRT 60 12/11/2017 1149    No results found.   Past medical hx Past Medical History:  Diagnosis Date   Complication of anesthesia    COPD (chronic obstructive pulmonary disease) (HCC)    Coronary artery disease    Diabetes  mellitus without complication (HCC)    Diverticulosis    Early cataracts, bilateral 11/21/2016   Esophageal cancer (HCC)    GERD (gastroesophageal reflux disease)    Glaucoma    patient denies   Hyperlipidemia    Hypertension    Internal hemorrhoids    Osteoporosis 12/16/2021   Pneumonia    PONV (postoperative nausea and vomiting)    Sleep apnea    uses Cpap   Spasm of the cricopharyngeus muscle    Squamous cell esophageal cancer (HCC) 05/10/2018     Social History   Tobacco Use   Smoking status: Former    Current packs/day: 0.00    Average packs/day: 1.5 packs/day for 41.0 years (61.5 ttl pk-yrs)    Types: Cigarettes    Start date: 67    Quit date: 2010    Years since quitting: 15.6   Smokeless tobacco: Never  Vaping Use   Vaping status: Never Used  Substance Use Topics   Alcohol use: Not Currently    Comment: wine occasionaly   Drug use: No    Erica Braun reports that she quit smoking about 15 years ago. Her smoking use included cigarettes. She started smoking about 56 years ago. She has a 61.5 pack-year smoking history. She has never used smokeless tobacco. She reports that she does not currently use alcohol. She reports that she does not use drugs.  Tobacco Cessation: Counseling given: Not Answered Former smoker , quit 2010 with a 61 pack year smoking history.  Past surgical hx, Family hx, Social hx all reviewed.  Current Outpatient Medications on File Prior to Visit  Medication Sig   acetaminophen  (TYLENOL ) 500 MG tablet Take 500 mg by mouth every 6 (six) hours as needed for mild pain (pain score 1-3).   ANORO ELLIPTA  62.5-25 MCG/ACT AEPB Inhale 1 puff by mouth once daily   atenolol  (TENORMIN ) 25 MG tablet Take 0.5 tablets (12.5 mg total) by mouth 2 (two) times daily.   calcium  carbonate (OSCAL) 1500 (600 Ca) MG TABS tablet Take 600 mg by mouth in the morning and at bedtime.   empagliflozin  (JARDIANCE ) 10 MG TABS tablet Take 1 tablet (10 mg total) by mouth  daily before breakfast.   famotidine  (PEPCID ) 40 MG tablet Take 1 tablet (40 mg total) by mouth at bedtime.   glimepiride  (AMARYL ) 2 MG tablet Take 1 tablet (2 mg total) by mouth daily as needed (blood sugar more than 200.).   ipratropium-albuterol  (DUONEB) 0.5-2.5 (3) MG/3ML SOLN Take 3 mLs by nebulization every 8 (eight) hours.   isosorbide  mononitrate (IMDUR ) 30 MG 24 hr tablet Take  0.5 tablets (15 mg total) by mouth daily.   metFORMIN  (GLUCOPHAGE ) 1000 MG tablet Take 1 tablet (1,000 mg total) by mouth 2 (two) times daily with a meal.   Multiple Vitamins-Minerals (ADULT ONE DAILY GUMMIES PO) Take 2 tablets by mouth in the morning. Women's Multi by [employer]   nitroGLYCERIN  (NITROSTAT ) 0.4 MG SL tablet Place 1 tablet (0.4 mg total) under the tongue every 5 (five) minutes as needed for chest pain.   omeprazole  (PRILOSEC) 40 MG capsule Take 1 capsule (40 mg total) by mouth daily.   rosuvastatin  (CRESTOR ) 10 MG tablet Take 1 tablet by mouth once daily   aspirin  EC 81 MG tablet Take 81 mg by mouth in the morning. (Patient not taking: Reported on 08/26/2023)   Cholecalciferol (VITAMIN D -3 PO) Take 5,000 Units by mouth in the morning. (Patient not taking: Reported on 08/26/2023)   No current facility-administered medications on file prior to visit.     No Known Allergies  Review Of Systems:  Constitutional:   No  weight loss, night sweats,  Fevers, chills,+ fatigue, or  lassitude.  HEENT:   No headaches,  Difficulty swallowing,  Tooth/dental problems, or  Sore throat,                No sneezing, itching, ear ache, +nasal congestion, post nasal drip,   CV:  No chest pain,  Orthopnea, PND, swelling in lower extremities, anasarca, dizziness, palpitations, syncope.   GI  + chronic heartburn, indigestion, abdominal pain, nausea, vomiting, diarrhea, change in bowel habits, loss of appetite, bloody stools.   Resp: + shortness of breath with exertion and  at rest.  No excess mucus, no productive  cough,  + non-productive cough,  No coughing up of blood.  No change in color of mucus.  + wheezing.  No chest wall deformity  Skin: no rash or lesions.  GU: no dysuria, change in color of urine, no urgency or frequency.  No flank pain, no hematuria   MS:  No joint pain or swelling.  No decreased range of motion.  No back pain.  Psych:  No change in mood or affect. No depression or anxiety.  No memory loss.   Vital Signs BP 131/74   Pulse 85   Temp 98.7 F (37.1 C) (Temporal)   Ht 5' 1 (1.549 m)   Wt 139 lb (63 kg)   SpO2 91%   BMI 26.26 kg/m    Physical Exam:  General- No distress,  A&Ox3, pleasant ENT: No sinus tenderness, TM clear, pale nasal mucosa, no oral exudate,no post nasal drip, no LAN Cardiac: S1, S2, regular rate and rhythm, no murmur Chest: No wheeze/ rales/ dullness; no accessory muscle use, no nasal flaring, no sternal retractions, slightly diminished per bases Abd.: Soft Non-tender, ND, BS +, Body mass index is 26.26 kg/m.  Ext: No clubbing cyanosis, edema, no obvious deformities Neuro:  normal strength, MAE x 4, A&O x 3, appropriate Skin: No rashes, warm and dry, no obvious skin lesions  Psych: normal mood and behavior   Assessment & Plan COPD with acute exacerbation Experiencing COPD flare with nonproductive cough, dyspnea, exertional desaturation, and tachycardia. Differential includes cardiac causes. Seasonal allergens possible trigger. COVID-19 and influenza tests negative. - Prescribed doxycycline  100 mg BID for 7 days. - Prescribed prednisone  taper. - Advised probiotics with antibiotics. - Scheduled follow-up in one week to assess improvement. - Consider video visit for follow-up if needed. - Advised follow-up with cardiology.  Pulmonary nodule, right upper lobe  8 mm nodule increased in size, requiring biopsy. Discussed biopsy risks and obtained informed consent. COPD may affect surgical options if cancerous. - Scheduled biopsy of right upper  lobe nodule for September 08, 2023. - Hold aspirin  before biopsy. - Ordered super DCT scan for navigational purposes during biopsy. - Discussed potential biopsy risks including bleeding, infection, and pneumothorax.  Type 2 diabetes mellitus Managed with metformin  and Jardiance .  AVS We will check for flu and covid.>> These were both negative Covid in particular has been going around.  I have sent in a prescription for Doxycycline   Take 1 tablet twice daily x 7 days. Take probiotic with antibiotic . I have also sent in a prednisone  taper. Prednisone  taper; 10 mg tablets: 4 tabs x 2 days, 3 tabs x 2 days, 2 tabs x 2 days 1 tab x 2 days then stop.  We have reviewed your most recent CT Chest.  We will plan on biopsy since the nodule has had some growth. We will do this in 2 weeks as long as you are recovered from your COPD flare. I have placed an order for a bronchoscopy with biopsies.  We have discussed the procedure in detail.  We have reviewed the risks and benefits of the procedure. These include bleeding, infection, puncture of the lung, and adverse reaction to anesthesia. You have agreed to proceed with biopsy to evaluate the right  upper lobe nodule. Your procedure will be done by Dr. Lamar Chris. You will receive a letter today with date time and information pertaining to the procedure. You will need someone to drive you to the procedure, stay with you during the procedure, and stay with you after the procedure. You will also need someone to stay with you for 24 hours after anesthesia to ensure you have cleared and are doing well. You will follow-up with me 1 week after the procedure to review the results and to ensure you are doing well. Call if you need us  prior to the procedure or if you have any questions at all. Please contact office for sooner follow up if symptoms do not improve or worsen or seek emergency care     I have replaced Christy's order with a Super D CT Chest, as  this is what Dr.Byrum needs to map out your procedure. You will get a call to get this scheduled.  Call me in 1 week and let me know if you are better.  If you are not better, we will need to push the biopsy out a bit.  Call if you need us .   Please contact office for sooner follow up if symptoms do not improve or worsen or seek emergency care    I spent 45 minutes dedicated to the care of this patient on the date of this encounter to include pre-visit review of records, face-to-face time with the patient discussing conditions above, post visit ordering of testing, clinical documentation with the electronic health record, making appropriate referrals as documented, and communicating necessary information to the patient's healthcare team.   Lauraine JULIANNA Lites, NP 08/26/2023  9:46 AM

## 2023-08-26 NOTE — Telephone Encounter (Signed)
 Please schedule the following:  Provider performing procedure: Byrum Diagnosis:  Lung nodule Which side if for nodule / mass?  Right  Procedure: Navigational bronchoscopy with biopsies  Has patient been spoken to by Provider and given informed consent? Chaney Domino NP on 08/26/2023 Anesthesia: General Do you need Fluro?  Yes Duration of procedure: 1.5 hours Date: 09/08/2023 Alternate Date: There are openings  Time: She would like second case of the day Location: [facility] Does patient have OSA?  DM? Yes, Jardiance  and Metformin  Or Latex allergy?  NO Medication Restriction/ Anticoagulate/Antiplatelet:  No Pre-op Labs Ordered:determined by Anesthesia Imaging request:    (If, SuperDimension CT Chest, please have STAT courier sent to ENDO)

## 2023-08-27 ENCOUNTER — Ambulatory Visit: Payer: Self-pay | Admitting: Family

## 2023-08-27 ENCOUNTER — Other Ambulatory Visit: Payer: Self-pay | Admitting: Family

## 2023-08-27 DIAGNOSIS — J441 Chronic obstructive pulmonary disease with (acute) exacerbation: Secondary | ICD-10-CM | POA: Diagnosis not present

## 2023-08-27 DIAGNOSIS — G4733 Obstructive sleep apnea (adult) (pediatric): Secondary | ICD-10-CM | POA: Diagnosis not present

## 2023-08-27 MED ORDER — EMPAGLIFLOZIN 25 MG PO TABS: 25.0000 mg | ORAL_TABLET | Freq: Every day | ORAL | 2 refills | Status: AC

## 2023-08-28 NOTE — Progress Notes (Signed)
 I agree with the plans as outlined above.   Lamar Chris, MD, PhD 08/28/2023, 6:31 PM [facility] [phone] or if no answer before 7:00PM call [phone] For any issues after 7:00PM please call eLink [phone]

## 2023-08-29 LAB — B12 AND FOLATE PANEL
Folate: 8.9 ng/mL (ref >3.0)
Vitamin B-12: 401 pg/mL (ref 232–1245)

## 2023-08-29 LAB — SPECIMEN STATUS REPORT

## 2023-08-29 LAB — IRON AND TIBC
Iron Saturation: 11 % — ABNORMAL LOW (ref 15–55)
Iron: 34 ug/dL (ref 27–139)
Total Iron Binding Capacity: 321 ug/dL (ref 250–450)
UIBC: 287 ug/dL (ref 118–369)

## 2023-08-31 ENCOUNTER — Other Ambulatory Visit: Payer: Self-pay | Admitting: Family

## 2023-08-31 DIAGNOSIS — E611 Iron deficiency: Secondary | ICD-10-CM | POA: Insufficient documentation

## 2023-08-31 MED ORDER — FERROUS SULFATE 325 (65 FE) MG PO TBEC: 325.0000 mg | DELAYED_RELEASE_TABLET | Freq: Every day | ORAL | 1 refills | Status: AC

## 2023-09-02 ENCOUNTER — Telehealth: Payer: Self-pay | Admitting: *Deleted

## 2023-09-02 ENCOUNTER — Other Ambulatory Visit: Payer: Self-pay | Admitting: Acute Care

## 2023-09-02 DIAGNOSIS — N760 Acute vaginitis: Secondary | ICD-10-CM

## 2023-09-02 MED ORDER — FLUCONAZOLE 150 MG PO TABS: 150.0000 mg | ORAL_TABLET | Freq: Every day | ORAL | 0 refills | Status: DC

## 2023-09-02 NOTE — Progress Notes (Signed)
 I have sent in a 1 time Diflucan  150 mg for acute vaginitis after antibiotic therapy.

## 2023-09-02 NOTE — Telephone Encounter (Signed)
 Copied from CRM #[id number]. Topic: Clinical - Medical Advice >> Sep 02, 2023 11:45 AM Isabell A wrote: Reason for CRM: Patient was told to call back by [name] to let her know if she has improved before her biopsy - patient states she has improved and completed her prednisone .   I called and spoke with the pt  She is calling to let Lauraine know that her breathing is much improved since the last visit  She is back to her normal baseline and able to walk daily for exercise without any issues breathing  She denies any current respiratory co's  She does state that despite taking probiotic daily with doxy, she has developed a vaginal yeast infection- has vaginal itching/irritation She is prone the this, and wonders if we can send medication   Lauraine, please advise, thanks!

## 2023-09-03 ENCOUNTER — Ambulatory Visit (HOSPITAL_COMMUNITY)
Admission: RE | Admit: 2023-09-03 | Discharge: 2023-09-03 | Disposition: A | Discharge status: Home / Self Care | Source: Ambulatory Visit | Attending: Acute Care | Admitting: Acute Care

## 2023-09-03 DIAGNOSIS — J432 Centrilobular emphysema: Secondary | ICD-10-CM | POA: Diagnosis not present

## 2023-09-03 DIAGNOSIS — R918 Other nonspecific abnormal finding of lung field: Secondary | ICD-10-CM | POA: Insufficient documentation

## 2023-09-03 MED ORDER — FLUCONAZOLE 150 MG PO TABS: 150.0000 mg | ORAL_TABLET | Freq: Every day | ORAL | 0 refills | Status: DC

## 2023-09-04 ENCOUNTER — Encounter (HOSPITAL_COMMUNITY): Payer: Self-pay | Admitting: Emergency Medicine

## 2023-09-04 ENCOUNTER — Other Ambulatory Visit: Payer: Self-pay

## 2023-09-04 NOTE — Progress Notes (Signed)
 Anesthesia Chart Review: Erica Braun  Case: [id number] Date/Time: 09/08/23 0915   Procedure: VIDEO BRONCHOSCOPY WITH ENDOBRONCHIAL NAVIGATION (Right)   Anesthesia type: General   Diagnosis: Lung nodule seen on imaging study [R91.1]   Pre-op diagnosis: growing lung nodule   [facility] / [facility]   Surgeons: Shelah Lamar RAMAN, MD       DISCUSSION: Patient is a 73 year old female scheduled for the above procedure.  History includes former smoker (quit 2010), post-operative N/V, COPD, HTN, HLD, DM2, CAD (CTA mRCA with collaterals, 30% mLAD, 60-70% D1 06/2017), GERD, OSA (uses CPAP), esophageal cancer (s/p endoscopic submucosal dissection 07/08/2018; post-treatment esophageal stricture requiring stent which was subsequently removed, s/p periodic esophageal dilations last 09/20/2021), hiatal hernia, right breast papilloma (s/p right breast lumpectomy 01/15/2022), skin cancer (BCC).  She has an enlarging RUL lung nodules. Above procedure advised on 08/26/2023. Pulmonology postponed until 09/08/2023 to allow treatment/recovery from suspected COPD flare (treated with doxycycline , prednisone  taper 08/26/2023).  Flu and COVID test were negative.  Last visit wit cardiologist Dr. [name] was on 04/27/2023. CAD managed on ASA, beta blocker, Imdur , statin. TTE in 03/2023 (during hospitalization for respiratory failure due to influenza A) showed LVEF 60-65%, no regional wall motion abnormalities, normal RV systolic function, mitral valve degenerative but no regurgitation or stenosis noted. Carotid US  01/2023 showed only mild carotid stenosis and no left subclavian high velocity recorded despite 30 mm difference in systolic blood pressure with bruit.  61-month follow-up planned.  A1c 8.0% on 08/25/2023. Advised by PAT to hold Jardiance  72 hour prior to procedure. Also on metformin  and Amaryl .  Anesthesia team to evaluate on the day of surgery.  She reported not currently taking aspirin .   VS:   Wt Readings from Last 3 Encounters:  08/26/23 63 kg  08/25/23 63.2 kg  04/28/23 63 kg   BP Readings from Last 3 Encounters:  08/26/23 131/74  08/25/23 139/64  04/28/23 (!) 145/73   Pulse Readings from Last 3 Encounters:  08/26/23 85  08/25/23 (!) 111  04/28/23 73     PROVIDERS: [name], [name] is PCP [name], MD is cardiologist [name], MD is pulmonologist (OSA, COPD). Shelah Lamar, MD (for lung nodules)   LABS:  Lab Results  Component Value Date   WBC 4.6 08/25/2023   HGB 10.1 (L) 08/25/2023   HCT 33.3 (L) 08/25/2023   PLT 165 08/25/2023   GLUCOSE 172 (H) 08/25/2023   CHOL 142 08/25/2023   TRIG 196 (H) 08/25/2023   HDL 51 08/25/2023   LDLCALC 59 08/25/2023   ALT 9 08/25/2023   AST 12 08/25/2023   NA 137 08/25/2023   K 4.4 08/25/2023   CL 98 08/25/2023   CREATININE 0.68 08/25/2023   BUN 15 08/25/2023   CO2 24 08/25/2023   TSH 0.416 03/28/2023   INR 1.0 03/24/2023   HGBA1C 8.0 (H) 08/25/2023     EGD 3/56/2025: IMPRESSION: Esophageal mucosal variant (distal esophagus).  Biopsied. [Squamocolumnar junctional mucosa with mild granular dilatation and mild features suggestive of reflux.  Negative for intestinal metaplasia.] 1-2 cm hiatal hernia. Normal stomach. Normal examined duodenum.   IMAGES: CT Super D Chest 09/03/2023: In process  CT Chest 04/22/2023: IMPRESSION: 1. Enlarging 9 x 6 mm right upper lobe nodule laterally, concerning for bronchogenic neoplasm. Consider repeat PET-CT, tissue sampling, or at the very least three-month follow-up CT. 2. 6 mm medial basal left lower lobe nodule, unchanged since 01/16/2023, but on 12/25/2022 measured 9 mm,  also was not seen in 2020. 3. 8 mm right middle lobe medial basal pleural based nodule, stable since 2020. 4. Emphysema and bronchitis. 5. Aortic and coronary artery atherosclerosis. 6. Old granulomatous disease. 7. Right posterior cortical nephrolithiasis. 8. Osteopenia, degenerative  change and kyphosis thoracic spine.  PET Scan 01/16/2023: IMPRESSION: 1. The left lower lobe nodule of concern is somewhat blurred by motion artifact but measures about 6 mm in diameter with maximum SUV 2.0. This low-grade activity is nonspecific for malignancy and could be from benign causes or low-grade adenocarcinoma. Chest CT surveillance for morphologic changes would be suggested. 2. Clustered right upper lobe nodularity with the cluster measuring about 0.8 by 0.6 cm, maximum SUV 2.5. This is borderline hypermetabolic and could be from atypical infection or malignancy. CT surveillance of this cluster of nodularity is likewise recommended. 3. Accentuated distal esophageal activity, maximum SUV 3.4, nonspecific for physiologic activity versus malignancy. 4. Accentuated anal activity, maximum SUV 5.5, physiologic activity versus malignancy. No obvious associated abnormality on the CT data. Correlate with history gastrointestinal screening, if not up-to-date and further assessment such as digital rectal exam and/or colon screening may be warranted. 5. Coronary, aortic arch, and branch vessel atherosclerotic vascular disease. 6. Emphysema. 7. Old granulomatous disease. 8. Grade 1 degenerative anterolisthesis at L3-4, L4-5, and L5-S1.    EKG: 03/27/2023: ST at 102 bpm   CV: TTE 03/28/2023: IMPRESSIONS   1. Left ventricular ejection fraction, by estimation, is 60 to 65%. The  left ventricle has normal function. The left ventricle has no regional  wall motion abnormalities. Left ventricular diastolic parameters are  indeterminate.   2. Right ventricular systolic function is normal. The right ventricular  size is normal. Tricuspid regurgitation signal is inadequate for assessing  PA pressure.   3. Right atrial size was mildly dilated.   4. The mitral valve is degenerative. No evidence of mitral valve  regurgitation. No evidence of mitral stenosis.   5. The aortic valve is  tricuspid. Aortic valve regurgitation is not  visualized. No aortic stenosis is present.   6. The inferior vena cava is normal in size with greater than 50%  respiratory variability, suggesting right atrial pressure of 3 mmHg.  - Comparison(s): No prior Echocardiogram.    US  Carotid 01/26/2023: Summary:  - Right Carotid: Velocities in the right ICA are consistent with a 1-39%  stenosis.                Non-hemodynamically significant plaque <50% noted in the  CCA. The                 ECA appears <50% stenosed.  - Left Carotid: Velocities in the left ICA are consistent with a 1-39%  stenosis.               Non-hemodynamically significant plaque <50% noted in the  CCA. The                ECA appears <50% stenosed.  - Vertebrals:  Right vertebral artery demonstrates antegrade flow. Left  vertebral              artery demonstrates retrograde flow.  - Subclavians: Normal flow hemodynamics were seen in bilateral subclavian               arteries.    Cath 06/11/2017: Chronic total occlusion of the mid RCA collateralized from LAD and circumflex. Widely patent left main.            30% irregularity  in the proximal to mid LAD.  Large diagonal #1 with ostial 60% narrowing and mid vessel diffuse 70% stenosis.  The LAD wraps around the left ventricular apex. Circumflex is widely patent with luminal irregularities.            Left ventriculography demonstrates inferobasal moderate hypokinesis.  EF 55%.  Normal LVEDP.   RECOMMENDATIONS:             Uptitrate anti-ischemic therapy.  Have prescribed 30 mg of isosorbide  mononitrate which in addition to metoprolol  can be uptitrated to control symptoms. If symptom control on medical therapy is unacceptable, she should be referred to our CTO team.    Past Medical History:  Diagnosis Date   Complication of anesthesia    COPD (chronic obstructive pulmonary disease) (HCC)    Coronary artery disease    Diabetes mellitus without complication  (HCC)    Diverticulosis    Early cataracts, bilateral 11/21/2016   Esophageal cancer (HCC)    GERD (gastroesophageal reflux disease)    Glaucoma    patient denies   Hyperlipidemia    Hypertension    Internal hemorrhoids    Osteoporosis 12/16/2021   Pneumonia    PONV (postoperative nausea and vomiting)    Sleep apnea    uses Cpap   Spasm of the cricopharyngeus muscle    Squamous cell esophageal cancer (HCC) 05/10/2018    Past Surgical History:  Procedure Laterality Date   ABDOMINAL HYSTERECTOMY     cervical dysplasia   BIOPSY  05/03/2018   Procedure: BIOPSY;  Surgeon: Wilhelmenia Aloha Raddle., MD;  Location: [facility] ENDOSCOPY;  Service: Gastroenterology;;   BREAST BIOPSY Left    BREAST BIOPSY Right 11/27/2021   MM RT BREAST BX W LOC DEV 1ST LESION IMAGE BX SPEC STEREO GUIDE 11/27/2021 [facility]   BREAST BIOPSY  01/14/2022   MM RT RADIOACTIVE SEED LOC MAMMO GUIDE 01/14/2022 [facility]   BREAST LUMPECTOMY Left    BREAST LUMPECTOMY WITH RADIOACTIVE SEED LOCALIZATION Right 01/15/2022   Procedure: RIGHT BREAST LUMPECTOMY WITH RADIOACTIVE SEED LOCALIZATION;  Surgeon: [name] Ned, MD;  Location: [facility];  Service: General;  Laterality: Right;   ESOPHAGOGASTRODUODENOSCOPY (EGD) WITH PROPOFOL  N/A 05/03/2018   Procedure: ESOPHAGOGASTRODUODENOSCOPY (EGD) WITH PROPOFOL ;  Surgeon: Wilhelmenia Aloha Raddle., MD;  Location: [facility] ENDOSCOPY;  Service: Gastroenterology;  Laterality: N/A;   ESOPHAGOGASTRODUODENOSCOPY (EGD) WITH PROPOFOL  N/A 11/18/2018   Procedure: ESOPHAGOGASTRODUODENOSCOPY (EGD) WITH PROPOFOL ;  Surgeon: Abran Norleen SAILOR, MD;  Location: [facility] ENDOSCOPY;  Service: Endoscopy;  Laterality: N/A;   EUS  05/03/2018   Procedure: UPPER ENDOSCOPIC ULTRASOUND (EUS) RADIAL;  Surgeon: Wilhelmenia Aloha Raddle., MD;  Location: [facility] ENDOSCOPY;  Service: Gastroenterology;;   FOREIGN BODY REMOVAL  11/18/2018   Procedure: FOREIGN BODY REMOVAL;  Surgeon: Abran Norleen SAILOR, MD;  Location: [facility] ENDOSCOPY;   Service: Endoscopy;;   LEFT HEART CATH AND CORONARY ANGIOGRAPHY N/A 06/11/2017   Procedure: LEFT HEART CATH AND CORONARY ANGIOGRAPHY;  Surgeon: [name] Victory ORN, MD;  Location: [facility] INVASIVE CV LAB;  Service: Cardiovascular;  Laterality: N/A;    MEDICATIONS: No current facility-administered medications for this encounter.    ferrous sulfate  325 (65 FE) MG EC tablet   acetaminophen  (TYLENOL ) 500 MG tablet   ANORO ELLIPTA  62.5-25 MCG/ACT AEPB   aspirin  EC 81 MG tablet   atenolol  (TENORMIN ) 25 MG tablet   calcium  carbonate (OSCAL) 1500 (600 Ca) MG TABS tablet   Cholecalciferol (VITAMIN D -3 PO)   doxycycline  (VIBRA -TABS) 100 MG tablet   empagliflozin  (JARDIANCE ) 25 MG  TABS tablet   famotidine  (PEPCID ) 40 MG tablet   fluconazole  (DIFLUCAN ) 150 MG tablet   fluconazole  (DIFLUCAN ) 150 MG tablet   glimepiride  (AMARYL ) 2 MG tablet   ipratropium-albuterol  (DUONEB) 0.5-2.5 (3) MG/3ML SOLN   isosorbide  mononitrate (IMDUR ) 30 MG 24 hr tablet   metFORMIN  (GLUCOPHAGE ) 1000 MG tablet   Multiple Vitamins-Minerals (ADULT ONE DAILY GUMMIES PO)   nitroGLYCERIN  (NITROSTAT ) 0.4 MG SL tablet   omeprazole  (PRILOSEC) 40 MG capsule   predniSONE  (DELTASONE ) 10 MG tablet   rosuvastatin  (CRESTOR ) 10 MG tablet    Isaiah Ruder, PA-C Surgical Short Stay/Anesthesiology [facility] Phone [phone] [facility] Phone [phone] 09/04/2023 12:53 PM

## 2023-09-04 NOTE — Anesthesia Preprocedure Evaluation (Addendum)
 Anesthesia Evaluation  Patient identified by MRN, date of birth, ID band Patient awake    Reviewed: Allergy & Precautions, NPO status , Patient's Chart, lab work & pertinent test results  History of Anesthesia Complications (+) PONV and history of anesthetic complications  Airway Mallampati: II  TM Distance: >3 FB Neck ROM: Full    Dental  (+) Edentulous Upper, Edentulous Lower   Pulmonary sleep apnea and Continuous Positive Airway Pressure Ventilation , COPD,  COPD inhaler, Recent URI , former smoker Former smoker, 62 pack year history   postponed until 09/08/2023 to allow treatment/recovery from suspected COPD flare (treated with doxycycline , prednisone  taper 08/26/2023).  Flu and COVID test were negative. Was feeling better until yesterday, SOB again but feels okay currently   Pulmonary exam normal breath sounds clear to auscultation       Cardiovascular hypertension (125/64 preop), + CAD  Normal cardiovascular exam Rhythm:Regular Rate:Normal  CAD (CTA mRCA with collaterals, 30% mLAD, 60-70% D1 06/2017)   TTE in 03/2023 (during hospitalization for respiratory failure due to influenza A) showed LVEF 60-65%, no regional wall motion abnormalities, normal RV systolic function, mitral valve degenerative but no regurgitation or stenosis noted. Carotid US  01/2023 showed only mild carotid stenosis and no left subclavian high velocity recorded despite 30 mm difference in systolic blood pressure with bruit.    Neuro/Psych negative neurological ROS  negative psych ROS   GI/Hepatic Neg liver ROS,GERD  Controlled and Medicated,,Hx esophageal ca- (s/p endoscopic submucosal dissection 07/08/2018; post-treatment esophageal stricture requiring stent which was subsequently removed, s/p periodic esophageal dilations last 09/20/2021), hiatal hernia   Endo/Other  diabetes, Well Controlled, Type 2, Oral Hypoglycemic Agents  A1c 8  Renal/GU negative Renal  ROS  negative genitourinary   Musculoskeletal negative musculoskeletal ROS (+)    Abdominal   Peds  Hematology negative hematology ROS (+) Hb 10.1, plt 165   Anesthesia Other Findings   Reproductive/Obstetrics negative OB ROS                              Anesthesia Physical Anesthesia Plan  ASA: 3  Anesthesia Plan: General   Post-op Pain Management: Tylenol  PO (pre-op)*   Induction: Intravenous  PONV Risk Score and Plan: 4 or greater and Ondansetron , Dexamethasone , Midazolam  and Treatment may vary due to age or medical condition  Airway Management Planned: Oral ETT  Additional Equipment: None  Intra-op Plan:   Post-operative Plan: Extubation in OR  Informed Consent: I have reviewed the patients History and Physical, chart, labs and discussed the procedure including the risks, benefits and alternatives for the proposed anesthesia with the patient or authorized representative who has indicated his/her understanding and acceptance.     Dental advisory given  Plan Discussed with: CRNA  Anesthesia Plan Comments:          Anesthesia Quick Evaluation

## 2023-09-04 NOTE — Progress Notes (Signed)
 PCP - Bari DELENA Learn, [name] Cardiologist - Maude Emmer, MD  Chest x-ray - DOS EKG - 03/27/23 Stress Test - 06/08/17 ECHO - 03/28/23 Cardiac Cath - 06/11/17  CPAP - Wears every night  Fasting Blood Sugar - 115-250 (if on steroids) Checks Blood Sugar 1/day  Aspirin  Instructions: Does not take anymore  Anesthesia review: Y  Patient verbally denies any shortness of breath, fever, cough and chest pain during phone call   -------------  SDW INSTRUCTIONS given:  Your procedure is scheduled on Tuesday, Sept 2nd.  Report to [facility] Main Entrance A at 0645 A.M., and check in at the Admitting office.  Call this number if you have problems the morning of surgery:  [phone]   Remember:  Do not eat or drink after midnight the night before your surgery    Take these medicines the morning of surgery with A SIP OF WATER  ANORO ELLIPTA   atenolol  (TENORMIN )  ipratropium-albuterol  (DUONEB)  isosorbide  mononitrate (IMDUR )  omeprazole  (PRILOSEC)  acetaminophen  (TYLENOL )-if needed  **empagliflozin  (JARDIANCE )** Stop taking 72 hr prior to your procedure **glimepiride  (AMARYL )** Do not take an evening dose the night before your surgery. Can take AM or Lunch dose if needed **metFORMIN  (GLUCOPHAGE )**Do not take the morning of your surgery.   ** PLEASE check your blood sugar the morning of your surgery when you wake up and every 2 hours until you get to the Short Stay unit.  If your blood sugar is less than 70 mg/dL, you will need to treat for low blood sugar: Do not take insulin . Treat a low blood sugar (less than 70 mg/dL) with  cup of clear juice (cranberry or apple), 4 glucose tablets, OR glucose gel. Recheck blood sugar in 15 minutes after treatment (to make sure it is greater than 70 mg/dL). If your blood sugar is not greater than 70 mg/dL on recheck, call [phone] for further instructions.    As of today, STOP taking any Aspirin  (unless otherwise instructed by your  surgeon) Aleve, Naproxen, Ibuprofen, Motrin, Advil, Goody's, BC's, all herbal medications, fish oil, and all vitamins.                      Do not wear jewelry, make up, or nail polish            Do not wear lotions, powders, perfumes/colognes, or deodorant.            Do not shave 48 hours prior to surgery.  Men may shave face and neck.            Do not bring valuables to the hospital.            [facility] is not responsible for any belongings or valuables.  Do NOT Smoke (Tobacco/Vaping) 24 hours prior to your procedure If you use a CPAP at night, you may bring all equipment for your overnight stay.   Contacts, glasses, dentures or bridgework may not be worn into surgery.      For patients admitted to the hospital, discharge time will be determined by your treatment team.   Patients discharged the day of surgery will not be allowed to drive home, and someone needs to stay with them for 24 hours.    Special instructions:   [place]- Preparing For Surgery  Before surgery, you can play an important role. Because skin is not sterile, your skin needs to be as free of germs as possible. You can reduce the number of  germs on your skin by washing with CHG (chlorahexidine gluconate) Soap before surgery.  CHG is an antiseptic cleaner which kills germs and bonds with the skin to continue killing germs even after washing.    Oral Hygiene is also important to reduce your risk of infection.  Remember - BRUSH YOUR TEETH THE MORNING OF SURGERY WITH YOUR REGULAR TOOTHPASTE  Please do not use if you have an allergy to CHG or antibacterial soaps. If your skin becomes reddened/irritated stop using the CHG.  Do not shave (including legs and underarms) for at least 48 hours prior to first CHG shower. It is OK to shave your face.  Please follow these instructions carefully.   Shower the NIGHT BEFORE SURGERY and the MORNING OF SURGERY with DIAL Soap.   Pat yourself dry with a CLEAN TOWEL.  Wear  CLEAN PAJAMAS to bed the night before surgery  Place CLEAN SHEETS on your bed the night of your first shower and DO NOT SLEEP WITH PETS.   Day of Surgery: Please shower morning of surgery  Wear Clean/Comfortable clothing the morning of surgery Do not apply any deodorants/lotions.   Remember to brush your teeth WITH YOUR REGULAR TOOTHPASTE.   Questions were answered. Patient verbalized understanding of instructions.

## 2023-09-08 ENCOUNTER — Ambulatory Visit (HOSPITAL_COMMUNITY)

## 2023-09-08 ENCOUNTER — Ambulatory Visit (HOSPITAL_COMMUNITY)
Admission: RE | Admit: 2023-09-08 | Discharge: 2023-09-08 | Disposition: A | Discharge status: Home / Self Care | Attending: Emergency Medicine | Admitting: Emergency Medicine

## 2023-09-08 ENCOUNTER — Encounter (HOSPITAL_COMMUNITY)
Admission: RE | Disposition: A | Discharge status: Home / Self Care | Payer: Self-pay | Source: Home / Self Care | Attending: Emergency Medicine

## 2023-09-08 ENCOUNTER — Ambulatory Visit (HOSPITAL_COMMUNITY): Payer: Self-pay | Admitting: Vascular Surgery

## 2023-09-08 ENCOUNTER — Other Ambulatory Visit: Payer: Self-pay

## 2023-09-08 ENCOUNTER — Encounter (HOSPITAL_COMMUNITY): Payer: Self-pay | Admitting: Emergency Medicine

## 2023-09-08 ENCOUNTER — Ambulatory Visit: Payer: Self-pay | Admitting: Family

## 2023-09-08 DIAGNOSIS — E1169 Type 2 diabetes mellitus with other specified complication: Secondary | ICD-10-CM | POA: Diagnosis not present

## 2023-09-08 DIAGNOSIS — E782 Mixed hyperlipidemia: Secondary | ICD-10-CM | POA: Insufficient documentation

## 2023-09-08 DIAGNOSIS — I10 Essential (primary) hypertension: Secondary | ICD-10-CM

## 2023-09-08 DIAGNOSIS — J431 Panlobular emphysema: Secondary | ICD-10-CM | POA: Diagnosis not present

## 2023-09-08 DIAGNOSIS — Z48813 Encounter for surgical aftercare following surgery on the respiratory system: Secondary | ICD-10-CM | POA: Diagnosis not present

## 2023-09-08 DIAGNOSIS — Z7984 Long term (current) use of oral hypoglycemic drugs: Secondary | ICD-10-CM | POA: Diagnosis not present

## 2023-09-08 DIAGNOSIS — R911 Solitary pulmonary nodule: Secondary | ICD-10-CM

## 2023-09-08 DIAGNOSIS — Z79899 Other long term (current) drug therapy: Secondary | ICD-10-CM | POA: Diagnosis not present

## 2023-09-08 DIAGNOSIS — J398 Other specified diseases of upper respiratory tract: Secondary | ICD-10-CM | POA: Diagnosis not present

## 2023-09-08 DIAGNOSIS — Z8501 Personal history of malignant neoplasm of esophagus: Secondary | ICD-10-CM | POA: Diagnosis not present

## 2023-09-08 DIAGNOSIS — Z87891 Personal history of nicotine dependence: Secondary | ICD-10-CM | POA: Insufficient documentation

## 2023-09-08 DIAGNOSIS — E1159 Type 2 diabetes mellitus with other circulatory complications: Secondary | ICD-10-CM | POA: Diagnosis not present

## 2023-09-08 DIAGNOSIS — G4733 Obstructive sleep apnea (adult) (pediatric): Secondary | ICD-10-CM | POA: Diagnosis not present

## 2023-09-08 DIAGNOSIS — R918 Other nonspecific abnormal finding of lung field: Secondary | ICD-10-CM | POA: Diagnosis not present

## 2023-09-08 DIAGNOSIS — K2100 Gastro-esophageal reflux disease with esophagitis, without bleeding: Secondary | ICD-10-CM | POA: Diagnosis not present

## 2023-09-08 DIAGNOSIS — I2510 Atherosclerotic heart disease of native coronary artery without angina pectoris: Secondary | ICD-10-CM | POA: Diagnosis not present

## 2023-09-08 DIAGNOSIS — C3411 Malignant neoplasm of upper lobe, right bronchus or lung: Secondary | ICD-10-CM | POA: Diagnosis not present

## 2023-09-08 HISTORY — PX: VIDEO BRONCHOSCOPY WITH ENDOBRONCHIAL NAVIGATION: SHX6175

## 2023-09-08 LAB — GLUCOSE, CAPILLARY: Glucose-Capillary: 136 mg/dL — ABNORMAL HIGH (ref 70–99)

## 2023-09-08 SURGERY — VIDEO BRONCHOSCOPY WITH ENDOBRONCHIAL NAVIGATION
Anesthesia: General | Laterality: Right

## 2023-09-08 MED ORDER — ONDANSETRON HCL 4 MG/2ML IJ SOLN
INTRAMUSCULAR | Status: DC | PRN
  Administered 2023-09-08: 4 mg via INTRAVENOUS

## 2023-09-08 MED ORDER — PHENYLEPHRINE 80 MCG/ML (10ML) SYRINGE FOR IV PUSH (FOR BLOOD PRESSURE SUPPORT)
PREFILLED_SYRINGE | INTRAVENOUS | Status: DC | PRN
  Administered 2023-09-08 (×2): 160 ug via INTRAVENOUS
  Administered 2023-09-08 (×4): 80 ug via INTRAVENOUS

## 2023-09-08 MED ORDER — ACETAMINOPHEN 500 MG PO TABS
1000.0000 mg | ORAL_TABLET | Freq: Once | ORAL | Status: AC
  Administered 2023-09-08: 1000 mg via ORAL
  Filled 2023-09-08: qty 2

## 2023-09-08 MED ORDER — FENTANYL CITRATE (PF) 100 MCG/2ML IJ SOLN: 25.0000 ug | INTRAMUSCULAR | Status: DC | PRN

## 2023-09-08 MED ORDER — ROCURONIUM BROMIDE 10 MG/ML (PF) SYRINGE
PREFILLED_SYRINGE | INTRAVENOUS | Status: DC | PRN
  Administered 2023-09-08: 50 mg via INTRAVENOUS

## 2023-09-08 MED ORDER — CHLORHEXIDINE GLUCONATE 0.12 % MT SOLN
OROMUCOSAL | Status: AC
  Administered 2023-09-08: 15 mL via OROMUCOSAL
  Filled 2023-09-08: qty 15

## 2023-09-08 MED ORDER — LACTATED RINGERS IV SOLN: INTRAVENOUS | Status: DC

## 2023-09-08 MED ORDER — PROPOFOL 10 MG/ML IV BOLUS
INTRAVENOUS | Status: DC | PRN
  Administered 2023-09-08: 150 mg via INTRAVENOUS

## 2023-09-08 MED ORDER — FENTANYL CITRATE (PF) 250 MCG/5ML IJ SOLN
INTRAMUSCULAR | Status: DC | PRN
  Administered 2023-09-08: 50 ug via INTRAVENOUS

## 2023-09-08 MED ORDER — FENTANYL CITRATE (PF) 100 MCG/2ML IJ SOLN
INTRAMUSCULAR | Status: AC
Start: 2023-09-08 — End: 2023-09-08
  Filled 2023-09-08: qty 2

## 2023-09-08 MED ORDER — ONDANSETRON HCL 4 MG/2ML IJ SOLN: 4.0000 mg | Freq: Once | INTRAMUSCULAR | Status: DC | PRN

## 2023-09-08 MED ORDER — CHLORHEXIDINE GLUCONATE 0.12 % MT SOLN: 15.0000 mL | Freq: Once | OROMUCOSAL | Status: AC

## 2023-09-08 MED ORDER — DEXAMETHASONE SODIUM PHOSPHATE 10 MG/ML IJ SOLN
INTRAMUSCULAR | Status: DC | PRN
Start: 2023-09-08 — End: 2023-09-08
  Administered 2023-09-08: 8 mg via INTRAVENOUS

## 2023-09-08 MED ORDER — LIDOCAINE 2% (20 MG/ML) 5 ML SYRINGE
INTRAMUSCULAR | Status: DC | PRN
  Administered 2023-09-08: 60 mg via INTRAVENOUS

## 2023-09-08 MED ORDER — SUGAMMADEX SODIUM 200 MG/2ML IV SOLN
INTRAVENOUS | Status: DC | PRN
Start: 2023-09-08 — End: 2023-09-08
  Administered 2023-09-08: 130 mg via INTRAVENOUS

## 2023-09-08 MED ORDER — PROPOFOL 500 MG/50ML IV EMUL
INTRAVENOUS | Status: DC | PRN
  Administered 2023-09-08: 125 ug/kg/min via INTRAVENOUS

## 2023-09-08 SURGICAL SUPPLY — 37 items
ADAPTER BRONCHOSCOPE OLYMPUS (ADAPTER) ×1 IMPLANT
ADAPTER VALVE BIOPSY EBUS (MISCELLANEOUS) IMPLANT
BAG COUNTER SPONGE SURGICOUNT (BAG) ×1 IMPLANT
BRUSH CYTOL CELLEBRITY 1.5X140 (MISCELLANEOUS) ×1 IMPLANT
BRUSH SUPERTRAX BIOPSY (INSTRUMENTS) IMPLANT
BRUSH SUPERTRAX NDL-TIP CYTO (INSTRUMENTS) ×1 IMPLANT
CANISTER SUCTION 3000ML PPV (SUCTIONS) ×1 IMPLANT
CNTNR URN SCR LID CUP LEK RST (MISCELLANEOUS) ×1 IMPLANT
COVER BACK TABLE 60X90IN (DRAPES) ×1 IMPLANT
FILTER STRAW FLUID ASPIR (MISCELLANEOUS) IMPLANT
FORCEPS BIOP 1.5 SINGLE USE (MISCELLANEOUS) ×1 IMPLANT
FORCEPS BIOP SUPERTRX PREMAR (INSTRUMENTS) ×1 IMPLANT
GAUZE SPONGE 4X4 12PLY STRL (GAUZE/BANDAGES/DRESSINGS) ×1 IMPLANT
GLOVE BIO SURGEON STRL SZ7.5 (GLOVE) ×2 IMPLANT
GOWN STRL REUS W/ TWL LRG LVL3 (GOWN DISPOSABLE) ×2 IMPLANT
KIT CLEAN ENDO COMPLIANCE (KITS) ×1 IMPLANT
KIT LOCATABLE GUIDE (CANNULA) IMPLANT
KIT MARKER FIDUCIAL DELIVERY (KITS) IMPLANT
KIT TURNOVER KIT B (KITS) ×1 IMPLANT
MARKER SKIN DUAL TIP RULER LAB (MISCELLANEOUS) ×1 IMPLANT
NDL SUPERTRX PREMARK BIOPSY (NEEDLE) ×1 IMPLANT
NEEDLE SUPERTRX PREMARK BIOPSY (NEEDLE) ×1 IMPLANT
NS IRRIG 1000ML POUR BTL (IV SOLUTION) ×1 IMPLANT
OIL SILICONE PENTAX (PARTS (SERVICE/REPAIRS)) ×1 IMPLANT
PAD ARMBOARD POSITIONER FOAM (MISCELLANEOUS) ×2 IMPLANT
PATCHES PATIENT (LABEL) ×3 IMPLANT
SYR 20ML ECCENTRIC (SYRINGE) ×1 IMPLANT
SYR 20ML LL LF (SYRINGE) ×1 IMPLANT
SYR 50ML SLIP (SYRINGE) ×1 IMPLANT
SuperLock Fiducial Marker IMPLANT
TOWEL GREEN STERILE FF (TOWEL DISPOSABLE) ×1 IMPLANT
TRAP SPECIMEN MUCUS 40CC (MISCELLANEOUS) IMPLANT
TUBE CONNECTING 20X1/4 (TUBING) ×1 IMPLANT
UNDERPAD 30X36 HEAVY ABSORB (UNDERPADS AND DIAPERS) ×1 IMPLANT
VALVE BIOPSY SINGLE USE (MISCELLANEOUS) ×1 IMPLANT
VALVE SUCTION BRONCHIO DISP (MISCELLANEOUS) ×1 IMPLANT
WATER STERILE IRR 1000ML POUR (IV SOLUTION) ×1 IMPLANT

## 2023-09-08 NOTE — Op Note (Signed)
 Procedure Note  Patient: Erica Braun  [employer] Cios mobile C-arm was utilized to identify and biopsy right upper lobe nodule.  Needle-in-lesion was confirmed using real-time Cios imaging, and images were uploaded to PACS.       Lamar Chris, MD, PhD 09/08/2023, 11:54 AM [facility] [phone] or if no answer before 7:00PM call [phone] For any issues after 7:00PM please call eLink [phone]

## 2023-09-08 NOTE — Anesthesia Procedure Notes (Signed)
 Procedure Name: Intubation Date/Time: 09/08/2023 9:38 AM  Performed by: Vera Rochele PARAS, CRNAPre-anesthesia Checklist: Patient identified, Emergency Drugs available, Suction available and Patient being monitored Patient Re-evaluated:Patient Re-evaluated prior to induction Oxygen  Delivery Method: Circle System Utilized Preoxygenation: Pre-oxygenation with 100% oxygen  Induction Type: IV induction Ventilation: Mask ventilation without difficulty Laryngoscope Size: Miller and 3 Grade View: Grade I Tube type: Oral Tube size: 8.5 mm Number of attempts: 1 Airway Equipment and Method: Stylet and Oral airway Placement Confirmation: ETT inserted through vocal cords under direct vision, positive ETCO2 and breath sounds checked- equal and bilateral Secured at: 19 cm Tube secured with: Tape Dental Injury: Teeth and Oropharynx as per pre-operative assessment

## 2023-09-08 NOTE — Interval H&P Note (Signed)
 History and Physical Interval Note:  09/08/2023 7:43 AM  Erica Braun  has presented today for surgery, with the diagnosis of growing lung nodule.  The various methods of treatment have been discussed with the patient and family. After consideration of risks, benefits and other options for treatment, the patient has consented to  Procedure(s): VIDEO BRONCHOSCOPY WITH ENDOBRONCHIAL NAVIGATION (Right) as a surgical intervention.  The patient's history has been reviewed, patient examined, no change in status, stable for surgery.  I have reviewed the patient's chart and labs.  Questions were answered to the patient's satisfaction.     Lamar GORMAN Chris

## 2023-09-08 NOTE — Transfer of Care (Signed)
 Immediate Anesthesia Transfer of Care Note  Patient: Erica Braun  Procedure(s) Performed: VIDEO BRONCHOSCOPY WITH ENDOBRONCHIAL NAVIGATION (Right)  Patient Location: PACU  Anesthesia Type:General  Level of Consciousness: awake, alert , and oriented  Airway & Oxygen  Therapy: Patient Spontanous Breathing and Patient connected to face mask oxygen   Post-op Assessment: Report given to RN and Post -op Vital signs reviewed and stable  Post vital signs: Reviewed and stable  Last Vitals:  Vitals Value Taken Time  BP 105/47 09/08/23 10:34  Temp 37.1 C 09/08/23 10:34  Pulse 94 09/08/23 10:37  Resp 14 09/08/23 10:37  SpO2 93 % 09/08/23 10:37  Vitals shown include unfiled device data.  Last Pain:  Vitals:   09/08/23 0741  TempSrc:   PainSc: 0-No pain         Complications: No notable events documented.

## 2023-09-08 NOTE — Discharge Instructions (Addendum)

## 2023-09-08 NOTE — Anesthesia Postprocedure Evaluation (Signed)
 Anesthesia Post Note  Patient: Erica Braun  Procedure(s) Performed: VIDEO BRONCHOSCOPY WITH ENDOBRONCHIAL NAVIGATION (Right)     Patient location during evaluation: PACU Anesthesia Type: General Level of consciousness: awake and alert Pain management: pain level controlled Vital Signs Assessment: post-procedure vital signs reviewed and stable Respiratory status: spontaneous breathing, nonlabored ventilation and respiratory function stable Cardiovascular status: blood pressure returned to baseline and stable Postop Assessment: no apparent nausea or vomiting Anesthetic complications: no   No notable events documented.  Last Vitals:  Vitals:   09/08/23 1115 09/08/23 1130  BP: 103/67 (!) 109/56  Pulse: 96 93  Resp: 19 18  Temp:    SpO2: 97% 94%    Last Pain:  Vitals:   09/08/23 1130  TempSrc:   PainSc: 0-No pain                 Jerrell Hart,W. EDMOND

## 2023-09-08 NOTE — Op Note (Signed)
 Video Bronchoscopy with Robotic Assisted Bronchoscopic Navigation   Date of Operation: 09/08/2023   Pre-op Diagnosis: Right upper lobe nodule  Post-op Diagnosis: Same  Surgeon: Lamar Chris  Assistants: None  Anesthesia: General endotracheal anesthesia  Operation: Flexible video fiberoptic bronchoscopy with robotic assistance and biopsies.  Estimated Blood Loss: Minimal  Complications: None  Indications and History: Erica Braun is a 73 y.o. female with history of tobacco use and squamous cell cancer of the esophagus.  We have been following pulmonary nodules on serial imaging, some of which are stable or smaller.  There is an enlarging right upper lobe pulmonary nodule suspicious for possible malignancy.  Recommendation made to achieve a tissue diagnosis via robotic assisted navigational bronchoscopy.  The risks, benefits, complications, treatment options and expected outcomes were discussed with the patient.  The possibilities of pneumothorax, pneumonia, reaction to medication, pulmonary aspiration, perforation of a viscus, bleeding, failure to diagnose a condition and creating a complication requiring transfusion or operation were discussed with the patient who freely signed the consent.    Description of Procedure: The patient was seen in the Preoperative Area, was examined and was deemed appropriate to proceed.  The patient was taken to [facility] Endoscopy room 3, identified as Erica Braun and the procedure verified as Flexible Video Fiberoptic Bronchoscopy.  A Time Out was held and the above information confirmed.   Prior to the date of the procedure a high-resolution CT scan of the chest was performed. Utilizing ION software program a virtual tracheobronchial tree was generated to allow the creation of distinct navigation pathways to the patient's parenchymal abnormalities. After being taken to the operating room general anesthesia was initiated and the patient  was  orally intubated. The video fiberoptic bronchoscope was introduced via the endotracheal tube and a general inspection was performed which showed a slightly raised polypoid area of mucosa in the distal trachea just proximal to the main carina at about 2 o'clock.  Otherwise normal right and left lung anatomy.  Endobronchial brushings and forceps biopsies were performed on the raised area in the distal trachea.  Aspiration of the bilateral mainstems was completed to remove any remaining secretions. Robotic catheter inserted into patient's endotracheal tube.   Target #1 right upper lobe nodule: The distinct navigation pathways prepared prior to this procedure were then utilized to navigate to patient's lesion identified on CT scan. The robotic catheter was secured into place and the vision probe was withdrawn.  Lesion location was approximated using fluoroscopy.  Local registration and targeting was performed using Siem[employer] Cios mobile C-arm three-dimensional imaging. Under fluoroscopic guidance transbronchial needle biopsies and transbronchial cryoprobe biopsies were performed to be sent for cytology and pathology.  Needle-in-lesion was confirmed using Cios mobile C-arm.  Under fluoroscopic guidance a single fiducial marker was placed adjacent to the nodule.  At the end of the procedure a general airway inspection was performed and there was no evidence of active bleeding. The bronchoscope was removed.  The patient tolerated the procedure well. There was no significant blood loss and there were no obvious complications. A post-procedural chest x-ray is pending.       Samples Target #1: 1. Transbronchial Wang needle biopsies from right upper lobe nodule 2. Transbronchial cryoprobe biopsies from right upper lobe nodule 3.  Endobronchial brushings from the distal trachea polypoid lesion 4. Endobronchial biopsies from distal trachea polypoid lesion   Plans:  The patient will be discharged  from the PACU to home when recovered from anesthesia and after  chest x-ray is reviewed. We will review the cytology, pathology and microbiology results with the patient when they become available. Outpatient followup will be with CANDIE Lites, NP and Dr [name].    Lamar Chris, MD, PhD 09/08/2023, 10:35 AM [facility] [phone] or if no answer before 7:00PM call [phone] For any issues after 7:00PM please call eLink [phone]

## 2023-09-09 LAB — CYTOLOGY - NON PAP

## 2023-09-09 LAB — SURGICAL PATHOLOGY

## 2023-09-10 ENCOUNTER — Encounter (HOSPITAL_COMMUNITY): Payer: Self-pay | Admitting: Emergency Medicine

## 2023-09-16 ENCOUNTER — Ambulatory Visit (INDEPENDENT_AMBULATORY_CARE_PROVIDER_SITE_OTHER): Admitting: Acute Care

## 2023-09-16 ENCOUNTER — Encounter: Payer: Self-pay | Admitting: Acute Care

## 2023-09-16 ENCOUNTER — Ambulatory Visit: Admitting: Pulmonary Disease

## 2023-09-16 ENCOUNTER — Ambulatory Visit: Admitting: Acute Care

## 2023-09-16 VITALS — BP 137/76 | HR 70 | Temp 97.6°F | Ht 61.0 in | Wt 137.6 lb

## 2023-09-16 DIAGNOSIS — Z87891 Personal history of nicotine dependence: Secondary | ICD-10-CM | POA: Diagnosis not present

## 2023-09-16 DIAGNOSIS — R911 Solitary pulmonary nodule: Secondary | ICD-10-CM

## 2023-09-16 DIAGNOSIS — Z9889 Other specified postprocedural states: Secondary | ICD-10-CM

## 2023-09-16 DIAGNOSIS — R9389 Abnormal findings on diagnostic imaging of other specified body structures: Secondary | ICD-10-CM

## 2023-09-16 DIAGNOSIS — J449 Chronic obstructive pulmonary disease, unspecified: Secondary | ICD-10-CM

## 2023-09-16 DIAGNOSIS — C3432 Malignant neoplasm of lower lobe, left bronchus or lung: Secondary | ICD-10-CM

## 2023-09-16 DIAGNOSIS — C3411 Malignant neoplasm of upper lobe, right bronchus or lung: Secondary | ICD-10-CM | POA: Diagnosis not present

## 2023-09-16 DIAGNOSIS — C3490 Malignant neoplasm of unspecified part of unspecified bronchus or lung: Secondary | ICD-10-CM

## 2023-09-16 LAB — PULMONARY FUNCTION TEST
DL/VA % pred: 84 %
DL/VA: 3.56 ml/min/mmHg/L
DLCO cor % pred: 68 %
DLCO cor: 11.63 ml/min/mmHg
DLCO unc % pred: 60 %
DLCO unc: 10.25 ml/min/mmHg
FEF 25-75 Post: 0.44 L/s
FEF 25-75 Pre: 0.38 L/s
FEF2575-%Change-Post: 14 %
FEF2575-%Pred-Post: 28 %
FEF2575-%Pred-Pre: 25 %
FEV1-%Change-Post: 8 %
FEV1-%Pred-Post: 58 %
FEV1-%Pred-Pre: 53 %
FEV1-Post: 1.06 L
FEV1-Pre: 0.97 L
FEV1FVC-%Change-Post: 0 %
FEV1FVC-%Pred-Pre: 71 %
FEV6-%Change-Post: 5 %
FEV6-%Pred-Post: 81 %
FEV6-%Pred-Pre: 76 %
FEV6-Post: 1.87 L
FEV6-Pre: 1.77 L
FEV6FVC-%Change-Post: -2 %
FEV6FVC-%Pred-Post: 101 %
FEV6FVC-%Pred-Pre: 104 %
FVC-%Change-Post: 8 %
FVC-%Pred-Post: 79 %
FVC-%Pred-Pre: 73 %
FVC-Post: 1.94 L
FVC-Pre: 1.8 L
Post FEV1/FVC ratio: 54 %
Post FEV6/FVC ratio: 96 %
Pre FEV1/FVC ratio: 54 %
Pre FEV6/FVC Ratio: 99 %
RV % pred: 193 %
RV: 4.05 L
TLC % pred: 136 %
TLC: 6.17 L

## 2023-09-16 NOTE — Progress Notes (Deleted)
 Erica Braun is              Erica Braun    [id number]    July 14, 1950  Primary Care Physician:Hawks, Bari LABOR, [name]  Referring Physician: Ruthell Lauraine FALCON, NP [address] [place],  [place] [address]  Chief complaint:   History of obstructive sleep apnea, COPD, lung nodules  HPI:  Feels relatively well  Was recently hospitalized for influenza A infection in March  has been doing relatively well with no significant exacerbation of symptoms    She is recovering  Currently uses Advair, she feels Advair is not working as well as when she was on Anoro  Former smoker, known to have a left lower lobe lung nodule which increased in size - She is following up for this-will be following up with Dr. Shelah in several Gross  History of squamous cell cancer of the esophagus  She is on CPAP and using CPAP regularly, CPAP pressures is 11 at present Seems to be tolerating this well  Outpatient Encounter Medications as of 09/16/2023  Medication Sig   acetaminophen  (TYLENOL ) 500 MG tablet Take 500 mg by mouth every 6 (six) hours as needed for mild pain (pain score 1-3).   ANORO ELLIPTA  62.5-25 MCG/ACT AEPB Inhale 1 puff by mouth once daily   atenolol  (TENORMIN ) 25 MG tablet Take 0.5 tablets (12.5 mg total) by mouth 2 (two) times daily.   calcium  carbonate (OSCAL) 1500 (600 Ca) MG TABS tablet Take 600 mg by mouth in the morning and at bedtime.   empagliflozin  (JARDIANCE ) 25 MG TABS tablet Take 1 tablet (25 mg total) by mouth daily before breakfast.   famotidine  (PEPCID ) 40 MG tablet Take 1 tablet (40 mg total) by mouth at bedtime.   ferrous sulfate  325 (65 FE) MG EC tablet Take 1 tablet (325 mg total) by mouth daily with breakfast.   glimepiride  (AMARYL ) 2 MG tablet Take 1 tablet (2 mg total) by mouth daily as needed (blood sugar more than 200.).   ipratropium-albuterol  (DUONEB) 0.5-2.5 (3) MG/3ML SOLN Take 3 mLs by nebulization every 8 (eight) hours.   isosorbide   mononitrate (IMDUR ) 30 MG 24 hr tablet Take 0.5 tablets (15 mg total) by mouth daily.   metFORMIN  (GLUCOPHAGE ) 1000 MG tablet Take 1 tablet (1,000 mg total) by mouth 2 (two) times daily with a meal.   Multiple Vitamins-Minerals (ADULT ONE DAILY GUMMIES PO) Take 2 tablets by mouth in the morning. Women's Multi by [employer]   nitroGLYCERIN  (NITROSTAT ) 0.4 MG SL tablet Place 1 tablet (0.4 mg total) under the tongue every 5 (five) minutes as needed for chest pain.   omeprazole  (PRILOSEC) 40 MG capsule Take 1 capsule (40 mg total) by mouth daily.   rosuvastatin  (CRESTOR ) 10 MG tablet Take 1 tablet by mouth once daily   No facility-administered encounter medications on file as of 09/16/2023.    Allergies as of 09/16/2023   (No Known Allergies)    Past Medical History:  Diagnosis Date   Complication of anesthesia    COPD (chronic obstructive pulmonary disease) (HCC)    Coronary artery disease    Diabetes mellitus without complication (HCC)    Diverticulosis    Early cataracts, bilateral 11/21/2016   Esophageal cancer (HCC)    GERD (gastroesophageal reflux disease)    Glaucoma    patient denies   Hyperlipidemia    Hypertension    Internal hemorrhoids    Osteoporosis 12/16/2021   Pneumonia  PONV (postoperative nausea and vomiting)    Sleep apnea    uses Cpap   Spasm of the cricopharyngeus muscle    Squamous cell esophageal cancer (HCC) 05/10/2018    Past Surgical History:  Procedure Laterality Date   ABDOMINAL HYSTERECTOMY     cervical dysplasia   BIOPSY  05/03/2018   Procedure: BIOPSY;  Surgeon: Wilhelmenia Aloha Raddle., MD;  Location: [facility] ENDOSCOPY;  Service: Gastroenterology;;   BREAST BIOPSY Left    BREAST BIOPSY Right 11/27/2021   MM RT BREAST BX W LOC DEV 1ST LESION IMAGE BX SPEC STEREO GUIDE 11/27/2021 [facility] MAMMOGRAPHY   BREAST BIOPSY  01/14/2022   MM RT RADIOACTIVE SEED LOC MAMMO GUIDE 01/14/2022 [facility]MOGR[facility]   BREAST LUMPECTOMY Left    BREAST LUMPECTOMY WITH  RADIOACTIVE SEED LOCALIZATION Right 01/15/2022   Procedure: RIGHT BREAST LUMPECTOMY WITH RADIOACTIVE SEED LOCALIZATION;  Surgeon: [name] Ned, MD;  Location: [facility] OR;  Service: General;  Laterality: Right;   ESOPHAGOGASTRODUODENOSCOPY (EGD) WITH PROPOFOL  N/A 05/03/2018   Procedure: ESOPHAGOGASTRODUODENOSCOPY (EGD) WITH PROPOFOL ;  Surgeon: Wilhelmenia Aloha Raddle., MD;  Location: [facility] ENDOSCOPY;  Service: Gastroenterology;  Laterality: N/A;   ESOPHAGOGASTRODUODENOSCOPY (EGD) WITH PROPOFOL  N/A 11/18/2018   Procedure: ESOPHAGOGASTRODUODENOSCOPY (EGD) WITH PROPOFOL ;  Surgeon: Abran Norleen SAILOR, MD;  Location: [facility] ENDOSCOPY;  Service: Endoscopy;  Laterality: N/A;   EUS  05/03/2018   Procedure: UPPER ENDOSCOPIC ULTRASOUND (EUS) RADIAL;  Surgeon: Wilhelmenia Aloha Raddle., MD;  Location: [facility] ENDOSCOPY;  Service: Gastroenterology;;   FOREIGN BODY REMOVAL  11/18/2018   Procedure: FOREIGN BODY REMOVAL;  Surgeon: Abran Norleen SAILOR, MD;  Location: [facility] ENDOSCOPY;  Service: Endoscopy;;   LEFT HEART CATH AND CORONARY ANGIOGRAPHY N/A 06/11/2017   Procedure: LEFT HEART CATH AND CORONARY ANGIOGRAPHY;  Surgeon: [name] Victory ORN, MD;  Location: [facility] INVASIVE CV LAB;  Service: Cardiovascular;  Laterality: N/A;   VIDEO BRONCHOSCOPY WITH ENDOBRONCHIAL NAVIGATION Right 09/08/2023   Procedure: VIDEO BRONCHOSCOPY WITH ENDOBRONCHIAL NAVIGATION;  Surgeon: Shelah Lamar RAMAN, MD;  Location: [facility] ENDOSCOPY;  Service: Pulmonary;  Laterality: Right;    Family History  Problem Relation Age of Onset   Breast cancer Mother        dx 1s; mets   Arthritis Mother    COPD Father 31       emphysema   Alcohol abuse Father    Diabetes Father    Breast cancer Sister        dx late 54s; mat half sister   Early death Brother        overdose   Drug abuse Brother    Alcohol abuse Paternal Aunt    COPD Paternal Aunt    Throat cancer Paternal Uncle    Liver cancer Maternal Grandmother        or other primary; d. late 59s   Diabetes Maternal Grandmother     Alcohol abuse Maternal Grandfather    Early death Maternal Grandfather    Stroke Paternal Grandmother    Heart disease Paternal Grandfather    Esophageal cancer Neg Hx    Stomach cancer Neg Hx    Colon cancer Neg Hx     Social History   Socioeconomic History   Marital status: Married    Spouse name: [name]   Number of children: 3   Years of education: 14   Highest education level: Some college, no degree  Occupational History   Occupation: retired    Comment: Radiation protection practitioner  Tobacco Use   Smoking status: Former    Current packs/day:  0.00    Average packs/day: 1.5 packs/day for 41.0 years (61.5 ttl pk-yrs)    Types: Cigarettes    Start date: 73    Quit date: 2010    Years since quitting: 15.7    Passive exposure: Past   Smokeless tobacco: Never  Vaping Use   Vaping status: Never Used  Substance and Sexual Activity   Alcohol use: Not Currently    Comment: wine occasionaly   Drug use: No   Sexual activity: Yes    Birth control/protection: Post-menopausal  Other Topics Concern   Not on file  Social History Narrative   Recent move from [place]    Likes to walk   Married to [name] dog at home   Social Drivers of Health   Financial Resource Strain: Low Risk  (03/26/2023)   Overall Financial Resource Strain (CARDIA)    Difficulty of Paying Living Expenses: Not hard at all  Food Insecurity: No Food Insecurity (03/31/2023)   Hunger Vital Sign    Worried About Running Out of Food in the Last Year: Never true    Ran Out of Food in the Last Year: Never true  Transportation Needs: No Transportation Needs (03/31/2023)   PRAPARE - Administrator, Civil Service (Medical): No    Lack of Transportation (Non-Medical): No  Physical Activity: Insufficiently Active (03/26/2023)   Exercise Vital Sign    Days of Exercise per Week: 3 days    Minutes of Exercise per Session: 20 min  Stress: No Stress Concern Present (03/26/2023)   [employer] of Occupational Health -  Occupational Stress Questionnaire    Feeling of Stress : Only a little  Social Connections: Socially Integrated (03/28/2023)   Social Connection and Isolation Panel    Frequency of Communication with Friends and Family: More than three times a week    Frequency of Social Gatherings with Friends and Family: More than three times a week    Attends Religious Services: More than 4 times per year    Active Member of [employer] or Organizations: Yes    Attends Engineer, structural: More than 4 times per year    Marital Status: Married  Catering manager Violence: Not At Risk (03/31/2023)   Humiliation, Afraid, Rape, and Kick questionnaire    Fear of Current or Ex-Partner: No    Emotionally Abused: No    Physically Abused: No    Sexually Abused: No    Review of Systems  Respiratory:  Positive for shortness of breath.   Psychiatric/Behavioral:  Positive for sleep disturbance.     There were no vitals filed for this visit.    Physical Exam Constitutional:      Appearance: Normal appearance.  HENT:     Head: Normocephalic.     Nose: Nose normal.     Mouth/Throat:     Mouth: Mucous membranes are moist.  Cardiovascular:     Rate and Rhythm: Normal rate and regular rhythm.     Heart sounds: No murmur heard.    No friction rub.  Pulmonary:     Effort: No respiratory distress.     Breath sounds: No stridor. No wheezing.  Musculoskeletal:     Cervical back: Tenderness present. No rigidity.  Neurological:     Mental Status: She is alert.  Psychiatric:        Mood and Affect: Mood normal.      Data Reviewed: CPAP compliance reviewed showing excellent compliance at 100% CPAP of  11, AHI of 0.3, no significant air leaks  PET scan January 2025 was reviewed  Assessment:  Obstructive sleep apnea adequately treated with CPAP therapy  Chronic obstructive pulmonary disease - On Advair - Anoro worked better for her  Recent exacerbation of COPD with influenza A infection - Was  discharged on home oxygen  - Has been checking her oxygen  on a regular basis and she is always above 90% - Can discontinue oxygen  supplementation  Plan/Recommendations:  Can discontinue oxygen  supplementation  Prescription placed for Anoro  Continue CPAP use on a nightly basis  Follow-up for lung nodule  Follow-up in the office in about 6 months  Encouraged to call with significant concerns   Jennet Epley MD [facility] 09/16/2023, 12:49 PM  CC: Ruthell Lauraine FALCON, NP

## 2023-09-16 NOTE — Patient Instructions (Addendum)
 It is good to see you today. I am glad you have done well after the bronchoscopy with biopsies. We have reviewed the results of your biopsy and it shows a squamous cell lung cancer in the left lower lobe pulmonary nodule that we have been watching. I have referred you to radiation oncology, and thoracic surgery to be evaluated. You will receive phone calls from both specialties to schedule consultations to discuss both treatment options. I have ordered pulmonary function tests, a restaging PET scan, and an MRI of the brain to complete the staging process. You will get phone calls to get the schedule. You will not get a phone call to schedule your surgical consult until restaging testing has been completed and until pulmonary function has been done. This will determine whether or not you are a surgical candidate. They will take great care of you regardless of the option you choose. Follow-up with Dr. [name] in April 2026 for your annual visit. Call if you need us  sooner. Note your daily symptoms > remember red flags for COPD:  Increase in cough, increase in sputum production, increase in shortness of breath or activity intolerance. If you notice these symptoms, please call to be seen.   Please contact office for sooner follow up if symptoms do not improve or worsen or seek emergency care

## 2023-09-16 NOTE — Progress Notes (Signed)
 History of Present Illness Erica Braun is a 73 y.o. female emale former smoker with a 61 pack year smoking history, Quit 2010. She has a history of squamous cell carcinoma of the esophagus.  She is followed through the lung cancer screening program for  abnormal chest imaging. She will be followed by Dr. Shelah for the lung nodule, and Dr. [name] for COPD and sleep apnea.   09/16/2023 Discussed the use of AI scribe software for clinical note transcription with the patient, who gave verbal consent to proceed.  Synopsis Erica Braun is a 73 y.o. female former smoker with a 61 pack year smoking history, Quit 2010. She has a history of squamous cell carcinoma of the esophagus.  She is followed through the lung cancer screening program. She is here for consultation regarding a growing left lower lobe lung mass.She also has a history of asthma and OSA on CPAP, followed by Dr. [name].  She will be followed by Dr. Cloyce and [name], NP.for the lung nodules.   History of Present Illness Pt. Presents for follow up after bronchoscopy with biopsies on 09/08/2023.She states she has been doing well since the procedure. She did have a cough after the procedure. She had a bit of a sore throat., which has resolved. No fever, or worsening shortness of breath.   Erica Braun [place] is a 73 year old female with a history of esophageal squamous cell carcinoma who presents for follow-up after a lung biopsy.  She is recovering well post-procedure with only a mild sore throat that has since resolved. She has a persistent cough since the procedure but no hemoptysis. Antibiotics administered prior to the procedure significantly improved her breathing.  She continues to experience shortness of breath, which she describes as her baseline, without any recent worsening. During a previous flare, she coughed up secretions, which have since cleared.  A recent biopsy of the right upper lung  revealed squamous cell lung cancer. She has a history of esophageal squamous cell carcinoma. The distal trachea biopsy was negative for malignancy. A PET scan from January showed low-grade uptake in the right upper lobe, initially thought to be an atypical infection.  We have discussed the results of the biopsy. We have discussed that the best option for treatment is for either surgery or radiation . Pt. States she does not want surgery. I will make a referral to radiation oncology , and will order PFT's  in case she changes her mind about surgery once we have staging complete. She is processing the new diagnosis, and may have a change of heart.   She underwent pulmonary function testing in 2017, which showed moderate obstruction. I will get PFT's to restage her disease and to see if she is a surgical candidate..       Test Results: Surgical Pathology 09/08/2023 A. DISTAL TRACHEA, POLYPOID LESION, ENDOBRONCHIAL BIOPSY:  Benign tracheal mucosa and hyaline cartilage showing degenerative  changes  Negative for malignancy   Cytology 09/08/2023 A.  RIGHT LUNG, UPPER LOBE, NODULE, FINE NEEDLE ASPIRATION  BIOPSIES:  - Malignant  - Squamous cell carcinoma   B. DISTAL TRACHEA, POLYPOID LESION, BRUSHING:  - Negative for malignancy  - Numerous benign bronchial cells     Latest Ref Rng & Units 08/25/2023   11:42 AM 04/16/2023    3:33 PM 03/28/2023    5:41 AM  CBC  WBC 3.4 - 10.8 x10E3/uL 4.6  3.6  3.1   Hemoglobin 11.1 - 15.9 g/dL  10.1  10.0  10.0   Hematocrit 34.0 - 46.6 % 33.3  32.5  32.7   Platelets 150 - 450 x10E3/uL 165  118  136        Latest Ref Rng & Units 08/25/2023   11:42 AM 04/16/2023    3:33 PM 03/29/2023    4:44 AM  BMP  Glucose 70 - 99 mg/dL 827  899  865   BUN 8 - 27 mg/dL 15  11  26    Creatinine 0.57 - 1.00 mg/dL 9.31  9.29  9.35   BUN/Creat Ratio 12 - 28 22  16     Sodium 134 - 144 mmol/L 137  142  135   Potassium 3.5 - 5.2 mmol/L 4.4  4.3  4.2   Chloride 96 - 106 mmol/L 98   104  102   CO2 20 - 29 mmol/L 24  24  27    Calcium  8.7 - 10.3 mg/dL 9.5  9.6  8.9     BNP    Component Value Date/Time   BNP 306.0 (H) 03/27/2023 1909    ProBNP No results found for: PROBNP  PFT    Component Value Date/Time   FEV1PRE 0.88 12/11/2017 1149   FEV1POST 0.98 12/11/2017 1149   FVCPRE 1.50 12/11/2017 1149   FVCPOST 1.64 12/11/2017 1149   TLC 5.01 12/11/2017 1149   DLCOUNC 11.95 12/11/2017 1149   PREFEV1FVCRT 58 12/11/2017 1149   PSTFEV1FVCRT 60 12/11/2017 1149    CT SUPER D CHEST WO CONTRAST Result Date: 09/14/2023 CLINICAL DATA:  Follow-up of enlarging right upper lobe pulmonary nodule. Breast cancer last year. * Tracking Code: BO * EXAM: CT CHEST WITHOUT CONTRAST TECHNIQUE: Multidetector CT imaging of the chest was performed using thin slice collimation for electromagnetic bronchoscopy planning purposes, without intravenous contrast. RADIATION DOSE REDUCTION: This exam was performed according to the departmental dose-optimization program which includes automated exposure control, adjustment of the mA and/or kV according to patient size and/or use of iterative reconstruction technique. COMPARISON:  04/22/2023 FINDINGS: Cardiovascular: Aortic atherosclerosis. Normal heart size, without pericardial effusion. Three vessel coronary artery calcification. Mediastinum/Nodes: No supraclavicular adenopathy. Surgical absence versus atrophy/agenesis of the left lobe of the thyroid . No mediastinal or hilar adenopathy, given limitations of unenhanced CT. Lungs/Pleura: No pleural fluid.  Moderate centrilobular emphysema. Irregular right upper lobe pulmonary nodule measures 9 x 6 mm (similar in transverse dimension) on 37/4. Based on sagittal reformats, 9 mm craniocaudal on image 47 today versus 6 mm on image 52 sagittal on the prior. Scattered calcified granulomas. Right middle lobe, less so lingular volume loss and scarring are unchanged. Medial left lower lobe pulmonary nodule measures 8  x 6 mm on 111/4 versus 6 x 3 mm on the prior. Upper Abdomen: Normal imaged portions of the liver, stomach, adrenal glands, left kidney. Parenchymal calcifications versus collecting system stone of 4 mm in the upper pole right kidney. Fatty replaced but incompletely imaged pancreas. Old granulomatous disease in the spleen. Musculoskeletal: Osteopenia.  Cervical ribs.  Thoracic spondylosis. IMPRESSION: 1. Mild enlargement (primarily in craniocaudal span) of a 9 mm right upper lobe pulmonary nodule. This is suspicious for primary bronchogenic carcinoma, given morphology and upper lobe location. 2. Mild enlargement of an 8 mm left lower lobe pulmonary nodule, favoring synchronous primary bronchogenic carcinoma versus less likely metastasis. 3. No thoracic adenopathy. 4. Incidental findings, including: Aortic atherosclerosis (ICD10-I70.0), coronary artery atherosclerosis and emphysema (ICD10-J43.9). Electronically Signed   By: Rockey Kilts M.D.   On: 09/14/2023 11:29  DG Chest Port 1 View Result Date: 09/08/2023 CLINICAL DATA:  Status post bronchoscopy with biopsy EXAM: PORTABLE CHEST 1 VIEW COMPARISON:  September 03, 2023 CT in prior studies FINDINGS: Right upper lobe pulmonary nodule with adjacent metallic clip identified. Chronic interstitial findings and additional pulmonary nodules, better seen on prior chest CT. No focal consolidation. No pleural effusions. No pneumothorax. Unchanged cardiomediastinal silhouette. IMPRESSION: Right upper lobe pulmonary nodule with adjacent metallic clip in place. No pneumothorax. Electronically Signed   By: Michaeline Blanch M.D.   On: 09/08/2023 12:00   DG C-ARM BRONCHOSCOPY Result Date: 09/08/2023 C-ARM BRONCHOSCOPY: Fluoroscopy was utilized by the requesting physician.  No radiographic interpretation.     Past medical hx Past Medical History:  Diagnosis Date   Complication of anesthesia    COPD (chronic obstructive pulmonary disease) (HCC)    Coronary artery disease     Diabetes mellitus without complication (HCC)    Diverticulosis    Early cataracts, bilateral 11/21/2016   Esophageal cancer (HCC)    GERD (gastroesophageal reflux disease)    Glaucoma    patient denies   Hyperlipidemia    Hypertension    Internal hemorrhoids    Osteoporosis 12/16/2021   Pneumonia    PONV (postoperative nausea and vomiting)    Sleep apnea    uses Cpap   Spasm of the cricopharyngeus muscle    Squamous cell esophageal cancer (HCC) 05/10/2018     Social History   Tobacco Use   Smoking status: Former    Current packs/day: 0.00    Average packs/day: 1.5 packs/day for 41.0 years (61.5 ttl pk-yrs)    Types: Cigarettes    Start date: 64    Quit date: 2010    Years since quitting: 15.7    Passive exposure: Past   Smokeless tobacco: Never  Vaping Use   Vaping status: Never Used  Substance Use Topics   Alcohol use: Not Currently    Comment: wine occasionaly   Drug use: No    Ms.Mckinzie reports that she quit smoking about 15 years ago. Her smoking use included cigarettes. She started smoking about 56 years ago. She has a 61.5 pack-year smoking history. She has been exposed to tobacco smoke. She has never used smokeless tobacco. She reports that she does not currently use alcohol. She reports that she does not use drugs.  Tobacco Cessation: Counseling given: Not Answered Former smoker with a 61.5 pack year smoking history.  Past surgical hx, Family hx, Social hx all reviewed.  Current Outpatient Medications on File Prior to Visit  Medication Sig   acetaminophen  (TYLENOL ) 500 MG tablet Take 500 mg by mouth every 6 (six) hours as needed for mild pain (pain score 1-3).   ANORO ELLIPTA  62.5-25 MCG/ACT AEPB Inhale 1 puff by mouth once daily   atenolol  (TENORMIN ) 25 MG tablet Take 0.5 tablets (12.5 mg total) by mouth 2 (two) times daily.   calcium  carbonate (OSCAL) 1500 (600 Ca) MG TABS tablet Take 600 mg by mouth in the morning and at bedtime.   empagliflozin   (JARDIANCE ) 25 MG TABS tablet Take 1 tablet (25 mg total) by mouth daily before breakfast.   famotidine  (PEPCID ) 40 MG tablet Take 1 tablet (40 mg total) by mouth at bedtime.   ferrous sulfate  325 (65 FE) MG EC tablet Take 1 tablet (325 mg total) by mouth daily with breakfast.   glimepiride  (AMARYL ) 2 MG tablet Take 1 tablet (2 mg total) by mouth daily as needed (blood sugar more than  200.).   ipratropium-albuterol  (DUONEB) 0.5-2.5 (3) MG/3ML SOLN Take 3 mLs by nebulization every 8 (eight) hours.   isosorbide  mononitrate (IMDUR ) 30 MG 24 hr tablet Take 0.5 tablets (15 mg total) by mouth daily.   metFORMIN  (GLUCOPHAGE ) 1000 MG tablet Take 1 tablet (1,000 mg total) by mouth 2 (two) times daily with a meal.   Multiple Vitamins-Minerals (ADULT ONE DAILY GUMMIES PO) Take 2 tablets by mouth in the morning. Women's Multi by [employer]   nitroGLYCERIN  (NITROSTAT ) 0.4 MG SL tablet Place 1 tablet (0.4 mg total) under the tongue every 5 (five) minutes as needed for chest pain.   omeprazole  (PRILOSEC) 40 MG capsule Take 1 capsule (40 mg total) by mouth daily.   rosuvastatin  (CRESTOR ) 10 MG tablet Take 1 tablet by mouth once daily   No current facility-administered medications on file prior to visit.     No Known Allergies  Review Of Systems:  Constitutional:   No  weight loss, night sweats,  Fevers, chills, fatigue, or  lassitude.  HEENT:   No headaches,  Difficulty swallowing,  Tooth/dental problems, or  Sore throat,                No sneezing, itching, ear ache, nasal congestion, post nasal drip,   CV:  No chest pain,  Orthopnea, PND, swelling in lower extremities, anasarca, dizziness, palpitations, syncope.   GI  No heartburn, indigestion, abdominal pain, nausea, vomiting, diarrhea, change in bowel habits, loss of appetite, bloody stools.   Resp: + shortness of breath with exertion less at rest.  No excess mucus, no productive cough,  No non-productive cough,  No coughing up of blood.  No change  in color of mucus.  No wheezing.  No chest wall deformity  Skin: no rash or lesions.  GU: no dysuria, change in color of urine, no urgency or frequency.  No flank pain, no hematuria   MS:  No joint pain or swelling.  No decreased range of motion.  No back pain.  Psych:  No change in mood or affect. No depression or anxiety.  No memory loss.   Vital Signs BP 137/76   Pulse 70   Temp 97.6 F (36.4 C) (Oral)   Ht 5' 1 (1.549 m)   Wt 137 lb 9.6 oz (62.4 kg)   SpO2 95%   BMI 26.00 kg/m    Physical Exam:  General- No distress,  A&Ox3, pleasant ENT: No sinus tenderness, TM clear, pale nasal mucosa, no oral exudate,no post nasal drip, no LAN Cardiac: S1, S2, regular rate and rhythm, no murmur Chest: No wheeze/ rales/ dullness; no accessory muscle use, no nasal flaring, no sternal retractions Abd.: Soft Non-tender, ND, BS +, Body mass index is 26 kg/m.  Ext: No clubbing cyanosis, edema, no obvious deformities Neuro:  normal strength, MAE x 4, A&O x 3, appropriate Skin: No rashes, warm and dry, no obvious lesions  Psych: normal mood and behavior   Assessment/Plan  Assessment and Plan Assessment & Plan Non-small cell lung cancer, right upper lobe Biopsy confirmed squamous cell lung cancer,  January PET scan showed low-grade uptake, monitored for malignancy. - Complete staging with repeat PET scan and MRI of the brain. - Discuss treatment options: SBRT and surgical resection, contingent on staging results. - Emphasized shared decision-making with input from radiation oncology and thoracic surgery. - Refer to radiation oncology for consultation on SBRT. - Refer to thoracic surgery for evaluation of surgical options if patient changes her mind about surgery, and  she is a surgical candidate. - Order pulmonary function tests to assess surgical candidacy.  Chronic obstructive pulmonary disease (COPD) COPD is well-managed with no recent exacerbations. Reports baseline dyspnea  without worsening. - Schedule follow-up with Dr. Milagros in April 2026. - Advise to contact healthcare provider if experiencing a COPD flare. -Note your daily symptoms > remember red flags for COPD:  Increase in cough, increase in sputum production, increase in shortness of breath or activity intolerance. If you notice these symptoms, please call to be seen.       I spent 35 minutes dedicated to the care of this patient on the date of this encounter to include pre-visit review of records, face-to-face time with the patient discussing conditions above, post visit ordering of testing, clinical documentation with the electronic health record, making appropriate referrals as documented, and communicating necessary information to the patient's healthcare team.    Lauraine JULIANNA Lites, NP 09/16/2023  8:55 AM

## 2023-09-17 ENCOUNTER — Other Ambulatory Visit: Payer: Self-pay

## 2023-09-17 ENCOUNTER — Telehealth: Payer: Self-pay | Admitting: Radiation Oncology

## 2023-09-17 NOTE — Telephone Encounter (Signed)
 9/11 @ 2:26 pm Left voicemail for patient to call our office to be sch for consult.

## 2023-09-18 NOTE — Progress Notes (Signed)
 The proposed treatment discussed in conference is for discussion purpose only and is not a binding recommendation.  The patients have not been physically examined, or presented with their treatment options.  Therefore, final treatment plans cannot be decided.

## 2023-09-22 ENCOUNTER — Ambulatory Visit (HOSPITAL_COMMUNITY)
Admission: RE | Admit: 2023-09-22 | Discharge: 2023-09-22 | Disposition: A | Discharge status: Home / Self Care | Source: Ambulatory Visit | Attending: Acute Care | Admitting: Acute Care

## 2023-09-22 DIAGNOSIS — C3490 Malignant neoplasm of unspecified part of unspecified bronchus or lung: Secondary | ICD-10-CM | POA: Diagnosis not present

## 2023-09-22 MED ORDER — GADOBUTROL 1 MMOL/ML IV SOLN
6.0000 mL | Freq: Once | INTRAVENOUS | Status: AC | PRN
  Administered 2023-09-22: 6 mL via INTRAVENOUS

## 2023-09-24 ENCOUNTER — Ambulatory Visit (HOSPITAL_COMMUNITY)
Admission: RE | Admit: 2023-09-24 | Discharge: 2023-09-24 | Disposition: A | Discharge status: Home / Self Care | Source: Ambulatory Visit | Attending: Acute Care | Admitting: Acute Care

## 2023-09-24 DIAGNOSIS — C3490 Malignant neoplasm of unspecified part of unspecified bronchus or lung: Secondary | ICD-10-CM | POA: Diagnosis not present

## 2023-09-24 DIAGNOSIS — R918 Other nonspecific abnormal finding of lung field: Secondary | ICD-10-CM | POA: Diagnosis not present

## 2023-09-24 MED ORDER — FLUDEOXYGLUCOSE F - 18 (FDG) INJECTION
7.3600 | Freq: Once | INTRAVENOUS | Status: AC | PRN
  Administered 2023-09-24: 7.36 via INTRAVENOUS

## 2023-09-29 ENCOUNTER — Ambulatory Visit
Attending: Thoracic Surgery (Cardiothoracic Vascular Surgery) | Admitting: Thoracic Surgery (Cardiothoracic Vascular Surgery)

## 2023-09-29 ENCOUNTER — Encounter: Payer: Self-pay | Admitting: Thoracic Surgery (Cardiothoracic Vascular Surgery)

## 2023-09-29 VITALS — BP 129/69 | HR 84 | Resp 20 | Ht 61.0 in | Wt 137.0 lb

## 2023-09-29 DIAGNOSIS — C3411 Malignant neoplasm of upper lobe, right bronchus or lung: Secondary | ICD-10-CM | POA: Diagnosis not present

## 2023-09-29 NOTE — Progress Notes (Signed)
 PCP is [name], [name] Referring Provider is Ruthell Lauraine FALCON, NP  Chief Complaint  Patient presents with   Lung Cancer    Surgical consult    HPI: Erica Braun is sent for consultation regarding bilateral lung nodules.  Erica Braun is a 73 year old woman with a history of heavy tobacco use, COPD, esophageal cancer, hypertension, hyperlipidemia, coronary artery disease, aortic atherosclerosis, sleep apnea, type 2 diabetes without complication, and reflux.  She smoked about a pack and half a day for 40 years (60 pack years) prior to quitting in 2010.  She had an endoscopic submucosal dissection for early-stage esophageal cancer in 2020 at [facility].  She is followed through the lung cancer screening program due to her smoking history.  In September 2024 she was found to have new nodules in the right upper lobe and left lower lobe.  On a follow-up scan 3 months later the left lower lobe nodule is increased in size.  A PET/CT showed an SUV of 2.0.  The right upper lobe nodularity had an SUV of 2.5.  Another follow-up in April showed the right upper lobe nodule had increased in size.  She then had a super D CT in August which showed both nodules had increased slightly.  Morphology more consistent with new primaries versus metastatic disease.  There was no adenopathy.  She underwent robotic bronchoscopy on 09/08/2023.  The right upper lobe nodule was positive for adenocarcinoma.  Dr. Shelah noted a distal tracheal polypoid lesion.  Biopsies of that were benign.  Unclear if attempt was made to biopsy the left lower lobe nodule.  Repeat PET 09/24/2023 showed both nodules had mild metabolic uptake.  She gets short of breath with exertion.  She recently had a COPD flare.  Has improved since she was started on Anoro Ellipta .  Has had decreased appetite and weight loss which coincided with starting Jardiance . Past Medical History:  Diagnosis Date   Complication of anesthesia    COPD  (chronic obstructive pulmonary disease) (HCC)    Coronary artery disease    Diabetes mellitus without complication (HCC)    Diverticulosis    Early cataracts, bilateral 11/21/2016   Esophageal cancer (HCC)    GERD (gastroesophageal reflux disease)    Glaucoma    patient denies   Hyperlipidemia    Hypertension    Internal hemorrhoids    Osteoporosis 12/16/2021   Pneumonia    PONV (postoperative nausea and vomiting)    Sleep apnea    uses Cpap   Spasm of the cricopharyngeus muscle    Squamous cell esophageal cancer (HCC) 05/10/2018    Past Surgical History:  Procedure Laterality Date   ABDOMINAL HYSTERECTOMY     cervical dysplasia   BIOPSY  05/03/2018   Procedure: BIOPSY;  Surgeon: Wilhelmenia Aloha Raddle., MD;  Location: [facility] ENDOSCOPY;  Service: Gastroenterology;;   BREAST BIOPSY Left    BREAST BIOPSY Right 11/27/2021   MM RT BREAST BX W LOC DEV 1ST LESION IMAGE BX SPEC STEREO GUIDE 11/27/2021 [facility]   BREAST BIOPSY  01/14/2022   MM RT RADIOACTIVE SEED LOC MAMMO GUIDE 01/14/2022 [facility]   BREAST LUMPECTOMY Left    BREAST LUMPECTOMY WITH RADIOACTIVE SEED LOCALIZATION Right 01/15/2022   Procedure: RIGHT BREAST LUMPECTOMY WITH RADIOACTIVE SEED LOCALIZATION;  Surgeon: [name] Ned, MD;  Location: [facility] OR;  Service: General;  Laterality: Right;   ESOPHAGOGASTRODUODENOSCOPY (EGD) WITH PROPOFOL  N/A 05/03/2018   Procedure: ESOPHAGOGASTRODUODENOSCOPY (EGD) WITH PROPOFOL ;  Surgeon: Wilhelmenia Aloha Raddle., MD;  Location: [facility] ENDOSCOPY;  Service: Gastroenterology;  Laterality: N/A;   ESOPHAGOGASTRODUODENOSCOPY (EGD) WITH PROPOFOL  N/A 11/18/2018   Procedure: ESOPHAGOGASTRODUODENOSCOPY (EGD) WITH PROPOFOL ;  Surgeon: Abran Norleen SAILOR, MD;  Location: [facility] ENDOSCOPY;  Service: Endoscopy;  Laterality: N/A;   EUS  05/03/2018   Procedure: UPPER ENDOSCOPIC ULTRASOUND (EUS) RADIAL;  Surgeon: Wilhelmenia Aloha Raddle., MD;  Location: [facility] ENDOSCOPY;  Service: Gastroenterology;;   FOREIGN BODY  REMOVAL  11/18/2018   Procedure: FOREIGN BODY REMOVAL;  Surgeon: Abran Norleen SAILOR, MD;  Location: [facility] ENDOSCOPY;  Service: Endoscopy;;   LEFT HEART CATH AND CORONARY ANGIOGRAPHY N/A 06/11/2017   Procedure: LEFT HEART CATH AND CORONARY ANGIOGRAPHY;  Surgeon: [name] Victory ORN, MD;  Location: [facility] INVASIVE CV LAB;  Service: Cardiovascular;  Laterality: N/A;   VIDEO BRONCHOSCOPY WITH ENDOBRONCHIAL NAVIGATION Right 09/08/2023   Procedure: VIDEO BRONCHOSCOPY WITH ENDOBRONCHIAL NAVIGATION;  Surgeon: Shelah Lamar RAMAN, MD;  Location: [facility] ENDOSCOPY;  Service: Pulmonary;  Laterality: Right;    Family History  Problem Relation Age of Onset   Breast cancer Mother        dx 58s; mets   Arthritis Mother    COPD Father 27       emphysema   Alcohol abuse Father    Diabetes Father    Breast cancer Sister        dx late 55s; mat half sister   Early death Brother        overdose   Drug abuse Brother    Alcohol abuse Paternal Aunt    COPD Paternal Aunt    Throat cancer Paternal Uncle    Liver cancer Maternal Grandmother        or other primary; d. late 71s   Diabetes Maternal Grandmother    Alcohol abuse Maternal Grandfather    Early death Maternal Grandfather    Stroke Paternal Grandmother    Heart disease Paternal Grandfather    Esophageal cancer Neg Hx    Stomach cancer Neg Hx    Colon cancer Neg Hx     Social History Social History   Tobacco Use   Smoking status: Former    Current packs/day: 0.00    Average packs/day: 1.5 packs/day for 41.0 years (61.5 ttl pk-yrs)    Types: Cigarettes    Start date: 21    Quit date: 2010    Years since quitting: 15.7    Passive exposure: Past   Smokeless tobacco: Never  Vaping Use   Vaping status: Never Used  Substance Use Topics   Alcohol use: Not Currently    Comment: wine occasionaly   Drug use: No    Current Outpatient Medications  Medication Sig Dispense Refill   acetaminophen  (TYLENOL ) 500 MG tablet Take 500 mg by mouth every 6 (six) hours as  needed for mild pain (pain score 1-3).     ANORO ELLIPTA  62.5-25 MCG/ACT AEPB Inhale 1 puff by mouth once daily 60 each 0   atenolol  (TENORMIN ) 25 MG tablet Take 0.5 tablets (12.5 mg total) by mouth 2 (two) times daily.     calcium  carbonate (OSCAL) 1500 (600 Ca) MG TABS tablet Take 600 mg by mouth in the morning and at bedtime.     empagliflozin  (JARDIANCE ) 25 MG TABS tablet Take 1 tablet (25 mg total) by mouth daily before breakfast. 90 tablet 2   famotidine  (PEPCID ) 40 MG tablet Take 1 tablet (40 mg total) by mouth at bedtime. 90 tablet 3   ferrous sulfate  325 (65 FE) MG EC tablet Take  1 tablet (325 mg total) by mouth daily with breakfast. 90 tablet 1   glimepiride  (AMARYL ) 2 MG tablet Take 1 tablet (2 mg total) by mouth daily as needed (blood sugar more than 200.). 90 tablet 1   ipratropium-albuterol  (DUONEB) 0.5-2.5 (3) MG/3ML SOLN Take 3 mLs by nebulization every 8 (eight) hours. 360 mL 1   isosorbide  mononitrate (IMDUR ) 30 MG 24 hr tablet Take 0.5 tablets (15 mg total) by mouth daily. 90 tablet 1   metFORMIN  (GLUCOPHAGE ) 1000 MG tablet Take 1 tablet (1,000 mg total) by mouth 2 (two) times daily with a meal. 200 tablet 2   Multiple Vitamins-Minerals (ADULT ONE DAILY GUMMIES PO) Take 2 tablets by mouth in the morning. Women's Multi by [employer]     nitroGLYCERIN  (NITROSTAT ) 0.4 MG SL tablet Place 1 tablet (0.4 mg total) under the tongue every 5 (five) minutes as needed for chest pain. 25 tablet 3   omeprazole  (PRILOSEC) 40 MG capsule Take 1 capsule (40 mg total) by mouth daily. 180 capsule 1   rosuvastatin  (CRESTOR ) 10 MG tablet Take 1 tablet by mouth once daily 90 tablet 3   No current facility-administered medications for this visit.    No Known Allergies  Review of Systems  Constitutional:  Positive for activity change and unexpected weight change (Weight loss with Jardiance ).  HENT:  Positive for trouble swallowing.   Respiratory:  Positive for apnea, cough and shortness of breath.    Cardiovascular:  Negative for chest pain.  Gastrointestinal:  Positive for abdominal pain (Reflux).  Musculoskeletal:        Leg cramps  Neurological:  Positive for dizziness and numbness. Negative for seizures and weakness.    BP 129/69   Pulse 84   Resp 20   Ht 5' 1 (1.549 m)   Wt 137 lb (62.1 kg)   SpO2 95% Comment: RA  BMI 25.89 kg/m  Physical Exam Vitals reviewed.  Constitutional:      General: She is not in acute distress.    Appearance: Normal appearance.  HENT:     Head: Normocephalic and atraumatic.  Eyes:     General: No scleral icterus.    Extraocular Movements: Extraocular movements intact.  Cardiovascular:     Rate and Rhythm: Normal rate and regular rhythm.     Heart sounds: Normal heart sounds. No murmur heard.    No friction rub. No gallop.  Pulmonary:     Effort: Pulmonary effort is normal.     Breath sounds: No wheezing.     Comments: Decreased breath sounds bilaterally Abdominal:     General: There is no distension.     Palpations: Abdomen is soft.  Lymphadenopathy:     Cervical: No cervical adenopathy.  Skin:    General: Skin is warm and dry.  Neurological:     General: No focal deficit present.     Mental Status: She is alert and oriented to person, place, and time.     Cranial Nerves: No cranial nerve deficit.     Motor: No weakness.    Diagnostic Tests: NUCLEAR MEDICINE PET SKULL BASE TO THIGH   TECHNIQUE: 7.4 mCi F-18 FDG was injected intravenously. Full-ring PET imaging was performed from the skull base to thigh after the radiotracer. CT data was obtained and used for attenuation correction and anatomic localization.   Fasting blood glucose: 165 mg/dl   COMPARISON:  5 PET-CT scan 01/16/2023   FINDINGS: NECK: No hypermetabolic lymph nodes in the neck.   Incidental  CT findings: None.   CHEST: The RIGHT upper lobe pulmonary nodule of concern is slightly enlarged compared to prior and has associated biopsy clip. Activity is  similar prior with SUV max equal 2.8 compared SUV max equal 2.5 on image 38. Nodule measures approximately 10 mm.   No hypermetabolic mediastinal lymph nodes.   Within the medial aspect of the LEFT lower lobe 10 mm nodule image 65/series 202) has associated metabolic activity SUV max equal 2.6. This nodule appears slightly increased in size from 6 mm on comparison exam RIGHT head faint radiotracer activity with SUV max equal 2.1.   Incidental CT findings: None.   ABDOMEN/PELVIS: No abnormal hypermetabolic activity within the liver, pancreas, adrenal glands, or spleen. No hypermetabolic lymph nodes in the abdomen or pelvis.   Incidental CT findings: None.   SKELETON: No focal hypermetabolic activity to suggest skeletal metastasis.   Incidental CT findings: None.   IMPRESSION: 1. Mild increase in size of RIGHT upper lobe hypermetabolic pulmonary nodule. 2. Slight increase in size of LEFT lower lobe pulmonary nodule with mild metabolic activity. 3. No evidence of metastatic adenopathy. 4. No evidence of distant metastatic disease.     Electronically Signed   By: Jackquline Boxer M.D.   On: 09/28/2023 11:29   I personally reviewed the CT and PET/CT images.  9 x 6 mm right upper lobe nodule with SUV of 2.8 and 10 mm left lower lobe nodule with an SUV of 2.6.  Centrilobular emphysema.  Coronary and aortic atherosclerosis.  Pulmonary function testing 09/16/2023 FVC 1.8 (73%) FEV1 0.97 (53%) FEV1 1.06 (58%) postbronchodilator RV 4.05 (193%) DLCO 11.63 (68%)  Impression: Erica Braun is a 73 year old woman with a history of heavy tobacco use, COPD, esophageal cancer, hypertension, hyperlipidemia, coronary artery disease, aortic atherosclerosis, sleep apnea, type 2 diabetes without complication, and reflux.  Adenocarcinoma right upper lobe-likely synchronous with her left lower lobe nodule.  Clinical stage is 1A (T1, N0).  Treatment options include resection and  stereotactic radiation.  Left lower lobe nodule-not sampled.  Almost certainly a second primary bronchogenic carcinoma.  Peripheral and amenable to sublobar resection.  Both of her lung nodules are relatively small and peripheral and amenable to sublobar resection.  Surgical approach would require sequential major operations.  It could be done but she would be relatively high risk given her COPD and other medical issues.  An alternative option would be stereotactic radiation.  This case was discussed at our multidisciplinary meeting and the consensus was that stereotactic radiation would be a better option for her due to bilateral disease.  She has a meeting with Dr. Dewey tomorrow.  After she meets with him if she wants to pursue surgery over radiation I will be happy to see her back and talk to her more  Plan: Consultation with Dr. Dewey tomorrow Patient will let us  know if she wants to further discuss surgery  Erica JAYSON Millers, MD [facility] [phone]

## 2023-09-29 NOTE — Progress Notes (Signed)
 Thoracic Location of Tumor / Histology: RUL Lung  Patient presented with shortness of breath and a dry cough.  PET 09/24/2023: RIGHT upper lobe pulmonary nodule of concern is slightly enlarged compared to prior and has associated biopsy clip.  Nodule measures approximately 10 mm.  MRI Brain 09/22/2023: No metastatic disease or acute intracranial abnormality. Normal for age MRI appearance of the Brain.  Biopsies of RUL Lung 09/08/2023    Past/Anticipated interventions by pulmonary, if any: [name] 09/16/2023 -We have discussed the results of the biopsy. We have discussed that the best option for treatment is for either surgery or radiation .  -Pt. States she does not want surgery.  -I will make a referral to radiation oncology , and will order PFT's in case she changes her mind about surgery once we have staging complete.      Past/Anticipated interventions by cardiothoracic surgery, if any:   Past/Anticipated interventions by medical oncology, if any:    Tobacco/Marijuana/Snuff/ETOH use: Former smoker, quit 2010  Signs/Symptoms Weight changes, if any:  Respiratory complaints, if any:  Hemoptysis, if any:  Pain issues, if any:    SAFETY ISSUES: Prior radiation?  Pacemaker/ICD?   Possible current pregnancy? Hysterectomy Is the patient on methotrexate?   Current Complaints / other details:

## 2023-09-30 ENCOUNTER — Encounter: Payer: Self-pay | Admitting: Radiation Oncology

## 2023-09-30 ENCOUNTER — Ambulatory Visit
Admission: RE | Admit: 2023-09-30 | Discharge: 2023-09-30 | Disposition: A | Discharge status: Home / Self Care | Source: Ambulatory Visit | Attending: Radiation Oncology | Admitting: Radiation Oncology

## 2023-09-30 ENCOUNTER — Other Ambulatory Visit: Payer: Self-pay | Admitting: Pulmonary Disease

## 2023-09-30 VITALS — BP 122/69 | HR 57 | Temp 96.0°F | Resp 18 | Ht 61.0 in | Wt 136.2 lb

## 2023-09-30 DIAGNOSIS — C3411 Malignant neoplasm of upper lobe, right bronchus or lung: Secondary | ICD-10-CM | POA: Insufficient documentation

## 2023-09-30 DIAGNOSIS — Z8501 Personal history of malignant neoplasm of esophagus: Secondary | ICD-10-CM | POA: Insufficient documentation

## 2023-09-30 DIAGNOSIS — Z87891 Personal history of nicotine dependence: Secondary | ICD-10-CM | POA: Diagnosis not present

## 2023-09-30 DIAGNOSIS — Z803 Family history of malignant neoplasm of breast: Secondary | ICD-10-CM | POA: Diagnosis not present

## 2023-09-30 DIAGNOSIS — R911 Solitary pulmonary nodule: Secondary | ICD-10-CM | POA: Diagnosis not present

## 2023-09-30 DIAGNOSIS — G4730 Sleep apnea, unspecified: Secondary | ICD-10-CM | POA: Insufficient documentation

## 2023-09-30 DIAGNOSIS — I2510 Atherosclerotic heart disease of native coronary artery without angina pectoris: Secondary | ICD-10-CM | POA: Diagnosis not present

## 2023-09-30 DIAGNOSIS — M47814 Spondylosis without myelopathy or radiculopathy, thoracic region: Secondary | ICD-10-CM | POA: Insufficient documentation

## 2023-09-30 DIAGNOSIS — C3432 Malignant neoplasm of lower lobe, left bronchus or lung: Secondary | ICD-10-CM | POA: Diagnosis not present

## 2023-09-30 DIAGNOSIS — M810 Age-related osteoporosis without current pathological fracture: Secondary | ICD-10-CM | POA: Insufficient documentation

## 2023-09-30 DIAGNOSIS — E785 Hyperlipidemia, unspecified: Secondary | ICD-10-CM | POA: Diagnosis not present

## 2023-09-30 DIAGNOSIS — Z79899 Other long term (current) drug therapy: Secondary | ICD-10-CM | POA: Diagnosis not present

## 2023-09-30 DIAGNOSIS — J432 Centrilobular emphysema: Secondary | ICD-10-CM | POA: Diagnosis not present

## 2023-09-30 DIAGNOSIS — Z801 Family history of malignant neoplasm of trachea, bronchus and lung: Secondary | ICD-10-CM | POA: Insufficient documentation

## 2023-09-30 DIAGNOSIS — I700 Atherosclerosis of aorta: Secondary | ICD-10-CM | POA: Diagnosis not present

## 2023-09-30 DIAGNOSIS — Z7984 Long term (current) use of oral hypoglycemic drugs: Secondary | ICD-10-CM | POA: Diagnosis not present

## 2023-09-30 DIAGNOSIS — I10 Essential (primary) hypertension: Secondary | ICD-10-CM | POA: Insufficient documentation

## 2023-09-30 DIAGNOSIS — J449 Chronic obstructive pulmonary disease, unspecified: Secondary | ICD-10-CM | POA: Insufficient documentation

## 2023-09-30 DIAGNOSIS — M8580 Other specified disorders of bone density and structure, unspecified site: Secondary | ICD-10-CM | POA: Diagnosis not present

## 2023-09-30 DIAGNOSIS — E119 Type 2 diabetes mellitus without complications: Secondary | ICD-10-CM | POA: Insufficient documentation

## 2023-09-30 NOTE — Progress Notes (Signed)
 Radiation Oncology         [phone]) [phone] ________________________________  Name: Erica Braun        MRN: [id number]  Date of Service: 09/30/2023 DOB: Sep 02, 1950  RR:Yjtxd, Bari LABOR, [name]  Shelah Lamar RAMAN, MD     REFERRING PHYSICIAN: Shelah Lamar RAMAN, MD   DIAGNOSIS: The primary encounter diagnosis was Malignant neoplasm of upper lobe of right lung (HCC). A diagnosis of Malignant neoplasm of lower lobe of left lung (HCC) was also pertinent to this visit.   HISTORY OF PRESENT ILLNESS: Erica Braun is a 73 y.o. female seen at the request of Dr. Ruther for a newly diagnosed lung cancer.  The patient has a history of heavy tobacco use, and early stage esophageal cancer, diabetes, hypertension and cardiovascular disease.  She was being followed by the lung cancer screening program given her smoking history and in September 2024 was found to have new nodules in the right upper lobe and left lower lobe. At that time the area of concern in mL LLL was 6.3 mm and in the RUL was 7 mm.  This was followed with repeat imaging on 12/25/2022, the LLL measured 9.5 mm, and the RUL was documented as RML measuring 6.5 mm.  A PET scan was recommended and performed on 01/16/2023, the LLL nodule measuring 6 mm with an SUV of 2, clustered RUL nodularity measured 8 mm with an SUV of 2.5, mediastinal blood pool was 2.3 so she was followed closely repeat CT on 04/22/2023 showed that the nodule in the RUL measured 9 mm and the LLL nodule measured 6 mm unchanged but again had measured larger in the past.  There was a RML nodule measuring 8 mm that was stable.  She underwent bronchoscopy on 09/08/2023 and cytology from the procedure showed squamous cell carcinoma in the RUL fine-needle aspirate, a polypoid lesion in the distal trachea was noted and benign in the brushing specimen.  Additional biopsy of the polypoid lesion in the distal trachea was benign mucosa with hyalin cartilage showing degenerative change  negative for malignancy. An MRI brain on 09/22/23 did not show evidence of metastatic disease. A PET on 09/24/23 showed persistence of the RUL nodule with an SUV of 2.8, and measured 10 mm. The LLL nodule measured 10 mm and had an SUV of 2.6. No adenopathy was present, and the RML was not noted to be hypermetabolic.  She met with Dr. Kerrin to discuss surgical resection who felt that both of her nodules were relatively small and could be amenable to sublobar resection in a sequential fashion.  She is seen to consider SBRT as an alternative option to consider.    PREVIOUS RADIATION THERAPY: No   PAST MEDICAL HISTORY:  Past Medical History:  Diagnosis Date   Complication of anesthesia    COPD (chronic obstructive pulmonary disease) (HCC)    Coronary artery disease    Diabetes mellitus without complication (HCC)    Diverticulosis    Early cataracts, bilateral 11/21/2016   Esophageal cancer (HCC)    GERD (gastroesophageal reflux disease)    Glaucoma    patient denies   Hyperlipidemia    Hypertension    Internal hemorrhoids    Osteoporosis 12/16/2021   Pneumonia    PONV (postoperative nausea and vomiting)    Sleep apnea    uses Cpap   Spasm of the cricopharyngeus muscle    Squamous cell esophageal cancer (HCC) 05/10/2018       PAST SURGICAL HISTORY: Past  Surgical History:  Procedure Laterality Date   ABDOMINAL HYSTERECTOMY     cervical dysplasia   BIOPSY  05/03/2018   Procedure: BIOPSY;  Surgeon: Wilhelmenia Aloha Raddle., MD;  Location: [facility] ENDOSCOPY;  Service: Gastroenterology;;   BREAST BIOPSY Left    BREAST BIOPSY Right 11/27/2021   MM RT BREAST BX W LOC DEV 1ST LESION IMAGE BX SPEC STEREO GUIDE 11/27/2021 [facility]   BREAST BIOPSY  01/14/2022   MM RT RADIOACTIVE SEED LOC MAMMO GUIDE 01/14/2022 [facility]   BREAST LUMPECTOMY Left    BREAST LUMPECTOMY WITH RADIOACTIVE SEED LOCALIZATION Right 01/15/2022   Procedure: RIGHT BREAST LUMPECTOMY WITH RADIOACTIVE SEED  LOCALIZATION;  Surgeon: [name] Ned, MD;  Location: [facility] OR;  Service: General;  Laterality: Right;   ESOPHAGOGASTRODUODENOSCOPY (EGD) WITH PROPOFOL  N/A 05/03/2018   Procedure: ESOPHAGOGASTRODUODENOSCOPY (EGD) WITH PROPOFOL ;  Surgeon: Wilhelmenia Aloha Raddle., MD;  Location[facility] ENDOSCOPY;  Service: Gastroenterology;  Laterality: N/A;   ESOPHAGOGASTRODUODENOSCOPY (EGD) WITH PROPOFOL  N/A 11/18/2018   Procedure: ESOPHAGOGASTRODUODENOSCOPY (EGD) WITH PROPOFOL ;  Surgeon: Abran Norleen SAILOR, MD;  Location: [facility] ENDOSCOPY;  Service: Endoscopy;  Laterality: N/A;   EUS  05/03/2018   Procedure: UPPER ENDOSCOPIC ULTRASOUND (EUS) RADIAL;  Surgeon: Wilhelmenia Aloha Raddle., MD;  Location: [facility] ENDOSCOPY;  Service: Gastroenterology;;   FOREIGN BODY REMOVAL  11/18/2018   Procedure: FOREIGN BODY REMOVAL;  Surgeon: Abran Norleen SAILOR, MD;  Location: [facility] ENDOSCOPY;  Service: Endoscopy;;   LEFT HEART CATH AND CORONARY ANGIOGRAPHY N/A 06/11/2017   Procedure: LEFT HEART CATH AND CORONARY ANGIOGRAPHY;  Surgeon: [name] Victory ORN, MD;  Location: [facility] INVASIVE CV LAB;  Service: Cardiovascular;  Laterality: N/A;   VIDEO BRONCHOSCOPY WITH ENDOBRONCHIAL NAVIGATION Right 09/08/2023   Procedure: VIDEO BRONCHOSCOPY WITH ENDOBRONCHIAL NAVIGATION;  Surgeon: Shelah Lamar RAMAN, MD;  Location: [facility] ENDOSCOPY;  Service: Pulmonary;  Laterality: Right;     FAMILY HISTORY:  Family History  Problem Relation Age of Onset   Breast cancer Mother        dx 72s; mets   Arthritis Mother    COPD Father 16       emphysema   Alcohol abuse Father    Diabetes Father    Breast cancer Sister        dx late 38s; mat half sister   Early death Brother        overdose   Drug abuse Brother    Alcohol abuse Paternal Aunt    COPD Paternal Aunt    Throat cancer Paternal Uncle    Liver cancer Maternal Grandmother        or other primary; d. late 44s   Diabetes Maternal Grandmother    Alcohol abuse Maternal Grandfather    Early death Maternal Grandfather    Stroke  Paternal Grandmother    Heart disease Paternal Grandfather    Esophageal cancer Neg Hx    Stomach cancer Neg Hx    Colon cancer Neg Hx      SOCIAL HISTORY:  reports that she quit smoking about 15 years ago. Her smoking use included cigarettes. She started smoking about 56 years ago. She has a 61.5 pack-year smoking history. She has been exposed to tobacco smoke. She has never used smokeless tobacco. She reports that she does not currently use alcohol. She reports that she does not use drugs. The patient is married and accompanied by her husband. They live in [place].    ALLERGIES: Patient has no known allergies.   MEDICATIONS:  Current Outpatient Medications  Medication Sig Dispense  Refill   acetaminophen  (TYLENOL ) 500 MG tablet Take 500 mg by mouth every 6 (six) hours as needed for mild pain (pain score 1-3).     atenolol  (TENORMIN ) 25 MG tablet Take 0.5 tablets (12.5 mg total) by mouth 2 (two) times daily.     calcium  carbonate (OSCAL) 1500 (600 Ca) MG TABS tablet Take 600 mg by mouth in the morning and at bedtime.     empagliflozin  (JARDIANCE ) 25 MG TABS tablet Take 1 tablet (25 mg total) by mouth daily before breakfast. 90 tablet 2   famotidine  (PEPCID ) 40 MG tablet Take 1 tablet (40 mg total) by mouth at bedtime. 90 tablet 3   ferrous sulfate  325 (65 FE) MG EC tablet Take 1 tablet (325 mg total) by mouth daily with breakfast. 90 tablet 1   glimepiride  (AMARYL ) 2 MG tablet Take 1 tablet (2 mg total) by mouth daily as needed (blood sugar more than 200.). 90 tablet 1   ipratropium-albuterol  (DUONEB) 0.5-2.5 (3) MG/3ML SOLN Take 3 mLs by nebulization every 8 (eight) hours. 360 mL 1   isosorbide  mononitrate (IMDUR ) 30 MG 24 hr tablet Take 0.5 tablets (15 mg total) by mouth daily. 90 tablet 1   metFORMIN  (GLUCOPHAGE ) 1000 MG tablet Take 1 tablet (1,000 mg total) by mouth 2 (two) times daily with a meal. 200 tablet 2   Multiple Vitamins-Minerals (ADULT ONE DAILY GUMMIES PO) Take 2 tablets by  mouth in the morning. Women's Multi by [employer]     nitroGLYCERIN  (NITROSTAT ) 0.4 MG SL tablet Place 1 tablet (0.4 mg total) under the tongue every 5 (five) minutes as needed for chest pain. 25 tablet 3   omeprazole  (PRILOSEC) 40 MG capsule Take 1 capsule (40 mg total) by mouth daily. 180 capsule 1   rosuvastatin  (CRESTOR ) 10 MG tablet Take 1 tablet by mouth once daily 90 tablet 3   ANORO ELLIPTA  62.5-25 MCG/ACT AEPB Inhale 1 puff by mouth once daily 60 each 3   No current facility-administered medications for this encounter.     REVIEW OF SYSTEMS: On review of systems, the patient reports that she is doing well. She is not having  any unintended weight loss. Sh had shortness of breath with exertion, and she has a dry non productive cough. She has occasional acid reflux and uses Omeprazole  and Alka Seltzer as needed. She describes shortness of breath with exertion without progressive change. No other complaints are verbalized.   PHYSICAL EXAM:  Wt Readings from Last 3 Encounters:  09/30/23 136 lb 4 oz (61.8 kg)  09/29/23 137 lb (62.1 kg)  09/16/23 137 lb 9.6 oz (62.4 kg)   Temp Readings from Last 3 Encounters:  09/30/23 (!) 96 F (35.6 C) (Temporal)  09/16/23 97.6 F (36.4 C) (Oral)  09/08/23 98.7 F (37.1 C)   BP Readings from Last 3 Encounters:  09/30/23 122/69  09/29/23 129/69  09/16/23 137/76   Pulse Readings from Last 3 Encounters:  09/30/23 (!) 57  09/29/23 84  09/16/23 70   Pain Assessment Pain Score: 0-No pain/10  In general this is a well appearing caucasian female in no acute distress. She's alert and oriented x4 and appropriate throughout the examination. Cardiopulmonary assessment is negative for acute distress and she exhibits normal effort.     ECOG = 1  0 - Asymptomatic (Fully active, able to carry on all predisease activities without restriction)  1 - Symptomatic but completely ambulatory (Restricted in physically strenuous activity but ambulatory and  able to carry out  work of a light or sedentary nature. For example, light housework, office work)  2 - Symptomatic, <50% in bed during the day (Ambulatory and capable of all self care but unable to carry out any work activities. Up and about more than 50% of waking hours)  3 - Symptomatic, >50% in bed, but not bedbound (Capable of only limited self-care, confined to bed or chair 50% or more of waking hours)  4 - Bedbound (Completely disabled. Cannot carry on any self-care. Totally confined to bed or chair)  5 - Death   Raylene MM, Creech RH, Tormey DC, et al. [phone]). Toxicity and response criteria of the [facility]. Am. DOROTHA Bridges. Oncol. 5 (6): 649-55    LABORATORY DATA:  Lab Results  Component Value Date   WBC 4.6 08/25/2023   HGB 10.1 (L) 08/25/2023   HCT 33.3 (L) 08/25/2023   MCV 85 08/25/2023   PLT 165 08/25/2023   Lab Results  Component Value Date   NA 137 08/25/2023   K 4.4 08/25/2023   CL 98 08/25/2023   CO2 24 08/25/2023   Lab Results  Component Value Date   ALT 9 08/25/2023   AST 12 08/25/2023   ALKPHOS 108 08/25/2023   BILITOT 0.3 08/25/2023      RADIOGRAPHY: NM PET Image Restage (PS) Skull Base to Thigh (F-18 FDG) Result Date: 09/28/2023 CLINICAL DATA:  Initial treatment strategy for pulmonary nodule. EXAM: NUCLEAR MEDICINE PET SKULL BASE TO THIGH TECHNIQUE: 7.4 mCi F-18 FDG was injected intravenously. Full-ring PET imaging was performed from the skull base to thigh after the radiotracer. CT data was obtained and used for attenuation correction and anatomic localization. Fasting blood glucose: 165 mg/dl COMPARISON:  5 PET-CT scan 01/16/2023 FINDINGS: NECK: No hypermetabolic lymph nodes in the neck. Incidental CT findings: None. CHEST: The RIGHT upper lobe pulmonary nodule of concern is slightly enlarged compared to prior and has associated biopsy clip. Activity is similar prior with SUV max equal 2.8 compared SUV max equal 2.5 on image 38.  Nodule measures approximately 10 mm. No hypermetabolic mediastinal lymph nodes. Within the medial aspect of the LEFT lower lobe 10 mm nodule image 65/series 202) has associated metabolic activity SUV max equal 2.6. This nodule appears slightly increased in size from 6 mm on comparison exam RIGHT head faint radiotracer activity with SUV max equal 2.1. Incidental CT findings: None. ABDOMEN/PELVIS: No abnormal hypermetabolic activity within the liver, pancreas, adrenal glands, or spleen. No hypermetabolic lymph nodes in the abdomen or pelvis. Incidental CT findings: None. SKELETON: No focal hypermetabolic activity to suggest skeletal metastasis. Incidental CT findings: None. IMPRESSION: 1. Mild increase in size of RIGHT upper lobe hypermetabolic pulmonary nodule. 2. Slight increase in size of LEFT lower lobe pulmonary nodule with mild metabolic activity. 3. No evidence of metastatic adenopathy. 4. No evidence of distant metastatic disease. Electronically Signed   By: Jackquline Boxer M.D.   On: 09/28/2023 11:29   MR BRAIN W WO CONTRAST Result Date: 09/27/2023 CLINICAL DATA:  73 year old female with non-small cell lung cancer. Staging. EXAM: MRI HEAD WITHOUT AND WITH CONTRAST TECHNIQUE: Multiplanar, multiecho pulse sequences of the brain and surrounding structures were obtained without and with intravenous contrast. CONTRAST:  6mL GADAVIST  GADOBUTROL  1 MMOL/ML IV SOLN COMPARISON:  None Available. FINDINGS: Brain: Normal cerebral volume for age. No midline shift, mass effect, or evidence of intracranial mass lesion. No abnormal enhancement identified. No dural thickening identified. No restricted diffusion to suggest acute infarction. No ventriculomegaly, extra-axial collection  or acute intracranial hemorrhage. Cervicomedullary junction and pituitary are within normal limits. And normal for age gray and white matter signal throughout the brain. No cortical encephalomalacia or chronic cerebral blood products  identified. Vascular: Major intracranial vascular flow voids are preserved. Following contrast the major dural venous sinuses are enhancing and appear to be patent. Skull and upper cervical spine: Visualized bone marrow signal is within normal limits. Negative visible cervical spine aside from chronic disc and endplate degeneration. Sinuses/Orbits: Negative orbits. Paranasal sinuses and mastoids are well aerated. Other: Visible internal auditory structures appear normal. Negative visible scalp and face. IMPRESSION: No metastatic disease or acute intracranial abnormality. Normal for age MRI appearance of the Brain. Electronically Signed   By: VEAR Hurst M.D.   On: 09/27/2023 09:34   CT SUPER D CHEST WO CONTRAST Result Date: 09/14/2023 CLINICAL DATA:  Follow-up of enlarging right upper lobe pulmonary nodule. Breast cancer last year. * Tracking Code: BO * EXAM: CT CHEST WITHOUT CONTRAST TECHNIQUE: Multidetector CT imaging of the chest was performed using thin slice collimation for electromagnetic bronchoscopy planning purposes, without intravenous contrast. RADIATION DOSE REDUCTION: This exam was performed according to the departmental dose-optimization program which includes automated exposure control, adjustment of the mA and/or kV according to patient size and/or use of iterative reconstruction technique. COMPARISON:  04/22/2023 FINDINGS: Cardiovascular: Aortic atherosclerosis. Normal heart size, without pericardial effusion. Three vessel coronary artery calcification. Mediastinum/Nodes: No supraclavicular adenopathy. Surgical absence versus atrophy/agenesis of the left lobe of the thyroid . No mediastinal or hilar adenopathy, given limitations of unenhanced CT. Lungs/Pleura: No pleural fluid.  Moderate centrilobular emphysema. Irregular right upper lobe pulmonary nodule measures 9 x 6 mm (similar in transverse dimension) on 37/4. Based on sagittal reformats, 9 mm craniocaudal on image 47 today versus 6 mm on image 52  sagittal on the prior. Scattered calcified granulomas. Right middle lobe, less so lingular volume loss and scarring are unchanged. Medial left lower lobe pulmonary nodule measures 8 x 6 mm on 111/4 versus 6 x 3 mm on the prior. Upper Abdomen: Normal imaged portions of the liver, stomach, adrenal glands, left kidney. Parenchymal calcifications versus collecting system stone of 4 mm in the upper pole right kidney. Fatty replaced but incompletely imaged pancreas. Old granulomatous disease in the spleen. Musculoskeletal: Osteopenia.  Cervical ribs.  Thoracic spondylosis. IMPRESSION: 1. Mild enlargement (primarily in craniocaudal span) of a 9 mm right upper lobe pulmonary nodule. This is suspicious for primary bronchogenic carcinoma, given morphology and upper lobe location. 2. Mild enlargement of an 8 mm left lower lobe pulmonary nodule, favoring synchronous primary bronchogenic carcinoma versus less likely metastasis. 3. No thoracic adenopathy. 4. Incidental findings, including: Aortic atherosclerosis (ICD10-I70.0), coronary artery atherosclerosis and emphysema (ICD10-J43.9). Electronically Signed   By: Rockey Kilts M.D.   On: 09/14/2023 11:29   DG Chest Port 1 View Result Date: 09/08/2023 CLINICAL DATA:  Status post bronchoscopy with biopsy EXAM: PORTABLE CHEST 1 VIEW COMPARISON:  September 03, 2023 CT in prior studies FINDINGS: Right upper lobe pulmonary nodule with adjacent metallic clip identified. Chronic interstitial findings and additional pulmonary nodules, better seen on prior chest CT. No focal consolidation. No pleural effusions. No pneumothorax. Unchanged cardiomediastinal silhouette. IMPRESSION: Right upper lobe pulmonary nodule with adjacent metallic clip in place. No pneumothorax. Electronically Signed   By: Michaeline Blanch M.D.   On: 09/08/2023 12:00   DG C-ARM BRONCHOSCOPY Result Date: 09/08/2023 C-ARM BRONCHOSCOPY: Fluoroscopy was utilized by the requesting physician.  No radiographic interpretation.  IMPRESSION/PLAN: 1. Stage IA1, cT1aN0M0, NSCLC, squamous cell carcinoma of the RUL with Putative Stage IA1, cT1aN0M0, NSCLC of the LLL. Dr. Dewey discusses the pathology findings and reviews the nature of early stage lung cancer. He discusses that the lesion in the LLL appears similar and suspicious in nature as well.  Dr. Dewey reviews that the standard of care is for surgical resection. However for patients who are not medical candidates to undergo surgery, or who choose to forgo surgery, stereotactic body radiotherapy (SBRT) is an appropriate alternative. After considering her options, the patient verbalizes interest in SBRT treatment rather than surgical intervention. We discussed the risks, benefits, short, and long term effects of radiotherapy, as well as the curative intent, and the patient is interested in proceeding. Dr. Dewey discusses the delivery and logistics of radiotherapy and anticipates a course of 3-5 fraction to treat the RUL and LLL. Written consent is obtained and placed in the chart, a copy was provided to the patient. The patient will be contacted to coordinate treatment planning by our simulation department.    2. Subpleural nodule in the RML. This will be followed closely while she begins surveillance.   In a visit lasting 60 minutes, greater than 50% of the time was spent face to face discussing the patient's condition, in preparation for the discussion, and coordinating the patient's care.   The above documentation reflects my direct findings during this shared patient visit. Please see the separate note by Dr. Dewey on this date for the remainder of the patient's plan of care.    Donald KYM Husband, [facility]   **Disclaimer: This note was dictated with voice recognition software. Similar sounding words can inadvertently be transcribed and this note may contain transcription errors which may not have been corrected upon publication of note.**

## 2023-10-01 DIAGNOSIS — C3432 Malignant neoplasm of lower lobe, left bronchus or lung: Secondary | ICD-10-CM | POA: Diagnosis not present

## 2023-10-01 DIAGNOSIS — C3411 Malignant neoplasm of upper lobe, right bronchus or lung: Secondary | ICD-10-CM | POA: Diagnosis not present

## 2023-10-01 DIAGNOSIS — Z87891 Personal history of nicotine dependence: Secondary | ICD-10-CM | POA: Diagnosis not present

## 2023-10-08 ENCOUNTER — Ambulatory Visit
Admission: RE | Admit: 2023-10-08 | Discharge: 2023-10-08 | Disposition: A | Discharge status: Home / Self Care | Source: Ambulatory Visit | Attending: Radiation Oncology | Admitting: Radiation Oncology

## 2023-10-08 DIAGNOSIS — C3411 Malignant neoplasm of upper lobe, right bronchus or lung: Secondary | ICD-10-CM | POA: Diagnosis present

## 2023-10-08 DIAGNOSIS — Z510 Encounter for antineoplastic radiation therapy: Secondary | ICD-10-CM | POA: Insufficient documentation

## 2023-10-08 DIAGNOSIS — C3432 Malignant neoplasm of lower lobe, left bronchus or lung: Secondary | ICD-10-CM | POA: Insufficient documentation

## 2023-10-08 DIAGNOSIS — Z87891 Personal history of nicotine dependence: Secondary | ICD-10-CM | POA: Diagnosis not present

## 2023-10-20 ENCOUNTER — Other Ambulatory Visit: Payer: Self-pay

## 2023-10-20 ENCOUNTER — Ambulatory Visit
Admission: RE | Admit: 2023-10-20 | Discharge: 2023-10-20 | Disposition: A | Discharge status: Home / Self Care | Source: Ambulatory Visit | Attending: Radiation Oncology | Admitting: Radiation Oncology

## 2023-10-20 DIAGNOSIS — C3432 Malignant neoplasm of lower lobe, left bronchus or lung: Secondary | ICD-10-CM | POA: Diagnosis not present

## 2023-10-20 DIAGNOSIS — C3411 Malignant neoplasm of upper lobe, right bronchus or lung: Secondary | ICD-10-CM | POA: Diagnosis not present

## 2023-10-20 DIAGNOSIS — Z510 Encounter for antineoplastic radiation therapy: Secondary | ICD-10-CM | POA: Diagnosis not present

## 2023-10-20 DIAGNOSIS — Z87891 Personal history of nicotine dependence: Secondary | ICD-10-CM | POA: Diagnosis not present

## 2023-10-20 LAB — RAD ONC ARIA SESSION SUMMARY
Course Elapsed Days: 0
Plan Fractions Treated to Date: 1
Plan Fractions Treated to Date: 1
Plan Prescribed Dose Per Fraction: 12 Gy
Plan Prescribed Dose Per Fraction: 7.5 Gy
Plan Total Fractions Prescribed: 5
Plan Total Fractions Prescribed: 8
Plan Total Prescribed Dose: 60 Gy
Plan Total Prescribed Dose: 60 Gy
Reference Point Dosage Given to Date: 12 Gy
Reference Point Dosage Given to Date: 7.5 Gy
Reference Point Session Dosage Given: 12 Gy
Reference Point Session Dosage Given: 7.5 Gy
Session Number: 1

## 2023-10-21 ENCOUNTER — Ambulatory Visit: Admission: RE | Admit: 2023-10-21 | Discharge: 2023-10-21 | Attending: Radiation Oncology

## 2023-10-21 ENCOUNTER — Other Ambulatory Visit: Payer: Self-pay

## 2023-10-21 ENCOUNTER — Encounter (INDEPENDENT_AMBULATORY_CARE_PROVIDER_SITE_OTHER): Payer: Self-pay | Admitting: Gastroenterology

## 2023-10-21 DIAGNOSIS — Z87891 Personal history of nicotine dependence: Secondary | ICD-10-CM | POA: Diagnosis not present

## 2023-10-21 DIAGNOSIS — C3432 Malignant neoplasm of lower lobe, left bronchus or lung: Secondary | ICD-10-CM | POA: Diagnosis not present

## 2023-10-21 DIAGNOSIS — C3411 Malignant neoplasm of upper lobe, right bronchus or lung: Secondary | ICD-10-CM | POA: Diagnosis not present

## 2023-10-21 DIAGNOSIS — Z510 Encounter for antineoplastic radiation therapy: Secondary | ICD-10-CM | POA: Diagnosis not present

## 2023-10-21 LAB — RAD ONC ARIA SESSION SUMMARY
Course Elapsed Days: 1
Plan Fractions Treated to Date: 2
Plan Prescribed Dose Per Fraction: 7.5 Gy
Plan Total Fractions Prescribed: 8
Plan Total Prescribed Dose: 60 Gy
Reference Point Dosage Given to Date: 15 Gy
Reference Point Session Dosage Given: 7.5 Gy
Session Number: 2

## 2023-10-22 ENCOUNTER — Ambulatory Visit

## 2023-10-22 ENCOUNTER — Other Ambulatory Visit: Payer: Self-pay

## 2023-10-22 ENCOUNTER — Ambulatory Visit
Admission: RE | Admit: 2023-10-22 | Discharge: 2023-10-22 | Disposition: A | Discharge status: Home / Self Care | Source: Ambulatory Visit | Attending: Radiation Oncology | Admitting: Radiation Oncology

## 2023-10-22 DIAGNOSIS — C3432 Malignant neoplasm of lower lobe, left bronchus or lung: Secondary | ICD-10-CM | POA: Diagnosis not present

## 2023-10-22 DIAGNOSIS — Z87891 Personal history of nicotine dependence: Secondary | ICD-10-CM | POA: Diagnosis not present

## 2023-10-22 DIAGNOSIS — C3411 Malignant neoplasm of upper lobe, right bronchus or lung: Secondary | ICD-10-CM | POA: Diagnosis not present

## 2023-10-22 DIAGNOSIS — Z510 Encounter for antineoplastic radiation therapy: Secondary | ICD-10-CM | POA: Diagnosis not present

## 2023-10-22 LAB — RAD ONC ARIA SESSION SUMMARY
Course Elapsed Days: 2
Plan Fractions Treated to Date: 2
Plan Fractions Treated to Date: 3
Plan Prescribed Dose Per Fraction: 12 Gy
Plan Prescribed Dose Per Fraction: 7.5 Gy
Plan Total Fractions Prescribed: 5
Plan Total Fractions Prescribed: 8
Plan Total Prescribed Dose: 60 Gy
Plan Total Prescribed Dose: 60 Gy
Reference Point Dosage Given to Date: 22.5 Gy
Reference Point Dosage Given to Date: 24 Gy
Reference Point Session Dosage Given: 12 Gy
Reference Point Session Dosage Given: 7.5 Gy
Session Number: 3

## 2023-10-23 ENCOUNTER — Ambulatory Visit
Admission: RE | Admit: 2023-10-23 | Discharge: 2023-10-23 | Disposition: A | Discharge status: Home / Self Care | Source: Ambulatory Visit | Attending: Radiation Oncology | Admitting: Radiation Oncology

## 2023-10-23 ENCOUNTER — Other Ambulatory Visit: Payer: Self-pay

## 2023-10-23 DIAGNOSIS — C3432 Malignant neoplasm of lower lobe, left bronchus or lung: Secondary | ICD-10-CM | POA: Diagnosis not present

## 2023-10-23 DIAGNOSIS — Z87891 Personal history of nicotine dependence: Secondary | ICD-10-CM | POA: Diagnosis not present

## 2023-10-23 DIAGNOSIS — Z510 Encounter for antineoplastic radiation therapy: Secondary | ICD-10-CM | POA: Diagnosis not present

## 2023-10-23 DIAGNOSIS — C3411 Malignant neoplasm of upper lobe, right bronchus or lung: Secondary | ICD-10-CM | POA: Diagnosis not present

## 2023-10-23 LAB — RAD ONC ARIA SESSION SUMMARY
Course Elapsed Days: 3
Plan Fractions Treated to Date: 4
Plan Prescribed Dose Per Fraction: 7.5 Gy
Plan Total Fractions Prescribed: 8
Plan Total Prescribed Dose: 60 Gy
Reference Point Dosage Given to Date: 30 Gy
Reference Point Session Dosage Given: 7.5 Gy
Session Number: 4

## 2023-10-26 ENCOUNTER — Ambulatory Visit

## 2023-10-26 ENCOUNTER — Other Ambulatory Visit: Payer: Self-pay

## 2023-10-26 ENCOUNTER — Ambulatory Visit
Admission: RE | Admit: 2023-10-26 | Discharge: 2023-10-26 | Disposition: A | Discharge status: Home / Self Care | Source: Ambulatory Visit | Attending: Radiation Oncology | Admitting: Radiation Oncology

## 2023-10-26 DIAGNOSIS — C3432 Malignant neoplasm of lower lobe, left bronchus or lung: Secondary | ICD-10-CM | POA: Diagnosis not present

## 2023-10-26 DIAGNOSIS — Z87891 Personal history of nicotine dependence: Secondary | ICD-10-CM | POA: Diagnosis not present

## 2023-10-26 DIAGNOSIS — Z510 Encounter for antineoplastic radiation therapy: Secondary | ICD-10-CM | POA: Diagnosis not present

## 2023-10-26 DIAGNOSIS — C3411 Malignant neoplasm of upper lobe, right bronchus or lung: Secondary | ICD-10-CM | POA: Diagnosis not present

## 2023-10-26 LAB — RAD ONC ARIA SESSION SUMMARY
Course Elapsed Days: 6
Plan Fractions Treated to Date: 3
Plan Fractions Treated to Date: 5
Plan Prescribed Dose Per Fraction: 12 Gy
Plan Prescribed Dose Per Fraction: 7.5 Gy
Plan Total Fractions Prescribed: 5
Plan Total Fractions Prescribed: 8
Plan Total Prescribed Dose: 60 Gy
Plan Total Prescribed Dose: 60 Gy
Reference Point Dosage Given to Date: 36 Gy
Reference Point Dosage Given to Date: 37.5 Gy
Reference Point Session Dosage Given: 12 Gy
Reference Point Session Dosage Given: 7.5 Gy
Session Number: 5

## 2023-10-27 ENCOUNTER — Ambulatory Visit: Admitting: Radiation Oncology

## 2023-10-27 ENCOUNTER — Ambulatory Visit
Admission: RE | Admit: 2023-10-27 | Discharge: 2023-10-27 | Disposition: A | Discharge status: Home / Self Care | Source: Ambulatory Visit | Attending: Radiation Oncology | Admitting: Radiation Oncology

## 2023-10-27 ENCOUNTER — Encounter: Payer: Self-pay | Admitting: *Deleted

## 2023-10-27 ENCOUNTER — Other Ambulatory Visit: Payer: Self-pay

## 2023-10-27 DIAGNOSIS — C3411 Malignant neoplasm of upper lobe, right bronchus or lung: Secondary | ICD-10-CM | POA: Diagnosis not present

## 2023-10-27 DIAGNOSIS — Z87891 Personal history of nicotine dependence: Secondary | ICD-10-CM | POA: Diagnosis not present

## 2023-10-27 DIAGNOSIS — C3432 Malignant neoplasm of lower lobe, left bronchus or lung: Secondary | ICD-10-CM | POA: Diagnosis not present

## 2023-10-27 DIAGNOSIS — Z510 Encounter for antineoplastic radiation therapy: Secondary | ICD-10-CM | POA: Diagnosis not present

## 2023-10-27 LAB — RAD ONC ARIA SESSION SUMMARY
Course Elapsed Days: 7
Plan Fractions Treated to Date: 6
Plan Prescribed Dose Per Fraction: 7.5 Gy
Plan Total Fractions Prescribed: 8
Plan Total Prescribed Dose: 60 Gy
Reference Point Dosage Given to Date: 45 Gy
Reference Point Session Dosage Given: 7.5 Gy
Session Number: 6

## 2023-10-28 ENCOUNTER — Other Ambulatory Visit: Payer: Self-pay

## 2023-10-28 ENCOUNTER — Ambulatory Visit
Admission: RE | Admit: 2023-10-28 | Discharge: 2023-10-28 | Disposition: A | Discharge status: Home / Self Care | Source: Ambulatory Visit | Attending: Radiation Oncology | Admitting: Radiation Oncology

## 2023-10-28 DIAGNOSIS — Z510 Encounter for antineoplastic radiation therapy: Secondary | ICD-10-CM | POA: Diagnosis not present

## 2023-10-28 DIAGNOSIS — C3411 Malignant neoplasm of upper lobe, right bronchus or lung: Secondary | ICD-10-CM | POA: Diagnosis not present

## 2023-10-28 DIAGNOSIS — C3432 Malignant neoplasm of lower lobe, left bronchus or lung: Secondary | ICD-10-CM | POA: Diagnosis not present

## 2023-10-28 DIAGNOSIS — Z87891 Personal history of nicotine dependence: Secondary | ICD-10-CM | POA: Diagnosis not present

## 2023-10-28 LAB — RAD ONC ARIA SESSION SUMMARY
Course Elapsed Days: 8
Plan Fractions Treated to Date: 4
Plan Fractions Treated to Date: 7
Plan Prescribed Dose Per Fraction: 12 Gy
Plan Prescribed Dose Per Fraction: 7.5 Gy
Plan Total Fractions Prescribed: 5
Plan Total Fractions Prescribed: 8
Plan Total Prescribed Dose: 60 Gy
Plan Total Prescribed Dose: 60 Gy
Reference Point Dosage Given to Date: 48 Gy
Reference Point Dosage Given to Date: 52.5 Gy
Reference Point Session Dosage Given: 12 Gy
Reference Point Session Dosage Given: 7.5 Gy
Session Number: 7

## 2023-10-29 ENCOUNTER — Ambulatory Visit
Admission: RE | Admit: 2023-10-29 | Discharge: 2023-10-29 | Disposition: A | Discharge status: Home / Self Care | Source: Ambulatory Visit | Attending: Radiation Oncology | Admitting: Radiation Oncology

## 2023-10-29 ENCOUNTER — Other Ambulatory Visit: Payer: Self-pay

## 2023-10-29 ENCOUNTER — Ambulatory Visit: Admitting: Radiation Oncology

## 2023-10-29 DIAGNOSIS — C3432 Malignant neoplasm of lower lobe, left bronchus or lung: Secondary | ICD-10-CM | POA: Diagnosis not present

## 2023-10-29 DIAGNOSIS — Z87891 Personal history of nicotine dependence: Secondary | ICD-10-CM | POA: Diagnosis not present

## 2023-10-29 DIAGNOSIS — C3411 Malignant neoplasm of upper lobe, right bronchus or lung: Secondary | ICD-10-CM | POA: Diagnosis not present

## 2023-10-29 DIAGNOSIS — Z510 Encounter for antineoplastic radiation therapy: Secondary | ICD-10-CM | POA: Diagnosis not present

## 2023-10-29 LAB — RAD ONC ARIA SESSION SUMMARY
Course Elapsed Days: 9
Plan Fractions Treated to Date: 8
Plan Prescribed Dose Per Fraction: 7.5 Gy
Plan Total Fractions Prescribed: 8
Plan Total Prescribed Dose: 60 Gy
Reference Point Dosage Given to Date: 60 Gy
Reference Point Session Dosage Given: 7.5 Gy
Session Number: 8

## 2023-10-30 ENCOUNTER — Ambulatory Visit
Admission: RE | Admit: 2023-10-30 | Discharge: 2023-10-30 | Disposition: A | Discharge status: Home / Self Care | Source: Ambulatory Visit | Attending: Radiation Oncology | Admitting: Radiation Oncology

## 2023-10-30 ENCOUNTER — Other Ambulatory Visit: Payer: Self-pay

## 2023-10-30 DIAGNOSIS — Z510 Encounter for antineoplastic radiation therapy: Secondary | ICD-10-CM | POA: Diagnosis not present

## 2023-10-30 DIAGNOSIS — C3411 Malignant neoplasm of upper lobe, right bronchus or lung: Secondary | ICD-10-CM | POA: Diagnosis not present

## 2023-10-30 DIAGNOSIS — Z87891 Personal history of nicotine dependence: Secondary | ICD-10-CM | POA: Diagnosis not present

## 2023-10-30 DIAGNOSIS — C3432 Malignant neoplasm of lower lobe, left bronchus or lung: Secondary | ICD-10-CM | POA: Diagnosis not present

## 2023-10-30 LAB — RAD ONC ARIA SESSION SUMMARY
Course Elapsed Days: 10
Plan Fractions Treated to Date: 5
Plan Prescribed Dose Per Fraction: 12 Gy
Plan Total Fractions Prescribed: 5
Plan Total Prescribed Dose: 60 Gy
Reference Point Dosage Given to Date: 60 Gy
Reference Point Session Dosage Given: 12 Gy
Session Number: 9

## 2023-10-31 NOTE — Radiation Completion Notes (Signed)
  Radiation Oncology         [phone] ________________________________  Name: Erica Braun MRN: [id number]  Date of Service: 10/30/2023  DOB: 02-20-1950  End of Treatment Note   Diagnosis:  Stage IA1, cT1aN0M0, NSCLC, squamous cell carcinoma of the RUL with Putative Stage IA1, cT1aN0M0, NSCLC of the LLL   Intent: Curative     ==========DELIVERED PLANS==========  First Treatment Date: 2023-10-20 Last Treatment Date: 2023-10-30   Plan Name: Lung_R_SBRT Site: Lung, Right Technique: SBRT/SRT-IMRT Mode: Photon Dose Per Fraction: 12 Gy Prescribed Dose (Delivered / Prescribed): 60 Gy / 60 Gy Prescribed Fxs (Delivered / Prescribed): 5 / 5   Plan Name: Lung_L_UHRT Site: Lung, Left Technique: IMRT Mode: Photon Dose Per Fraction: 7.5 Gy Prescribed Dose (Delivered / Prescribed): 60 Gy / 60 Gy Prescribed Fxs (Delivered / Prescribed): 8 / 8     ==========ON TREATMENT VISIT DATES========== 2023-10-20, 2023-10-22, 2023-10-23, 2023-10-26, 2023-10-28, 2023-10-30, 2023-10-30 APPENDIX - ON TREATMENT VISIT NOTES==========   See weekly On Treatment Notes in Epic for details in the Media tab (listed as Progress notes on the On Treatment Visit Dates listed above). The patient tolerated radiation.    The patient will receive a call in about one month from the radiation oncology department. She will continue follow up in our clinic with repeat CT in 6-8 weeks. We will also continue to follow an indeterminate nodule in the RML in her surveillance.    Donald KYM Husband, PAC

## 2023-11-02 ENCOUNTER — Ambulatory Visit

## 2023-11-04 ENCOUNTER — Other Ambulatory Visit: Payer: Self-pay | Admitting: Radiation Oncology

## 2023-11-04 DIAGNOSIS — C3411 Malignant neoplasm of upper lobe, right bronchus or lung: Secondary | ICD-10-CM

## 2023-11-04 DIAGNOSIS — C3432 Malignant neoplasm of lower lobe, left bronchus or lung: Secondary | ICD-10-CM

## 2023-11-12 ENCOUNTER — Telehealth: Payer: Self-pay | Admitting: *Deleted

## 2023-11-12 NOTE — Telephone Encounter (Signed)
 CALLED PATIENT TO INFORM OF CT FOR 12-18-23- ARRIVAL TIME- 1:15 PM @ [place] [facility], NO RESTRICTIONS TO SCAN, PATIENT TO BE CALLED WITH RESULTS ON 01-11-24 @ 3 PM BY ALISON PERKINS, SPOKE WITH PATIENT AND SHE IS AWARE OF THESE APPTS. AND THE INSTRUCTIONS

## 2023-11-26 ENCOUNTER — Ambulatory Visit: Admitting: Family

## 2023-11-27 ENCOUNTER — Ambulatory Visit: Payer: Self-pay | Admitting: Family

## 2023-12-18 ENCOUNTER — Ambulatory Visit (HOSPITAL_COMMUNITY)
Admission: RE | Admit: 2023-12-18 | Discharge: 2023-12-18 | Disposition: A | Source: Ambulatory Visit | Attending: Radiation Oncology | Admitting: Radiation Oncology

## 2023-12-18 DIAGNOSIS — C3411 Malignant neoplasm of upper lobe, right bronchus or lung: Secondary | ICD-10-CM

## 2023-12-18 DIAGNOSIS — C3432 Malignant neoplasm of lower lobe, left bronchus or lung: Secondary | ICD-10-CM

## 2023-12-18 LAB — POCT I-STAT CREATININE: Creatinine, Ser: 0.8 mg/dL (ref 0.44–1.00)

## 2023-12-18 MED ORDER — IOHEXOL 300 MG/ML  SOLN
75.0000 mL | Freq: Once | INTRAMUSCULAR | Status: AC | PRN
  Administered 2023-12-18: 75 mL via INTRAVENOUS

## 2023-12-22 ENCOUNTER — Other Ambulatory Visit: Payer: Self-pay | Admitting: Cardiovascular Disease

## 2023-12-28 ENCOUNTER — Ambulatory Visit: Payer: Self-pay

## 2023-12-28 VITALS — BP 137/76 | HR 10 | Ht 61.0 in | Wt 137.0 lb

## 2023-12-28 DIAGNOSIS — Z0000 Encounter for general adult medical examination without abnormal findings: Secondary | ICD-10-CM | POA: Diagnosis not present

## 2023-12-28 DIAGNOSIS — Z13820 Encounter for screening for osteoporosis: Secondary | ICD-10-CM

## 2023-12-28 DIAGNOSIS — Z1231 Encounter for screening mammogram for malignant neoplasm of breast: Secondary | ICD-10-CM

## 2023-12-28 NOTE — Patient Instructions (Signed)
 Ms. Bickle,  Thank you for taking the time for your Medicare Wellness Visit. I appreciate your continued commitment to your health goals. Please review the care plan we discussed, and feel free to reach out if I can assist you further.  Please note that Annual Wellness Visits do not include a physical exam. Some assessments may be limited, especially if the visit was conducted virtually. If needed, we may recommend an in-person follow-up with your provider.  Ongoing Care Seeing your primary care provider every 3 to 6 months helps us  monitor your health and provide consistent, personalized care.   Referrals If a referral was made during today's visit and you haven't received any updates within two weeks, please contact the referred provider directly to check on the status.  Recommended Screenings:  Health Maintenance  Topic Date Due   Zoster (Shingles) Vaccine (1 of 2) Never done   COVID-19 Vaccine (3 - Moderna risk series) 06/07/2019   Breast Cancer Screening  11/07/2022   Medicare Annual Wellness Visit  12/10/2022   Eye exam for diabetics  11/18/2023   Osteoporosis screening with Bone Density Scan  12/11/2023   Flu Shot  04/05/2024*   Hemoglobin A1C  02/25/2024   Yearly kidney health urinalysis for diabetes  04/15/2024   Complete foot exam   04/15/2024   Yearly kidney function blood test for diabetes  08/24/2024   Colon Cancer Screening  03/21/2025   DTaP/Tdap/Td vaccine (2 - Td or Tdap) 11/11/2031   Pneumococcal Vaccine for age over 85  Completed   Hepatitis C Screening  Completed   Meningitis B Vaccine  Aged Out   Screening for Lung Cancer  Discontinued  *Topic was postponed. The date shown is not the original due date.       12/28/2023    9:26 AM  Advanced Directives  Does Patient Have a Medical Advance Directive? No  Would patient like information on creating a medical advance directive? No - Patient declined    Vision: Annual vision screenings are recommended for  early detection of glaucoma, cataracts, and diabetic retinopathy. These exams can also reveal signs of chronic conditions such as diabetes and high blood pressure.  Dental: Annual dental screenings help detect early signs of oral cancer, gum disease, and other conditions linked to overall health, including heart disease and diabetes.  Please see the attached documents for additional preventive care recommendations.

## 2023-12-28 NOTE — Progress Notes (Incomplete)
 Follow up call to discuss results from chest CT scan on 12/18/23.  Nursing interview completed.  IMPRESSION: 1. Mild decrease in size of right upper and left lower lobe pulmonary nodules. 2. No thoracic adenopathy or progressive disease. 3. New or increased subtle tree-in-bud nodularity in the posterior right upper lobe, favoring minimal infectious bronchiolitis. 4. Marked proximal left subclavian artery stenosis. 5. Aortic atherosclerosis (ICD10-I70.0), coronary artery atherosclerosis and emphysema (ICD10-J43.9).

## 2023-12-28 NOTE — Progress Notes (Signed)
 "  No chief complaint on file.    Subjective:   Erica Braun is a 73 y.o. female who presents for a Medicare Annual Wellness Visit.  Visit info / Clinical Intake: Medicare Wellness Visit Type:: Subsequent Annual Wellness Visit Persons participating in visit and providing information:: patient Medicare Wellness Visit Mode:: Telephone If telephone:: video declined Since this visit was completed virtually, some vitals may be partially provided or unavailable. Missing vitals are due to the limitations of the virtual format.: Unable to obtain vitals - no equipment If Telephone or Video please confirm:: I connected with patient using audio/video enable telemedicine. I verified patient identity with two identifiers, discussed telehealth limitations, and patient agreed to proceed. Patient Location:: home Provider Location:: home office Interpreter Needed?: No Pre-visit prep was completed: yes AWV questionnaire completed by patient prior to visit?: no Living arrangements:: lives with spouse/significant other Patient's Overall Health Status Rating: very good Typical amount of pain: none Does pain affect daily life?: no Are you currently prescribed opioids?: no  Dietary Habits and Nutritional Risks How many meals a day?: 3 Eats fruit and vegetables daily?: yes Most meals are obtained by: preparing own meals In the last 2 weeks, have you had any of the following?: none Diabetic:: (!) yes Any non-healing wounds?: no How often do you check your BS?: -- (daily) Would you like to be referred to a Nutritionist or for Diabetic Management? : no  Functional Status Activities of Daily Living (to include ambulation/medication): Independent Ambulation: Independent Medication Administration: Independent Home Management (perform basic housework or laundry): Independent Manage your own finances?: yes Primary transportation is: driving Concerns about vision?: no *vision screening is  required for WTM* (last ov 36yrs ago, will make apt for eye) Concerns about hearing?: no  Fall Screening Falls in the past year?: 0 Number of falls in past year: 0 Was there an injury with Fall?: 0 Fall Risk Category Calculator: 0 Patient Fall Risk Level: Low Fall Risk  Fall Risk Patient at Risk for Falls Due to: No Fall Risks Fall risk Follow up: Falls evaluation completed; Education provided  Home and Transportation Safety: All rugs have non-skid backing?: yes All stairs or steps have railings?: yes Grab bars in the bathtub or shower?: (!) no Have non-skid surface in bathtub or shower?: yes Good home lighting?: yes Regular seat belt use?: yes Hospital stays in the last year:: (!) yes How many hospital stays:: 2 Reason: flu  Cognitive Assessment Difficulty concentrating, remembering, or making decisions? : no Will 6CIT or Mini Cog be Completed: yes What year is it?: 0 points What month is it?: 0 points Give patient an address phrase to remember (5 components): [address] [place], [place] [address] About what time is it?: 0 points Count backwards from 20 to 1: 0 points Say the months of the year in reverse: 0 points Repeat the address phrase from earlier: 0 points 6 CIT Score: 0 points  Advance Directives (For Healthcare) Does Patient Have a Medical Advance Directive?: No Would patient like information on creating a medical advance directive?: No - Patient declined  Reviewed/Updated  Reviewed/Updated: Reviewed All (Medical, Surgical, Family, Medications, Allergies, Care Teams, Patient Goals); Medical History; Surgical History; Family History; Medications; Allergies; Care Teams; Patient Goals    Allergies (verified) Patient has no known allergies.   Current Medications (verified) Outpatient Encounter Medications as of 12/28/2023  Medication Sig   acetaminophen  (TYLENOL ) 500 MG tablet Take 500 mg by mouth every 6 (six) hours as needed for mild  pain (pain score 1-3).    ANORO ELLIPTA  62.5-25 MCG/ACT AEPB Inhale 1 puff by mouth once daily   atenolol  (TENORMIN ) 25 MG tablet Take 0.5 tablets (12.5 mg total) by mouth 2 (two) times daily.   calcium  carbonate (OSCAL) 1500 (600 Ca) MG TABS tablet Take 600 mg by mouth in the morning and at bedtime.   empagliflozin  (JARDIANCE ) 25 MG TABS tablet Take 1 tablet (25 mg total) by mouth daily before breakfast.   famotidine  (PEPCID ) 40 MG tablet Take 1 tablet (40 mg total) by mouth at bedtime.   ferrous sulfate  325 (65 FE) MG EC tablet Take 1 tablet (325 mg total) by mouth daily with breakfast.   glimepiride  (AMARYL ) 2 MG tablet Take 1 tablet (2 mg total) by mouth daily as needed (blood sugar more than 200.).   ipratropium-albuterol  (DUONEB) 0.5-2.5 (3) MG/3ML SOLN Take 3 mLs by nebulization every 8 (eight) hours.   isosorbide  mononitrate (IMDUR ) 30 MG 24 hr tablet Take 0.5 tablets (15 mg total) by mouth daily.   metFORMIN  (GLUCOPHAGE ) 1000 MG tablet Take 1 tablet (1,000 mg total) by mouth 2 (two) times daily with a meal.   Multiple Vitamins-Minerals (ADULT ONE DAILY GUMMIES PO) Take 2 tablets by mouth in the morning. Women's Multi by [employer]   nitroGLYCERIN  (NITROSTAT ) 0.4 MG SL tablet Place 1 tablet (0.4 mg total) under the tongue every 5 (five) minutes as needed for chest pain.   omeprazole  (PRILOSEC) 40 MG capsule Take 1 capsule (40 mg total) by mouth daily.   rosuvastatin  (CRESTOR ) 10 MG tablet Take 1 tablet by mouth once daily   No facility-administered encounter medications on file as of 12/28/2023.    History: Past Medical History:  Diagnosis Date   Complication of anesthesia    COPD (chronic obstructive pulmonary disease) (HCC)    Coronary artery disease    Diabetes mellitus without complication (HCC)    Diverticulosis    Early cataracts, bilateral 11/21/2016   Esophageal cancer (HCC)    GERD (gastroesophageal reflux disease)    Glaucoma    patient denies   Hyperlipidemia    Hypertension    Internal  hemorrhoids    Osteoporosis 12/16/2021   Pneumonia    PONV (postoperative nausea and vomiting)    Sleep apnea    uses Cpap   Spasm of the cricopharyngeus muscle    Squamous cell esophageal cancer (HCC) 05/10/2018   Past Surgical History:  Procedure Laterality Date   ABDOMINAL HYSTERECTOMY     cervical dysplasia   BIOPSY  05/03/2018   Procedure: BIOPSY;  Surgeon: Wilhelmenia Aloha Raddle., MD;  [facility] ENDOSCOPY;  Service: Gastroenterology;;   BREAST BIOPSY Left    BREAST BIOPSY Right 11/27/2021   MM RT BREAST BX W LOC DEV 1ST LESION IMAGE BX SPEC STEREO GUIDE 11/27/2021 [facility] MAMMOGRAPHY   BREAST BIOPSY  01/14/2022   MM RT RADIOACTIVE SEED LOC MAMMO GUIDE 01/14/2022 [facility] M[facility]MOGR[facility]   BREAST LUMPECTOMY Left    BREAST LUMPECTOMY WITH RADIOACTIVE SEED LOCALIZATION Right 01/15/2022   Procedure: RIGHT BREAST LUMPECTOMY WITH RADIOACTIVE SEED LOCALIZATION;  Surgeon: [name] Ned, MD;  Location: [facility] OR;  Service: General;  Laterality: Right;   ESOPHAGOGASTRODUODENOSCOPY (EGD) WITH PROPOFOL  N/A 05/03/2018   Procedure: ESOPHAGOGASTRODUODENOSCOPY (EGD) WITH PROPOFOL ;  Surgeon: Wilhelmenia Aloha Raddle., MD;  Location: [facility] ENDOSCOPY;  Service: Gastroenterology;  Laterality: N/A;   ESOPHAGOGASTRODUODENOSCOPY (EGD) WITH PROPOFOL  N/A 11/18/2018   Procedure: ESOPHAGOGASTRODUODENOSCOPY (EGD) WITH PROPOFOL ;  Surgeon: Abran Norleen SAILOR, MD;  Location: [facility] ENDOSCOPY;  Service:  Endoscopy;  Laterality: N/A;   EUS  05/03/2018   Procedure: UPPER ENDOSCOPIC ULTRASOUND (EUS) RADIAL;  Surgeon: Wilhelmenia Aloha Raddle., MD;  Location: [facility] ENDOSCOPY;  Service: Gastroenterology;;   FOREIGN BODY REMOVAL  11/18/2018   Procedure: FOREIGN BODY REMOVAL;  Surgeon: Abran Norleen SAILOR, MD;  Location: [facility] ENDOSCOPY;  Service: Endoscopy;;   LEFT HEART CATH AND CORONARY ANGIOGRAPHY N/A 06/11/2017   Procedure: LEFT HEART CATH AND CORONARY ANGIOGRAPHY;  Surgeon: [name] Victory ORN, MD;  Location: [facility] INVASIVE CV LAB;  Service: Cardiovascular;   Laterality: N/A;   VIDEO BRONCHOSCOPY WITH ENDOBRONCHIAL NAVIGATION Right 09/08/2023   Procedure: VIDEO BRONCHOSCOPY WITH ENDOBRONCHIAL NAVIGATION;  Surgeon: Shelah Lamar RAMAN, MD;  Location: [facility] ENDOSCOPY;  Service: Pulmonary;  Laterality: Right;   Family History  Problem Relation Age of Onset   Breast cancer Mother        dx 73s; mets   Arthritis Mother    COPD Father 91       emphysema   Alcohol abuse Father    Diabetes Father    Breast cancer Sister        dx late 41s; mat half sister   Early death Brother        overdose   Drug abuse Brother    Alcohol abuse Paternal Aunt    COPD Paternal Aunt    Throat cancer Paternal Uncle    Liver cancer Maternal Grandmother        or other primary; d. late 88s   Diabetes Maternal Grandmother    Alcohol abuse Maternal Grandfather    Early death Maternal Grandfather    Stroke Paternal Grandmother    Heart disease Paternal Grandfather    Esophageal cancer Neg Hx    Stomach cancer Neg Hx    Colon cancer Neg Hx    Social History   Occupational History   Occupation: retired    Comment: radiation protection practitioner  Tobacco Use   Smoking status: Former    Current packs/day: 0.00    Average packs/day: 1.5 packs/day for 41.0 years (61.5 ttl pk-yrs)    Types: Cigarettes    Start date: 41    Quit date: 2010    Years since quitting: 15.9    Passive exposure: Past   Smokeless tobacco: Never  Vaping Use   Vaping status: Never Used  Substance and Sexual Activity   Alcohol use: Not Currently    Comment: wine occasionaly   Drug use: No   Sexual activity: Yes    Birth control/protection: Post-menopausal   Tobacco Counseling Counseling given: Not Answered  SDOH Screenings   Food Insecurity: No Food Insecurity (12/28/2023)  Housing: Low Risk (12/28/2023)  Transportation Needs: No Transportation Needs (12/28/2023)  Utilities: Not At Risk (12/28/2023)  Alcohol Screen: Low Risk (03/26/2023)  Depression (PHQ2-9): Low Risk (12/28/2023)  Financial  Resource Strain: Low Risk (03/26/2023)  Physical Activity: Insufficiently Active (12/28/2023)  Social Connections: Socially Integrated (12/28/2023)  Stress: No Stress Concern Present (12/28/2023)  Tobacco Use: Medium Risk (12/28/2023)  Health Literacy: Adequate Health Literacy (12/28/2023)   See flowsheets for full screening details  Depression Screen PHQ 2 & 9 Depression Scale- Over the past 2 weeks, how often have you been bothered by any of the following problems? Little interest or pleasure in doing things: 0 Feeling down, depressed, or hopeless (PHQ Adolescent also includes...irritable): 0 PHQ-2 Total Score: 0 Trouble falling or staying asleep, or sleeping too much: 1 Feeling tired or having little energy: 0 Poor appetite or overeating (PHQ Adolescent  also includes...weight loss): 0 Feeling bad about yourself - or that you are a failure or have let yourself or your family down: 0 Trouble concentrating on things, such as reading the newspaper or watching television (PHQ Adolescent also includes...like school work): 0 Moving or speaking so slowly that other people could have noticed. Or the opposite - being so fidgety or restless that you have been moving around a lot more than usual: 0 Thoughts that you would be better off dead, or of hurting yourself in some way: 0 PHQ-9 Total Score: 1 If you checked off any problems, how difficult have these problems made it for you to do your work, take care of things at home, or get along with other people?: Not difficult at all  Depression Treatment Depression Interventions/Treatment : [id number] Score <4 Follow-up Not Indicated     Goals Addressed             This Visit's Progress    Exercise 3x per week (30 min per time)   On track    Increase walking.              Objective:    There were no vitals filed for this visit. There is no height or weight on file to calculate BMI.  Hearing/Vision screen No results  found. Immunizations and Health Maintenance Health Maintenance  Topic Date Due   Zoster Vaccines- Shingrix (1 of 2) Never done   COVID-19 Vaccine (3 - Moderna risk series) 06/07/2019   Mammogram  11/07/2022   OPHTHALMOLOGY EXAM  11/18/2023   Bone Density Scan  12/11/2023   Influenza Vaccine  04/05/2024 (Originally 08/07/2023)   HEMOGLOBIN A1C  02/25/2024   Diabetic kidney evaluation - Urine ACR  04/15/2024   FOOT EXAM  04/15/2024   Diabetic kidney evaluation - eGFR measurement  08/24/2024   Medicare Annual Wellness (AWV)  12/27/2024   Colonoscopy  03/21/2025   DTaP/Tdap/Td (2 - Td or Tdap) 11/11/2031   Pneumococcal Vaccine: 50+ Years  Completed   Hepatitis C Screening  Completed   Meningococcal B Vaccine  Aged Out   Lung Cancer Screening  Discontinued        Assessment/Plan:  This is a routine wellness examination for Alicea.  Patient Care Team: [name], [name] as PCP - General (Family Medicine) [name] Maude BROCKS, MD as PCP - Cardiology (Cardiology) Pyrtle, Gordy HERO, MD as Consulting Physician (Gastroenterology) [name], MD as Consulting Physician (Pulmonary Disease)  I have personally reviewed and noted the following in the patients chart:   Medical and social history Use of alcohol, tobacco or illicit drugs  Current medications and supplements including opioid prescriptions. Functional ability and status Nutritional status Physical activity Advanced directives List of other physicians Hospitalizations, surgeries, and ER visits in previous 12 months Vitals Screenings to include cognitive, depression, and falls Referrals and appointments  No orders of the defined types were placed in this encounter.  In addition, I have reviewed and discussed with patient certain preventive protocols, quality metrics, and best practice recommendations. A written personalized care plan for preventive services as well as general preventive health recommendations were provided to  patient.   Ozie Ned, CMA   12/28/2023   Return in 1 year (on 12/27/2024).  After Visit Summary: (MyChart) Due to this being a telephonic visit, the after visit summary with patients personalized plan was offered to patient via MyChart   Nurse Notes:HM Addressed: DEXA ordered/Mammogram oredered, pt aware need update on Diabetic Eye Exam,  Pt declined Covid and shingles vaccines, per pt will get the flu "

## 2024-01-04 ENCOUNTER — Telehealth: Payer: Self-pay | Admitting: Radiation Oncology

## 2024-01-04 DIAGNOSIS — C3411 Malignant neoplasm of upper lobe, right bronchus or lung: Secondary | ICD-10-CM

## 2024-01-04 DIAGNOSIS — C3432 Malignant neoplasm of lower lobe, left bronchus or lung: Secondary | ICD-10-CM

## 2024-01-04 NOTE — Telephone Encounter (Signed)
 I called and reviewed the patient's post treatment CT scan results. She is doing well without symptoms of respiratory changes fevers or productive cough. We will plan to repeat CT in 6 months and begin continued surveillance.

## 2024-01-11 ENCOUNTER — Ambulatory Visit: Admitting: Radiation Oncology

## 2024-01-31 ENCOUNTER — Other Ambulatory Visit: Payer: Self-pay | Admitting: Pulmonary Disease

## 2024-02-04 ENCOUNTER — Other Ambulatory Visit: Payer: Self-pay

## 2024-02-04 MED ORDER — ANORO ELLIPTA 62.5-25 MCG/ACT IN AEPB: 1.0000 | INHALATION_SPRAY | Freq: Every day | RESPIRATORY_TRACT | 3 refills | Status: AC

## 2024-02-10 ENCOUNTER — Encounter (HOSPITAL_COMMUNITY): Payer: Self-pay

## 2024-02-10 ENCOUNTER — Ambulatory Visit (HOSPITAL_COMMUNITY)
Admission: RE | Admit: 2024-02-10 | Discharge: 2024-02-10 | Disposition: A | Source: Ambulatory Visit | Attending: Family | Admitting: Family

## 2024-02-10 DIAGNOSIS — Z1231 Encounter for screening mammogram for malignant neoplasm of breast: Secondary | ICD-10-CM

## 2024-02-10 DIAGNOSIS — Z0000 Encounter for general adult medical examination without abnormal findings: Secondary | ICD-10-CM

## 2024-06-27 ENCOUNTER — Ambulatory Visit: Admitting: Radiation Oncology
